# Patient Record
Sex: Male | Born: 1990 | Race: White | Hispanic: No | Marital: Single | Smoking: Current every day smoker
Health system: Southern US, Community
[De-identification: ages and names within clinical notes are randomized; demographics above are authoritative.]

## PROBLEM LIST (undated history)

## (undated) DIAGNOSIS — F419 Anxiety disorder, unspecified: Secondary | ICD-10-CM

## (undated) DIAGNOSIS — F32A Depression, unspecified: Secondary | ICD-10-CM

## (undated) DIAGNOSIS — F329 Major depressive disorder, single episode, unspecified: Secondary | ICD-10-CM

## (undated) DIAGNOSIS — K219 Gastro-esophageal reflux disease without esophagitis: Secondary | ICD-10-CM

## (undated) DIAGNOSIS — F4310 Post-traumatic stress disorder, unspecified: Secondary | ICD-10-CM

## (undated) DIAGNOSIS — T7840XA Allergy, unspecified, initial encounter: Secondary | ICD-10-CM

## (undated) DIAGNOSIS — F319 Bipolar disorder, unspecified: Secondary | ICD-10-CM

## (undated) DIAGNOSIS — K922 Gastrointestinal hemorrhage, unspecified: Secondary | ICD-10-CM

## (undated) DIAGNOSIS — R45851 Suicidal ideations: Secondary | ICD-10-CM

## (undated) HISTORY — DX: Allergy, unspecified, initial encounter: T78.40XA

## (undated) HISTORY — PX: NO PAST SURGERIES: SHX2092

---
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
## 2010-01-13 ENCOUNTER — Inpatient Hospital Stay: Payer: Self-pay | Admitting: Psychiatry

## 2011-04-04 ENCOUNTER — Emergency Department: Payer: Self-pay | Admitting: Emergency Medicine

## 2011-05-02 ENCOUNTER — Inpatient Hospital Stay: Payer: Self-pay | Admitting: Psychiatry

## 2011-11-08 ENCOUNTER — Emergency Department: Payer: Self-pay | Admitting: Emergency Medicine

## 2012-05-02 ENCOUNTER — Emergency Department: Payer: Self-pay | Admitting: Emergency Medicine

## 2012-08-28 ENCOUNTER — Emergency Department: Payer: Self-pay | Admitting: Emergency Medicine

## 2013-01-09 ENCOUNTER — Emergency Department: Payer: Self-pay | Admitting: Emergency Medicine

## 2013-03-14 ENCOUNTER — Emergency Department: Payer: Self-pay | Admitting: Emergency Medicine

## 2013-03-14 LAB — URINALYSIS, COMPLETE
Glucose,UR: NEGATIVE mg/dL (ref 0–75)
Nitrite: NEGATIVE
Ph: 5 (ref 4.5–8.0)
Squamous Epithelial: <1

## 2013-05-22 ENCOUNTER — Emergency Department: Payer: Self-pay | Admitting: Emergency Medicine

## 2013-10-12 ENCOUNTER — Emergency Department: Payer: Self-pay | Admitting: Emergency Medicine

## 2013-10-12 LAB — COMPREHENSIVE METABOLIC PANEL
Albumin: 4.5 g/dL (ref 3.4–5.0)
Alkaline Phosphatase: 103 U/L (ref 50–136)
Anion Gap: 8 (ref 7–16)
BUN: 14 mg/dL (ref 7–18)
Bilirubin,Total: 0.5 mg/dL (ref 0.2–1.0)
Calcium, Total: 9.7 mg/dL (ref 8.5–10.1)
Chloride: 106 mmol/L (ref 98–107)
Co2: 24 mmol/L (ref 21–32)
Creatinine: 1.06 mg/dL (ref 0.60–1.30)
EGFR (Non-African Amer.): >60
SGOT(AST): 19 U/L (ref 15–37)
SGPT (ALT): 19 U/L (ref 12–78)
Total Protein: 8.5 g/dL — ABNORMAL HIGH (ref 6.4–8.2)

## 2013-10-12 LAB — GASTROCCULT (ARMC): Ph, Gastric: >7 (ref 1–3)

## 2013-10-12 LAB — CBC
HCT: 45.4 % (ref 40.0–52.0)
HGB: 15.9 g/dL (ref 13.0–18.0)
MCH: 33 pg (ref 26.0–34.0)
MCH: 33 pg (ref 26.0–34.0)
MCHC: 35 g/dL (ref 32.0–36.0)
Platelet: 176 10*3/uL (ref 150–440)
RBC: 4.81 10*6/uL (ref 4.40–5.90)
RBC: 4.34 10*6/uL — ABNORMAL LOW (ref 4.40–5.90)
RDW: 13.2 % (ref 11.5–14.5)
WBC: 7.4 10*3/uL (ref 3.8–10.6)
WBC: 9.3 10*3/uL (ref 3.8–10.6)

## 2013-10-12 LAB — LIPASE, BLOOD: Lipase: 71 U/L — ABNORMAL LOW (ref 73–393)

## 2013-10-27 ENCOUNTER — Emergency Department: Payer: Self-pay | Admitting: Emergency Medicine

## 2013-10-27 LAB — CBC
HCT: 45.3 % (ref 40.0–52.0)
HGB: 15.7 g/dL (ref 13.0–18.0)
MCH: 32.6 pg (ref 26.0–34.0)
MCHC: 34.7 g/dL (ref 32.0–36.0)
MCV: 94 fL (ref 80–100)
RBC: 4.82 10*6/uL (ref 4.40–5.90)
RDW: 12.6 % (ref 11.5–14.5)

## 2013-10-27 LAB — COMPREHENSIVE METABOLIC PANEL
Albumin: 4.2 g/dL (ref 3.4–5.0)
Alkaline Phosphatase: 102 U/L (ref 50–136)
BUN: 16 mg/dL (ref 7–18)
Bilirubin,Total: 0.3 mg/dL (ref 0.2–1.0)
Co2: 31 mmol/L (ref 21–32)
Creatinine: 1 mg/dL (ref 0.60–1.30)
Glucose: 86 mg/dL (ref 65–99)
Osmolality: 272 (ref 275–301)
Potassium: 3.6 mmol/L (ref 3.5–5.1)
SGOT(AST): 20 U/L (ref 15–37)
SGPT (ALT): 18 U/L (ref 12–78)
Sodium: 136 mmol/L (ref 136–145)
Total Protein: 8.1 g/dL (ref 6.4–8.2)

## 2013-10-27 LAB — LIPASE, BLOOD: Lipase: 101 U/L (ref 73–393)

## 2013-10-28 LAB — URINALYSIS, COMPLETE
Blood: NEGATIVE
Glucose,UR: NEGATIVE mg/dL (ref 0–75)
Leukocyte Esterase: NEGATIVE
Ph: 5 (ref 4.5–8.0)
Protein: 30
RBC,UR: <1 /HPF (ref 0–5)
Specific Gravity: 1.034 (ref 1.003–1.030)
WBC UR: 1 /HPF (ref 0–5)

## 2014-01-05 ENCOUNTER — Emergency Department: Payer: Self-pay | Admitting: Emergency Medicine

## 2014-01-05 LAB — BASIC METABOLIC PANEL
Anion Gap: 3 — ABNORMAL LOW (ref 7–16)
BUN: 16 mg/dL (ref 7–18)
CHLORIDE: 106 mmol/L (ref 98–107)
CO2: 31 mmol/L (ref 21–32)
Calcium, Total: 9.4 mg/dL (ref 8.5–10.1)
Creatinine: 0.98 mg/dL (ref 0.60–1.30)
EGFR (African American): >60
GLUCOSE: 65 mg/dL (ref 65–99)
OSMOLALITY: 279 (ref 275–301)
POTASSIUM: 3.7 mmol/L (ref 3.5–5.1)
Sodium: 140 mmol/L (ref 136–145)

## 2014-01-05 LAB — CBC
HCT: 44.7 % (ref 40.0–52.0)
HGB: 15.1 g/dL (ref 13.0–18.0)
MCH: 31.3 pg (ref 26.0–34.0)
MCHC: 33.7 g/dL (ref 32.0–36.0)
MCV: 93 fL (ref 80–100)
Platelet: 224 10*3/uL (ref 150–440)
RBC: 4.82 10*6/uL (ref 4.40–5.90)
RDW: 13 % (ref 11.5–14.5)
WBC: 5.5 10*3/uL (ref 3.8–10.6)

## 2014-01-05 LAB — MONONUCLEOSIS SCREEN: MONO TEST: NEGATIVE

## 2014-04-02 ENCOUNTER — Encounter (HOSPITAL_COMMUNITY): Payer: Self-pay | Admitting: Emergency Medicine

## 2014-04-02 ENCOUNTER — Emergency Department (HOSPITAL_COMMUNITY)
Admission: EM | Admit: 2014-04-02 | Discharge: 2014-04-02 | Discharge status: Against Medical Advice | Payer: Self-pay | Attending: Emergency Medicine | Admitting: Emergency Medicine

## 2014-04-02 DIAGNOSIS — Z8659 Personal history of other mental and behavioral disorders: Secondary | ICD-10-CM | POA: Insufficient documentation

## 2014-04-02 DIAGNOSIS — F17200 Nicotine dependence, unspecified, uncomplicated: Secondary | ICD-10-CM | POA: Insufficient documentation

## 2014-04-02 DIAGNOSIS — K219 Gastro-esophageal reflux disease without esophagitis: Secondary | ICD-10-CM | POA: Insufficient documentation

## 2014-04-02 DIAGNOSIS — R42 Dizziness and giddiness: Secondary | ICD-10-CM | POA: Insufficient documentation

## 2014-04-02 DIAGNOSIS — Z79899 Other long term (current) drug therapy: Secondary | ICD-10-CM | POA: Insufficient documentation

## 2014-04-02 HISTORY — DX: Anxiety disorder, unspecified: F41.9

## 2014-04-02 HISTORY — DX: Bipolar disorder, unspecified: F31.9

## 2014-04-02 HISTORY — DX: Gastro-esophageal reflux disease without esophagitis: K21.9

## 2014-04-02 HISTORY — DX: Depression, unspecified: F32.A

## 2014-04-02 HISTORY — DX: Gastrointestinal hemorrhage, unspecified: K92.2

## 2014-04-02 HISTORY — DX: Post-traumatic stress disorder, unspecified: F43.10

## 2014-04-02 HISTORY — DX: Major depressive disorder, single episode, unspecified: F32.9

## 2014-04-02 LAB — COMPREHENSIVE METABOLIC PANEL
ALT: 21 U/L (ref 0–53)
AST: 23 U/L (ref 0–37)
Albumin: 4.2 g/dL (ref 3.5–5.2)
Alkaline Phosphatase: 81 U/L (ref 39–117)
BUN: 18 mg/dL (ref 6–23)
CALCIUM: 9.3 mg/dL (ref 8.4–10.5)
CO2: 24 mEq/L (ref 19–32)
CREATININE: 0.92 mg/dL (ref 0.50–1.35)
Chloride: 102 mEq/L (ref 96–112)
GFR calc Af Amer: >90 mL/min (ref >90)
GLUCOSE: 80 mg/dL (ref 70–99)
Potassium: 3.7 mEq/L (ref 3.7–5.3)
Sodium: 140 mEq/L (ref 137–147)
TOTAL PROTEIN: 7.4 g/dL (ref 6.0–8.3)
Total Bilirubin: 0.2 mg/dL — ABNORMAL LOW (ref 0.3–1.2)

## 2014-04-02 LAB — CBC WITH DIFFERENTIAL/PLATELET
Basophils Absolute: 0 10*3/uL (ref 0.0–0.1)
Basophils Relative: 0 % (ref 0–1)
EOS PCT: 3 % (ref 0–5)
Eosinophils Absolute: 0.2 10*3/uL (ref 0.0–0.7)
HEMATOCRIT: 42.3 % (ref 39.0–52.0)
Hemoglobin: 14.6 g/dL (ref 13.0–17.0)
LYMPHS ABS: 1.4 10*3/uL (ref 0.7–4.0)
Lymphocytes Relative: 22 % (ref 12–46)
MCH: 31.5 pg (ref 26.0–34.0)
MCHC: 34.5 g/dL (ref 30.0–36.0)
MCV: 91.2 fL (ref 78.0–100.0)
MONO ABS: 0.7 10*3/uL (ref 0.1–1.0)
Monocytes Relative: 11 % (ref 3–12)
Neutro Abs: 4.2 10*3/uL (ref 1.7–7.7)
Neutrophils Relative %: 64 % (ref 43–77)
PLATELETS: 202 10*3/uL (ref 150–400)
RBC: 4.64 MIL/uL (ref 4.22–5.81)
RDW: 13.1 % (ref 11.5–15.5)
WBC: 6.6 10*3/uL (ref 4.0–10.5)

## 2014-04-02 LAB — I-STAT TROPONIN, ED: Troponin i, poc: 0 ng/mL (ref 0.00–0.08)

## 2014-04-02 LAB — LIPASE, BLOOD: Lipase: 22 U/L (ref 11–59)

## 2014-04-02 MED ORDER — SODIUM CHLORIDE 0.9 % IV BOLUS (SEPSIS): 1000.0000 mL | Freq: Once | INTRAVENOUS | Status: DC

## 2014-04-02 MED ORDER — ONDANSETRON HCL 4 MG PO TABS: 4.0000 mg | ORAL_TABLET | Freq: Four times a day (QID) | ORAL | Status: DC

## 2014-04-02 MED ORDER — ONDANSETRON 4 MG PO TBDP: 4.0000 mg | ORAL_TABLET | Freq: Once | ORAL | Status: DC

## 2014-04-02 MED ORDER — FAMOTIDINE 20 MG PO TABS: 20.0000 mg | ORAL_TABLET | Freq: Two times a day (BID) | ORAL | Status: DC

## 2014-04-02 NOTE — ED Notes (Addendum)
Pt c/o intermittent acid reflux since October 2014 and symptoms have become constant x 4 days.  Pain score 3/10.  Hx of upper and lower GI bleed.  Pt reports that he has been seen multiple times for same at [facility].

## 2014-04-02 NOTE — ED Provider Notes (Signed)
CSN: [id number]     Arrival date & time 04/02/14  1502 History   First MD Initiated Contact with Patient 04/02/14 1512     Chief Complaint  Patient presents with  . Gastrophageal Reflux     (Consider location/radiation/quality/duration/timing/severity/associated sxs/prior Treatment) The history is provided by the patient. No language interpreter was used.  Carlos French is a 23 y/o M with PMhx of abdominal pain, nausea, vomiting that has been ongoing since October 2014 intermittent that has now increased in intensity and frequency over the past couple of weeks. Patient reported that he has been feeling nauseous every morning, mother reported that patient has been vomiting every morning and evening. Patient reported that his emesis is mainly a foam yellowish color. Reported that he has been having a burning sensation in the center of his chest associated after eating. Patient reported that his diet is not the best diet - reported that it mainly consists of foods high in fat, grease, spice, soda (Pepsi). Reported that he is unable to keep down food or fluids. Reported that he has been having abdominal pain intermittently since October 2014 - reported that the discomfort is localized underneath his ribcage. Reported that he has been experiencing a gurgling sensation intermittently. Reported that he was seen and assessed in the ED setting in October of 2014 where he was diagnosed with a upper GI bleed, was discharged and recommended to follow-up with GI as outpatient, but patient does not have insurance. Patient reported that he has been taking Zegerid as needed with minimal relief. Denied blood or bile in the emesis, fever, chills, shortness of breath, difficulty breathing, chest pain, melena, hematochezia.  PCP none  Past Medical History  Diagnosis Date  . GI bleeding   . GERD (gastroesophageal reflux disease)   . Anxiety   . Depression   . PTSD (post-traumatic stress disorder)   . Bipolar 1  disorder    History reviewed. No pertinent past surgical history. History reviewed. No pertinent family history. History  Substance Use Topics  . Smoking status: Current Every Day Smoker -- 0.75 packs/day    Types: Cigarettes  . Smokeless tobacco: Not on file  . Alcohol Use: No    Review of Systems  Constitutional: Negative for fever and chills.  HENT: Negative for trouble swallowing.   Respiratory: Negative for chest tightness and shortness of breath.   Cardiovascular: Positive for chest pain.  Gastrointestinal: Positive for nausea, vomiting and abdominal pain. Negative for diarrhea, constipation, blood in stool and anal bleeding.  Genitourinary: Negative for hematuria and decreased urine volume.  Musculoskeletal: Negative for back pain and neck pain.  Neurological: Positive for dizziness. Negative for syncope, weakness and light-headedness.  All other systems reviewed and are negative.     Allergies  Review of patient's allergies indicates not on file.  Home Medications   Current Outpatient Rx  Name  Route  Sig  Dispense  Refill  . Omeprazole-Sodium Bicarbonate (ZEGERID OTC PO)   Oral   Take 1 capsule by mouth once. Took for acid reflux         . famotidine (PEPCID) 20 MG tablet   Oral   Take 1 tablet (20 mg total) by mouth 2 (two) times daily.   30 tablet   0   . ondansetron (ZOFRAN) 4 MG tablet   Oral   Take 1 tablet (4 mg total) by mouth every 6 (six) hours.   12 tablet   0    BP 118/67  Pulse 86  Temp(Src) 98.6 F (37 C) (Oral)  Resp 18  SpO2 99% Physical Exam  Nursing note and vitals reviewed. Constitutional: He is oriented to person, place, and time. He appears well-developed and well-nourished. No distress.  HENT:  Head: Normocephalic and atraumatic.  Mouth/Throat: Oropharynx is clear and moist. No oropharyngeal exudate.  Eyes: Conjunctivae and EOM are normal. Pupils are equal, round, and reactive to light. Right eye exhibits no discharge. Left  eye exhibits no discharge.  Neck: Normal range of motion. Neck supple.  Cardiovascular: Normal rate, regular rhythm and normal heart sounds.   Pulmonary/Chest: Effort normal and breath sounds normal. No respiratory distress. He has no wheezes. He has no rales.  Abdominal: Soft. Bowel sounds are normal. There is no tenderness. There is no guarding.  Soft upon palpation Negative Murphy's sign Negative McBurney's point  Musculoskeletal: Normal range of motion.  Full ROM to upper and lower extremities without difficulty noted, negative ataxia noted.  Neurological: He is alert and oriented to person, place, and time. No cranial nerve deficit. He exhibits normal muscle tone. Coordination normal.  Skin: Skin is warm and dry. No rash noted. He is not diaphoretic. No erythema.  Psychiatric: He has a normal mood and affect. His behavior is normal. Thought content normal.    ED Course  Procedures (including critical care time)  5:21 PM Patient refused fecal occult stool card to rule out possible lower GI bleed since patient has history of ulcers. Discussed with patient the importance and concern - patient continued to refuse the exam.   5:38 PM This provider was made aware that the patient is refusing IV and CT abdomen and pelvis from nurse.   7:16 PM This provider had a long discussion with patient regarding why labs and imaging were ordered. Discussed concern for possible GI bleed and fatality - patient continued to refuse imaging and exam. Patient to be signed out AMA.   Results for orders placed during the hospital encounter of 04/02/14  CBC WITH DIFFERENTIAL      Result Value Ref Range   WBC 6.6  4.0 - 10.5 K/uL   RBC 4.64  4.22 - 5.81 MIL/uL   Hemoglobin 14.6  13.0 - 17.0 g/dL   HCT 16.1  09.6 - 04.5 %   MCV 91.2  78.0 - 100.0 fL   MCH 31.5  26.0 - 34.0 pg   MCHC 34.5  30.0 - 36.0 g/dL   RDW 40.9  81.1 - 91.4 %   Platelets 202  150 - 400 K/uL   Neutrophils Relative % 64  43 - 77 %    Neutro Abs 4.2  1.7 - 7.7 K/uL   Lymphocytes Relative 22  12 - 46 %   Lymphs Abs 1.4  0.7 - 4.0 K/uL   Monocytes Relative 11  3 - 12 %   Monocytes Absolute 0.7  0.1 - 1.0 K/uL   Eosinophils Relative 3  0 - 5 %   Eosinophils Absolute 0.2  0.0 - 0.7 K/uL   Basophils Relative 0  0 - 1 %   Basophils Absolute 0.0  0.0 - 0.1 K/uL  COMPREHENSIVE METABOLIC PANEL      Result Value Ref Range   Sodium 140  137 - 147 mEq/L   Potassium 3.7  3.7 - 5.3 mEq/L   Chloride 102  96 - 112 mEq/L   CO2 24  19 - 32 mEq/L   Glucose, Bld 80  70 - 99 mg/dL   BUN  18  6 - 23 mg/dL   Creatinine, Ser 1.61  0.50 - 1.35 mg/dL   Calcium 9.3  8.4 - 09.6 mg/dL   Total Protein 7.4  6.0 - 8.3 g/dL   Albumin 4.2  3.5 - 5.2 g/dL   AST 23  0 - 37 U/L   ALT 21  0 - 53 U/L   Alkaline Phosphatase 81  39 - 117 U/L   Total Bilirubin 0.2 (*) 0.3 - 1.2 mg/dL   GFR calc non Af Amer >90  >90 mL/min   GFR calc Af Amer >90  >90 mL/min  LIPASE, BLOOD      Result Value Ref Range   Lipase 22  11 - 59 U/L  I-STAT TROPOININ, ED      Result Value Ref Range   Troponin i, poc 0.00  0.00 - 0.08 ng/mL   Comment 3             Labs Review Labs Reviewed  COMPREHENSIVE METABOLIC PANEL - Abnormal; Notable for the following:    Total Bilirubin 0.2 (*)    All other components within normal limits  CBC WITH DIFFERENTIAL  LIPASE, BLOOD  URINALYSIS, ROUTINE W REFLEX MICROSCOPIC  OCCULT BLOOD X 1 CARD TO LAB, STOOL  I-STAT TROPOININ, ED   Imaging Review No results found.   EKG Interpretation   Date/Time:  Thursday April 02 2014 16:51:56 EDT Ventricular Rate:  74 PR Interval:  152 QRS Duration: 106 QT Interval:  366 QTC Calculation: 406 R Axis:   74 Text Interpretation:  Sinus rhythm Non-specific ST-t changes No old  tracing to compare Confirmed by KOHUT  MD, STEPHEN [id number]) on 04/02/2014  4:59:24 PM      MDM   Final diagnoses:  GERD (gastroesophageal reflux disease)   Medications  sodium chloride 0.9 % bolus 1,000 mL  (1,000 mLs Intravenous Not Given 04/02/14 1737)  ondansetron (ZOFRAN-ODT) disintegrating tablet 4 mg (4 mg Oral Not Given 04/02/14 1803)   Filed Vitals:   04/02/14 1514 04/02/14 1829 04/02/14 1831  BP: 113/61 105/48 118/67  Pulse: 100 86   Temp: 98.2 F (36.8 C) 98.6 F (37 C)   TempSrc: Oral Oral   Resp: 20 18   SpO2: 100% 99%     Patient presenting to the ED with GERD like symptoms that have been ongoing since October 2014 - reported that he was seen and assessed in the ED setting at this time when he was diagnosed with upper GI bleed and due to follow up as an outpatient with GI, but has no insurance. Reported that he has been having nausea and emesis every morning. Reported a burning sensation to the center of the chest after eating and emesis. Reported that his diet is mainly consistent of soda, spicy and greasy foods. Has been taking Zegerid as needed.  Alert and oriented. GCS 15. Heart rate and rhythm normal. Lungs clear to auscultation to upper and lower lobes. DP and radial 2+. Abdomen soft, nontender - benign abdominal exam.  Patient refused rectal exam to rule for fecal occult test. Patient refused CT abdomen and pelvis as well as IV.  EKG noted sinus rhythm with non specific ST segment changes. Troponin negative. CBC negative drop in Hgb noted - negative elevated white blood cell count noted. CMP negative findings-kidney and liver function well. Lipase negative elevation. This provider received numerous phone calls from nurse regarding the patient refused rectal exam, CT abdomen pelvis with contrast, Zofran by mouth, IV. This provider  had a long discussion with the patient regarding why imaging and exam was to be performed - regarding since patient has history of GI bleed - discussed dangers and consequences, patient continued to refuse the exam and imaging. Patient refused IV saline and zofran. Patient reported that he would like to go home. Patient to sign out AMA. Doubt acute bleed.  Doubt cardiac issue - PERC score low, doubt PE. Doubt acute abdominal processes. This issue has been ongoing for a long time - since October 2014 where patient was seen and assessed in [place] and referred to GI. Suspicion to be GERD with poor control. This provider prescribed patient PPI and zofran. Discussed with patient proper diet and for patient to follow-up with GI and outpatient PCP. Discussed with patient to drink plenty of water. Patient stable, afebrile-nontoxic appearing. Discussed with patient to closely monitor symptoms and if symptoms are to worsen or change to report back to the ED - strict return instructions given.  Patient agreed to plan of care, understood, all questions answered.   Raymon MuttonMarissa Presley [place], PA-C 04/03/14 0403  Raymon MuttonMarissa Bassel [place]ask[place], PA-C 04/03/14 1132

## 2014-04-02 NOTE — ED Notes (Signed)
Pt denies pain at present time. Pt reports intermittent pain to epigastric area.

## 2014-04-02 NOTE — ED Notes (Signed)
Pt refuses to give urine. States he is "done with this," and he wants to leave.

## 2014-04-02 NOTE — Discharge Instructions (Signed)
Please call and set up appointments with health and wellness Center, as well as a condition nephrologist regarding ongoing issue of GERD Please rest and stay hydrated-please drink plenty of water Please take medications as prescribed Please avoid spicy foods, alcohol, greasy foods, fatty foods for this can worsen symptoms Please continue to monitor symptoms closely and if symptoms are to worsen or change (fever greater than 101, chills, nausea, vomiting, chest pain, shortness of breath, difficulty breathing, numbness, tingling, worsening abdominal pain, blood in stools, black for stools, blood in the vomit, clots) please report back to the ED immediately   Diet for Gastroesophageal Reflux Disease, Adult Reflux (acid reflux) is when acid from your stomach flows up into the esophagus. When acid comes in contact with the esophagus, the acid causes irritation and soreness (inflammation) in the esophagus. When reflux happens often or so severely that it causes damage to the esophagus, it is called gastroesophageal reflux disease (GERD). Nutrition therapy can help ease the discomfort of GERD. FOODS OR DRINKS TO AVOID OR LIMIT  Smoking or chewing tobacco. Nicotine is one of the most potent stimulants to acid production in the gastrointestinal tract.  Caffeinated and decaffeinated coffee and black tea.  Regular or low-calorie carbonated beverages or energy drinks (caffeine-free carbonated beverages are allowed).   Strong spices, such as black pepper, white pepper, red pepper, cayenne, curry powder, and chili powder.  Peppermint or spearmint.  Chocolate.  High-fat foods, including meats and fried foods. Extra added fats including oils, butter, salad dressings, and nuts. Limit these to less than 8 tsp per day.  Fruits and vegetables if they are not tolerated, such as citrus fruits or tomatoes.  Alcohol.  Any food that seems to aggravate your condition. If you have questions regarding your diet,  call your caregiver or a registered dietitian. OTHER THINGS THAT MAY HELP GERD INCLUDE:   Eating your meals slowly, in a relaxed setting.  Eating 5 to 6 small meals per day instead of 3 large meals.  Eliminating food for a period of time if it causes distress.  Not lying down until 3 hours after eating a meal.  Keeping the head of your bed raised 6 to 9 inches (15 to 23 cm) by using a foam wedge or blocks under the legs of the bed. Lying flat may make symptoms worse.  Being physically active. Weight loss may be helpful in reducing reflux in overweight or obese adults.  Wear loose fitting clothing EXAMPLE MEAL PLAN This meal plan is approximately 2,000 calories based on https://www.bernard.org/ChooseMyPlate.gov meal planning guidelines. Breakfast   cup cooked oatmeal.  1 cup strawberries.  1 cup low-fat milk.  1 oz almonds. Snack  1 cup cucumber slices.  6 oz yogurt (made from low-fat or fat-free milk). Lunch  2 slice whole-wheat bread.  2 oz sliced [place]urkey.  2 tsp mayonnaise.  1 cup blueberries.  1 cup snap peas. Snack  6 whole-wheat crackers.  1 oz string cheese. Dinner   cup brown rice.  1 cup mixed veggies.  1 tsp olive oil.  3 oz grilled fish. Document Released: 12/11/2005 Document Revised: 03/04/2012 Document Reviewed: 10/27/2011 [facility] Patient Information 2014 [place], [place].  Gastroesophageal Reflux Disease, Adult Gastroesophageal reflux disease (GERD) happens when acid from your stomach flows up into the esophagus. When acid comes in contact with the esophagus, the acid causes soreness (inflammation) in the esophagus. Over time, GERD may create small holes (ulcers) in the lining of the esophagus. CAUSES   Increased body weight.  This puts pressure on the stomach, making acid rise from the stomach into the esophagus.  Smoking. This increases acid production in the stomach.  Drinking alcohol. This causes decreased pressure in the lower esophageal sphincter (valve  or ring of muscle between the esophagus and stomach), allowing acid from the stomach into the esophagus.  Late evening meals and a full stomach. This increases pressure and acid production in the stomach.  A malformed lower esophageal sphincter. Sometimes, no cause is found. SYMPTOMS   Burning pain in the lower part of the mid-chest behind the breastbone and in the mid-stomach area. This may occur twice a week or more often.  Trouble swallowing.  Sore throat.  Dry cough.  Asthma-like symptoms including chest tightness, shortness of breath, or wheezing. DIAGNOSIS  Your caregiver may be able to diagnose GERD based on your symptoms. In some cases, X-rays and other tests may be done to check for complications or to check the condition of your stomach and esophagus. TREATMENT  Your caregiver may recommend over-the-counter or prescription medicines to help decrease acid production. Ask your caregiver before starting or adding any new medicines.  HOME CARE INSTRUCTIONS   Change the factors that you can control. Ask your caregiver for guidance concerning weight loss, quitting smoking, and alcohol consumption.  Avoid foods and drinks that make your symptoms worse, such as:  Caffeine or alcoholic drinks.  Chocolate.  Peppermint or mint flavorings.  Garlic and onions.  Spicy foods.  Citrus fruits, such as oranges, lemons, or limes.  Tomato-based foods such as sauce, chili, salsa, and pizza.  Fried and fatty foods.  Avoid lying down for the 3 hours prior to your bedtime or prior to taking a nap.  Eat small, frequent meals instead of large meals.  Wear loose-fitting clothing. Do not wear anything tight around your waist that causes pressure on your stomach.  Raise the head of your bed 6 to 8 inches with wood blocks to help you sleep. Extra pillows will not help.  Only take over-the-counter or prescription medicines for pain, discomfort, or fever as directed by your  caregiver.  Do not take aspirin, ibuprofen, or other nonsteroidal anti-inflammatory drugs (NSAIDs). SEEK IMMEDIATE MEDICAL CARE IF:   You have pain in your arms, neck, jaw, teeth, or back.  Your pain increases or changes in intensity or duration.  You develop nausea, vomiting, or sweating (diaphoresis).  You develop shortness of breath, or you faint.  Your vomit is green, yellow, black, or looks like coffee grounds or blood.  Your stool is red, bloody, or black. These symptoms could be signs of other problems, such as heart disease, gastric bleeding, or esophageal bleeding. MAKE SURE YOU:   Understand these instructions.  Will watch your condition.  Will get help right away if you are not doing well or get worse. Document Released: 09/20/2005 Document Revised: 03/04/2012 Document Reviewed: 06/30/2011 [facility] Patient Information 2014 [place], [place].   Emergency Department Resource Guide 1) Find a Doctor and Pay Out of Pocket Although you won't have to find out who is covered by your insurance plan, it is a good idea to ask around and get recommendations. You will then need to call the office and see if the doctor you have chosen will accept you as a new patient and what types of options they offer for patients who are self-pay. Some doctors offer discounts or will set up payment plans for their patients who do not have insurance, but you will need to ask  so you aren't surprised when you get to your appointment.  2) Contact Your Local Health Department Not all health departments have doctors that can see patients for sick visits, but many do, so it is worth a call to see if yours does. If you don't know where your local health department is, you can check in your phone book. The CDC also has a tool to help you locate your state's health department, and many state websites also have listings of all of their local health departments.  3) Find a [facility] If your illness is not  likely to be very severe or complicated, you may want to try a walk in clinic. These are popping up all over the country in pharmacies, drugstores, and shopping centers. They're usually staffed by nurse practitioners or physician assistants that have been trained to treat common illnesses and complaints. They're usually fairly quick and inexpensive. However, if you have serious medical issues or chronic medical problems, these are probably not your best option.  No Primary Care Doctor: - Call Health Connect at  [phone] - they can help you locate a primary care doctor that  accepts your insurance, provides certain services, etc. - Physician Referral Service- [phone]  Chronic Pain Problems: Organization         Address  Phone   Notes  [facility]  [phone] Patients need to be referred by their primary care doctor.   Medication Assistance: Organization         Address  Phone   Notes  [facility] Medication [facility] [address] [place], [place] [address] [phone] --Must be a resident of [facility] -- Must have NO insurance coverage whatsoever (no Medicaid/ Medicare, etc.) -- The pt. MUST have a primary care doctor that directs their care regularly and follows them in the community   [facility]ist  [phone]   [employer]  [phone]    Agencies that provide inexpensive medical care: Organization         Address  Phone   Notes  [place]  [phone]   [facility]    [phone]   [facility] [place], [place] [address] [phone]   [place] [address], [place] [phone]   Planned Parenthood    [phone]   [facility]    [phone]   [place] and [place] [address], [place] Phone:  [phone], Fax:  [phone] Hours of Operation:  9 am - 6 pm,  M-F.  Also accepts Medicaid/Medicare and self-pay.  [facility] for [place] [address], [address], [place] Phone: [phone], Fax: [phone]. Hours of Operation:  8:30 am - 5:30 pm, M-F.  Also accepts Medicaid and self-pay.  [facility] [address], [place] Phone: [phone]   [place], [place], [place] [phone], Ext. [phone] Mondays & Thursdays: 7-9 AM.  First 15 patients are seen on a first come, first serve basis.    [place] Providers:  Organization         Address  Phone   Notes  [facility] [address], [place] [phone] Also accepts self-pay patients.  [place], [place], [place]  [phone]  [place], [address], [place] [phone]   [place] [address] [place] [phone]   Lucianne Lei [address], [place]   [phone] Only accepts [place] Access [place] patients after they have their name applied to their card.   Self-Pay (no insurance) in [facility]:  Organization         Address  Phone   Notes  Sickle Cell Patients, [facility] [place] [phone]   [facility] [place] [phone]   [facility] [place]  [place], [place], [place] [phone]   [facility]/Dr. Osei-Bonsu  [address] [place] or [address], [place] [phone] Phone number for both [place] and [place] locations is the same.  [facility] [address], [place] [phone]   [facility] [address] [place] or [address] [phone]   [facility] [address], [place] [phone], phone; [phone], fax Sees patients 1st and 3rd Saturday of every month.  Must not qualify for public or private insurance (i.e. Medicaid, Medicare, [place] Health Choice, Veterans' Benefits)  Household income should be no more than 200% of the poverty level The clinic cannot treat you if you are pregnant or think you are pregnant  Sexually transmitted diseases are not treated at the clinic.    Dental Care: Organization         Address  Phone  Notes  [facility] Department of [facility] [place] [phone] Accepts children up to age 72 who are enrolled in [place] or [place]; pregnant women with a Medicaid card; and children who have applied for Medicaid or [place] Health Choice, but were declined, whose parents can pay a reduced fee at time of service.  [facility] Department of [facility]  [address], [place] [phone] Accepts children up to age 28 who are enrolled in [place] or [place]; pregnant women with a Medicaid card; and children who have applied for Medicaid or [place] Health Choice, but were declined, whose parents can pay a reduced fee at time of service.  [place] Adult Dental Access PROGRAM  [place] [phone] Patients are seen by appointment only. Walk-ins are not accepted. [place] will see patients 54 years of age and older. Monday - Tuesday (8am-5pm) Most Wednesdays (8:30-5pm) $30 per visit, cash only  [facility] Adult Dental Access PROGRAM  [address], [place] [phone] Patients are seen by appointment only. Walk-ins are not accepted. [place] will see patients 49 years of age and older. One Wednesday Evening (Monthly: Volunteer Based).  $30 per visit, cash only  [place]  [phone] for adults; Children under age 69, call Graduate Pediatric Dentistry at [phone]. Children aged 5-14, please call [phone] to request a pediatric application.  Dental services are provided in all areas of dental care including fillings, crowns and bridges, complete and partial dentures, implants, gum treatment, root canals, and extractions. Preventive care is also provided. Treatment is provided to both adults and children. Patients are selected via a lottery and there is often a waiting list.  [facility] [address] [place]  [phone] [email or web address]   [facility] [address] [place], [place] [phone], Ext. [phone] Second and Fourth Thursday of each month, opens at 6:30 AM; Clinic ends at 9 AM.  Patients are seen on a first-come first-served basis, and a limited number are seen during each clinic.   [facility]  [address] Hillard Danker [place], [place] [phone]   Eligibility Requirements You must have lived in [place], [place], or [place] counties for at least the last three months.   You cannot be eligible for state or federal sponsored [employer], including [employer], [place], or [employer].   You generally cannot be eligible for healthcare insurance through your employer.    How to apply: Eligibility screenings are held every Tuesday and Wednesday afternoon from 1:00 pm until 4:00 pm. You do not need an appointment for the interview!  [facility] [address], [place], [place]   [place] Department  [place]  [phone]    Behavioral Health Resources in the Community: Intensive Outpatient Programs Organization         Address  Phone  Notes  Oliver Preston. [address] [place], [place] [phone]   [facility] Outpatient [address], [place], [place]   ADS: Alcohol & Drug Svcs  [address] [place], [place]   [place] [address]  [place], [place] or [phone]   Substance Abuse Resources Organization         Address  Phone  Notes  Al[facility] Drug Services  [phone]   [place]  [phone]   [facility]   Chinita Pester  [phone]   Residential & Outpatient Substance Abuse Program  3207775686   Psychological Services Organization         Address  Phone  Notes  [facility]  [place]  [phone]   [place] [address] [place] or [phone]    Mobile Crisis Teams Organization         Address  Phone  Notes  [facility], Mobile Crisis Care Unit  [phone]   [facility]  [address] [employer], [place]   [place] [address], [place] [phone]    Self-Help/Support Groups Organization         Address  Phone             Notes  [place]. of [place] - variety of support groups  [place] Call for more information  Narcotics Anonymous (NA), [facility] [address], [employer] [place]  2 meetings at this location   Special educational needs teacher         Address  Phone  Notes  [facility] [place],    [place]  [phone]   [facility]  [address] [place] [id number], [place], [place]   Morgan Mazie, [place] [phone] Admissions: 8am-3pm M-F  Incentives Substance [facility] [address],    [place], [place] [phone]   [facility] [address] [place], [place], [place]   The [place].,  [facility] [address]  °[place], [place] [phone]   °[facility] - Intensive Outpatient [address], [place], [place]  [phone]   °[facility] [address]  °[place], [place] [phone] or [phone]   °[facility] ([facility]) [address] [place], [place] [phone] Accepts Medicaid  °[facility] [address]  °[place] 1-800-659-3381 Substance Abuse/Addiction Treatment  ° °[place] Behavioral Health Resources °Organization         Address  Phone  Notes  °[facility]  ([phone]) [phone]   °Julie Brannon, PhD [address] [place], [place]   ([phone]) [phone] or (336) [phone]   °[facility]   [address] °[place], [place] ([phone]) [phone]   °[facility] [address], [place], [place] ([phone]) [phone] Insurance/Medicaid/sponsorship through [facility]  °[facility] [address]                                    [place], [place] ([phone]) [phone] Therapy/tele-psych/case  °[facility] [address]  ° [place], [place] ([phone]) [phone]    °Dr. Arfeen  ([phone]) [phone]   °[facility] [place]  [employer] [place] [facility] 1) [address], [place] °2) [address], [place] °3)  [address], [place] ([phone]) [phone] °(336) [phone] ° °(336) [phone]   °[place] Child Abuse Hotline (336) 342-1394 or (336) [phone] (After Hours)    ° ° ° °

## 2014-04-02 NOTE — ED Notes (Signed)
Per Marissa PA pt refused rectal exam and occult blood card test. Pt explained risks of not performing test.

## 2014-04-02 NOTE — ED Notes (Addendum)
Pt states "they did all of this last time I was seen. I have had this done two or three times. They found nothing. I am not doing all of this again." Pt denies nausea at present time and refuses zofran. Pt states "they gave this to me last time. It did not work." NVR IncMarissa PA aware of all of the above.

## 2014-04-02 NOTE — ED Notes (Signed)
Pt refused IV and fluids. Marissa PA made aware.

## 2014-04-03 NOTE — ED Provider Notes (Signed)
Medical screening examination/treatment/procedure(s) were performed by non-physician practitioner and as supervising physician I was immediately available for consultation/collaboration.   EKG Interpretation   Date/Time:  Thursday April 02 2014 16:51:56 EDT Ventricular Rate:  74 PR Interval:  152 QRS Duration: 106 QT Interval:  366 QTC Calculation: 406 R Axis:   74 Text Interpretation:  Sinus rhythm Non-specific ST-t changes No old  tracing to compare Confirmed by Juleen ChinaKOHUT  MD, Armondo Cech ([id number]) on 04/02/2014  4:59:24 PM       Raeford RazorStephen Shambria Camerer, MD 04/03/14 [phone]

## 2014-07-14 ENCOUNTER — Ambulatory Visit (INDEPENDENT_AMBULATORY_CARE_PROVIDER_SITE_OTHER): Payer: BC Managed Care – PPO | Admitting: Family Medicine

## 2014-07-14 VITALS — BP 104/66 | HR 90 | Temp 98.7°F | Resp 16 | Ht 65.5 in | Wt 110.0 lb

## 2014-07-14 DIAGNOSIS — K2941 Chronic atrophic gastritis with bleeding: Secondary | ICD-10-CM

## 2014-07-14 DIAGNOSIS — K2951 Unspecified chronic gastritis with bleeding: Secondary | ICD-10-CM

## 2014-07-14 DIAGNOSIS — R1013 Epigastric pain: Secondary | ICD-10-CM

## 2014-07-14 LAB — POCT CBC
Granulocyte percent: 73.4 %G (ref 37–80)
HCT, POC: 51.4 % (ref 43.5–53.7)
Hemoglobin: 16.4 g/dL (ref 14.1–18.1)
LYMPH, POC: 2.1 (ref 0.6–3.4)
MCH, POC: 30.8 pg (ref 27–31.2)
MCHC: 31.8 g/dL (ref 31.8–35.4)
MCV: 96.7 fL (ref 80–97)
MID (cbc): 0.4 (ref 0–0.9)
MPV: 7.3 fL (ref 0–99.8)
POC Granulocyte: 6.8 (ref 2–6.9)
POC LYMPH %: 22.2 % (ref 10–50)
POC MID %: 4.4 %M (ref 0–12)
Platelet Count, POC: 246 10*3/uL (ref 142–424)
RBC: 5.32 M/uL (ref 4.69–6.13)
RDW, POC: 13.9 %
WBC: 9.3 10*3/uL (ref 4.6–10.2)

## 2014-07-14 MED ORDER — ONDANSETRON 4 MG PO TBDP
8.0000 mg | ORAL_TABLET | Freq: Once | ORAL | Status: AC
  Administered 2014-07-14: 8 mg via ORAL

## 2014-07-14 MED ORDER — SUCRALFATE 1 G PO TABS: 1.0000 g | ORAL_TABLET | Freq: Three times a day (TID) | ORAL | Status: DC

## 2014-07-14 MED ORDER — OMEPRAZOLE 40 MG PO CPDR: 40.0000 mg | DELAYED_RELEASE_CAPSULE | Freq: Every day | ORAL | Status: DC

## 2014-07-14 NOTE — Patient Instructions (Signed)
Gastritis, Adult °Gastritis is soreness and swelling (inflammation) of the lining of the stomach. Gastritis can develop as a sudden onset (acute) or long-term (chronic) condition. If gastritis is not treated, it can lead to stomach bleeding and ulcers. °CAUSES  °Gastritis occurs when the stomach lining is weak or damaged. Digestive juices from the stomach then inflame the weakened stomach lining. The stomach lining may be weak or damaged due to viral or bacterial infections. One common bacterial infection is the Helicobacter pylori infection. Gastritis can also result from excessive alcohol consumption, taking certain medicines, or having too much acid in the stomach.  °SYMPTOMS  °In some cases, there are no symptoms. When symptoms are present, they may include: °· Pain or a burning sensation in the upper abdomen. °· Nausea. °· Vomiting. °· An uncomfortable feeling of fullness after eating. °DIAGNOSIS  °Your caregiver may suspect you have gastritis based on your symptoms and a physical exam. To determine the cause of your gastritis, your caregiver may perform the following: °· Blood or stool tests to check for the H pylori bacterium. °· Gastroscopy. A thin, flexible tube (endoscope) is passed down the esophagus and into the stomach. The endoscope has a light and camera on the end. Your caregiver uses the endoscope to view the inside of the stomach. °· Taking a tissue sample (biopsy) from the stomach to examine under a microscope. °TREATMENT  °Depending on the cause of your gastritis, medicines may be prescribed. If you have a bacterial infection, such as an H pylori infection, antibiotics may be given. If your gastritis is caused by too much acid in the stomach, H2 blockers or antacids may be given. Your caregiver may recommend that you stop taking aspirin, ibuprofen, or other nonsteroidal anti-inflammatory drugs (NSAIDs). °HOME CARE INSTRUCTIONS °· Only take over-the-counter or prescription medicines as directed by  your caregiver. °· If you were given antibiotic medicines, take them as directed. Finish them even if you start to feel better. °· Drink enough fluids to keep your urine clear or pale yellow. °· Avoid foods and drinks that make your symptoms worse, such as: °¨ Caffeine or alcoholic drinks. °¨ Chocolate. °¨ Peppermint or mint flavorings. °¨ Garlic and onions. °¨ Spicy foods. °¨ Citrus fruits, such as oranges, lemons, or limes. °¨ Tomato-based foods such as sauce, chili, salsa, and pizza. °¨ Fried and fatty foods. °· Eat small, frequent meals instead of large meals. °SEEK IMMEDIATE MEDICAL CARE IF:  °· You have black or dark red stools. °· You vomit blood or material that looks like coffee grounds. °· You are unable to keep fluids down. °· Your abdominal pain gets worse. °· You have a fever. °· You do not feel better after 1 week. °· You have any other questions or concerns. °MAKE SURE YOU: °· Understand these instructions. °· Will watch your condition. °· Will get help right away if you are not doing well or get worse. °Document Released: 12/05/2001 Document Revised: 06/11/2012 Document Reviewed: 01/24/2012 °ExitCare® Patient Information ©2015 [employer]. This information is not intended to replace advice given to you by your health care provider. Make sure you discuss any questions you have with your health care provider. ° °Food Choices for Gastroesophageal Reflux Disease °When you have gastroesophageal reflux disease (GERD), the foods you eat and your eating habits are very important. Choosing the right foods can help ease the discomfort of GERD. °WHAT GENERAL GUIDELINES DO I NEED TO FOLLOW? °· Choose fruits, vegetables, whole grains, low-fat dairy products, and low-fat   fish, and poultry.  Limit fats such as oils, salad dressings, butter, nuts, and avocado.  Keep a food diary to identify foods that cause symptoms.  Avoid foods that cause reflux. These may be different for different people.  Eat  frequent small meals instead of three large meals each day.  Eat your meals slowly, in a relaxed setting.  Limit fried foods.  Cook foods using methods other than frying.  Avoid drinking alcohol.  Avoid drinking large amounts of liquids with your meals.  Avoid bending over or lying down until 2-3 hours after eating. WHAT FOODS ARE NOT RECOMMENDED? The following are some foods and drinks that may worsen your symptoms: Vegetables Tomatoes. Tomato juice. Tomato and spaghetti sauce. Chili peppers. Onion and garlic. Horseradish. Fruits Oranges, grapefruit, and lemon (fruit and juice). Meats High-fat meats, fish, and poultry. This includes hot dogs, ribs, ham, sausage, salami, and bacon. Dairy Whole milk and chocolate milk. Sour cream. Cream. Butter. Ice cream. Cream cheese.  Beverages Coffee and tea, with or without caffeine. Carbonated beverages or energy drinks. Condiments Hot sauce. Barbecue sauce.  Sweets/Desserts Chocolate and cocoa. Donuts. Peppermint and spearmint. Fats and Oils High-fat foods, including [place] fries and potato chips. Other Vinegar. Strong spices, such as black pepper, white pepper, red pepper, cayenne, curry powder, cloves, ginger, and chili powder. The items listed above may not be a complete list of foods and beverages to avoid. Contact your dietitian for more information. Document Released: 12/11/2005 Document Revised: 12/16/2013 Document Reviewed: 10/15/2013 [facility] Patient Information 2015 [place] [place]. This information is not intended to replace advice given to you by your health care provider. Make sure you discuss any questions you have with your health care provider. Gastroesophageal Reflux Disease, Adult Gastroesophageal reflux disease (GERD) happens when acid from your stomach flows up into the esophagus. When acid comes in contact with the esophagus, the acid causes soreness (inflammation) in the esophagus. Over time, GERD may create small  holes (ulcers) in the lining of the esophagus. CAUSES   Increased body weight. This puts pressure on the stomach, making acid rise from the stomach into the esophagus.  Smoking. This increases acid production in the stomach.  Drinking alcohol. This causes decreased pressure in the lower esophageal sphincter (valve or ring of muscle between the esophagus and stomach), allowing acid from the stomach into the esophagus.  Late evening meals and a full stomach. This increases pressure and acid production in the stomach.  A malformed lower esophageal sphincter. Sometimes, no cause is found. SYMPTOMS   Burning pain in the lower part of the mid-chest behind the breastbone and in the mid-stomach area. This may occur twice a week or more often.  Trouble swallowing.  Sore throat.  Dry cough.  Asthma-like symptoms including chest tightness, shortness of breath, or wheezing. DIAGNOSIS  Your caregiver may be able to diagnose GERD based on your symptoms. In some cases, X-rays and other tests may be done to check for complications or to check the condition of your stomach and esophagus. TREATMENT  Your caregiver may recommend over-the-counter or prescription medicines to help decrease acid production. Ask your caregiver before starting or adding any new medicines.  HOME CARE INSTRUCTIONS   Change the factors that you can control. Ask your caregiver for guidance concerning weight loss, quitting smoking, and alcohol consumption.  Avoid foods and drinks that make your symptoms worse, such as:  Caffeine or alcoholic drinks.  Chocolate.  Peppermint or mint flavorings.  Garlic and onions.  Spicy foods.  Spicy foods. °¨ Citrus fruits, such as oranges, lemons, or limes. °¨ Tomato-based foods such as sauce, chili, salsa, and pizza. °¨ Fried and fatty foods. °· Avoid lying down for the 3 hours prior to your bedtime or prior to taking a nap. °· Eat small, frequent meals instead of large meals. °· Wear loose-fitting  clothing. Do not wear anything tight around your waist that causes pressure on your stomach. °· Raise the head of your bed 6 to 8 inches with wood blocks to help you sleep. Extra pillows will not help. °· Only take over-the-counter or prescription medicines for pain, discomfort, or fever as directed by your caregiver. °· Do not take aspirin, ibuprofen, or other nonsteroidal anti-inflammatory drugs (NSAIDs). °SEEK IMMEDIATE MEDICAL CARE IF:  °· You have pain in your arms, neck, jaw, teeth, or back. °· Your pain increases or changes in intensity or duration. °· You develop nausea, vomiting, or sweating (diaphoresis). °· You develop shortness of breath, or you faint. °· Your vomit is green, yellow, black, or looks like coffee grounds or blood. °· Your stool is red, bloody, or black. °These symptoms could be signs of other problems, such as heart disease, gastric bleeding, or esophageal bleeding. °MAKE SURE YOU:  °· Understand these instructions. °· Will watch your condition. °· Will get help right away if you are not doing well or get worse. °Document Released: 09/20/2005 Document Revised: 03/04/2012 Document Reviewed: 06/30/2011 °ExitCare® Patient Information ©2015 [employer]. This information is not intended to replace advice given to you by your health care provider. Make sure you discuss any questions you have with your health care provider. ° °

## 2014-07-15 LAB — COMPREHENSIVE METABOLIC PANEL
ALT: 12 U/L (ref 0–53)
AST: 17 U/L (ref 0–37)
Albumin: 5.1 g/dL (ref 3.5–5.2)
Alkaline Phosphatase: 73 U/L (ref 39–117)
BILIRUBIN TOTAL: 0.7 mg/dL (ref 0.2–1.2)
BUN: 11 mg/dL (ref 6–23)
CALCIUM: 9.7 mg/dL (ref 8.4–10.5)
CO2: 26 meq/L (ref 19–32)
CREATININE: 0.91 mg/dL (ref 0.50–1.35)
Chloride: 102 mEq/L (ref 96–112)
Glucose, Bld: 83 mg/dL (ref 70–99)
Potassium: 4.5 mEq/L (ref 3.5–5.3)
Sodium: 137 mEq/L (ref 135–145)
TOTAL PROTEIN: 7.7 g/dL (ref 6.0–8.3)

## 2014-07-15 LAB — H. PYLORI ANTIBODY, IGG: H Pylori IgG: <0.4 {ISR}

## 2014-07-15 LAB — LIPASE: LIPASE: 16 U/L (ref 0–75)

## 2014-07-17 ENCOUNTER — Encounter: Payer: Self-pay | Admitting: Family Medicine

## 2014-07-17 NOTE — Progress Notes (Signed)
Subjective:    Patient ID: Carlos French, male    DOB: 13-Jul-1991, 23 y.o.   MRN: [id number] This chart was scribed for Sherren Mocha, MD by Julian Hy, ED Scribe. The patient was seen in Room 10. The patient's care was started at 4:03 PM.   Chief Complaint  Patient presents with  . GI Problem    since October 2014--pt states feels like knots all over the abdominal--vomiting mostly in the morning    GI Problem The primary symptoms include abdominal pain (left epigastric), nausea and vomiting. Primary symptoms do not include fever or diarrhea.  The illness does not include chills or constipation.   HPI Comments: Carlos French is a 23 y.o. male who presents to the [facility] complaining of chronic, indigestion onset October 2014. Pt also states he has been having moderate pain in the left epigastrium. Pt states he has bile-like vomit, with yellow and white mucus. He denies his vomit has blood or looks like coffee-grounds. Pt states he has had normal bowel movements. He denies black stools or occult blood in stools. Pt states he has been taking indigestion medicine with moderate relief. Pt states he's previously taken Pepcid, zantac, zegrid, Zofran, and generic acid-reducing medicine.   Pt states his bleeding has reduced since October. Pt states he just got insurance and wants a referral for an endoscopy. Pt states he has stress at home and stress at work. Pt denies he's ever taken antibiotics to relieve symptoms. Pt states he eats a fair amount of fast-foods. Pt states he denies eating spicy foods due to his sxs.   Pt seen for similar problems in the ED in April 2015. He has a history of stomach ulcers, he had a normal CBC, CMP, and lipase. Negative troponin. Left AMA and refused a rectal exam. He had been taking Zegerid as needed. Pt states he has a history of a GI bleed in October 2014. Pt denies having a blood transfusion, but his hemoglobin dropped 2 counts and his blood  count rose in the morning. Pt denies having an endoscopy after  He was prescribed a PPI and Zofran. Reviewed proper diet for GERD and recommended follow-up outpatient.   Review of Systems  Constitutional: Negative for fever, chills, activity change, appetite change and unexpected weight change.  Respiratory: Negative for cough, chest tightness and shortness of breath.   Cardiovascular: Negative for chest pain, palpitations and leg swelling.  Gastrointestinal: Positive for nausea, vomiting and abdominal pain (left epigastric). Negative for diarrhea, constipation, blood in stool, abdominal distention and anal bleeding.  Genitourinary: Negative for hematuria and decreased urine volume.  Skin: Negative for color change.  Allergic/Immunologic: Negative for food allergies and immunocompromised state.  Neurological: Negative for dizziness, syncope and light-headedness.  Hematological: Does not bruise/bleed easily.  Psychiatric/Behavioral: Positive for behavioral problems (very stressed). The patient is nervous/anxious.        Past Medical History  Diagnosis Date  . GI bleeding   . GERD (gastroesophageal reflux disease)   . Anxiety   . Depression   . PTSD (post-traumatic stress disorder)   . Bipolar 1 disorder   . Allergy    Current Outpatient Prescriptions on File Prior to Visit  Medication Sig Dispense Refill  . famotidine (PEPCID) 20 MG tablet Take 1 tablet (20 mg total) by mouth 2 (two) times daily.  30 tablet  0   No current facility-administered medications on file prior to visit.   No Known Allergies  History reviewed. No pertinent past surgical history.   Triage Vitals: BP 104/66  Pulse 90  Temp(Src) 98.7 F (37.1 C) (Oral)  Resp 16  Ht 5' 5.5" (1.664 m)  Wt 110 lb (49.896 kg)  BMI 18.02 kg/m2  SpO2 98% Objective:   Physical Exam  Nursing note and vitals reviewed. Constitutional: He is oriented to person, place, and time. He appears well-developed and well-nourished.  No distress.  HENT:  Head: Normocephalic and atraumatic.  Eyes: Conjunctivae and EOM are normal.  Neck: Neck supple. No tracheal deviation present.  Cardiovascular: Normal rate, regular rhythm and normal heart sounds.   Pulmonary/Chest: Effort normal and breath sounds normal. No respiratory distress.  Abdominal: Soft. Normal appearance. He exhibits no distension and no mass. Bowel sounds are increased. There is no hepatosplenomegaly. There is no tenderness. There is no rebound, no guarding and no CVA tenderness. No hernia.  Musculoskeletal: Normal range of motion.  Neurological: He is alert and oriented to person, place, and time.  Skin: Skin is warm and dry.  Psychiatric: He has a normal mood and affect. His behavior is normal.      Assessment & Plan:  4:10 PM-  Patient informed of current plan for treatment and evaluation and agrees with plan at this time.  Abdominal pain, epigastric - Plan: POCT CBC, H. pylori antibody, IgG, Comprehensive metabolic panel, Lipase, ondansetron (ZOFRAN-ODT) disintegrating tablet 8 mg  Chronic gastritis with bleeding - Plan: POCT CBC, H. pylori antibody, IgG, Comprehensive metabolic panel, Lipase, ondansetron (ZOFRAN-ODT) disintegrating tablet 8 mg  No signs of acute or active bleed - suspect he had prior bleeding ulcer. Had pt take a nexium in office then start rx ppi.  Add in carafate to help with sx relief over the next few days.  F/u w/ GI due to severity of sxs in such a young pt - pt still interested in poss endoscopy due to his h/o blood loss from this - however, hgb now reassuring. Meds ordered this encounter  Medications  . ondansetron (ZOFRAN-ODT) disintegrating tablet 8 mg    Sig:   . omeprazole (PRILOSEC) 40 MG capsule    Sig: Take 1 capsule (40 mg total) by mouth daily.    Dispense:  30 capsule    Refill:  3  . sucralfate (CARAFATE) 1 G tablet    Sig: Take 1 tablet (1 g total) by mouth 4 (four) times daily -  with meals and at bedtime.     Dispense:  120 tablet    Refill:  0    I personally performed the services described in this documentation, which was scribed in my presence. The recorded information has been reviewed and considered, and addended by me as needed.  Norberto SorensonEva Caellum Mancil, MD MPH  Results for orders placed in visit on 07/14/14  H. PYLORI ANTIBODY, IGG      Result Value Ref Range   H Pylori IgG <0.40    COMPREHENSIVE METABOLIC PANEL      Result Value Ref Range   Sodium 137  135 - 145 mEq/L   Potassium 4.5  3.5 - 5.3 mEq/L   Chloride 102  96 - 112 mEq/L   CO2 26  19 - 32 mEq/L   Glucose, Bld 83  70 - 99 mg/dL   BUN 11  6 - 23 mg/dL   Creat 1.610.91  0.960.50 - 0.451.35 mg/dL   Total Bilirubin 0.7  0.2 - 1.2 mg/dL   Alkaline Phosphatase 73  39 - 117 U/L  AST 17  0 - 37 U/L   ALT 12  0 - 53 U/L   Total Protein 7.7  6.0 - 8.3 g/dL   Albumin 5.1  3.5 - 5.2 g/dL   Calcium 9.7  8.4 - 57.8 mg/dL  LIPASE      Result Value Ref Range   Lipase 16  0 - 75 U/L  POCT CBC      Result Value Ref Range   WBC 9.3  4.6 - 10.2 K/uL   Lymph, poc 2.1  0.6 - 3.4   POC LYMPH PERCENT 22.2  10 - 50 %L   MID (cbc) 0.4  0 - 0.9   POC MID % 4.4  0 - 12 %M   POC Granulocyte 6.8  2 - 6.9   Granulocyte percent 73.4  37 - 80 %G   RBC 5.32  4.69 - 6.13 M/uL   Hemoglobin 16.4  14.1 - 18.1 g/dL   HCT, POC 46.9  62.9 - 53.7 %   MCV 96.7  80 - 97 fL   MCH, POC 30.8  27 - 31.2 pg   MCHC 31.8  31.8 - 35.4 g/dL   RDW, POC 52.8     Platelet Count, POC 246  142 - 424 K/uL   MPV 7.3  0 - 99.8 fL

## 2014-07-21 ENCOUNTER — Ambulatory Visit (INDEPENDENT_AMBULATORY_CARE_PROVIDER_SITE_OTHER): Payer: BC Managed Care – PPO

## 2014-07-21 ENCOUNTER — Encounter: Payer: Self-pay | Admitting: Gastroenterology

## 2014-07-22 ENCOUNTER — Ambulatory Visit (INDEPENDENT_AMBULATORY_CARE_PROVIDER_SITE_OTHER): Payer: BC Managed Care – PPO | Admitting: Family Medicine

## 2014-07-22 VITALS — BP 118/72 | HR 83 | Temp 98.2°F | Resp 18 | Ht 65.0 in | Wt 112.6 lb

## 2014-07-22 DIAGNOSIS — F32A Depression, unspecified: Secondary | ICD-10-CM

## 2014-07-22 DIAGNOSIS — Z569 Unspecified problems related to employment: Secondary | ICD-10-CM

## 2014-07-22 DIAGNOSIS — R002 Palpitations: Secondary | ICD-10-CM

## 2014-07-22 DIAGNOSIS — Z566 Other physical and mental strain related to work: Secondary | ICD-10-CM

## 2014-07-22 DIAGNOSIS — F411 Generalized anxiety disorder: Secondary | ICD-10-CM

## 2014-07-22 DIAGNOSIS — F329 Major depressive disorder, single episode, unspecified: Secondary | ICD-10-CM

## 2014-07-22 DIAGNOSIS — F3289 Other specified depressive episodes: Secondary | ICD-10-CM

## 2014-07-22 LAB — TSH: TSH: 0.431 u[IU]/mL (ref 0.350–4.500)

## 2014-07-22 MED ORDER — CITALOPRAM HYDROBROMIDE 20 MG PO TABS: 20.0000 mg | ORAL_TABLET | Freq: Every day | ORAL | Status: DC

## 2014-07-22 MED ORDER — CLONAZEPAM 0.5 MG PO TABS: 0.5000 mg | ORAL_TABLET | Freq: Two times a day (BID) | ORAL | Status: DC

## 2014-07-22 NOTE — Patient Instructions (Addendum)
  Take the citalopram 20 mg one daily. It will take about 2 weeks for it to kick into gear and you to start feeling better.  Take clonazepam 0.5 twice daily  Get away from the regular alcohol intake  Return in one month, sooner if worse  Counselling: Karmen BongoAaron Stewart or Nicole Cellalaude Ragan [phone] Maisie Fushomas Hedding [phone]
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
## 2014-07-27 ENCOUNTER — Encounter: Payer: Self-pay | Admitting: *Deleted

## 2014-07-28 ENCOUNTER — Ambulatory Visit: Payer: Self-pay | Admitting: Gastroenterology

## 2014-09-02 ENCOUNTER — Telehealth: Payer: Self-pay

## 2014-09-02 NOTE — Telephone Encounter (Signed)
Pt states he is in need of his Klonopin. Please call [phone]

## 2014-09-03 ENCOUNTER — Other Ambulatory Visit: Payer: Self-pay | Admitting: Family Medicine

## 2014-09-05 NOTE — Telephone Encounter (Signed)
I had put in the instructions that he needed to come back in 1 month. Needs to return to reassess before prescribing more medications.

## 2014-09-09 NOTE — Telephone Encounter (Signed)
Left message on machine to call back  

## 2014-09-13 NOTE — Telephone Encounter (Signed)
LMOM that he would have to RTC for RF's

## 2014-10-11 ENCOUNTER — Encounter (HOSPITAL_COMMUNITY): Payer: Self-pay | Admitting: Emergency Medicine

## 2014-10-11 ENCOUNTER — Emergency Department (HOSPITAL_COMMUNITY)
Admission: EM | Admit: 2014-10-11 | Discharge: 2014-10-12 | Disposition: A | Discharge status: Home / Self Care | Payer: Self-pay | Attending: Emergency Medicine | Admitting: Emergency Medicine

## 2014-10-11 DIAGNOSIS — R51 Headache: Secondary | ICD-10-CM | POA: Insufficient documentation

## 2014-10-11 DIAGNOSIS — Z8659 Personal history of other mental and behavioral disorders: Secondary | ICD-10-CM | POA: Insufficient documentation

## 2014-10-11 DIAGNOSIS — Z720 Tobacco use: Secondary | ICD-10-CM | POA: Insufficient documentation

## 2014-10-11 DIAGNOSIS — R519 Headache, unspecified: Secondary | ICD-10-CM

## 2014-10-11 DIAGNOSIS — Z8719 Personal history of other diseases of the digestive system: Secondary | ICD-10-CM | POA: Insufficient documentation

## 2014-10-11 MED ORDER — SODIUM CHLORIDE 0.9 % IV BOLUS (SEPSIS)
1000.0000 mL | Freq: Once | INTRAVENOUS | Status: AC
  Administered 2014-10-12: 1000 mL via INTRAVENOUS

## 2014-10-11 MED ORDER — ONDANSETRON HCL 4 MG/2ML IJ SOLN
4.0000 mg | Freq: Once | INTRAMUSCULAR | Status: AC
  Administered 2014-10-12: 4 mg via INTRAVENOUS
  Filled 2014-10-11: qty 2

## 2014-10-11 NOTE — ED Notes (Addendum)
Pt presents via EMS with c/o headache for the past 2 hours. Pt reports that he has some nausea with the headache, no vomiting. Pt reports the onset was sudden, ETOH on board. Pt is concerned he has a brain aneurism, grandmother had a brain aneurism.

## 2014-10-11 NOTE — ED Provider Notes (Signed)
CSN: [id number]     Arrival date & time 10/11/14  2144 History   First MD Initiated Contact with Patient 10/11/14 2251     Chief Complaint  Patient presents with  . Headache     (Consider location/radiation/quality/duration/timing/severity/associated sxs/prior Treatment) Patient is a 23 y.o. male presenting with headaches. The history is provided by the patient and medical records. No language interpreter was used.  Headache Associated symptoms: nausea and vomiting   Associated symptoms: no abdominal pain, no back pain, no cough, no diarrhea, no fatigue, no fever and no neck stiffness     COUNCIL MUNGUIA is a 23 y.o. male  with a hx of GI bleeding (2/2 to PUD caused by NSAIDs - 1 year ago), anxiety, PTSD, GERD presents to the Emergency Department complaining of sudden, persistent, progressively worsening headache onset 9PM tonight while watching football.  Pt reports no pain like this ever before.  Pt denies syncope, fever, chills, URI symptoms, abd pain.  Pt reports he vomited several times since the headache began. Emesis is NBNB, no diarrhea. Pt reports he drank a 40oz of beer tonight.  Pt reports eating hot dogs tonight as well.  Pt denies personal hx aneurysm, but his grandmother has a brain aneurysm in 2000 in her 76s.  Associated symptoms include photophobia.  Nothing makes it better.    Past Medical History  Diagnosis Date  . GI bleeding   . GERD (gastroesophageal reflux disease)   . Anxiety   . Depression   . PTSD (post-traumatic stress disorder)   . Bipolar 1 disorder   . Allergy    History reviewed. No pertinent past surgical history. Family History  Problem Relation Age of Onset  . Stroke Mother    History  Substance Use Topics  . Smoking status: Current Every Day Smoker -- 0.75 packs/day    Types: Cigarettes  . Smokeless tobacco: Not on file  . Alcohol Use: Yes     Comment: 4 beer cans (24 oz) every day     Review of Systems  Constitutional: Negative for fever,  diaphoresis, appetite change, fatigue and unexpected weight change.  HENT: Negative for mouth sores.   Eyes: Negative for visual disturbance.  Respiratory: Negative for cough, chest tightness, shortness of breath and wheezing.   Cardiovascular: Negative for chest pain.  Gastrointestinal: Positive for nausea and vomiting. Negative for abdominal pain, diarrhea and constipation.  Endocrine: Negative for polydipsia, polyphagia and polyuria.  Genitourinary: Negative for dysuria, urgency, frequency and hematuria.  Musculoskeletal: Negative for back pain and neck stiffness.  Skin: Negative for rash.  Allergic/Immunologic: Negative for immunocompromised state.  Neurological: Positive for headaches. Negative for syncope and light-headedness.  Hematological: Does not bruise/bleed easily.  Psychiatric/Behavioral: Negative for sleep disturbance. The patient is not nervous/anxious.       Allergies  Review of patient's allergies indicates no known allergies.  Home Medications   Prior to Admission medications   Not on File   BP 147/89  Pulse 69  Temp(Src) 98.7 F (37.1 C) (Oral)  Resp 18  Ht 5\' 6"  (1.676 m)  Wt 120 lb (54.432 kg)  BMI 19.38 kg/m2  SpO2 100% Physical Exam  Nursing note and vitals reviewed. Constitutional: He is oriented to person, place, and time. He appears well-developed and well-nourished. No distress.  HENT:  Head: Normocephalic and atraumatic.  Mouth/Throat: Oropharynx is clear and moist.  Eyes: Conjunctivae and EOM are normal. Pupils are equal, round, and reactive to light. No scleral icterus.  No horizontal,  vertical or rotational nystagmus  Neck: Normal range of motion. Neck supple.  Full active and passive ROM without pain No midline or paraspinal tenderness No nuchal rigidity or meningeal signs  Cardiovascular: Normal rate, regular rhythm and intact distal pulses.   Pulmonary/Chest: Effort normal and breath sounds normal. No respiratory distress. He has no  wheezes. He has no rales.  Abdominal: Soft. Bowel sounds are normal. There is no tenderness. There is no rebound and no guarding.  Musculoskeletal: Normal range of motion.  Lymphadenopathy:    He has no cervical adenopathy.  Neurological: He is alert and oriented to person, place, and time. No cranial nerve deficit. He exhibits normal muscle tone. Coordination normal. GCS eye subscore is 4. GCS verbal subscore is 5. GCS motor subscore is 6.  Reflex Scores:      Bicep reflexes are 3+ on the right side and 3+ on the left side.      Brachioradialis reflexes are 3+ on the right side and 3+ on the left side.      Patellar reflexes are 3+ on the right side and 3+ on the left side.      Achilles reflexes are 3+ on the right side and 3+ on the left side. Mental Status:  Alert, oriented, thought content appropriate. Speech fluent without evidence of aphasia. Able to follow 2 step commands without difficulty.  Cranial Nerves:  II:  Peripheral visual fields grossly normal, pupils equal, round, reactive to light III,IV, VI: ptosis not present, extra-ocular motions intact bilaterally  V,VII: smile symmetric, facial light touch sensation equal VIII: hearing grossly normal bilaterally  IX,X: gag reflex present  XI: bilateral shoulder shrug equal and strong XII: midline tongue extension  Motor:  5/5 in upper and lower extremities bilaterally including strong and equal grip strength and dorsiflexion/plantar flexion Sensory: Pinprick and light touch normal in all extremities.  Deep Tendon Reflexes: 3+ and symmetric  Cerebellar: normal finger-to-nose with bilateral upper extremities Gait: normal gait and balance CV: distal pulses palpable throughout  No clonus  Skin: Skin is warm and dry. No rash noted. He is not diaphoretic.  Psychiatric: He has a normal mood and affect. His behavior is normal. Judgment and thought content normal.    ED Course  Procedures (including critical care time) Labs  Review Labs Reviewed - No data to display  Imaging Review Ct Head Wo Contrast  10/12/2014   CLINICAL DATA:  23 year old male with acute frontal headache for several hrs. Initial encounter.  EXAM: CT HEAD WITHOUT CONTRAST  TECHNIQUE: Contiguous axial images were obtained from the base of the skull through the vertex without intravenous contrast.  COMPARISON:  None.  FINDINGS: Visualized paranasal sinuses and mastoids are clear. Visualized orbit soft tissues are within normal limits. Visualized scalp soft tissues are within normal limits. No acute osseous abnormality identified.  Cerebral volume is normal. No midline shift, ventriculomegaly, mass effect, evidence of mass lesion, intracranial hemorrhage or evidence of cortically based acute infarction. Gray-white matter differentiation is within normal limits throughout the brain. No suspicious intracranial vascular hyperdensity.  IMPRESSION: Normal noncontrast CT appearance of the brain.   Electronically Signed   By: Augusto GambleLee  Hall M.D.   On: 10/12/2014 00:44     EKG Interpretation None      MDM   Final diagnoses:  Bad headache   Raelene Bottyler R Wessman presents with sudden onset headache while drinking with his friends and watching football. Patient with family member have had brain aneurysm. We are within a six-hour  window of the onset of his headache. Patient with mild hyperreflexia but no other focal neurologic findings.  Will obtain head CT and give pain control. Patient with emesis, will give fluid bolus and Zofran.  1:06 AM Patient with minimal improvement in his headache but improvement in his emesis. CT head without evidence of subarachnoid hemorrhage. Will get migraine cocktail and reassess.  Patient discussed with Wynetta EmeryNicole Pisciotta, PA-C who will re-evaluate and PO challenge prior to D/c home.    Dahlia ClientHannah Tiffanie Blassingame, PA-C 10/12/14 0130

## 2014-10-12 ENCOUNTER — Emergency Department (HOSPITAL_COMMUNITY): Payer: Self-pay

## 2014-10-12 MED ORDER — HYDROMORPHONE HCL 1 MG/ML IJ SOLN
0.5000 mg | Freq: Once | INTRAMUSCULAR | Status: AC
  Administered 2014-10-12: 0.5 mg via INTRAVENOUS
  Filled 2014-10-12: qty 1

## 2014-10-12 MED ORDER — DIPHENHYDRAMINE HCL 50 MG/ML IJ SOLN
25.0000 mg | Freq: Once | INTRAMUSCULAR | Status: AC
  Administered 2014-10-12: 25 mg via INTRAVENOUS
  Filled 2014-10-12: qty 1

## 2014-10-12 MED ORDER — KETOROLAC TROMETHAMINE 30 MG/ML IJ SOLN
30.0000 mg | Freq: Once | INTRAMUSCULAR | Status: AC
  Administered 2014-10-12: 30 mg via INTRAVENOUS
  Filled 2014-10-12: qty 1

## 2014-10-12 MED ORDER — METOCLOPRAMIDE HCL 5 MG/ML IJ SOLN
10.0000 mg | Freq: Once | INTRAMUSCULAR | Status: AC
  Administered 2014-10-12: 10 mg via INTRAMUSCULAR
  Filled 2014-10-12: qty 2

## 2014-10-12 NOTE — ED Provider Notes (Signed)
PROGRESS NOTE                                                                                                                 This is a sign-out from PA Muthersbaugh at shift change: Carlos French is a 23 y.o. male presenting with her headache and nausea vomiting. Patient presented with a six-hour window, head CT is negative. Plan is to by mouth challenge and discharged home. Please refer to previous note for full HPI, ROS, PMH and PE.   Patient seen and evaluated the bedside, he reports significant subjective improvement. LSCTA b/l; Heart is RRR; Abd without TTP, guarding or rebound, MAE, goal oriented speech. He is tolerating by mouth and amenable to discharge.  Wynetta Emeryicole Rozann Holts, PA-C 10/12/14 0301

## 2014-10-12 NOTE — ED Provider Notes (Signed)
Medical screening examination/treatment/procedure(s) were performed by non-physician practitioner and as supervising physician I was immediately available for consultation/collaboration.   EKG Interpretation None        Lyanne CoKevin M Lilie Vezina, MD 10/12/14 [phone]

## 2014-10-12 NOTE — Discharge Instructions (Signed)
1. Medications: usual home medications 2. Treatment: rest, drink plenty of fluids, do not drink alcohol 3. Follow Up: Please followup with your primary doctor in 3 days for discussion of your diagnoses and further evaluation after today's visit; if you do not have a primary care doctor use the resource guide provided to find one;    General Headache Without Cause A headache is pain or discomfort felt around the head or neck area. The specific cause of a headache may not be found. There are many causes and types of headaches. A few common ones are:  Tension headaches.  Migraine headaches.  Cluster headaches.  Chronic daily headaches. HOME CARE INSTRUCTIONS   Keep all follow-up appointments with your caregiver or any specialist referral.  Only take over-the-counter or prescription medicines for pain or discomfort as directed by your caregiver.  Lie down in a dark, quiet room when you have a headache.  Keep a headache journal to find out what may trigger your migraine headaches. For example, write down:  What you eat and drink.  How much sleep you get.  Any change to your diet or medicines.  Try massage or other relaxation techniques.  Put ice packs or heat on the head and neck. Use these 3 to 4 times per day for 15 to 20 minutes each time, or as needed.  Limit stress.  Sit up straight, and do not tense your muscles.  Quit smoking if you smoke.  Limit alcohol use.  Decrease the amount of caffeine you drink, or stop drinking caffeine.  Eat and sleep on a regular schedule.  Get 7 to 9 hours of sleep, or as recommended by your caregiver.  Keep lights dim if bright lights bother you and make your headaches worse. SEEK MEDICAL CARE IF:   You have problems with the medicines you were prescribed.  Your medicines are not working.  You have a change from the usual headache.  You have nausea or vomiting. SEEK IMMEDIATE MEDICAL CARE IF:   Your headache becomes  severe.  You have a fever.  You have a stiff neck.  You have loss of vision.  You have muscular weakness or loss of muscle control.  You start losing your balance or have trouble walking.  You feel faint or pass out.  You have severe symptoms that are different from your first symptoms. MAKE SURE YOU:   Understand these instructions.  Will watch your condition.  Will get help right away if you are not doing well or get worse. Document Released: 12/11/2005 Document Revised: 03/04/2012 Document Reviewed: 12/27/2011 [facility] Patient Information 2015 [place], [place]. This information is not intended to replace advice given to you by your health care provider. Make sure you discuss any questions you have with your health care provider.    Emergency Department Resource Guide 1) Find a Doctor and Pay Out of Pocket Although you won't have to find out who is covered by your insurance plan, it is a good idea to ask around and get recommendations. You will then need to call the office and see if the doctor you have chosen will accept you as a new patient and what types of options they offer for patients who are self-pay. Some doctors offer discounts or will set up payment plans for their patients who do not have insurance, but you will need to ask so you aren't surprised when you get to your appointment.  2) Contact Your Local Health Department Not all health departments have  doctors that can see patients for sick visits, but many do, so it is worth a call to see if yours does. If you don't know where your local health department is, you can check in your phone book. The CDC also has a tool to help you locate your state's health department, and many state websites also have listings of all of their local health departments.  3) Find a Walk-in Clinic If your illness is not likely to be very severe or complicated, you may want to try a walk in clinic. These are popping up all over the country  in pharmacies, drugstores, and shopping centers. They're usually staffed by nurse practitioners or physician assistants that have been trained to treat common illnesses and complaints. They're usually fairly quick and inexpensive. However, if you have serious medical issues or chronic medical problems, these are probably not your best option.  No Primary Care Doctor: - Call Health Connect at  [phone] - they can help you locate a primary care doctor that  accepts your insurance, provides certain services, etc. - Physician Referral Service- [phone]  Chronic Pain Problems: Organization         Address  Phone   Notes  [facility]  [phone] Patients need to be referred by their primary care doctor.   Medication Assistance: Organization         Address  Phone   Notes  [facility] Medication [facility] [address] [place] [place], [place] [address] [phone] --Must be a resident of [facility] -- Must have NO insurance coverage whatsoever (no Medicaid/ Medicare, etc.) -- The pt. MUST have a primary care doctor that directs their care regularly and follows them in the community   [facility]ist  [phone]   [employer]  [phone]    Agencies that provide inexpensive medical care: Organization         Address  Phone   Notes  [facility]  [phone]   [facility]    [phone]   [facility] [address] [place], [place] [address] [phone]   [facility] [address] [place] [place] [phone]   Planned Parenthood    [phone]   [facility]    [phone]   [facility]  [address], [place] Phone:  [phone], Fax:  [phone] Hours of Operation:  9 am - 6 pm, M-F.  Also accepts Medicaid/Medicare and self-pay.  [facility]  [address],  [place] Phone: [phone], Fax: [phone]. Hours of Operation:  8:30 am - 5:30 pm, M-F.  Also accepts Medicaid and self-pay.  [facility] [place] [address] [place] Phone: [phone]   [facility] [address] Natasha Bence [place], [place] [phone], Ext. [phone] Mondays & Thursdays: 7-9 AM.  First 15 patients are seen on a first come, first serve basis.    Medicaid-accepting [facility] Providers:  Organization         Address  Phone   Notes  [facility] [address], [place] [phone] Also accepts self-pay patients.  [facility] [address] Laurell Josephs [place], [place]  [phone]   [facility] [address], [place] [phone]   [facility] [address], [place] [phone]   Renaye RakersVeita Bland [address], [place]   [phone]) [phone] Only accepts [place] Access [place]icaid patients after they have their name applied to their card.   Self-Pay (no insurance) in [facility]:  Organization         Address  Phone   Notes  Sickle Cell Patients, [facility] [address], [place] [phone]   [facility] [address], [place] [phone]) [phone]   [facility] [place]  [address], [place] [phone]   [facility]/Dr. Osei-Bonsu  [address] [place] [address], [place] or [phone], [place] [phone] Phone number for both [place] and [place] locations is the same.  [facility] [address], [place] [phone]) [phone]   [facility] [address] [place] [address], [place] or [address] [phone]   [facility] [address], [place] [phone], phone; [phone], fax Sees patients 1st and 3rd Saturday of every month.  Must not  qualify for public or private insurance (i.e. Medicaid, Medicare, [place] Health Choice, Veterans' Benefits)  Household income should be no more than 200% of the poverty level The clinic cannot treat you if you are pregnant or think you are pregnant  Sexually transmitted diseases are not treated at the clinic.    Dental Care: Organization         Address  Phone  Notes  [facility] [address], [place] [phone] Accepts children up to age 24 who are enrolled in [place]icaid or [place] Health Choice; pregnant women with a Medicaid card; and children who have applied for Medicaid or [place] Health Choice, but were declined, whose parents can pay a reduced fee at time of service.  [facility] of [facility] [place]  [address], [place] [phone] Accepts children up to age 24 who are enrolled in [place]icaid or [place] Health Choice; pregnant women with a Medicaid card; and children who have applied for Medicaid or [place] Health Choice, but were declined, whose parents can pay a reduced fee at time of service.  [facility]  [address], [place] [phone] Patients are seen by appointment only. Walk-ins are not accepted. [facility] will see patients 24 years of age and older. Monday - Tuesday (8am-5pm) Most Wednesdays (8:30-5pm) $30 per visit, cash only  [facility]  [address], [facility] [phone] Patients are seen by appointment only. Walk-ins are not accepted. [facility] will see patients 24 years of age and older. One Wednesday Evening (Monthly: Volunteer Based).  $30 per visit, cash only  [facility]  [phone] for adults; Children under age 734, call Graduate Pediatric Dentistry at [phone]. Children aged 534-14, please call [phone] to request a pediatric application.  Dental services are provided  in all areas of dental care including fillings, crowns and bridges, complete and partial dentures, implants, gum treatment, root canals, and extractions. Preventive care is also provided. Treatment is provided to both adults and children. Patients are selected via a lottery and there is often a waiting list.   [facility] [address], [place]  [phone] [email or web address]   [facility] [phone]  [address], [place], [place] [phone], Ext. [phone] Second and Fourth Thursday of each month, opens at 6:30 AM; Clinic ends at 9 AM.  Patients are seen on a first-come first-served basis, and a limited number are seen during each clinic.   [facility]  [address], [place], [place] [phone]   Eligibility Requirements You must have lived in [place], [place], or [place] counties for at least the last three months.   You cannot be eligible for state or federal sponsored National Cityhealthcare insurance, including CIGNAVeterans Administration, [place]icaid, or [employer] EntertainmentMedicare.   You generally cannot be eligible for healthcare insurance through your employer.    How to apply: Eligibility screenings are held every Tuesday and Wednesday afternoon from 1:00 pm until 4:00 pm. You do not need an appointment for the interview!  [facility] [address], [place], [place] [phone]   [facility]  [phone]   [facility]  [phone]   [facility]  [phone]    Behavioral Health Resources in the Community: Intensive Outpatient Programs Organization         Address  Phone  Notes  [facility] [address], [place], [place] [phone]   [facility] [address], [place], [place] [phone]   [facility] [address], [place], [place]  [phone]   [facility] [address],  [place], [place]  [phone] or [phone]   Substance Abuse Resources Organization         Address  Phone  Notes  [facility]  [phone]   [facility]  [phone]   [facility]  [phone]   [facility]  [phone]   Residential & Outpatient Substance Abuse Program  [phone]   Psychological Services Organization         Address  Phone  Notes  [facility]  [phone]- [phone]   [facility]  [phone]- [phone]   [facility] [address], [place] [phone] or [phone]    Mobile Crisis Teams Organization         Address  Phone  Notes  [facility], Mobile Crisis Care Unit  [phone]   [facility]  [address] [place], [place] [phone]   Doristine LocksSharon DeEsch [address] [place] [phone]    Self-Help/Support Groups Organization         Address  Phone             Notes  [facility] [place] - variety of support groups  336- [phone] Call for more information  Narcotics Anonymous (NA), [facility] [address], [place]  2 meetings at this location   Statisticianesidential Treatment Programs Organization         Address  Phone  Notes  [facility] [address],    [place]  [phone]   [facility]  [address], [place] [phone], [place], [place] [phone]   [facility] [address], [place] [phone] Admissions: 8am-3pm M-F  [facility] [address]    [place], [place] [phone]   [facility] [address], [place], [place] [phone]   [facility] [address]  [place], [place] [phone]   [facility] - Intensive Outpatient [phone], [place], [place]  [phone]   [facility] ([facility] [address] [place]  [place], [place] [phone] or  [phone]   [facility] ([facility]) [address] [place], [place] [phone] Accepts Medicaid  [facility] [address],  [place] [phone] Substance Abuse/Addiction Treatment   [facility] Organization         Address  Phone  Notes  [facility]  [phone]   Angie Fava, PhD [address] Ervin Knack [place], [place]   [phone] or [phone]   [facility]   [address] [place], [place] [phone]   [facility] [address], [place], [place] [phone] Insurance/Medicaid/sponsorship through [facility] Families [address]                                    [place], [place] [phone] Therapy/tele-psych/case  [facility] [address], [place] [phone]    Dr. Lolly Mustache  [phone]   [facility] [place]  [employer] [facility] Dept. 1) [address], [place] 2) [address] [place] 3)  [address], [place] [phone]  [phone]   [facility] Child Abuse Hotline (709)194-9361 or [phone] (After Hours)

## 2014-10-12 NOTE — ED Provider Notes (Signed)
Medical screening examination/treatment/procedure(s) were performed by non-physician practitioner and as supervising physician I was immediately available for consultation/collaboration.   EKG Interpretation None        Lyanne CoKevin M Elysabeth Aust, MD 10/12/14 [phone]

## 2015-07-27 ENCOUNTER — Emergency Department
Admission: EM | Admit: 2015-07-27 | Discharge: 2015-07-28 | Disposition: A | Discharge status: Home / Self Care | Payer: Self-pay | Attending: Emergency Medicine | Admitting: Emergency Medicine

## 2015-07-27 DIAGNOSIS — F1210 Cannabis abuse, uncomplicated: Secondary | ICD-10-CM | POA: Insufficient documentation

## 2015-07-27 DIAGNOSIS — F10939 Alcohol use, unspecified with withdrawal, unspecified: Secondary | ICD-10-CM

## 2015-07-27 DIAGNOSIS — F10239 Alcohol dependence with withdrawal, unspecified: Secondary | ICD-10-CM | POA: Insufficient documentation

## 2015-07-27 DIAGNOSIS — Z720 Tobacco use: Secondary | ICD-10-CM | POA: Insufficient documentation

## 2015-07-27 DIAGNOSIS — R000 Tachycardia, unspecified: Secondary | ICD-10-CM | POA: Insufficient documentation

## 2015-07-27 DIAGNOSIS — F1410 Cocaine abuse, uncomplicated: Secondary | ICD-10-CM | POA: Insufficient documentation

## 2015-07-27 LAB — CBC
HCT: 51.4 % (ref 40.0–52.0)
HEMOGLOBIN: 17.3 g/dL (ref 13.0–18.0)
MCH: 32.2 pg (ref 26.0–34.0)
MCHC: 33.7 g/dL (ref 32.0–36.0)
MCV: 95.5 fL (ref 80.0–100.0)
Platelets: 241 10*3/uL (ref 150–440)
RBC: 5.38 MIL/uL (ref 4.40–5.90)
RDW: 14.3 % (ref 11.5–14.5)
WBC: 8.6 10*3/uL (ref 3.8–10.6)

## 2015-07-27 LAB — COMPREHENSIVE METABOLIC PANEL
ALBUMIN: 4.6 g/dL (ref 3.5–5.0)
ALT: 20 U/L (ref 17–63)
ANION GAP: 11 (ref 5–15)
AST: 25 U/L (ref 15–41)
Alkaline Phosphatase: 76 U/L (ref 38–126)
BUN: 13 mg/dL (ref 6–20)
CO2: 23 mmol/L (ref 22–32)
Calcium: 9.4 mg/dL (ref 8.9–10.3)
Chloride: 105 mmol/L (ref 101–111)
Creatinine, Ser: 1.04 mg/dL (ref 0.61–1.24)
GFR calc non Af Amer: >60 mL/min (ref >60)
Glucose, Bld: 104 mg/dL — ABNORMAL HIGH (ref 65–99)
POTASSIUM: 3.2 mmol/L — AB (ref 3.5–5.1)
Sodium: 139 mmol/L (ref 135–145)
Total Bilirubin: 0.7 mg/dL (ref 0.3–1.2)
Total Protein: 8.2 g/dL — ABNORMAL HIGH (ref 6.5–8.1)

## 2015-07-27 LAB — URINE DRUG SCREEN, QUALITATIVE (ARMC ONLY)
Amphetamines, Ur Screen: NOT DETECTED
Barbiturates, Ur Screen: NOT DETECTED
Benzodiazepine, Ur Scrn: NOT DETECTED
Cannabinoid 50 Ng, Ur ~~LOC~~: POSITIVE — AB
Cocaine Metabolite,Ur ~~LOC~~: POSITIVE — AB
MDMA (Ecstasy)Ur Screen: NOT DETECTED
Methadone Scn, Ur: NOT DETECTED
Opiate, Ur Screen: NOT DETECTED
Phencyclidine (PCP) Ur S: NOT DETECTED
Tricyclic, Ur Screen: NOT DETECTED

## 2015-07-27 LAB — ETHANOL: Alcohol, Ethyl (B): 6 mg/dL — ABNORMAL HIGH (ref <5)

## 2015-07-27 MED ORDER — LORAZEPAM 2 MG/ML IJ SOLN: 1.0000 mg | Freq: Once | INTRAMUSCULAR | Status: AC

## 2015-07-27 MED ORDER — SODIUM CHLORIDE 0.9 % IV SOLN
Freq: Once | INTRAVENOUS | Status: AC
  Administered 2015-07-27: 11:00:00 via INTRAVENOUS

## 2015-07-27 MED ORDER — LORAZEPAM 2 MG PO TABS: 0.0000 mg | ORAL_TABLET | Freq: Two times a day (BID) | ORAL | Status: DC

## 2015-07-27 MED ORDER — THIAMINE HCL 100 MG/ML IJ SOLN
100.0000 mg | Freq: Every day | INTRAMUSCULAR | Status: DC
  Administered 2015-07-27: 100 mg via INTRAVENOUS
  Filled 2015-07-27: qty 2

## 2015-07-27 MED ORDER — LORAZEPAM 2 MG/ML IJ SOLN
INTRAMUSCULAR | Status: AC
  Filled 2015-07-27: qty 1

## 2015-07-27 MED ORDER — VITAMIN B-1 100 MG PO TABS
100.0000 mg | ORAL_TABLET | Freq: Every day | ORAL | Status: DC
  Administered 2015-07-28: 100 mg via ORAL
  Filled 2015-07-27: qty 1

## 2015-07-27 MED ORDER — LORAZEPAM 2 MG/ML IJ SOLN
1.0000 mg | Freq: Once | INTRAMUSCULAR | Status: AC
  Administered 2015-07-27: 1 mg via INTRAVENOUS

## 2015-07-27 MED ORDER — LORAZEPAM 2 MG/ML IJ SOLN: 0.0000 mg | Freq: Two times a day (BID) | INTRAMUSCULAR | Status: DC

## 2015-07-27 MED ORDER — LORAZEPAM 1 MG PO TABS
ORAL_TABLET | ORAL | Status: AC
  Administered 2015-07-27: 1 mg via ORAL
  Filled 2015-07-27: qty 1

## 2015-07-27 MED ORDER — LORAZEPAM 2 MG/ML IJ SOLN
0.0000 mg | Freq: Four times a day (QID) | INTRAMUSCULAR | Status: DC
  Administered 2015-07-27: 2 mg via INTRAVENOUS
  Filled 2015-07-27: qty 1

## 2015-07-27 MED ORDER — LORAZEPAM 2 MG PO TABS
0.0000 mg | ORAL_TABLET | Freq: Four times a day (QID) | ORAL | Status: DC
Start: 2015-07-27 — End: 2015-07-28
  Administered 2015-07-27: 1 mg via ORAL

## 2015-07-27 MED ORDER — SODIUM CHLORIDE 0.9 % IV SOLN
Freq: Once | INTRAVENOUS | Status: AC
  Administered 2015-07-27: 13:00:00 via INTRAVENOUS

## 2015-07-27 NOTE — ED Notes (Signed)
Patient assigned to appropriate care area. Patient oriented to unit/care area: Informed that, for their safety, care areas are designed for safety and monitored by security cameras at all times; and visiting hours explained to patient. Patient verbalizes understanding, and verbal contract for safety obtained. 

## 2015-07-27 NOTE — ED Notes (Signed)
BEHAVIORAL HEALTH ROUNDING Patient sleeping: No. Patient alert and oriented: yes Behavior appropriate: Yes.  ; If no, describe:  Nutrition and fluids offered: Yes  Toileting and hygiene offered: Yes  Sitter present: yes Law enforcement present: Yes  

## 2015-07-27 NOTE — ED Notes (Signed)

## 2015-07-27 NOTE — ED Notes (Signed)
Pt sleeping.  Iv in place.  siderails up x 2.

## 2015-07-27 NOTE — ED Notes (Signed)
Patient assigned to appropriate care area. Patient oriented to unit/care area: Informed that, for their safety, care areas are designed for safety and monitored by security cameras at all times; and visiting hours explained to patient. Patient verbalizes understanding, and verbal contract for safety obtained.ED BHU PLACEMENT JUSTIFICATION Is the patient under IVC or is there intent for IVC: No. Is the patient medically cleared: No. Is there vacancy in the ED BHU: Yes.   Is the population mix appropriate for patient: Yes.   Is the patient awaiting placement in inpatient or outpatient setting: No. Has the patient had a psychiatric consult: No. Survey of unit performed for contraband, proper placement and condition of furniture, tampering with fixtures in bathroom, shower, and each patient room: Yes.  ; Findings:  APPEARANCE/BEHAVIOR calm NEURO ASSESSMENT Orientation: time, place and person Hallucinations: No.None noted (Hallucinations) Speech: Normal Gait: normal RESPIRATORY ASSESSMENT Normal expansion.  Clear to auscultation.  No rales, rhonchi, or wheezing. CARDIOVASCULAR ASSESSMENT regular rate and rhythm, S1, S2 normal, no murmur, click, rub or gallop GASTROINTESTINAL ASSESSMENT soft, nontender, BS WNL, no r/g EXTREMITIES normal strength, tone, and muscle mass PLAN OF CARE Provide calm/safe environment. Vital signs assessed twice daily. ED BHU Assessment once each 12-hour shift. Collaborate with intake RN daily or as condition indicates. Assure the ED provider has rounded once each shift. Provide and encourage hygiene. Provide redirection as needed. Assess for escalating behavior; address immediately and inform ED provider.  Assess family dynamic and appropriateness for visitation as needed: Yes.  ; If necessary, describe findings:  Educate the patient/family about BHU procedures/visitation: Yes.  ; If necessary, describe findings:

## 2015-07-27 NOTE — ED Notes (Signed)
BEHAVIORAL HEALTH ROUNDING Patient sleeping: YES Patient alert and oriented: YES Behavior appropriate: YES Describe behavior: No inappropriate or unacceptable behaviors noted at this time.  Nutrition and fluids offered: YES Toileting and hygiene offered: YES Sitter present: Interior and spatial designer rounding every 15 minutes on patient to ensure safety.  Law enforcement present: Water quality scientist: [employer] (ODS)BEHAVIORAL HEALTH ROUNDING Patient sleeping: NO Patient alert and oriented: YES Behavior appropriate: YES Describe behavior: No inappropriate or unacceptable behaviors noted at this time.  Nutrition and fluids offered: YES Toileting and hygiene offered: YES Sitter present: Interior and spatial designer rounding every 15 minutes on patient to ensure safety.  Law enforcement present: Loss adjuster, chartered agency: [employer], television/film set (ODS)

## 2015-07-27 NOTE — ED Notes (Signed)

## 2015-07-27 NOTE — Progress Notes (Signed)
LCSW attempted to meet with patient but he was sleeping will try again Carlos French [phone]

## 2015-07-27 NOTE — ED Notes (Signed)
Lives in his truck past 2 weeks, was in treatment center in [place], got out 3 weeks ago, last etoh  2 days ago,, cocaaine 2 days ago, smoked pot yesterday, denies SI

## 2015-07-27 NOTE — ED Notes (Signed)
Pt eating crackers and drinking water.  Iv in place.  Pt calm and cooperative.

## 2015-07-27 NOTE — ED Notes (Signed)
BEHAVIORAL HEALTH ROUNDING Patient sleeping: Yes.   Patient alert and oriented: yes Behavior appropriate: Yes.  ; If no, describe:  Nutrition and fluids offered: Yes  Toileting and hygiene offered: Yes  Sitter present: yes Law enforcement present: Yes  

## 2015-07-27 NOTE — ED Notes (Signed)
Patient presents to the ED for alcohol withdrawal.  Patient states he last had alcohol two days ago.  Patient reports nausea, shakes, headache, and dizziness.  Patient denies SI and HI.  Patient denies auditory and visual hallucinations.  Patient reports using "other substances" as well.

## 2015-07-27 NOTE — ED Notes (Addendum)

## 2015-07-27 NOTE — ED Notes (Signed)
Pt sleeping. 
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
## 2015-07-27 NOTE — ED Notes (Signed)
Loleta Dicker was the RN for this pt at [phone]. Error made in charting by this RN, therefore, my name is listed under her [phone] CIWA assessment.

## 2015-07-27 NOTE — ED Notes (Signed)
Resumed care from collyn rn.  Pt sleepy.  Iv in place.   siderails up x 2.

## 2015-07-27 NOTE — ED Notes (Signed)
ENVIRONMENTAL ASSESSMENT Potentially harmful objects out of patient reach: No. Personal belongings secured: Yes.   Patient dressed in hospital provided attire only: Yes.   Plastic bags out of patient reach: Yes.   Patient care equipment (cords, cables, call bells, lines, and drains) shortened, removed, or accounted for: No. Equipment and supplies removed from bottom of stretcher: Yes.   Potentially toxic materials out of patient reach: Yes.   Sharps container removed or out of patient reach: No. 

## 2015-07-27 NOTE — ED Provider Notes (Signed)
[facility] Emergency Department Provider Note  ____________________________________________  Time seen: Approximately 9:58 AM  I have reviewed the triage vital signs and the nursing notes.   HISTORY  Chief Complaint Withdrawal    HPI Carlos French is a 24 y.o. male patient reports she usually drinks a sixpack of beer a day although sometimes is more. Patient occasionally uses cocaine. Patient has not had any alcohol for 2 days and is now withdrawing getting very shaky and nauseated and sweaty. Patient suspects usually when he gets when he withdraws. Patient has had no upper GI bleed in the past that have had to go to [facility]. He is not having any bleeding now. Patient's asking for help with withdrawal.   Past Medical History  Diagnosis Date  . GI bleeding   . GERD (gastroesophageal reflux disease)   . Anxiety   . Depression   . PTSD (post-traumatic stress disorder)   . Bipolar 1 disorder   . Allergy     There are no active problems to display for this patient.   No past surgical history on file.  No current outpatient prescriptions on file.  Allergies Review of patient's allergies indicates no known allergies.  Family History  Problem Relation Age of Onset  . Stroke Mother     Social History History  Substance Use Topics  . Smoking status: Current Every Day Smoker -- 0.75 packs/day    Types: Cigarettes  . Smokeless tobacco: Not on file  . Alcohol Use: Yes     Comment: 4 beer cans (24 oz) every day     Review of Systems Constitutional: No fever/chills Eyes: No visual changes. ENT: No sore throat. Cardiovascular: Denies chest pain. Respiratory: Denies shortness of breath. Gastrointestinal:   No diarrhea.  No constipation. Genitourinary: Negative for dysuria. Musculoskeletal: Negative for back pain. Skin: Negative for rash. Neurological: Negative for headaches, focal weakness or numbness.  10-point ROS otherwise  negative.  ____________________________________________   PHYSICAL EXAM:  VITAL SIGNS: ED Triage Vitals  Enc Vitals Group     BP 07/27/15 0905 133/83 mmHg     Pulse Rate 07/27/15 0905 91     Resp 07/27/15 0905 18     Temp 07/27/15 0905 97.8 F (36.6 C)     Temp Source 07/27/15 0905 Oral     SpO2 07/27/15 0905 100 %     Weight 07/27/15 0905 135 lb (61.236 kg)     Height 07/27/15 0905  (1.676 m)     Head Cir --      Peak Flow --      Pain Score --      Pain Loc --      Pain Edu? --      Excl. in GC? --     Constitutional: Alert and oriented. Well appearing and in no acute distress. Patient is however slightly tremulous Eyes: Conjunctivae are normal. PERRL. EOMI. Head: Atraumatic. Nose: No congestion/rhinnorhea. Mouth/Throat: Mucous membranes are moist.  Oropharynx non-erythematous. Neck: No stridor. Cardiovascular: Normal rate, regular rhythm. Grossly normal heart sounds. Slightly tachycardic  Good peripheral circulation. Respiratory: Normal respiratory effort.  No retractions. Lungs CTAB. Gastrointestinal: Soft and nontender except for some mild tenderness in the epigastric area. No distention. No abdominal bruits. No CVA tenderness. Musculoskeletal: No lower extremity tenderness nor edema.  No joint effusions. Neurologic:  Normal speech and language. No gross focal neurologic deficits are appreciated.  Skin:  Skin is warm, dry and intact. No rash noted. Psychiatric: Mood  and affect are normal. Speech and behavior are normal.  ____________________________________________   LABS (all labs ordered are listed, but only abnormal results are displayed)  Labs Reviewed  COMPREHENSIVE METABOLIC PANEL - Abnormal; Notable for the following:    Potassium 3.2 (*)    Glucose, Bld 104 (*)    Total Protein 8.2 (*)    All other components within normal limits  ETHANOL - Abnormal; Notable for the following:    Alcohol, Ethyl (B) 6 (*)    All other components within normal  limits  URINE DRUG SCREEN, QUALITATIVE ([facility] ONLY) - Abnormal; Notable for the following:    Cocaine Metabolite,Ur [place] POSITIVE (*)    Cannabinoid 50 Ng, Ur [place] POSITIVE (*)    All other components within normal limits  CBC   ____________________________________________  EKG   ____________________________________________  RADIOLOGY   ____________________________________________   PROCEDURES   ____________________________________________   INITIAL IMPRESSION / ASSESSMENT AND PLAN / ED COURSE  Pertinent labs & imaging results that were available during my care of the patient were reviewed by me and considered in my medical decision making (see chart for details).   ____________________________________________   FINAL CLINICAL IMPRESSION(S) / ED DIAGNOSES  Final diagnoses:  Alcohol withdrawal, with unspecified complication      Arnaldo Natal, MD 07/27/15 1510

## 2015-07-27 NOTE — ED Notes (Signed)
BEHAVIORAL HEALTH ROUNDING Patient sleeping: No. Patient alert and oriented: yes Behavior appropriate: Yes.  ; If no, describe:  Nutrition and fluids offered: Yes  Toileting and hygiene offered: Yes  Sitter present: no Law enforcement present: Yes  

## 2015-07-28 MED ORDER — ONDANSETRON HCL 4 MG/2ML IJ SOLN
4.0000 mg | Freq: Once | INTRAMUSCULAR | Status: AC
  Administered 2015-07-28: 4 mg via INTRAVENOUS

## 2015-07-28 MED ORDER — ONDANSETRON HCL 4 MG/2ML IJ SOLN
INTRAMUSCULAR | Status: AC
  Administered 2015-07-28: 4 mg via INTRAVENOUS
  Filled 2015-07-28: qty 2

## 2015-07-28 NOTE — ED Notes (Signed)
BEHAVIORAL HEALTH ROUNDING Patient sleeping: Yes.   Patient alert and oriented: yes Behavior appropriate: Yes.  ; If no, describe:  Nutrition and fluids offered: Yes  Toileting and hygiene offered: Yes  Sitter present: yes Law enforcement present: Yes  

## 2015-07-28 NOTE — ED Notes (Signed)

## 2015-07-28 NOTE — ED Notes (Signed)
BEHAVIORAL HEALTH ROUNDING Patient sleeping: No. Patient alert and oriented: yes Behavior appropriate: Yes.  ; If no, describe:  Nutrition and fluids offered: Yes  Toileting and hygiene offered: Yes  Sitter present: no Law enforcement present: Yes  

## 2015-07-28 NOTE — ED Notes (Signed)
Pt sleeping. 

## 2015-07-28 NOTE — ED Notes (Signed)
BEHAVIORAL HEALTH ROUNDING Patient sleeping: Yes.   Patient alert and oriented: yes Behavior appropriate: Yes.  ; If no, describe:  Nutrition and fluids offered: Yes  Toileting and hygiene offered: Yes  Sitter present: no Law enforcement present: Yes  

## 2015-07-28 NOTE — ED Notes (Signed)
BEHAVIORAL HEALTH ROUNDING Patient sleeping: Yes.   Patient alert and oriented: sleeping Behavior appropriate: Yes.  ; If no, describe: sleeping Nutrition and fluids offered: sleeping Toileting and hygiene offered: sleeping Sitter present: no Law enforcement present: Yes  and ODS 

## 2015-07-28 NOTE — Progress Notes (Signed)
LCSW spoke to patients father ( verbal consent given by patient) Patient was in a long term treatment program in [facility]). Patient does not wish to return to [facility] at this time but rather have out patient support and get a job. Father was provided with resources ( Nami and Camelia Phenes was discussed. Father stated he loves his son but will not have him at the house due to conflict with his wife. Patient reports he is not suicidal or homicidal.

## 2015-07-28 NOTE — ED Notes (Signed)
Pt vomiting.  MD aware.

## 2015-07-28 NOTE — ED Provider Notes (Signed)
-----------------------------------------   7:17 AM on 07/28/2015 -----------------------------------------   Blood pressure 129/94, pulse 61, temperature 98.5 F (36.9 C), temperature source Oral, resp. rate 18, height  (1.676 m), weight 135 lb (61.236 kg), SpO2 99 %.  The patient had no acute events since last update.  Calm and cooperative at this time.  Disposition is pending per Psychiatry/Behavioral Medicine team recommendations.     Arnaldo Natal, MD 07/28/15 [phone]

## 2015-07-28 NOTE — Progress Notes (Signed)
LCSW met with patient he gave verbal consent to contact his father [phone]. He has a current job at [employer]. His dad notified his future employer. Patient is agreeable to [facility] program at either [place] or [place]. Patient is homeless but will stay with a friend vs going to the shelter. A united way resource list was provided, several other shelter and area resources provided to patient. Patient was polite and cooperative. He feels that outpatient treatment would meet his needs better than residential programs at this time. Past History- He has been in [facility], several inpatient treatment centers. He has no insurance, medication management brochure provided

## 2015-07-28 NOTE — Clinical Social Work Note (Signed)
Clinical Social Work Assessment  Patient Details  Name: Carlos French MRN: [id number] Date of Birth: Mar 09, 1991  Date of referral:  07/28/15               Reason for consult:  Housing Concerns/Homelessness                Permission sought to share information with:   David Rodriquez [phone] Permission granted to share information::  Yes, Verbal Permission Granted  Name::     Gustavo Lah Rosanne Ashing)  Agency::  No  Relationship::  na  Contact Information:  na  Housing/Transportation Living arrangements for the past 2 months:  Homeless Source of Information:  Patient Patient Interpreter Needed:  None Criminal Activity/Legal Involvement Pertinent to Current Situation/Hospitalization:  No - Comment as needed Significant Relationships:  Parents, Friend Lives with:   Self Do you feel safe going back to the place where you live?  No Need for family participation in patient care:  Yes (Comment) (My dad is supportive but I cant live with him)  Care giving concerns:  None   Social Worker assessment / plan:  Patient reported he is not homicidal or suicidal and looks forward to future employment. He was provided several resources to address his homeless situation. Patient will stay in truck or with friends ( not the shelter) He has a plan to attend court for (family child support issues). He will access his family for additional support. LCSW reviewed medication management with patient and he will access the provided resources as required or needed.  Employment status:  Other (Comment) (I have a job and can start with [employer] as soon as I leave hospital) Insurance information:  Self Pay (Medicaid Pending) PT Recommendations:  No Follow Up Information / Referral to community resources:  Outpatient Substance Abuse Treatment Options, Shelter  Patient/Family's Response to care:  Dad wants him in a 2 year residential program  Patient/Family's Understanding of and Emotional Response to Diagnosis,  Current Treatment, and Prognosis:  Father does understand his son will decide what  Is best for himeself  Emotional Assessment Appearance:  Well-Groomed Attitude/Demeanor/Rapport:   Good Affect (typically observed):  Accepting, Hopeful Orientation:  Oriented to Self, Oriented to Place, Oriented to  Time, Oriented to Situation, Fluctuating Orientation (Suspected and/or reported Sundowners) Alcohol / Substance use:  Tobacco Use, Alcohol Use, Illicit Drugs Psych involvement (Current and /or in the community):  Yes (Comment)  Discharge Needs  Concerns to be addressed:  Medication Concerns, Homelessness, Substance Abuse Concerns Readmission within the last 30 days:  No Current discharge risk:  None Barriers to Discharge:  Substance use issues   Cheron Schaumann, LCSW 07/28/2015, 11:16 AM

## 2015-07-28 NOTE — Discharge Instructions (Signed)
Alcohol Withdrawal Alcohol withdrawal happens when you normally drink alcohol a lot and suddenly stop drinking. Alcohol withdrawal symptoms can be mild to very bad. Mild withdrawal symptoms can include feeling sick to your stomach (nauseous), headache, or feeling irritable. Bad withdrawal symptoms can include shakiness, being very nervous (anxious), and not thinking clearly.  HOME CARE  Join an alcohol support group.  Stay away from people or situations that make you want to drink.  Eat a healthy diet. Eat a lot of fresh fruits, vegetables, and lean meats. GET HELP RIGHT AWAY IF:   You become confused. You start to see and hear things that are not really there.  You feel your heart beating very fast.  You throw up (vomit) blood or cannot stop throwing up. This may be bright red or look like black coffee grounds.  You have blood in your poop (stool). This may be bright red, maroon colored, or black and tarry.  You are lightheaded or pass out (faint).  You develop a fever. MAKE SURE YOU:   Understand these instructions.  Will watch your condition.  Will get help right away if you are not doing well or get worse. Document Released: 05/29/2008 Document Revised: 03/04/2012 Document Reviewed: 05/29/2008 [facility] Patient Information 2015 [place], [place]. This information is not intended to replace advice given to you by your health care provider. Make sure you discuss any questions you have with your health care provider.  Alcohol Use Disorder Alcohol use disorder is a mental disorder. It is not a one-time incident of heavy drinking. Alcohol use disorder is the excessive and uncontrollable use of alcohol over time that leads to problems with functioning in one or more areas of daily living. People with this disorder risk harming themselves and others when they drink to excess. Alcohol use disorder also can cause other mental disorders, such as mood and anxiety disorders, and serious  physical problems. People with alcohol use disorder often misuse other drugs.  Alcohol use disorder is common and widespread. Some people with this disorder drink alcohol to cope with or escape from negative life events. Others drink to relieve chronic pain or symptoms of mental illness. People with a family history of alcohol use disorder are at higher risk of losing control and using alcohol to excess.  SYMPTOMS  Signs and symptoms of alcohol use disorder may include the following:   Consumption ofalcohol inlarger amounts or over a longer period of time than intended.  Multiple unsuccessful attempts to cutdown or control alcohol use.   A great deal of time spent obtaining alcohol, using alcohol, or recovering from the effects of alcohol (hangover).  A strong desire or urge to use alcohol (cravings).   Continued use of alcohol despite problems at work, school, or home because of alcohol use.   Continued use of alcohol despite problems in relationships because of alcohol use.  Continued use of alcohol in situations when it is physically hazardous, such as driving a car.  Continued use of alcohol despite awareness of a physical or psychological problem that is likely related to alcohol use. Physical problems related to alcohol use can involve the brain, heart, liver, stomach, and intestines. Psychological problems related to alcohol use include intoxication, depression, anxiety, psychosis, delirium, and dementia.   The need for increased amounts of alcohol to achieve the same desired effect, or a decreased effect from the consumption of the same amount of alcohol (tolerance).  Withdrawal symptoms upon reducing or stopping alcohol use, or alcohol use  to reduce or avoid withdrawal symptoms. Withdrawal symptoms include:  Racing heart.  Hand tremor.  Difficulty sleeping.  Nausea.  Vomiting.  Hallucinations.  Restlessness.  Seizures. DIAGNOSIS Alcohol use disorder is  diagnosed through an assessment by your health care provider. Your health care provider may start by asking three or four questions to screen for excessive or problematic alcohol use. To confirm a diagnosis of alcohol use disorder, at least two symptoms must be present within a 72-month period. The severity of alcohol use disorder depends on the number of symptoms:  Mild--two or three.  Moderate--four or five.  Severe--six or more. Your health care provider may perform a physical exam or use results from lab tests to see if you have physical problems resulting from alcohol use. Your health care provider may refer you to a mental health professional for evaluation. TREATMENT  Some people with alcohol use disorder are able to reduce their alcohol use to low-risk levels. Some people with alcohol use disorder need to quit drinking alcohol. When necessary, mental health professionals with specialized training in substance use treatment can help. Your health care provider can help you decide how severe your alcohol use disorder is and what type of treatment you need. The following forms of treatment are available:   Detoxification. Detoxification involves the use of prescription medicines to prevent alcohol withdrawal symptoms in the first week after quitting. This is important for people with a history of symptoms of withdrawal and for heavy drinkers who are likely to have withdrawal symptoms. Alcohol withdrawal can be dangerous and, in severe cases, cause death. Detoxification is usually provided in a hospital or in-patient substance use treatment facility.  Counseling or talk therapy. Talk therapy is provided by substance use treatment counselors. It addresses the reasons people use alcohol and ways to keep them from drinking again. The goals of talk therapy are to help people with alcohol use disorder find healthy activities and ways to cope with life stress, to identify and avoid triggers for alcohol  use, and to handle cravings, which can cause relapse.  Medicines.Different medicines can help treat alcohol use disorder through the following actions:  Decrease alcohol cravings.  Decrease the positive reward response felt from alcohol use.  Produce an uncomfortable physical reaction when alcohol is used (aversion therapy).  Support groups. Support groups are run by people who have quit drinking. They provide emotional support, advice, and guidance. These forms of treatment are often combined. Some people with alcohol use disorder benefit from intensive combination treatment provided by specialized substance use treatment centers. Both inpatient and outpatient treatment programs are available. Document Released: 01/18/2005 Document Revised: 04/27/2014 Document Reviewed: 03/20/2013 [facility] Patient Information 2015 [place], [place]. This information is not intended to replace advice given to you by your health care provider. Make sure you discuss any questions you have with your health care provider. Follow up with [facility] behavioral health

## 2015-07-29 ENCOUNTER — Emergency Department
Admission: EM | Admit: 2015-07-29 | Discharge: 2015-07-29 | Disposition: A | Discharge status: Home / Self Care | Payer: Self-pay | Attending: Emergency Medicine | Admitting: Emergency Medicine

## 2015-07-29 ENCOUNTER — Encounter: Payer: Self-pay | Admitting: Emergency Medicine

## 2015-07-29 DIAGNOSIS — F10230 Alcohol dependence with withdrawal, uncomplicated: Secondary | ICD-10-CM | POA: Insufficient documentation

## 2015-07-29 DIAGNOSIS — Z720 Tobacco use: Secondary | ICD-10-CM | POA: Insufficient documentation

## 2015-07-29 DIAGNOSIS — F10930 Alcohol use, unspecified with withdrawal, uncomplicated: Secondary | ICD-10-CM

## 2015-07-29 MED ORDER — LORAZEPAM 1 MG PO TABS: 1.0000 mg | ORAL_TABLET | Freq: Three times a day (TID) | ORAL | Status: DC | PRN

## 2015-07-29 NOTE — ED Notes (Addendum)
Patient to ER for c/o tingling to hands, face, and head. States he just stopped Trazodone, Depakote, Effexor, Seroquel. States he stopped completely without tapering d/t becoming homeless. Denies any other symptoms.

## 2015-07-29 NOTE — ED Provider Notes (Signed)
[facility] Emergency Department Provider Note  ____________________________________________  Time seen: 1345  I have reviewed the triage vital signs and the nursing notes.   HISTORY  Chief Complaint Tingling   {HPI Carlos French is a 24 y.o. male who stays having some diffuse numbness and tingling across his hands, face, head. He denies any hallucinations recently here for detox. He denies any fever or chills or focal weakness in either upper or lower extremities. Patient states he had been at a rehabilitation center in [place]. Recently had a relapse of his alcohol abuse. Patient states that since his discharge from here yesterday is not partaken in any alcohol but noticed a diffuse numbness and tingling. He denies any chest pain or shortness of breath.     Past Medical History  Diagnosis Date  . GI bleeding   . GERD (gastroesophageal reflux disease)   . Anxiety   . Depression   . PTSD (post-traumatic stress disorder)   . Bipolar 1 disorder   . Allergy     There are no active problems to display for this patient.   History reviewed. No pertinent past surgical history.  Current Outpatient Rx  Name  Route  Sig  Dispense  Refill  . LORazepam (ATIVAN) 1 MG tablet   Oral   Take 1 tablet (1 mg total) by mouth every 8 (eight) hours as needed for anxiety (withdrawal).   15 tablet   0     Allergies Review of patient's allergies indicates no known allergies.  Family History  Problem Relation Age of Onset  . Stroke Mother     Social History History  Substance Use Topics  . Smoking status: Current Every Day Smoker -- 0.75 packs/day    Types: Cigarettes  . Smokeless tobacco: Not on file  . Alcohol Use: Yes     Comment: 4 beer cans (24 oz) every day     Review of Systems  Constitutional: Negative for fever. Eyes: Negative for visual changes. ENT: Negative for sore throat Cardiovascular: Negative for chest pain. Respiratory: Negative  for shortness of breath. Gastrointestinal: Negative for abdominal pain, vomiting and diarrhea. Genitourinary: Negative for dysuria. Musculoskeletal: Negative for back pain. Skin: Negative for rash. Neurological: Negative for headaches or focal weakness Psychiatric: Patient's been sleeping comparably in the stretcher.    ____________________________________________   PHYSICAL EXAM:  VITAL SIGNS: ED Triage Vitals  Enc Vitals Group     BP 07/29/15 1216 117/68 mmHg     Pulse Rate 07/29/15 1404 80     Resp 07/29/15 1216 18     Temp 07/29/15 1216 98.2 F (36.8 C)     Temp Source 07/29/15 1216 Oral     SpO2 07/29/15 1216 100 %     Weight 07/29/15 1216 135 lb (61.236 kg)     Height 07/29/15 1216  (1.676 m)     Head Cir --      Peak Flow --      Pain Score 07/29/15 1218 0     Pain Loc --      Pain Edu? --      Excl. in GC? --     Constitutional: Alert and oriented. Well appearing and in no distress. Eyes: Conjunctivae are normal.  ENT   Head: Normocephalic and atraumatic.   Mouth/Throat: Mucous membranes are moist. Cardiovascular: Normal rate, regular rhythm. Normal and symmetric distal pulses are present in all extremities. No murmurs, rubs, or gallops. Respiratory: Normal respiratory effort without tachypnea nor  retractions. Breath sounds are clear and equal bilaterally.  Gastrointestinal: Soft and non-tender in all quadrants. No distention. There is no CVA tenderness. Genitourinary: deferred Musculoskeletal: Nontender with normal range of motion in all extremities. No lower extremity tenderness nor edema. Neurologic:  Normal speech and language. No gross focal neurologic deficits are appreciated. Skin:  Skin is warm, dry and intact. No rash noted. Psychiatric: Mood and affect are normal. Patient exhibits appropriate insight and judgment.  ____________________________________________    LABS (pertinent positives/negatives)  Labs Reviewed - No data to  display  ______ I felt this time laboratory work was not necessary ______________________________________   EKG  None  ____________________________________________     ____________________________________________   PROCEDURES  Procedure(s) performed: none  Critical Care performed: None  ____________________________________________   INITIAL IMPRESSION / ASSESSMENT AND PLAN / ED COURSE  Pertinent labs & imaging results that were available during my care of the patient were reviewed by me and considered in my medical decision making (see chart for details). Patient presents with what seems to be some very mild alcohol withdrawal symptoms. He has history of multi-substance abuse and I was present to start him on IV other psychiatric medications without local primary medicine follow-up. He was given a prescription of Ativan along with visits from both the social worker and seen him yesterday and the patient relation personnel to help him get his prescription filled. Patient seems to be of understanding with the plan. He states he is not able to afford any of his medications and required the assistance. The numerous options for his follow-up yesterday and also today. I felt was unlikely had delirium tremens or significant alcohol withdrawal as his vital signs remained stable and there is not having hallucinations etc.  ____________________________________________   FINAL CLINICAL IMPRESSION(S) / ED DIAGNOSES Uncomplicated alcohol withdrawal Final diagnoses:  Alcohol withdrawal, uncomplicated     Jennye Moccasin, MD 07/29/15 1610

## 2015-07-29 NOTE — Discharge Instructions (Signed)
Finding Treatment for Alcohol and Drug Addiction °It can be hard to find the right place to get professional treatment. Here are some important things to consider: °· There are different types of treatment to choose from. °· Some programs are live-in (residential) while others are not (outpatient). Sometimes a combination is offered. °· No single type of program is right for everyone. °· Most treatment programs involve a combination of education, counseling, and a 12-step, spiritually-based approach. °· There are non-spiritually based programs (not 12-step). °· Some treatment programs are government sponsored. They are geared for patients without private insurance. °· Treatment programs can vary in many respects such as: °¨ Cost and types of insurance accepted. °¨ Types of on-site medical services offered. °¨ Length of stay, setting, and size. °¨ Overall philosophy of treatment.  °A person may need specialized treatment or have needs not addressed by all programs. For example, adolescents need treatment appropriate for their age. Other people have secondary disorders that must be managed as well. Secondary conditions can include mental illness, such as depression or diabetes. Often, a period of detoxification from alcohol or drugs is needed. This requires medical supervision and not all programs offer this. °THINGS TO CONSIDER WHEN SELECTING A TREATMENT PROGRAM  °· Is the program certified by the appropriate government agency? Even private programs must be certified and employ certified professionals. °· Does the program accept your insurance? If not, can a payment plan be set up? °· Is the facility clean, organized, and well run? Do they allow you to speak with graduates who can share their treatment experience with you? Can you tour the facility? Can you meet with staff? °· Does the program meet the full range of individual needs? °· Does the treatment program address sexual orientation and physical disabilities?  Do they provide age, gender, and culturally appropriate treatment services? °· Is treatment available in languages other than English? °· Is long-term aftercare support or guidance encouraged and provided? °· Is assessment of an individual's treatment plan ongoing to ensure it meets changing needs? °· Does the program use strategies to encourage reluctant patients to remain in treatment long enough to increase the likelihood of success? °· Does the program offer counseling (individual or group) and other behavioral therapies? °· Does the program offer medicine as part of the treatment regimen, if needed? °· Is there ongoing monitoring of possible relapse? Is there a defined relapse prevention program? Are services or referrals offered to family members to ensure they understand addiction and the recovery process? This would help them support the recovering individual. °· Are 12-step meetings held at the center or is transport available for patients to attend outside meetings? °In countries outside of the [place], see local directories for contact information for services in your area. °Document Released: 11/09/2005 Document Revised: 03/04/2012 Document Reviewed: 05/21/2008 °[employer]® Patient Information ©2015 [employer]. This information is not intended to replace advice given to you by your health care provider. Make sure you discuss any questions you have with your health care provider. ° °Alcohol Withdrawal °Alcohol withdrawal happens when you normally drink alcohol a lot and suddenly stop drinking. Alcohol withdrawal symptoms can be mild to very bad. Mild withdrawal symptoms can include feeling sick to your stomach (nauseous), headache, or feeling irritable. Bad withdrawal symptoms can include shakiness, being very nervous (anxious), and not thinking clearly.  °HOME CARE °· Join an alcohol support group. °· Stay away from people or situations that make you want to drink. °· Eat a   healthy diet. Eat a  lot of fresh fruits, vegetables, and lean meats. °GET HELP RIGHT AWAY IF:  °· You become confused. You start to see and hear things that are not really there. °· You feel your heart beating very fast. °· You throw up (vomit) blood or cannot stop throwing up. This may be bright red or look like black coffee grounds. °· You have blood in your poop (stool). This may be bright red, maroon colored, or black and tarry. °· You are lightheaded or pass out (faint). °· You develop a fever. °MAKE SURE YOU:  °· Understand these instructions. °· Will watch your condition. °· Will get help right away if you are not doing well or get worse. °Document Released: 05/29/2008 Document Revised: 03/04/2012 Document Reviewed: 05/29/2008 °[employer]® Patient Information ©2015 [employer]. This information is not intended to replace advice given to you by your health care provider. Make sure you discuss any questions you have with your health care provider. ° °

## 2015-08-10 ENCOUNTER — Encounter: Payer: Self-pay | Admitting: Emergency Medicine

## 2015-08-10 ENCOUNTER — Emergency Department
Admission: EM | Admit: 2015-08-10 | Discharge: 2015-08-12 | Disposition: A | Discharge status: Other Acute Inpatient Hospital | Payer: Self-pay | Attending: Emergency Medicine | Admitting: Emergency Medicine

## 2015-08-10 DIAGNOSIS — F1210 Cannabis abuse, uncomplicated: Secondary | ICD-10-CM | POA: Insufficient documentation

## 2015-08-10 DIAGNOSIS — F1910 Other psychoactive substance abuse, uncomplicated: Secondary | ICD-10-CM

## 2015-08-10 DIAGNOSIS — F1020 Alcohol dependence, uncomplicated: Secondary | ICD-10-CM

## 2015-08-10 DIAGNOSIS — F419 Anxiety disorder, unspecified: Secondary | ICD-10-CM | POA: Insufficient documentation

## 2015-08-10 DIAGNOSIS — F1010 Alcohol abuse, uncomplicated: Secondary | ICD-10-CM | POA: Insufficient documentation

## 2015-08-10 DIAGNOSIS — R202 Paresthesia of skin: Secondary | ICD-10-CM | POA: Insufficient documentation

## 2015-08-10 DIAGNOSIS — F333 Major depressive disorder, recurrent, severe with psychotic symptoms: Secondary | ICD-10-CM | POA: Insufficient documentation

## 2015-08-10 DIAGNOSIS — F1420 Cocaine dependence, uncomplicated: Secondary | ICD-10-CM

## 2015-08-10 DIAGNOSIS — Z720 Tobacco use: Secondary | ICD-10-CM | POA: Insufficient documentation

## 2015-08-10 DIAGNOSIS — F1220 Cannabis dependence, uncomplicated: Secondary | ICD-10-CM

## 2015-08-10 DIAGNOSIS — F1410 Cocaine abuse, uncomplicated: Secondary | ICD-10-CM | POA: Insufficient documentation

## 2015-08-10 HISTORY — DX: Gastrointestinal hemorrhage, unspecified: K92.2

## 2015-08-10 LAB — CBC
HEMATOCRIT: 47.7 % (ref 40.0–52.0)
HEMOGLOBIN: 15.9 g/dL (ref 13.0–18.0)
MCH: 32.1 pg (ref 26.0–34.0)
MCHC: 33.5 g/dL (ref 32.0–36.0)
MCV: 95.9 fL (ref 80.0–100.0)
Platelets: 212 10*3/uL (ref 150–440)
RBC: 4.97 MIL/uL (ref 4.40–5.90)
RDW: 14.5 % (ref 11.5–14.5)
WBC: 6.9 10*3/uL (ref 3.8–10.6)

## 2015-08-10 LAB — COMPREHENSIVE METABOLIC PANEL
ALK PHOS: 81 U/L (ref 38–126)
ALT: 15 U/L — ABNORMAL LOW (ref 17–63)
ANION GAP: 10 (ref 5–15)
AST: 22 U/L (ref 15–41)
Albumin: 4.5 g/dL (ref 3.5–5.0)
BUN: 11 mg/dL (ref 6–20)
CHLORIDE: 107 mmol/L (ref 101–111)
CO2: 24 mmol/L (ref 22–32)
Calcium: 9.2 mg/dL (ref 8.9–10.3)
Creatinine, Ser: 1.01 mg/dL (ref 0.61–1.24)
GFR calc non Af Amer: >60 mL/min (ref >60)
GLUCOSE: 146 mg/dL — AB (ref 65–99)
POTASSIUM: 3.6 mmol/L (ref 3.5–5.1)
SODIUM: 141 mmol/L (ref 135–145)
Total Bilirubin: 0.3 mg/dL (ref 0.3–1.2)
Total Protein: 7.5 g/dL (ref 6.5–8.1)

## 2015-08-10 LAB — URINE DRUG SCREEN, QUALITATIVE (ARMC ONLY)
AMPHETAMINES, UR SCREEN: NOT DETECTED
BARBITURATES, UR SCREEN: NOT DETECTED
BENZODIAZEPINE, UR SCRN: NOT DETECTED
COCAINE METABOLITE, UR ~~LOC~~: POSITIVE — AB
Cannabinoid 50 Ng, Ur ~~LOC~~: POSITIVE — AB
MDMA (Ecstasy)Ur Screen: NOT DETECTED
METHADONE SCREEN, URINE: NOT DETECTED
OPIATE, UR SCREEN: NOT DETECTED
Phencyclidine (PCP) Ur S: NOT DETECTED
TRICYCLIC, UR SCREEN: NOT DETECTED

## 2015-08-10 LAB — ETHANOL: Alcohol, Ethyl (B): 13 mg/dL — ABNORMAL HIGH (ref <5)

## 2015-08-10 LAB — ACETAMINOPHEN LEVEL

## 2015-08-10 LAB — SALICYLATE LEVEL

## 2015-08-10 MED ORDER — TRAZODONE HCL 50 MG PO TABS
50.0000 mg | ORAL_TABLET | Freq: Every day | ORAL | Status: DC
  Administered 2015-08-10 – 2015-08-11 (×2): 50 mg via ORAL
  Filled 2015-08-10: qty 1

## 2015-08-10 MED ORDER — ONDANSETRON 4 MG PO TBDP
ORAL_TABLET | ORAL | Status: AC
  Administered 2015-08-10: 4 mg via ORAL
  Filled 2015-08-10: qty 1

## 2015-08-10 MED ORDER — CITALOPRAM HYDROBROMIDE 20 MG PO TABS
40.0000 mg | ORAL_TABLET | Freq: Every day | ORAL | Status: DC
  Administered 2015-08-10 – 2015-08-12 (×3): 40 mg via ORAL
  Filled 2015-08-10 (×3): qty 2

## 2015-08-10 MED ORDER — ONDANSETRON 4 MG PO TBDP
4.0000 mg | ORAL_TABLET | Freq: Once | ORAL | Status: AC
  Administered 2015-08-10: 4 mg via ORAL

## 2015-08-10 MED ORDER — THIAMINE HCL 100 MG/ML IJ SOLN: 100.0000 mg | Freq: Every day | INTRAMUSCULAR | Status: DC

## 2015-08-10 MED ORDER — LORAZEPAM 1 MG PO TABS
ORAL_TABLET | ORAL | Status: AC
  Administered 2015-08-10: 1 mg via ORAL
  Filled 2015-08-10: qty 1

## 2015-08-10 MED ORDER — LORAZEPAM 2 MG PO TABS
0.0000 mg | ORAL_TABLET | Freq: Four times a day (QID) | ORAL | Status: AC
  Administered 2015-08-10 – 2015-08-11 (×3): 2 mg via ORAL
  Filled 2015-08-10 (×3): qty 1

## 2015-08-10 MED ORDER — ONDANSETRON 8 MG PO TBDP
4.0000 mg | ORAL_TABLET | Freq: Three times a day (TID) | ORAL | Status: DC | PRN
  Filled 2015-08-10: qty 0.5

## 2015-08-10 MED ORDER — VITAMIN B-1 100 MG PO TABS
100.0000 mg | ORAL_TABLET | Freq: Every day | ORAL | Status: DC
  Administered 2015-08-10 – 2015-08-11 (×2): 100 mg via ORAL
  Filled 2015-08-10 (×2): qty 1

## 2015-08-10 MED ORDER — LORAZEPAM 2 MG PO TABS
0.0000 mg | ORAL_TABLET | Freq: Two times a day (BID) | ORAL | Status: DC
  Administered 2015-08-10 – 2015-08-11 (×2): 1 mg via ORAL
  Administered 2015-08-12: 2 mg via ORAL
  Filled 2015-08-10: qty 1

## 2015-08-10 MED ORDER — ALUM & MAG HYDROXIDE-SIMETH 200-200-20 MG/5ML PO SUSP: 15.0000 mL | Freq: Four times a day (QID) | ORAL | Status: DC | PRN
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
## 2015-08-10 NOTE — ED Notes (Signed)
Hand-off report given to Kimrey, RN.  

## 2015-08-10 NOTE — ED Notes (Signed)
BEHAVIORAL HEALTH ROUNDING Patient sleeping: Yes.   Patient alert and oriented: not applicable Behavior appropriate: Yes.  ; If no, describe:  Nutrition and fluids offered: Yes  Toileting and hygiene offered: Yes  Sitter present: not applicable Law enforcement present: Yes  

## 2015-08-10 NOTE — ED Notes (Signed)
BEHAVIORAL HEALTH ROUNDING Patient sleeping: No. Patient alert and oriented: yes Behavior appropriate: Yes.  ; If no, describe:  Nutrition and fluids offered: Yes  Toileting and hygiene offered: Yes  Sitter present: not applicable Law enforcement present: Yes  

## 2015-08-10 NOTE — Progress Notes (Signed)
LCSW met with patient to inquire on why he returned to ED, he relapsed and stated he used cocaine and drank a lot of booze to attempt a suicide. He reports he is not bad as he was yesterday. In discussion the patient reported he did not follow up with [facility] or [place] walk in clinics or go to medication management, or stay at the shelter. This plan of care was reviewed at length 2 weeks ago. Patient was also to consider returning to [facility] treatment center. He did not go back, did not take his medications and did not follow up at all. Patient did apply and obtained employment at [employer]) and slept in his truck. He only works 2 days a week. He is agreeable to meet with [facility] 18 month program. He relayed his court matters for child support arrears has been held over until Sept 9th and he has to come up with 400.00 and he reports he does not get enough hours/pay to meet this requirement therefore he may end up in jail. Reviewed additional resources again with patient HE REFUSED TO STAY OR CONNECT  With [facility]  With patients verbal consent LCSW agreed to call [employer] LCSW called [employer] and left a detailed message for [employer] [phone]

## 2015-08-10 NOTE — ED Notes (Signed)
Patient assigned to appropriate care area. Patient oriented to unit/care area: Informed that, for their safety, care areas are designed for safety and monitored by staff at all times; and visiting hours explained to patient. Patient verbalizes understanding, and verbal contract for safety obtained. 

## 2015-08-10 NOTE — ED Notes (Signed)
BEHAVIORAL HEALTH ROUNDING Patient sleeping: Yes.   Patient alert and oriented: yes Behavior appropriate: Yes.  ; If no, describe:  Nutrition and fluids offered: Yes  Toileting and hygiene offered: Yes  Sitter present: not applicable Law enforcement present: Yes  

## 2015-08-10 NOTE — ED Notes (Signed)
Per Dr. Fanny Bien, patient no longer needs sitter. Patient is now Q15 checks

## 2015-08-10 NOTE — ED Notes (Signed)

## 2015-08-10 NOTE — ED Provider Notes (Signed)
[facility] Emergency Department Provider Note  ____________________________________________  Time seen: Approximately 9:25 AM  I have reviewed the triage vital signs and the nursing notes.   HISTORY  Chief Complaint Suicidal    HPI Carlos French is a 24 y.o. male who reports he was recently in rehabilitation, and is now homeless. Last night he tried to kill himself by drinking excessive amounts of alcohol including several 40s, a fifth, and then smoking as much crack as he could find. He reports he feels suicidal, and wanted to drink himself to death last night.  Denies being in pain, he does feel somewhat nauseated and his stomach is a little bit upset at times. No fevers or chills. No chest pain. Does have some chronic tingling in his hands and toes, which is been thought due to alcoholism per the patient.  No hallucinations. Denies what thralls now, but states he will get terrible shakes if he goes 1-2 days without drinking. No history withdrawal seizures. One start himself, no desire anyone else.  Uses only store-bought alcohol. Denies drinking any antifreeze or other alcohol containing substances.   Past Medical History  Diagnosis Date  . GI bleeding   . GERD (gastroesophageal reflux disease)   . Anxiety   . Depression   . PTSD (post-traumatic stress disorder)   . Bipolar 1 disorder   . Allergy   . GI (gastrointestinal bleed)     There are no active problems to display for this patient.   No past surgical history on file.  Current Outpatient Rx  Name  Route  Sig  Dispense  Refill  . LORazepam (ATIVAN) 1 MG tablet   Oral   Take 1 tablet (1 mg total) by mouth every 8 (eight) hours as needed for anxiety (withdrawal).   15 tablet   0     Allergies Review of patient's allergies indicates no known allergies.  Family History  Problem Relation Age of Onset  . Stroke Mother     Social History Social History  Substance Use Topics  .  Smoking status: Current Every Day Smoker -- 0.75 packs/day    Types: Cigarettes  . Smokeless tobacco: None  . Alcohol Use: Yes     Comment: 4 beer cans (24 oz) every day     Review of Systems Constitutional: No fever/chills Eyes: No visual changes. ENT: No sore throat. Cardiovascular: Denies chest pain. Respiratory: Denies shortness of breath. Gastrointestinal:  no vomiting.  No diarrhea.  No constipation. Genitourinary: Negative for dysuria. Musculoskeletal: Negative for back pain. Skin: Negative for rash. Neurological: Negative for headaches, focal weakness or numbness.  10-point ROS otherwise negative.  ____________________________________________   PHYSICAL EXAM:  VITAL SIGNS: ED Triage Vitals  Enc Vitals Group     BP 08/10/15 0826 119/92 mmHg     Pulse Rate 08/10/15 0826 91     Resp 08/10/15 0826 16     Temp 08/10/15 0826 98.8 F (37.1 C)     Temp Source 08/10/15 0826 Oral     SpO2 08/10/15 0826 96 %     Weight 08/10/15 0826 130 lb (58.968 kg)     Height 08/10/15 0826  (1.676 m)     Head Cir --      Peak Flow --      Pain Score --      Pain Loc --      Pain Edu? --      Excl. in GC? --  Constitutional: Alert and oriented. Somewhat poorly kempt, but no distress. Eyes: Conjunctivae are lightly injected bilateral. PERRL. EOMI. no jaundice Head: Atraumatic. Nose: No congestion/rhinnorhea. Mouth/Throat: Mucous membranes are moist.  Oropharynx non-erythematous. Neck: No stridor.   Cardiovascular: Normal rate, regular rhythm. Grossly normal heart sounds.  Good peripheral circulation. Respiratory: Normal respiratory effort.  No retractions. Lungs CTAB. Gastrointestinal: Soft and nontender. No distention. No abdominal bruits. No CVA tenderness. Musculoskeletal: No lower extremity tenderness nor edema.  No joint effusions. Neurologic:  Normal speech and language. No gross focal neurologic deficits are appreciated. No gait instability. No tremors at this  time. Skin:  Skin is warm, dry and intact. No rash noted. Psychiatric: Mood and affect are flat and withdrawn. ____________________________________________   LABS (all labs ordered are listed, but only abnormal results are displayed)  Labs Reviewed  COMPREHENSIVE METABOLIC PANEL - Abnormal; Notable for the following:    Glucose, Bld 146 (*)    ALT 15 (*)    All other components within normal limits  ETHANOL - Abnormal; Notable for the following:    Alcohol, Ethyl (B) 13 (*)    All other components within normal limits  ACETAMINOPHEN LEVEL - Abnormal; Notable for the following:    Acetaminophen (Tylenol), Serum <10 (*)    All other components within normal limits  URINE DRUG SCREEN, QUALITATIVE ([facility] ONLY) - Abnormal; Notable for the following:    Cocaine Metabolite,Ur [place] POSITIVE (*)    Cannabinoid 50 Ng, Ur [place] POSITIVE (*)    All other components within normal limits  SALICYLATE LEVEL  CBC   ____________________________________________  EKG   ____________________________________________  RADIOLOGY   ____________________________________________   PROCEDURES  Procedure(s) performed: None  Critical Care performed: No  ____________________________________________   INITIAL IMPRESSION / ASSESSMENT AND PLAN / ED COURSE  Pertinent labs & imaging results that were available during my care of the patient were reviewed by me and considered in my medical decision making (see chart for details).  Suicidal. Also heavy alcohol abuse and cocaine polysubstance abuse. We'll send basic screening labs, check anion gap and tox screen. Anticipate consultation by psychiatry and TTS services for suicidality and polysubstance abuse. Presently medically stable in the ER without signs of acute intoxication 9:30 AM.  ----------------------------------------- 3:10 PM on 08/10/2015 -----------------------------------------  Patient remained stable. Labs reviewed, patient is medically  cleared for ongoing detox treatment and psychiatric evaluation. Ongoing care and disposition assigned Dr. Huel Cote. ____________________________________________   FINAL CLINICAL IMPRESSION(S) / ED DIAGNOSES  Final diagnoses:  Alcohol abuse  Polysubstance abuse  Severe recurrent major depressive disorder with psychotic features      Sharyn Creamer, MD 08/10/15 1511

## 2015-08-10 NOTE — Consult Note (Signed)
[facility] Consult   Reason for Consult:  Follow up Referring Physician:  ER Patient Identification: Carlos French MRN:  [id number] Principal Diagnosis: <principal problem not specified> Diagnosis:  There are no active problems to display for this patient.   Total Time spent with patient: 45 minutes  Subjective:   Carlos French is a 24 y.o. male patient employed as a Biomedical scientist at [employer] and held the job for a week and is single and lives in his truck and calls himself as Homeless. Pt comes to ER stating that he has been drinking alcohol at the rate of 2 of 40 oz beers a day and using Cocainefor a long time. Got depressed with suicidal wishes and came for help.   HPI:   As stated above and had 2 inpt hospitalizations to psychiatry for suicidal ideas and did not follow throughrecommendations made. HPI Elements:     Past Medical History:  Past Medical History  Diagnosis Date  . GI bleeding   . GERD (gastroesophageal reflux disease)   . Anxiety   . Depression   . PTSD (post-traumatic stress disorder)   . Bipolar 1 disorder   . Allergy   . GI (gastrointestinal bleed)    No past surgical history on file. Family History:  Family History  Problem Relation Age of Onset  . Stroke Mother    Social History:  History  Alcohol Use  . Yes    Comment: 4 beer cans (24 oz) every day      History  Drug Use  . Yes  . Special: Marijuana    Social History   Social History  . Marital Status: Single    Spouse Name: N/A  . Number of Children: N/A  . Years of Education: N/A   Social History Main Topics  . Smoking status: Current Every Day Smoker -- 0.75 packs/day    Types: Cigarettes  . Smokeless tobacco: None  . Alcohol Use: Yes     Comment: 4 beer cans (24 oz) every day   . Drug Use: Yes    Special: Marijuana  . Sexual Activity: Not Asked   Other Topics Concern  . None   Social History Narrative   Additional Social History:    Pain Medications: None  Reported Prescriptions: None reported Over the Counter: None Reported History of alcohol / drug use?: Yes Longest period of sobriety (when/how long): 5 Months (Rehab.) Negative Consequences of Use: Legal, Personal relationships Withdrawal Symptoms: DTs, Tremors, Nausea / Vomiting, Tingling Name of Substance 1: Cocaine 1 - Age of First Use: 15 1 - Amount (size/oz): 1 gram 1 - Frequency: 1-2x/wk 1 - Duration: 1 Month 1 - Last Use / Amount: 08/09/15 1 gram Name of Substance 2: Cannabis 2 - Age of First Use: Not Reported 2 - Amount (size/oz): "1 blunt" 2 - Frequency: Daily 2 - Duration: Since age 71 2 - Last Use / Amount: 08/09/15 1 blunt Name of Substance 3: Alcohol 3 - Age of First Use: 17 3 - Amount (size/oz): 2-3 40oz 3 - Frequency: daily 3 - Duration: Since age 54 3 - Last Use / Amount: 08/09/15- 5th of tequila, 1 pt. of vodka, 3 beers               Allergies:  No Known Allergies  Labs:  Results for orders placed or performed during the hospital encounter of 08/10/15 (from the past 48 hour(s))  Comprehensive metabolic panel     Status: Abnormal  Collection Time: 08/10/15  8:38 AM  Result Value Ref Range   Sodium 141 135 - 145 mmol/L   Potassium 3.6 3.5 - 5.1 mmol/L   Chloride 107 101 - 111 mmol/L   CO2 24 22 - 32 mmol/L   Glucose, Bld 146 (H) 65 - 99 mg/dL   BUN 11 6 - 20 mg/dL   Creatinine, Ser 1.01 0.61 - 1.24 mg/dL   Calcium 9.2 8.9 - 10.3 mg/dL   Total Protein 7.5 6.5 - 8.1 g/dL   Albumin 4.5 3.5 - 5.0 g/dL   AST 22 15 - 41 U/L   ALT 15 (L) 17 - 63 U/L   Alkaline Phosphatase 81 38 - 126 U/L   Total Bilirubin 0.3 0.3 - 1.2 mg/dL   GFR calc non Af Amer >60 >60 mL/min   GFR calc Af Amer >60 >60 mL/min    Comment: (NOTE) The eGFR has been calculated using the CKD EPI equation. This calculation has not been validated in all clinical situations. eGFR's persistently <60 mL/min signify possible Chronic Kidney Disease.    Anion gap 10 5 - 15  Ethanol (ETOH)      Status: Abnormal   Collection Time: 08/10/15  8:38 AM  Result Value Ref Range   Alcohol, Ethyl (B) 13 (H) <5 mg/dL    Comment:        LOWEST DETECTABLE LIMIT FOR SERUM ALCOHOL IS 5 mg/dL FOR MEDICAL PURPOSES ONLY   Salicylate level     Status: None   Collection Time: 08/10/15  8:38 AM  Result Value Ref Range   Salicylate Lvl <1.6 2.8 - 30.0 mg/dL  Acetaminophen level     Status: Abnormal   Collection Time: 08/10/15  8:38 AM  Result Value Ref Range   Acetaminophen (Tylenol), Serum <10 (L) 10 - 30 ug/mL    Comment:        THERAPEUTIC CONCENTRATIONS VARY SIGNIFICANTLY. A RANGE OF 10-30 ug/mL MAY BE AN EFFECTIVE CONCENTRATION FOR MANY PATIENTS. HOWEVER, SOME ARE BEST TREATED AT CONCENTRATIONS OUTSIDE THIS RANGE. ACETAMINOPHEN CONCENTRATIONS >150 ug/mL AT 4 HOURS AFTER INGESTION AND >50 ug/mL AT 12 HOURS AFTER INGESTION ARE OFTEN ASSOCIATED WITH TOXIC REACTIONS.   CBC     Status: None   Collection Time: 08/10/15  8:38 AM  Result Value Ref Range   WBC 6.9 3.8 - 10.6 K/uL   RBC 4.97 4.40 - 5.90 MIL/uL   Hemoglobin 15.9 13.0 - 18.0 g/dL   HCT 47.7 40.0 - 52.0 %   MCV 95.9 80.0 - 100.0 fL   MCH 32.1 26.0 - 34.0 pg   MCHC 33.5 32.0 - 36.0 g/dL   RDW 14.5 11.5 - 14.5 %   Platelets 212 150 - 440 K/uL  Urine Drug Screen, Qualitative ([facility] only)     Status: Abnormal   Collection Time: 08/10/15 11:22 AM  Result Value Ref Range   Tricyclic, Ur Screen NONE DETECTED NONE DETECTED   Amphetamines, Ur Screen NONE DETECTED NONE DETECTED   MDMA (Ecstasy)Ur Screen NONE DETECTED NONE DETECTED   Cocaine Metabolite,Ur [place] POSITIVE (A) NONE DETECTED   Opiate, Ur Screen NONE DETECTED NONE DETECTED   Phencyclidine (PCP) Ur S NONE DETECTED NONE DETECTED   Cannabinoid 50 Ng, Ur  [place] POSITIVE (A) NONE DETECTED   Barbiturates, Ur Screen NONE DETECTED NONE DETECTED   Benzodiazepine, Ur Scrn NONE DETECTED NONE DETECTED   Methadone Scn, Ur NONE DETECTED NONE DETECTED    Comment: (NOTE) 109   Tricyclics, urine  Cutoff 1000 ng/mL 200  Amphetamines, urine             Cutoff 1000 ng/mL 300  MDMA (Ecstasy), urine           Cutoff 500 ng/mL 400  Cocaine Metabolite, urine       Cutoff 300 ng/mL 500  Opiate, urine                   Cutoff 300 ng/mL 600  Phencyclidine (PCP), urine      Cutoff 25 ng/mL 700  Cannabinoid, urine              Cutoff 50 ng/mL 800  Barbiturates, urine             Cutoff 200 ng/mL 900  Benzodiazepine, urine           Cutoff 200 ng/mL 1000 Methadone, urine                Cutoff 300 ng/mL 1100 1200 The urine drug screen provides only a preliminary, unconfirmed 1300 analytical test result and should not be used for non-medical 1400 purposes. Clinical consideration and professional judgment should 1500 be applied to any positive drug screen result due to possible 1600 interfering substances. A more specific alternate chemical method 1700 must be used in order to obtain a confirmed analytical result.  1800 Gas chromato graphy / mass spectrometry (GC/MS) is the preferred 1900 confirmatory method.     Vitals: Blood pressure 119/92, pulse 91, temperature 98.8 F (37.1 C), temperature source Oral, resp. rate 16, height _0  (1.676 m), weight 58.968 kg (130 lb), SpO2 96 %.  Risk to Self: Suicidal Ideation: Yes-Currently Present Suicidal Intent: Yes-Currently Present Is patient at risk for suicide?: Yes Suicidal Plan?: Yes-Currently Present Specify Current Suicidal Plan: Consume an excessive amount of alcohol and cocaine Access to Means: Yes Specify Access to Suicidal Means: Access to Alcohol and cocaine What has been your use of drugs/alcohol within the last 12 months?: Alcohol daily, Cocaine 1-2x/wk, Cannabis daily How many times?: 2 Other Self Harm Risks: non Triggers for Past Attempts: Unknown Intentional Self Injurious Behavior: Burning Comment - Self Injurious Behavior: Intentional burn 2013, triggers unkown Risk to Others: Homicidal  Ideation: No Thoughts of Harm to Others: No-Not Currently Present/Within Last 6 Months Current Homicidal Intent: No Current Homicidal Plan: No Access to Homicidal Means: No Identified Victim: N/A History of harm to others?: No Assessment of Violence: None Noted Violent Behavior Description: N/A Does patient have access to weapons?: Yes (Comment) (Access to knives) Criminal Charges Pending?: No Does patient have a court date: Yes Court Date: 09/03/15 ("Child Support court") Prior Inpatient Therapy: Prior Inpatient Therapy: Yes Prior Therapy Dates: Rehab approx 5 months 2016, Detox 2016 Prior Therapy Facilty/Provider(s): [facility] Reason for Treatment: Substance Use Prior Outpatient Therapy: Prior Outpatient Therapy: No Prior Therapy Dates: N/A Prior Therapy Facilty/Provider(s): N/A Reason for Treatment: N/A Does patient have an ACCT team?: No Does patient have Intensive In-House Services?  : No Does patient have [facility] services? : No Does patient have P4CC services?: No  Current Facility-Administered Medications  Medication Dose Route Frequency Provider Last Rate Last Dose  . alum & mag hydroxide-simeth (MAALOX/MYLANTA) 200-200-20 MG/5ML suspension 15 mL  15 mL Oral Q6H PRN Delman Kitten, MD      . LORazepam (ATIVAN) tablet 0-4 mg  0-4 mg Oral 4 times per day Delman Kitten, MD   2 mg at 08/10/15 1207  . LORazepam (ATIVAN)  tablet 0-4 mg  0-4 mg Oral Q12H Delman Kitten, MD   0 mg at 08/10/15 1208  . ondansetron (ZOFRAN-ODT) disintegrating tablet 4 mg  4 mg Oral Q8H PRN Delman Kitten, MD      . thiamine (B-1) injection 100 mg  100 mg Intravenous Daily Delman Kitten, MD   0 mg at 08/10/15 1208  . thiamine (VITAMIN B-1) tablet 100 mg  100 mg Oral Daily Delman Kitten, MD   100 mg at 08/10/15 1207   Current Outpatient Prescriptions  Medication Sig Dispense Refill  . LORazepam (ATIVAN) 1 MG tablet Take 1 tablet (1 mg total) by mouth every 8 (eight) hours as needed for anxiety  (withdrawal). 15 tablet 0    Musculoskeletal: Strength & Muscle Tone: within normal limits Gait & Station: normal Patient leans: N/A  Psychiatric Specialty Exam: Physical Exam  Nursing note and vitals reviewed.   ROS  Blood pressure 119/92, pulse 91, temperature 98.8 F (37.1 C), temperature source Oral, resp. rate 16, height _0  (1.676 m), weight 58.968 kg (130 lb), SpO2 96 %.Body mass index is 20.99 kg/(m^2).  General Appearance: Casual  Eye Contact::  Fair  Speech:  Normal Rate  Volume:  Normal  Mood:  Angry, Anxious and fair  Affect:  Constricted  Thought Process:  Circumstantial  Orientation:  Full (Time, Place, and Person)  Thought Content:  Negative  Suicidal Thoughts:  Yes.  without intent/plan and manipulative.  Homicidal Thoughts:  No  Memory:  Immediate;   Fair Recent;   Fair Remote;   Fair adequate  Judgement:  Fair  Insight:  Shallow  Psychomotor Activity:  Normal  Concentration:  Fair  Recall:  [employer] of Knowledge:Fair  Language: Fair  Akathisia:  No  Handed:  Right  AIMS (if indicated):     Assets:  Communication Skills Desire for Improvement Physical Health  ADL's:  Intact  Cognition: WNL  Sleep:      Medical Decision Making: Self-Limited or Minor (1)  Treatment Plan Summary: Plan stat pt on Celexa for depresson and Trazodone to help rest and Klonopin on prn basis for anxiety . To be re-evaluated tomorrow ie 08/11/2015 for appropriate dispostion as pt stated that he wants to get help for his Substance abuse and told [facility] that if he was discharged he would hurt himself.  Plan:  No evidence of imminent risk to self or others at present.   Disposition: as above  Dewain Penning 08/10/2015 4:23 PM

## 2015-08-10 NOTE — ED Notes (Signed)
Says he has been suicidal for a few days.  hospitaliszed for same inpast.  Says he was on meds, but no longer on. Says he is homeless.  Says he tried to kill self last night by drinking large amt etoh.

## 2015-08-11 DIAGNOSIS — F314 Bipolar disorder, current episode depressed, severe, without psychotic features: Secondary | ICD-10-CM

## 2015-08-11 DIAGNOSIS — F1420 Cocaine dependence, uncomplicated: Secondary | ICD-10-CM

## 2015-08-11 DIAGNOSIS — F1220 Cannabis dependence, uncomplicated: Secondary | ICD-10-CM

## 2015-08-11 DIAGNOSIS — F1020 Alcohol dependence, uncomplicated: Secondary | ICD-10-CM

## 2015-08-11 MED ORDER — LORAZEPAM 1 MG PO TABS
ORAL_TABLET | ORAL | Status: AC
  Filled 2015-08-11: qty 1

## 2015-08-11 MED ORDER — TRAZODONE HCL 50 MG PO TABS
ORAL_TABLET | ORAL | Status: AC
  Filled 2015-08-11: qty 1

## 2015-08-11 NOTE — ED Notes (Signed)
ED BHU PLACEMENT JUSTIFICATION Is the patient under IVC or is there intent for IVC: Yes.   Is the patient medically cleared: Yes.   Is there vacancy in the ED BHU: Yes.   Is the population mix appropriate for patient: Yes.   Is the patient awaiting placement in inpatient or outpatient setting:   Has the patient had a psychiatric consult:  Consult pending    Survey of unit performed for contraband, proper placement and condition of furniture, tampering with fixtures in bathroom, shower, and each patient room: Yes.  ; Findings:  APPEARANCE/BEHAVIOR Calm and cooperative NEURO ASSESSMENT Orientation: oriented x3  Denies pain Hallucinations: No.None noted (Hallucinations) Speech: Normal Gait: normal RESPIRATORY ASSESSMENT Even  Unlabored respirations  CARDIOVASCULAR ASSESSMENT Pulses equal   regular rate  Skin warm and dry   GASTROINTESTINAL ASSESSMENT no GI complaint EXTREMITIES Full ROM  PLAN OF CARE Provide calm/safe environment. Vital signs assessed twice daily. ED BHU Assessment once each 12-hour shift. Collaborate with intake RN daily or as condition indicates. Assure the ED provider has rounded once each shift. Provide and encourage hygiene. Provide redirection as needed. Assess for escalating behavior; address immediately and inform ED provider.  Assess family dynamic and appropriateness for visitation as needed: Yes.  ; If necessary, describe findings:  Educate the patient/family about BHU procedures/visitation: Yes.  ; If necessary, describe findings:   

## 2015-08-11 NOTE — Progress Notes (Signed)
LCSW received call from [facility]idential [facility]. Patient will be interviewed at 5 pm to see if they are suitable.

## 2015-08-11 NOTE — ED Notes (Signed)
Patient assigned to appropriate care area. Patient oriented to unit/care area: Informed that, for their safety, care areas are designed for safety and monitored by security cameras at all times; and visiting hours explained to patient. Patient verbalizes understanding, and verbal contract for safety obtained. 

## 2015-08-11 NOTE — ED Notes (Signed)
Lunch provided along with an extra drink  Patient observed lying in bed with eyes closed  Even, unlabored respirations observed   NAD pt appears to be sleeping  I will continue to monitor along with every 15 minute visual observations and ongoing security camera monitoring    

## 2015-08-11 NOTE — Progress Notes (Signed)
LCSW called and left a message for Patrice at [facility] awaiting call back [phone]  LCSW called [facility]  [phone] they have a male bed/ Faxed over documentation [phone] of patient for acceptance awaiting a call back.

## 2015-08-11 NOTE — ED Notes (Addendum)
BEHAVIORAL HEALTH ROUNDING Patient sleeping: Yes.   Patient alert and oriented: sleeping Behavior appropriate: Yes.  ; If no, describe:  Nutrition and fluids offered: sleeping Toileting and hygiene offered: sleeping Sitter present: yes Law enforcement present: Yes  Pt. Noted in room. No complaints or concerns voiced. No distress or abnormal behavior noted. Will continue to monitor with security cameras. Q 15 minute rounds continue  

## 2015-08-11 NOTE — ED Notes (Signed)
BEHAVIORAL HEALTH ROUNDING Patient sleeping: Yes.   Patient alert and oriented: eyes closed  Appears asleep Behavior appropriate: Yes.  ; If no, describe:  Nutrition and fluids offered: Yes  Toileting and hygiene offered: sleeping Sitter present: q 15 minute observations and security camera monitoring Law enforcement present: yes  [facility] 

## 2015-08-11 NOTE — Consult Note (Signed)
[place] [facility] Consult   Reason for Consult:  Follow-up consult for 24 year old man with bipolar disorder and substance abuse Referring Physician:  Cinda Quest Patient Identification: Carlos French MRN:  [id number] Principal Diagnosis: Bipolar disorder, now depressed Diagnosis:   Patient Active Problem List   Diagnosis Date Noted  . Bipolar disorder, now depressed [F31.30] 08/11/2015  . Cocaine abuse [F14.10] 08/11/2015  . Alcohol abuse [F10.10] 08/11/2015  . Marijuana abuse [F12.10] 08/11/2015    Total Time spent with patient: 1 hour  Subjective:   Carlos French is a 24 y.o. male patient admitted with "I'm having suicidal ideas". Patient says that he is feeling very depressed and having suicidal thoughts.Marland Kitchen  HPI:  Patient comes to the emergency room reporting that he's been very depressed. He's been staying depressed and down and upset 4 days. He's been back using alcohol and cocaine daily ever since he got out of rehabilitation a few weeks ago. He has not been taking his prescription medicine. He says he's been living in his truck. Feels hopeless and overwhelmed. Denies that he's having any hallucinations. Doesn't feel like he has any place to turn. Sleep is poor. Appetite poor.  Past history of self-mutilation and suicide attempts. Long history of depression and mood instability. Has had at least 2 prior hospitalizations here in our hospital.  Social history: Currently has no place to live. Had been living in his vehicle. Estranged from almost everyone he knows. Not working.  Medical history: No significant active ongoing medical problems.  Substance abuse history: Has had substance abuse issues since his early teen years. Has been in multiple detoxes and has never really made much of an attempt to stay sober. No history of seizures or delirium tremens.  Family history is positive for substance abuse HPI Elements:   Quality:  Depressed mood suicidal thoughts. Severity:   Severe and life-threatening. Timing:  Worse over the past 2-3 weeks. Duration:  Ongoing. Context:  Continued substance abuse lack of outpatient treatment lack of social support.  Past Medical History:  Past Medical History  Diagnosis Date  . GI bleeding   . GERD (gastroesophageal reflux disease)   . Anxiety   . Depression   . PTSD (post-traumatic stress disorder)   . Bipolar 1 disorder   . Allergy   . GI (gastrointestinal bleed)    No past surgical history on file. Family History:  Family History  Problem Relation Age of Onset  . Stroke Mother    Social History:  History  Alcohol Use  . Yes    Comment: 4 beer cans (24 oz) every day      History  Drug Use  . Yes  . Special: Marijuana    Social History   Social History  . Marital Status: Single    Spouse Name: N/A  . Number of Children: N/A  . Years of Education: N/A   Social History Main Topics  . Smoking status: Current Every Day Smoker -- 0.75 packs/day    Types: Cigarettes  . Smokeless tobacco: None  . Alcohol Use: Yes     Comment: 4 beer cans (24 oz) every day   . Drug Use: Yes    Special: Marijuana  . Sexual Activity: Not Asked   Other Topics Concern  . None   Social History Narrative   Additional Social History:    Pain Medications: None Reported Prescriptions: None reported Over the Counter: None Reported History of alcohol / drug use?: Yes Longest period of sobriety (  when/how long): 5 Months (Rehab.) Negative Consequences of Use: Legal, Personal relationships Withdrawal Symptoms: DTs, Tremors, Nausea / Vomiting, Tingling Name of Substance 1: Cocaine 1 - Age of First Use: 15 1 - Amount (size/oz): 1 gram 1 - Frequency: 1-2x/wk 1 - Duration: 1 Month 1 - Last Use / Amount: 08/09/15 1 gram Name of Substance 2: Cannabis 2 - Age of First Use: Not Reported 2 - Amount (size/oz): "1 blunt" 2 - Frequency: Daily 2 - Duration: Since age 15 2 - Last Use / Amount: 08/09/15 1 blunt Name of Substance  3: Alcohol 3 - Age of First Use: 17 3 - Amount (size/oz): 2-3 40oz 3 - Frequency: daily 3 - Duration: Since age 7 3 - Last Use / Amount: 08/09/15- 5th of tequila, 1 pt. of vodka, 3 beers               Allergies:  No Known Allergies  Labs:  Results for orders placed or performed during the hospital encounter of 08/10/15 (from the past 48 hour(s))  Comprehensive metabolic panel     Status: Abnormal   Collection Time: 08/10/15  8:38 AM  Result Value Ref Range   Sodium 141 135 - 145 mmol/L   Potassium 3.6 3.5 - 5.1 mmol/L   Chloride 107 101 - 111 mmol/L   CO2 24 22 - 32 mmol/L   Glucose, Bld 146 (H) 65 - 99 mg/dL   BUN 11 6 - 20 mg/dL   Creatinine, Ser 1.01 0.61 - 1.24 mg/dL   Calcium 9.2 8.9 - 10.3 mg/dL   Total Protein 7.5 6.5 - 8.1 g/dL   Albumin 4.5 3.5 - 5.0 g/dL   AST 22 15 - 41 U/L   ALT 15 (L) 17 - 63 U/L   Alkaline Phosphatase 81 38 - 126 U/L   Total Bilirubin 0.3 0.3 - 1.2 mg/dL   GFR calc non Af Amer >60 >60 mL/min   GFR calc Af Amer >60 >60 mL/min    Comment: (NOTE) The eGFR has been calculated using the CKD EPI equation. This calculation has not been validated in all clinical situations. eGFR's persistently <60 mL/min signify possible Chronic Kidney Disease.    Anion gap 10 5 - 15  Ethanol (ETOH)     Status: Abnormal   Collection Time: 08/10/15  8:38 AM  Result Value Ref Range   Alcohol, Ethyl (B) 13 (H) <5 mg/dL    Comment:        LOWEST DETECTABLE LIMIT FOR SERUM ALCOHOL IS 5 mg/dL FOR MEDICAL PURPOSES ONLY   Salicylate level     Status: None   Collection Time: 08/10/15  8:38 AM  Result Value Ref Range   Salicylate Lvl <6.2 2.8 - 30.0 mg/dL  Acetaminophen level     Status: Abnormal   Collection Time: 08/10/15  8:38 AM  Result Value Ref Range   Acetaminophen (Tylenol), Serum <10 (L) 10 - 30 ug/mL    Comment:        THERAPEUTIC CONCENTRATIONS VARY SIGNIFICANTLY. A RANGE OF 10-30 ug/mL MAY BE AN EFFECTIVE CONCENTRATION FOR MANY  PATIENTS. HOWEVER, SOME ARE BEST TREATED AT CONCENTRATIONS OUTSIDE THIS RANGE. ACETAMINOPHEN CONCENTRATIONS >150 ug/mL AT 4 HOURS AFTER INGESTION AND >50 ug/mL AT 12 HOURS AFTER INGESTION ARE OFTEN ASSOCIATED WITH TOXIC REACTIONS.   CBC     Status: None   Collection Time: 08/10/15  8:38 AM  Result Value Ref Range   WBC 6.9 3.8 - 10.6 K/uL   RBC 4.97 4.40 -  5.90 MIL/uL   Hemoglobin 15.9 13.0 - 18.0 g/dL   HCT 47.7 40.0 - 52.0 %   MCV 95.9 80.0 - 100.0 fL   MCH 32.1 26.0 - 34.0 pg   MCHC 33.5 32.0 - 36.0 g/dL   RDW 14.5 11.5 - 14.5 %   Platelets 212 150 - 440 K/uL  Urine Drug Screen, Qualitative ([place] only)     Status: Abnormal   Collection Time: 08/10/15 11:22 AM  Result Value Ref Range   Tricyclic, Ur Screen NONE DETECTED NONE DETECTED   Amphetamines, Ur Screen NONE DETECTED NONE DETECTED   MDMA (Ecstasy)Ur Screen NONE DETECTED NONE DETECTED   Cocaine Metabolite,Ur [place] POSITIVE (A) NONE DETECTED   Opiate, Ur Screen NONE DETECTED NONE DETECTED   Phencyclidine (PCP) Ur S NONE DETECTED NONE DETECTED   Cannabinoid 50 Ng, Ur [place] POSITIVE (A) NONE DETECTED   Barbiturates, Ur Screen NONE DETECTED NONE DETECTED   Benzodiazepine, Ur Scrn NONE DETECTED NONE DETECTED   Methadone Scn, Ur NONE DETECTED NONE DETECTED    Comment: (NOTE) 867  Tricyclics, urine               Cutoff 1000 ng/mL 200  Amphetamines, urine             Cutoff 1000 ng/mL 300  MDMA (Ecstasy), urine           Cutoff 500 ng/mL 400  Cocaine Metabolite, urine       Cutoff 300 ng/mL 500  Opiate, urine                   Cutoff 300 ng/mL 600  Phencyclidine (PCP), urine      Cutoff 25 ng/mL 700  Cannabinoid, urine              Cutoff 50 ng/mL 800  Barbiturates, urine             Cutoff 200 ng/mL 900  Benzodiazepine, urine           Cutoff 200 ng/mL 1000 Methadone, urine                Cutoff 300 ng/mL 1100 1200 The urine drug screen provides only a preliminary, unconfirmed 1300 analytical test result and should not be  used for non-medical 1400 purposes. Clinical consideration and professional judgment should 1500 be applied to any positive drug screen result due to possible 1600 interfering substances. A more specific alternate chemical method 1700 must be used in order to obtain a confirmed analytical result.  1800 Gas chromato graphy / mass spectrometry (GC/MS) is the preferred 1900 confirmatory method.     Vitals: Blood pressure 130/78, pulse 81, temperature 97.5 F (36.4 C), temperature source Oral, resp. rate 18, height $RemoveBe'5\' 6"'OkjcSBZHw$  (1.676 m), weight 58.968 kg (130 lb), SpO2 100 %.  Risk to Self: Suicidal Ideation: Yes-Currently Present Suicidal Intent: Yes-Currently Present Is patient at risk for suicide?: Yes Suicidal Plan?: Yes-Currently Present Specify Current Suicidal Plan: Consume an excessive amount of alcohol and cocaine Access to Means: Yes Specify Access to Suicidal Means: Access to Alcohol and cocaine What has been your use of drugs/alcohol within the last 12 months?: Alcohol daily, Cocaine 1-2x/wk, Cannabis daily How many times?: 2 Other Self Harm Risks: non Triggers for Past Attempts: Unknown Intentional Self Injurious Behavior: Burning Comment - Self Injurious Behavior: Intentional burn 2013, triggers unkown Risk to Others: Homicidal Ideation: No Thoughts of Harm to Others: No-Not Currently Present/Within Last 6 Months Current Homicidal Intent: No Current Homicidal  Plan: No Access to Homicidal Means: No Identified Victim: N/A History of harm to others?: No Assessment of Violence: None Noted Violent Behavior Description: N/A Does patient have access to weapons?: Yes (Comment) (Access to knives) Criminal Charges Pending?: No Does patient have a court date: Yes Court Date: 09/03/15 ("Child Support court") Prior Inpatient Therapy: Prior Inpatient Therapy: Yes Prior Therapy Dates: Rehab approx 5 months 2016, Detox 2016 Prior Therapy Facilty/Provider(s): [facility] ([place]  [facility] Reason for Treatment: Substance Use Prior Outpatient Therapy: Prior Outpatient Therapy: No Prior Therapy Dates: N/A Prior Therapy Facilty/Provider(s): N/A Reason for Treatment: N/A Does patient have an ACCT team?: No Does patient have Intensive In-House Services?  : No Does patient have [facility] services? : No Does patient have P4CC services?: No  Current Facility-Administered Medications  Medication Dose Route Frequency Provider Last Rate Last Dose  . alum & mag hydroxide-simeth (MAALOX/MYLANTA) 200-200-20 MG/5ML suspension 15 mL  15 mL Oral Q6H PRN Delman Kitten, MD      . citalopram (CELEXA) tablet 40 mg  40 mg Oral Daily Dewain Penning, MD   40 mg at 08/11/15 1231  . LORazepam (ATIVAN) tablet 0-4 mg  0-4 mg Oral 4 times per day Delman Kitten, MD   2 mg at 08/11/15 1231  . LORazepam (ATIVAN) tablet 0-4 mg  0-4 mg Oral Q12H Delman Kitten, MD   1 mg at 08/10/15 2103  . ondansetron (ZOFRAN-ODT) disintegrating tablet 4 mg  4 mg Oral Q8H PRN Delman Kitten, MD      . thiamine (B-1) injection 100 mg  100 mg Intravenous Daily Delman Kitten, MD   0 mg at 08/10/15 1208  . thiamine (VITAMIN B-1) tablet 100 mg  100 mg Oral Daily Delman Kitten, MD   100 mg at 08/11/15 1232  . traZODone (DESYREL) tablet 50 mg  50 mg Oral QHS Dewain Penning, MD   50 mg at 08/10/15 2134   Current Outpatient Prescriptions  Medication Sig Dispense Refill  . LORazepam (ATIVAN) 1 MG tablet Take 1 tablet (1 mg total) by mouth every 8 (eight) hours as needed for anxiety (withdrawal). 15 tablet 0    Musculoskeletal: Strength & Muscle Tone: within normal limits Gait & Station: normal Patient leans: N/A  Psychiatric Specialty Exam: Physical Exam  Constitutional: He appears well-developed and well-nourished.  HENT:  Head: Normocephalic and atraumatic.  Eyes: Conjunctivae are normal. Pupils are equal, round, and reactive to light.  Neck: Normal range of motion.  Cardiovascular: Normal heart sounds.   Respiratory: Effort  normal.  GI: Soft.  Musculoskeletal: Normal range of motion.  Neurological: He is alert.  Skin: Skin is warm and dry.  Psychiatric: His mood appears anxious. His affect is blunt. His speech is delayed. He is withdrawn. Cognition and memory are impaired. He exhibits a depressed mood. He expresses suicidal ideation.    Review of Systems  Constitutional: Negative.   HENT: Negative.   Eyes: Negative.   Respiratory: Negative.   Cardiovascular: Negative.   Gastrointestinal: Negative.   Musculoskeletal: Negative.   Skin: Negative.   Neurological: Negative.   Psychiatric/Behavioral: Positive for depression, suicidal ideas, memory loss and substance abuse. Negative for hallucinations. The patient is nervous/anxious and has insomnia.     Blood pressure 130/78, pulse 81, temperature 97.5 F (36.4 C), temperature source Oral, resp. rate 18, height $RemoveBe'5\' 6"'jdimXYPmA$  (1.676 m), weight 58.968 kg (130 lb), SpO2 100 %.Body mass index is 20.99 kg/(m^2).  General Appearance: Disheveled  Eye Contact::  Fair  Speech:  Slow  Volume:  Decreased  Mood:  Anxious  Affect:  Depressed  Thought Process:  Goal Directed  Orientation:  Full (Time, Place, and Person)  Thought Content:  Negative  Suicidal Thoughts:  Yes.  with intent/plan  Homicidal Thoughts:  No  Memory:  Immediate;   Good Recent;   Fair Remote;   Good  Judgement:  Intact  Insight:  Present  Psychomotor Activity:  Decreased  Concentration:  Fair  Recall:  [employer] of Knowledge:Fair  Language: Fair  Akathisia:  No  Handed:  Right  AIMS (if indicated):     Assets:  Communication Skills Desire for Improvement Physical Health  ADL's:  Intact  Cognition: WNL  Sleep:      Medical Decision Making: Review of Psycho-Social Stressors (1), Established Problem, Worsening (2), Review or order medicine tests (1), Review of Medication Regimen & Side Effects (2) and Review of New Medication or Change in Dosage (2)  Treatment Plan Summary: Daily contact  with patient to assess and evaluate symptoms and progress in treatment, Medication management and Plan 24 year old man with a history of mood instability probably bipolar disorder who also has a long-standing complicating severe substance abuse problem. Having suicidal thoughts currently. Very overwhelmed and little support. He has been in the emergency room for a day now and is still feeling suicidal. Drug screen positive for marijuana and cocaine. Low level of alcohol. Does not appear to be on the brink of DTs. Patient will be admitted to the psychiatry ward for stabilization of his mood and further treatment planning. Restart Depakote and Seroquel that had been used with some benefit in the past.  Plan:  Recommend psychiatric Inpatient admission when medically cleared. Supportive therapy provided about ongoing stressors. Disposition: Admit as noted above  Alethia Berthold 08/11/2015 6:08 PM

## 2015-08-11 NOTE — ED Notes (Signed)
md Clapacs has consulted and he will be admitted to [facility]  Pt observed with no unusual behavior  Appropriate to stimulation  No verbalized needs or concerns at this time  NAD assessed  Continue to monitor

## 2015-08-11 NOTE — ED Notes (Signed)
Breakfast provided   Patient observed lying in bed with eyes closed  Even, unlabored respirations observed   NAD pt appears to be sleeping  I will continue to monitor along with every 15 minute visual observations and ongoing security camera monitoring    

## 2015-08-11 NOTE — ED Notes (Signed)
BEHAVIORAL HEALTH ROUNDING Patient sleeping: No. Patient alert and oriented: yes Behavior appropriate: Yes.  ; If no, describe:  Nutrition and fluids offered: yes Toileting and hygiene offered: Yes  Sitter present: q15 minute observations and security camera monitoring Law enforcement present: Yes  ODS  

## 2015-08-11 NOTE — ED Notes (Signed)
Pt. To BHU from ED ambulatory without difficulty, to room ____7_____ . Report from ___Luis _______ RN. Pt. Is alert and oriented, warm and dry in no distress. Pt. ___denies_______  SI, HI, at this time but pt is came in for SI by alcohol and cocaine. Pt. Calm and cooperative. Pt. Made aware of security cameras and Q15 minute rounds. Pt. Encouraged to let Nursing staff know of any concerns or needs.

## 2015-08-11 NOTE — ED Notes (Signed)
Pt. Noted in room. No complaints or concerns voiced. No distress or abnormal behavior noted. Will continue to monitor with security cameras. Q 15 minute rounds continue. 

## 2015-08-11 NOTE — Progress Notes (Signed)
Patient will not be accepted to [facility] because he is IVC, may be considered in 24 hours for this program.

## 2015-08-11 NOTE — ED Notes (Signed)
Pt. Noted sleeping in room. No complaints or concerns voiced. No distress or abnormal behavior noted. Will continue to monitor with security cameras. Q 15 minute rounds continue. 

## 2015-08-11 NOTE — ED Notes (Signed)
gl

## 2015-08-11 NOTE — ED Notes (Signed)

## 2015-08-11 NOTE — ED Notes (Signed)
Patient observed lying in bed with eyes closed  Even, unlabored respirations observed   NAD pt appears to be sleeping  I will continue to monitor along with every 15 minute visual observations and ongoing security camera monitoring    

## 2015-08-11 NOTE — ED Notes (Signed)
BEHAVIORAL HEALTH ROUNDING Patient sleeping: Yes.   Patient alert and oriented: sleeping Behavior appropriate: Yes.  ; If no, describe:  Nutrition and fluids offered: sleeping Toileting and hygiene offered: sleeping Sitter present: yes Law enforcement present: Yes  Pt. Noted in room. No complaints or concerns voiced. No distress or abnormal behavior noted. Will continue to monitor with security cameras. Q 15 minute rounds continue  

## 2015-08-11 NOTE — ED Notes (Signed)

## 2015-08-11 NOTE — ED Notes (Signed)
ED BHU PLACEMENT JUSTIFICATION Is the patient under IVC or is there intent for IVC: Yes.   Is the patient medically cleared: Yes.   Is there vacancy in the ED BHU: Yes.   Is the population mix appropriate for patient: Yes.   Is the patient awaiting placement in inpatient or outpatient setting: Yes.   Has the patient had a psychiatric consult: Yes.   Survey of unit performed for contraband, proper placement and condition of furniture, tampering with fixtures in bathroom, shower, and each patient room: Yes.  ; Findings:  APPEARANCE/BEHAVIOR cooperative NEURO ASSESSMENT Orientation: time and place and person Hallucinations: No.None noted (Hallucinations) Speech: Normal Gait: normal RESPIRATORY ASSESSMENT Normal expansion.  Clear to auscultation.  No rales, rhonchi, or wheezing. CARDIOVASCULAR ASSESSMENT regular rate and rhythm, S1, S2 normal, no murmur, click, rub or gallop GASTROINTESTINAL ASSESSMENT soft, nontender, BS WNL, no r/g EXTREMITIES normal strength, tone, and muscle mass PLAN OF CARE Provide calm/safe environment. Vital signs assessed twice daily. ED BHU Assessment once each 12-hour shift. Collaborate with intake RN daily or as condition indicates. Assure the ED provider has rounded once each shift. Provide and encourage hygiene. Provide redirection as needed. Assess for escalating behavior; address immediately and inform ED provider.  Educate the patient/family about BHU procedures/visitation: Yes.  ; If necessary, describe findings:

## 2015-08-11 NOTE — ED Notes (Signed)
He has been up and ambulating in the DR  He has used the telephone  Pt observed with no unusual behavior  Appropriate to stimulation  No verbalized needs or concerns at this time  NAD assessed  Continue to monitor

## 2015-08-11 NOTE — ED Notes (Signed)
BEHAVIORAL HEALTH ROUNDING Patient sleeping: Yes.   Patient alert and oriented: sleeping Behavior appropriate: Yes.  ; If no, describe:  Nutrition and fluids offered: Sleeping Toileting and hygiene offered: Sleeping Sitter present: yes Law enforcement present: Yes  Pt. Noted in room. No complaints or concerns voiced. No distress or abnormal behavior noted. Will continue to monitor with security cameras. Q 15 minute rounds continue  

## 2015-08-11 NOTE — ED Notes (Signed)
Report received from Amy Teague RN. Pt. Sleeping, respirations regular and unlabored.  Will continue to monitor for safety via security cameras and Q 15 minute checks. 

## 2015-08-12 ENCOUNTER — Inpatient Hospital Stay
Admit: 2015-08-12 | Discharge: 2015-08-16 | DRG: 885 | Disposition: A | Discharge status: Home / Self Care | Payer: No Typology Code available for payment source | Source: Intra-hospital | Attending: Psychiatry | Admitting: Psychiatry

## 2015-08-12 ENCOUNTER — Encounter: Payer: Self-pay | Admitting: Psychiatry

## 2015-08-12 DIAGNOSIS — Z823 Family history of stroke: Secondary | ICD-10-CM

## 2015-08-12 DIAGNOSIS — Z9119 Patient's noncompliance with other medical treatment and regimen: Secondary | ICD-10-CM | POA: Diagnosis present

## 2015-08-12 DIAGNOSIS — K219 Gastro-esophageal reflux disease without esophagitis: Secondary | ICD-10-CM | POA: Diagnosis present

## 2015-08-12 DIAGNOSIS — F314 Bipolar disorder, current episode depressed, severe, without psychotic features: Secondary | ICD-10-CM | POA: Diagnosis present

## 2015-08-12 DIAGNOSIS — F1220 Cannabis dependence, uncomplicated: Secondary | ICD-10-CM | POA: Diagnosis present

## 2015-08-12 DIAGNOSIS — R45851 Suicidal ideations: Secondary | ICD-10-CM | POA: Diagnosis present

## 2015-08-12 DIAGNOSIS — F17210 Nicotine dependence, cigarettes, uncomplicated: Secondary | ICD-10-CM | POA: Diagnosis present

## 2015-08-12 DIAGNOSIS — Z915 Personal history of self-harm: Secondary | ICD-10-CM | POA: Diagnosis not present

## 2015-08-12 DIAGNOSIS — F1020 Alcohol dependence, uncomplicated: Secondary | ICD-10-CM | POA: Diagnosis present

## 2015-08-12 DIAGNOSIS — F1420 Cocaine dependence, uncomplicated: Secondary | ICD-10-CM | POA: Diagnosis present

## 2015-08-12 DIAGNOSIS — F419 Anxiety disorder, unspecified: Secondary | ICD-10-CM | POA: Diagnosis present

## 2015-08-12 LAB — TSH: TSH: 0.767 u[IU]/mL (ref 0.350–4.500)

## 2015-08-12 LAB — LIPID PANEL
CHOL/HDL RATIO: 2.8 ratio
Cholesterol: 181 mg/dL (ref 0–200)
HDL: 65 mg/dL (ref >40)
LDL CALC: 100 mg/dL — AB (ref 0–99)
Triglycerides: 82 mg/dL (ref <150)
VLDL: 16 mg/dL (ref 0–40)

## 2015-08-12 MED ORDER — QUETIAPINE FUMARATE 100 MG PO TABS
100.0000 mg | ORAL_TABLET | Freq: Every day | ORAL | Status: DC
  Administered 2015-08-12: 100 mg via ORAL
  Filled 2015-08-12: qty 1

## 2015-08-12 MED ORDER — THIAMINE HCL 100 MG/ML IJ SOLN: 100.0000 mg | Freq: Every day | INTRAMUSCULAR | Status: DC

## 2015-08-12 MED ORDER — NICOTINE 10 MG IN INHA
RESPIRATORY_TRACT | Status: AC
  Filled 2015-08-12: qty 36

## 2015-08-12 MED ORDER — MAGNESIUM HYDROXIDE 400 MG/5ML PO SUSP: 30.0000 mL | Freq: Every day | ORAL | Status: DC | PRN

## 2015-08-12 MED ORDER — DIVALPROEX SODIUM 500 MG PO DR TAB
500.0000 mg | DELAYED_RELEASE_TABLET | Freq: Three times a day (TID) | ORAL | Status: DC
  Administered 2015-08-12 – 2015-08-14 (×7): 500 mg via ORAL
  Filled 2015-08-12 (×8): qty 1

## 2015-08-12 MED ORDER — ACETAMINOPHEN 325 MG PO TABS: 650.0000 mg | ORAL_TABLET | Freq: Four times a day (QID) | ORAL | Status: DC | PRN

## 2015-08-12 MED ORDER — NICOTINE 10 MG IN INHA
1.0000 | RESPIRATORY_TRACT | Status: DC | PRN
  Filled 2015-08-12: qty 36

## 2015-08-12 MED ORDER — ALUM & MAG HYDROXIDE-SIMETH 200-200-20 MG/5ML PO SUSP: 30.0000 mL | ORAL | Status: DC | PRN

## 2015-08-12 MED ORDER — VITAMIN B-1 100 MG PO TABS
100.0000 mg | ORAL_TABLET | Freq: Every day | ORAL | Status: DC
  Filled 2015-08-12: qty 1

## 2015-08-12 MED ORDER — CHLORDIAZEPOXIDE HCL 25 MG PO CAPS
25.0000 mg | ORAL_CAPSULE | Freq: Four times a day (QID) | ORAL | Status: AC
  Administered 2015-08-12 – 2015-08-15 (×12): 25 mg via ORAL
  Filled 2015-08-12 (×11): qty 1

## 2015-08-12 MED ORDER — NICOTINE 14 MG/24HR TD PT24
14.0000 mg | MEDICATED_PATCH | Freq: Every day | TRANSDERMAL | Status: DC
  Administered 2015-08-12 – 2015-08-16 (×5): 14 mg via TRANSDERMAL
  Filled 2015-08-12 (×5): qty 1

## 2015-08-12 NOTE — ED Notes (Signed)

## 2015-08-12 NOTE — ED Notes (Signed)
Pt. Noted sleeping in room. No complaints or concerns voiced. No distress or abnormal behavior noted. Will continue to monitor with security cameras. Q 15 minute rounds continue. 

## 2015-08-12 NOTE — BHH Counselor (Signed)
[employer] Enrollment completed and submitted. STR# [id number]    Davina Poke, LCSW Therapeutic Triage Specialist [facility] 08/12/2015 12:48 PM

## 2015-08-12 NOTE — ED Notes (Signed)
BEHAVIORAL HEALTH ROUNDING Patient sleeping: No. Patient alert and oriented: yes Behavior appropriate: Yes.  ; If no, describe:  Nutrition and fluids offered: yes Toileting and hygiene offered: Yes  Sitter present: q15 minute observations and security camera monitoring Law enforcement present: Yes  ODS  

## 2015-08-12 NOTE — ED Notes (Signed)
Breakfast was given to patient. 

## 2015-08-12 NOTE — Tx Team (Signed)
Initial Interdisciplinary Treatment Plan   PATIENT STRESSORS: Financial difficulties Legal issue Marital or family conflict Substance abuse Traumatic event   PATIENT STRENGTHS: Active sense of humor Capable of independent living Communication skills Physical Health Work skills   PROBLEM LIST: Problem List/Patient Goals Date to be addressed Date deferred Reason deferred Estimated date of resolution  Depression  8/18           Substance Abuse 8/18                                          DISCHARGE CRITERIA:  Ability to meet basic life and health needs Adequate post-discharge living arrangements Improved stabilization in mood, thinking, and/or behavior Motivation to continue treatment in a less acute level of care Verbal commitment to aftercare and medication compliance  PRELIMINARY DISCHARGE PLAN: Attend aftercare/continuing care group Attend 12-step recovery group Placement in alternative living arrangements  PATIENT/FAMIILY INVOLVEMENT: This treatment plan has been presented to and reviewed with the patient, Raelene Bott, and/or family member, .  The patient and family have been given the opportunity to ask questions and make suggestions.  Ignacia Felling 08/12/2015, 6:28 PM

## 2015-08-12 NOTE — ED Provider Notes (Signed)
-----------------------------------------   5:26 AM on 08/12/2015 -----------------------------------------  No acute events overnight. Patient has been seen by psychiatry and they recommended inpatient admission. Labs have resulted cocaine and cannabinoids positive, otherwise largely within normal limits. Currently awaiting psychiatric admission.  Minna Antis, MD 08/12/15 [phone]

## 2015-08-12 NOTE — ED Notes (Signed)
Pt. Noted in room. No complaints or concerns voiced. No distress or abnormal behavior noted. Will continue to monitor with security cameras. Q 15 minute rounds continue. 

## 2015-08-12 NOTE — BHH Counselor (Signed)
Pt. is to be admitted to [facility] by Dr. Toni Amend. Attending Physician will be Dr. Jennet Maduro.  Pt. has been assigned to room 319A, by [facility] Charge Nurse Minerva Areola.  Intake Paper Work has been signed and placed on pt. chart. ER staffhave been made aware of the admission.

## 2015-08-12 NOTE — ED Notes (Signed)
Breakfast provided  Pt observed with no unusual behavior  Appropriate to stimulation  No verbalized needs or concerns at this time  NAD assessed  Continue to monitor 

## 2015-08-12 NOTE — ED Notes (Signed)
ED BHU PLACEMENT JUSTIFICATION Is the patient under IVC or is there intent for IVC:  yes   Is the patient medically cleared: Yes.   Is there vacancy in the ED BHU: Yes.   Is the population mix appropriate for patient: Yes.   Is the patient awaiting placement in inpatient or outpatient setting: Yes.   [facility]  Has the patient had a psychiatric consult: Yes.   Survey of unit performed for contraband, proper placement and condition of furniture, tampering with fixtures in bathroom, shower, and each patient room: Yes.  ; Findings:  APPEARANCE/BEHAVIOR Calm and cooperative NEURO ASSESSMENT Orientation: oriented x3  Denies pain Hallucinations: No.None noted (Hallucinations) Speech: Normal Gait: normal RESPIRATORY ASSESSMENT Even  Unlabored respirations  CARDIOVASCULAR ASSESSMENT Pulses equal   regular rate  Skin warm and dry   GASTROINTESTINAL ASSESSMENT no GI complaint EXTREMITIES Full ROM  PLAN OF CARE Provide calm/safe environment. Vital signs assessed twice daily. ED BHU Assessment once each 12-hour shift. Collaborate with intake RN daily or as condition indicates. Assure the ED provider has rounded once each shift. Provide and encourage hygiene. Provide redirection as needed. Assess for escalating behavior; address immediately and inform ED provider.  Assess family dynamic and appropriateness for visitation as needed: Yes.  ; If necessary, describe findings:  Educate the patient/family about BHU procedures/visitation: Yes.  ; If necessary, describe findings:

## 2015-08-12 NOTE — ED Notes (Addendum)
Report called to Victorino Dike RN by Claris Che RN    1/1 bags of belongings to be transferred to [facility] BMU with the pt

## 2015-08-12 NOTE — BHH Counselor (Signed)
Completed TAR for patient.     Davina Poke, LCSW Therapeutic Triage Specialist [facility] 08/12/2015 6:28 PM

## 2015-08-12 NOTE — ED Notes (Signed)
Pt observed with no unusual behavior  Appropriate to stimulation  No verbalized needs or concerns at this time  NAD assessed  Continue to monitor 

## 2015-08-12 NOTE — ED Notes (Signed)
Patient observed lying in bed with eyes closed  Even, unlabored respirations observed   NAD pt appears to be sleeping  I will continue to monitor along with every 15 minute visual observations and ongoing security camera monitoring    

## 2015-08-12 NOTE — ED Notes (Signed)
Lunch provided along with an extra drink  Pt observed with no unusual behavior  Appropriate to stimulation  No verbalized needs or concerns at this time  NAD assessed  Continue to monitor 

## 2015-08-12 NOTE — Progress Notes (Signed)
Patient with appropriate affect and cooperative behavior with admission interview, assessment, unit tour and current plan of care. Skin check performed with no wounds or bruises. Tattoos bilateral arms, legs and back left shoulder. Denies SI/HI at this time. Verbalizes needs appropriately. Dinner tray ordered. Safety maintained.

## 2015-08-12 NOTE — Progress Notes (Signed)
LCSW met with director Arsenio Katz [place]) [phone] of outreach services for [facility] 18 month residential treatment program. He met with patient who was briefly assessed and will be evaluated and they will contact the patient in BMU if accepted to their program. LCSW  Will have patient complete questionnaire and faxed it over once completed. Fax # [phone]

## 2015-08-13 DIAGNOSIS — F314 Bipolar disorder, current episode depressed, severe, without psychotic features: Principal | ICD-10-CM

## 2015-08-13 LAB — HEMOGLOBIN A1C: Hgb A1c MFr Bld: 5 % (ref 4.0–6.0)

## 2015-08-13 MED ORDER — QUETIAPINE FUMARATE 100 MG PO TABS
100.0000 mg | ORAL_TABLET | Freq: Three times a day (TID) | ORAL | Status: DC
  Administered 2015-08-13 – 2015-08-16 (×7): 100 mg via ORAL
  Filled 2015-08-13 (×6): qty 1

## 2015-08-13 MED ORDER — QUETIAPINE FUMARATE 200 MG PO TABS
200.0000 mg | ORAL_TABLET | Freq: Every day | ORAL | Status: DC
  Filled 2015-08-13 (×4): qty 1

## 2015-08-13 MED ORDER — QUETIAPINE FUMARATE 100 MG PO TABS
400.0000 mg | ORAL_TABLET | Freq: Every day | ORAL | Status: DC
  Administered 2015-08-13 – 2015-08-15 (×3): 400 mg via ORAL
  Filled 2015-08-13: qty 2
  Filled 2015-08-13 (×2): qty 4

## 2015-08-13 NOTE — [facility] Group Notes (Signed)
[facility] LCSW Aftercare Discharge Planning Group Note   08/13/2015 11:24 AM  Participation Quality:  Did not attend.   Alanys Godino L Keshon Markovitz MSW, LCSWA  

## 2015-08-13 NOTE — [facility] Suicide Risk Assessment (Signed)
Carlos French   Nursing information obtained from:  Patient Demographic factors:  Male, Adolescent or young adult, Living alone Current Mental Status:  Suicidal ideation indicated by patient (prior to admission ) Loss Factors:  Loss of significant relationship, Decrease in vocational status, Legal issues, Financial problems / change in socioeconomic status Historical Factors:  Prior suicide attempts, Impulsivity Risk Reduction Factors:  NA Total Time spent with patient: 1 hour Principal Problem: Bipolar disorder, current episode depressed, severe, without psychotic features Diagnosis:   Patient Active Problem List   Diagnosis Date Noted  . Bipolar disorder, current episode depressed, severe, without psychotic features [F31.4] 08/11/2015  . Cocaine use disorder, severe, dependence [F14.20] 08/11/2015  . Alcohol use disorder, severe, dependence [F10.20] 08/11/2015  . Cannabis use disorder, severe, dependence [F12.20] 08/11/2015     Continued Clinical Symptoms:  Alcohol Use Disorder Identification Test Final Score (AUDIT): 21 The "Alcohol Use Disorders Identification Test", Guidelines for Use in Primary Care, Second Edition.  World Science writer [facility]). Score between 0-7:  no or low risk or alcohol related problems. Score between 8-15:  moderate risk of alcohol related problems. Score between 16-19:  high risk of alcohol related problems. Score 20 or above:  warrants further diagnostic evaluation for alcohol dependence and treatment.   CLINICAL FACTORS:   Bipolar Disorder:   Depressive phase Alcohol/Substance Abuse/Dependencies   Musculoskeletal: Strength & Muscle Tone: within normal limits Gait & Station: normal Patient leans: N/A  Psychiatric Specialty Exam: Physical Exam  Nursing note and vitals reviewed. Constitutional: He appears well-developed and well-nourished.  HENT:  Head: Normocephalic and atraumatic.  Eyes: Conjunctivae and EOM are  normal. Pupils are equal, round, and reactive to light.  Neck: Normal range of motion. Neck supple.  Cardiovascular: Normal rate, regular rhythm and normal heart sounds.   Respiratory: Effort normal and breath sounds normal.  GI: Soft. Bowel sounds are normal.  Musculoskeletal: Normal range of motion.  Neurological: He is alert. He has normal reflexes.  Skin: Skin is warm and dry.    Review of Systems  Psychiatric/Behavioral: Positive for depression and substance abuse. The patient is nervous/anxious.   All other systems reviewed and are negative.   Blood pressure 108/75, pulse 81, temperature 98.2 F (36.8 C), temperature source Oral, resp. rate 18, height 5\' 6"  (1.676 m), weight 56.7 kg (125 lb), SpO2 99 %.Body mass index is 20.19 kg/(m^2).  General Appearance: Casual  Eye Contact::  Fair  Speech:  Clear and Coherent  Volume:  Normal  Mood:  Depressed and Hopeless  Affect:  Flat  Thought Process:  Goal Directed  Orientation:  Full (Time, Place, and Person)  Thought Content:  WDL  Suicidal Thoughts:  Yes.  without intent/plan  Homicidal Thoughts:  No  Memory:  Immediate;   Fair Recent;   Fair Remote;   Fair  Judgement:  Fair  Insight:  Fair  Psychomotor Activity:  Normal  Concentration:  Fair  Recall:  [employer] of Knowledge:Fair  Language: Fair  Akathisia:  No  Handed:  Right  AIMS (if indicated):     Assets:  Communication Skills Desire for Improvement  Sleep:  Number of Hours: 7  Cognition: WNL  ADL's:  Intact     COGNITIVE FEATURES THAT CONTRIBUTE TO RISK:  None    SUICIDE RISK:   Moderate:  Frequent suicidal ideation with limited intensity, and duration, some specificity in terms of plans, no associated intent, good self-control, limited dysphoria/symptomatology, some risk factors present, and identifiable  protective factors, including available and accessible social support.  PLAN OF CARE: Hospital admission, medication management, substance abuse  counseling, discharge planning.  Medical Decision Making:  New problem, with additional work up planned, Review of Psycho-Social Stressors (1), Review or order clinical lab tests (1), Review of Medication Regimen & Side Effects (2) and Review of New Medication or Change in Dosage (2)   Carlos French is a 24 year old male with a history of bipolar disorder and alcoholism admitted for suicidal ideation in the context of treatment noncompliance and relapse on alcohol.  1 suicidal ideation. The patient is able to contract for safety in the hospital.  2. Mood. He was restarted on Depakote and Seroquel for mood stabilization. We will increase Seroquel to 200 mg tonight  3. Alcohol detox. He was placed on Librium taper.  4. Smoking. Nicotine products are available.  5. Substance abuse treatment. The patient was just discharged from six-month program in [place]. He relapsed on alcohol immediately. He is considered by [facility] house program.  6. Disposition. To be established.    I certify that inpatient services furnished can reasonably be expected to improve the patient's condition.   Carlos French 08/13/2015, 6:00 PM

## 2015-08-13 NOTE — Progress Notes (Signed)
Recreation Therapy Notes  INPATIENT RECREATION THERAPY ASSESSMENT  Patient Details Name: Carlos French MRN: [id number] DOB: Oct 20, 1991 Today's Date: 08/13/2015  Patient Stressors: Family, Death, Friends, Work, Other (Comment) (Lack of friends who are a good influence; homeless; not using substances has been stressful)  Coping Skills:   Isolate, Arguments, Substance Abuse, Exercise, Art/Dance, Talking, Music, Sports  Personal Challenges: Anger, Concentration, Decision-Making, Problem-Solving, Stress Management, Substance Abuse, Trusting Others, Work Nutritional therapist (2+):  Music - Risk manager, Individual - Other (Comment) (Play and watch football)  Awareness of Community Resources:  Yes  Community Resources:  [employer], Other (Comment) (Skateboarding park)  Current Use: No  If no, Barriers?: Other (Comment) (Caught up in other things)  Patient Strengths:  Personality, sense of humor  Patient Identified Areas of Improvement:  Anger  Current Recreation Participation:  Drugs  Patient Goal for Hospitalization:  "I don't know"  [place] of Residence:  [place] of Residence:  [place]   Current SI (including self-harm):  No ("Not right now")  Current HI:  No  Consent to Intern Participation: N/A   Jacquelynn Cree, LRT/CTRS 08/13/2015, 5:22 PM

## 2015-08-13 NOTE — Progress Notes (Signed)
Initial Nutrition Assessment   INTERVENTION:   Meals and Snacks: Cater to patient preferences Medical Food Supplement Therapy: add Carnation Instant Breakfast BID with meals  NUTRITION DIAGNOSIS:   Inadequate oral intake related to poor appetite as evidenced by per patient/family report.  GOAL:   Patient will meet greater than or equal to 90% of their needs  MONITOR:    (Energy Intake, Anthropometrics, Electrolyte/Renal Profile, Glucose Profile, Digestive System)  REASON FOR ASSESSMENT:   Malnutrition Screening Tool    ASSESSMENT:    Pt admitted with bipolar disorder, current episode depressed, severe   Past Medical History  Diagnosis Date  . GI bleeding   . GERD (gastroesophageal reflux disease)   . Anxiety   . Depression   . PTSD (post-traumatic stress disorder)   . Bipolar 1 disorder   . Allergy   . GI (gastrointestinal bleed)      Diet Order:  Diet regular Room service appropriate?: Yes; Fluid consistency:: Thin   Energy Intake: recorded po intake 80% at breakfast this AM, 100% at dinner; noted poor appetite reported prior to admission  Electrolyte and Renal Profile:  Recent Labs Lab 08/10/15 0838  BUN 11  CREATININE 1.01  NA 141  K 3.6   Glucose Profile: No results for input(s): GLUCAP in the last 72 hours. Protein Profile:  Recent Labs Lab 08/10/15 0838  ALBUMIN 4.5   Meds: reviewed  Height:   Ht Readings from Last 1 Encounters:  08/12/15  (1.676 m)    Weight: noted weight gain since 2015 per weight encounters  Wt Readings from Last 1 Encounters:  08/12/15 125 lb (56.7 kg)   Wt Readings from Last 10 Encounters:  08/12/15 125 lb (56.7 kg)  08/10/15 130 lb (58.968 kg)  07/29/15 135 lb (61.236 kg)  07/27/15 135 lb (61.236 kg)  10/11/14 120 lb (54.432 kg)  07/22/14 112 lb 9.6 oz (51.075 kg)  07/14/14 110 lb (49.896 kg)    BMI:  Body mass index is 20.19 kg/(m^2).  LOW Care Level  Romelle Starcher MS, [place], LDN [phone]  Pager

## 2015-08-13 NOTE — H&P (Addendum)
Psychiatric Admission Assessment Adult  Patient Identification: KRON EVERTON MRN:  [id number] Date of Evaluation:  08/13/2015 Chief Complaint:  Bipolar  Substance Abuse Principal Diagnosis: Bipolar disorder, current episode depressed, severe, without psychotic features Diagnosis:   Patient Active Problem List   Diagnosis Date Noted  . Bipolar disorder, current episode depressed, severe, without psychotic features [F31.4] 08/11/2015  . Cocaine use disorder, severe, dependence [F14.20] 08/11/2015  . Alcohol use disorder, severe, dependence [F10.20] 08/11/2015  . Cannabis use disorder, severe, dependence [F12.20] 08/11/2015   History of Present Illness::   Identifying data. Mr. Hubbert is a 24 year old male with a history of bipolar illness and alcoholism.  Chief complaint. "I need help."  History of present illness. Mr. Umbach has a long history of depression, mood instability, and use. He participated in substance abuse treatment program in [place] and was able to maintain sobriety for 6 months while enrolled. He left the program 3 weeks ago. He relapsed on alcohol immediately. He became increasingly depressed with poor sleep and decreased appetite, anhedonia, feeling of guilt and hopelessness worthlessness, poor energy and concentration, social isolation, crying spells, heightened anxiety. He is drinking escalated. He developed suicidal ideation with a plan to use substances. He decided to come to the hospital for help. He denies psychotic symptoms. He denies symptoms suggestive of bipolar mania. In addition to alcohol is been using cocaine and cannabinoids.  Past psychiatric history. There were several admissions at least 2 to [facility] for mood instability and suicidal ideation and alcoholism. There is a history of self-injurious behavior. He has not been taking any medications lately. In the past he did well on a combination of Depakote and Seroquel.  Total Time  spent with patient: 1 hour  Past Medical History:  Past Medical History  Diagnosis Date  . GI bleeding   . GERD (gastroesophageal reflux disease)   . Anxiety   . Depression   . PTSD (post-traumatic stress disorder)   . Bipolar 1 disorder   . Allergy   . GI (gastrointestinal bleed)    History reviewed. No pertinent past surgical history. Family History:  Family History  Problem Relation Age of Onset  . Stroke Mother    Social History:  History  Alcohol Use  . Yes    Comment: 4 beer cans (24 oz) every day      History  Drug Use  . Yes  . Special: Marijuana    Social History   Social History  . Marital Status: Single    Spouse Name: N/A  . Number of Children: N/A  . Years of Education: N/A   Social History Main Topics  . Smoking status: Current Every Day Smoker -- 0.75 packs/day    Types: Cigarettes  . Smokeless tobacco: None  . Alcohol Use: Yes     Comment: 4 beer cans (24 oz) every day   . Drug Use: Yes    Special: Marijuana  . Sexual Activity: Not Asked   Other Topics Concern  . None   Social History Narrative   Additional Social History:    Pain Medications: None Reported Prescriptions: None reported Over the Counter: None Reported History of alcohol / drug use?: Yes Longest period of sobriety (when/how long): few months in rehab  Negative Consequences of Use: Personal relationships, Surveyor, quantity, Work / Programmer, multimedia, Armed forces operational officer Name of Substance 1: Cocaine 1 - Age of First Use: 15 1 - Amount (size/oz): 1 gram 1 - Frequency: 1-2x/wk 1 - Duration: 1 Month 1 -  Last Use / Amount: 08/09/15 1 gram Name of Substance 2: Cannabis 2 - Age of First Use: Not Reported 2 - Amount (size/oz): "1 blunt" 2 - Frequency: Daily 2 - Duration: Since age 73 2 - Last Use / Amount: 08/09/15 1 blunt Name of Substance 3: Alcohol 3 - Age of First Use: 17 3 - Amount (size/oz): 2-3 40oz 3 - Frequency: daily 3 - Duration: Since age 61 3 - Last Use / Amount: 08/09/15- 5th of tequila, 1  pt. of vodka, 3 beers               Musculoskeletal: Strength & Muscle Tone: within normal limits Gait & Station: normal Patient leans: N/A  Psychiatric Specialty Exam: Physical Exam  Nursing note and vitals reviewed.   Review of Systems  All other systems reviewed and are negative.   Blood pressure 108/75, pulse 81, temperature 98.2 F (36.8 C), temperature source Oral, resp. rate 18, height 5\' 6"  (1.676 m), weight 56.7 kg (125 lb), SpO2 99 %.Body mass index is 20.19 kg/(m^2).  See SRA.                                                  Sleep:  Number of Hours: 7   Risk to Self: Is patient at risk for suicide?: No (not at this time ) Risk to Others:   Prior Inpatient Therapy:   Prior Outpatient Therapy:    Alcohol Screening: 1. How often do you have a drink containing alcohol?: 4 or more times a week 2. How many drinks containing alcohol do you have on a typical day when you are drinking?: 3 or 4 3. How often do you have six or more drinks on one occasion?: Weekly Preliminary Score: 4 4. How often during the last year have you found that you were not able to stop drinking once you had started?: Weekly 5. How often during the last year have you failed to do what was normally expected from you becasue of drinking?: Weekly 6. How often during the last year have you needed a first drink in the morning to get yourself going after a heavy drinking session?: Less than monthly 7. How often during the last year have you had a feeling of guilt of remorse after drinking?: Weekly 8. How often during the last year have you been unable to remember what happened the night before because you had been drinking?: Less than monthly 9. Have you or someone else been injured as a result of your drinking?: No 10. Has a relative or friend or a doctor or another health worker been concerned about your drinking or suggested you cut down?: Yes, but not in the last year Alcohol  Use Disorder Identification Test Final Score (AUDIT): 21 Brief Intervention: Patient declined brief intervention  Allergies:  No Known Allergies Lab Results:  Results for orders placed or performed during the hospital encounter of 08/12/15 (from the past 48 hour(s))  Hemoglobin A1c     Status: None   Collection Time: 08/12/15  5:19 PM  Result Value Ref Range   Hgb A1c MFr Bld 5.0 4.0 - 6.0 %  Lipid panel, fasting     Status: Abnormal   Collection Time: 08/12/15  5:19 PM  Result Value Ref Range   Cholesterol 181 0 - 200 mg/dL   Triglycerides 82 <782  mg/dL   HDL 65 >16 mg/dL   Total CHOL/HDL Ratio 2.8 RATIO   VLDL 16 0 - 40 mg/dL   LDL Cholesterol 109 (H) 0 - 99 mg/dL    Comment:        Total Cholesterol/HDL:CHD Risk Coronary Heart Disease Risk Table                     Men   Women  1/2 Average Risk   3.4   3.3  Average Risk       5.0   4.4  2 X Average Risk   9.6   7.1  3 X Average Risk  23.4   11.0        Use the calculated Patient Ratio above and the CHD Risk Table to determine the patient's CHD Risk.        ATP III CLASSIFICATION (LDL):  <100     mg/dL   Optimal  604-540  mg/dL   Near or Above                    Optimal  130-159  mg/dL   Borderline  981-191  mg/dL   High  >478     mg/dL   Very High   TSH     Status: None   Collection Time: 08/12/15  5:19 PM  Result Value Ref Range   TSH 0.767 0.350 - 4.500 uIU/mL   Current Medications: Current Facility-Administered Medications  Medication Dose Route Frequency Provider Last Rate Last Dose  . acetaminophen (TYLENOL) tablet 650 mg  650 mg Oral Q6H PRN Audery Amel, MD      . alum & mag hydroxide-simeth (MAALOX/MYLANTA) 200-200-20 MG/5ML suspension 30 mL  30 mL Oral Q4H PRN Audery Amel, MD      . chlordiazePOXIDE (LIBRIUM) capsule 25 mg  25 mg Oral QID Shari Prows, MD   25 mg at 08/13/15 1719  . divalproex (DEPAKOTE) DR tablet 500 mg  500 mg Oral 3 times per day Audery Amel, MD   500 mg at 08/13/15  1348  . magnesium hydroxide (MILK OF MAGNESIA) suspension 30 mL  30 mL Oral Daily PRN Audery Amel, MD      . nicotine (NICODERM CQ - dosed in mg/24 hours) patch 14 mg  14 mg Transdermal Daily Cordarro Spinnato B Hai Grabe, MD   14 mg at 08/13/15 1013  . nicotine (NICOTROL) 10 MG inhaler 1 continuous puffing  1 continuous puffing Inhalation PRN Emmaline Wahba B Shawan Tosh, MD      . QUEtiapine (SEROQUEL) tablet 200 mg  200 mg Oral QHS Zyra Parrillo B Theresa Wedel, MD       PTA Medications: No prescriptions prior to admission    Previous Psychotropic Medications: Yes   Substance Abuse History in the last 12 months:  Yes.      Consequences of Substance Abuse: Negative  Results for orders placed or performed during the hospital encounter of 08/12/15 (from the past 72 hour(s))  Hemoglobin A1c     Status: None   Collection Time: 08/12/15  5:19 PM  Result Value Ref Range   Hgb A1c MFr Bld 5.0 4.0 - 6.0 %  Lipid panel, fasting     Status: Abnormal   Collection Time: 08/12/15  5:19 PM  Result Value Ref Range   Cholesterol 181 0 - 200 mg/dL   Triglycerides 82 <295 mg/dL   HDL 65 >62 mg/dL   Total CHOL/HDL Ratio 2.8 RATIO  VLDL 16 0 - 40 mg/dL   LDL Cholesterol 161 (H) 0 - 99 mg/dL    Comment:        Total Cholesterol/HDL:CHD Risk Coronary Heart Disease Risk Table                     Men   Women  1/2 Average Risk   3.4   3.3  Average Risk       5.0   4.4  2 X Average Risk   9.6   7.1  3 X Average Risk  23.4   11.0        Use the calculated Patient Ratio above and the CHD Risk Table to determine the patient's CHD Risk.        ATP III CLASSIFICATION (LDL):  <100     mg/dL   Optimal  096-045  mg/dL   Near or Above                    Optimal  130-159  mg/dL   Borderline  409-811  mg/dL   High  >914     mg/dL   Very High   TSH     Status: None   Collection Time: 08/12/15  5:19 PM  Result Value Ref Range   TSH 0.767 0.350 - 4.500 uIU/mL    Observation Level/Precautions:  15 minute checks   Laboratory:  CBC Chemistry Profile UDS UA  Psychotherapy:    Medications:    Consultations:    Discharge Concerns:    Estimated LOS:  Other:     Psychological Evaluations: No   Treatment Plan Summary: Daily contact with patient to assess and evaluate symptoms and progress in treatment and Medication management  Medical Decision Making:  New problem, with additional work up planned, Review of Psycho-Social Stressors (1), Review or order clinical lab tests (1), Review of Medication Regimen & Side Effects (2) and Review of New Medication or Change in Dosage (2)   Mr. Denomme is a 24 year old male with a history of bipolar disorder and alcoholism admitted for suicidal ideation in the context of treatment noncompliance and relapse on alcohol.  1 suicidal ideation. The patient is able to contract for safety in the hospital.  2. Mood. He was restarted on Depakote and Seroquel for mood stabilization. We will increase Seroquel to 400 mg tonight with additional dose throughout the day for anxiety.  3. Alcohol detox. He was placed on Librium taper.  4. Smoking. Nicotine products are available.  5. Substance abuse treatment. The patient was just discharged from six-month program in [place]. He relapsed on alcohol immediately. He is not interested in [facility]. He is not interested in [facility] rehabilitation participation. He wants to do with outpatient. He spoke to Unk Pinto about [facility] IOP program.   6. Disposition. To be established.  I certify that inpatient services furnished can reasonably be expected to improve the patient's condition.   Tymir Terral 8/19/20167:59 PM

## 2015-08-13 NOTE — Progress Notes (Signed)
Recreation Therapy Notes  Date: 08.19.16 Time: 3:00 pm Location: Craft Room  Group Topic: Problem solving, communication, teamwork  Goal Area(s) Addresses:  Patient will work in teams towards shared goal. Patient will verbalize skills needed to make activity successful. Patient will verbalize benefit of using skills identified to reach post d/c goals.  Behavioral Response: Attentive, Interactive   Intervention: Landing Pad  Activity: Patients were divided into two teams and given 15 straws and approximately 2.5 feet of tape. Patients  instructed to build a landing pad to catch a golf ball that would be dropped from approximately 4 feet.  Education: LRT educated patients on why communication, problem solving, and teamwork is important.  Education Outcome: Acknowledges education/In group clarification offered/Needs additional education.   Clinical Observations/Feedback: Patient worked with team to build landing pad. Patient used effective communication, problem solving, and teamwork skills. Patient contributed to group discussion by stating what skills were used during group.  Jacquelynn Cree, LRT/CTRS 08/13/2015 5:04 PM

## 2015-08-13 NOTE — Progress Notes (Signed)
D: Pt is awake and active in the milieu this evening. Pt mood is depressed and his affect is sad. Pt forwards little to staff and is somewhat isolative, although he denies SI/Hi and AVH.   A: Writer provided emotional support and administered medications as prescribed.   R: Pt is spending time watching TV in the dayroom but has very little interaction with staff and peers. Pt behavior is appropriate on the unit, and he went to bed following evening snack and medication administration.

## 2015-08-13 NOTE — [facility] Group Notes (Signed)
[facility] Group Notes:  (Nursing/MHT/Case Management/Adjunct)  Date:  08/13/2015  Time:  1:45 PM  Type of Therapy:  Group Therapy  Participation Level:  Active  Participation Quality:  Appropriate  Affect:  Appropriate  Cognitive:  Appropriate  Insight:  Good  Engagement in Group:  Engaged  Modes of Intervention:  Support  Summary of Progress/Problems:  Carlos French 08/13/2015, 1:45 PM

## 2015-08-13 NOTE — Progress Notes (Signed)
D: Pt is awake and active in the milieu today.. Pt mood is depressed and his affect is anxious. Pt spoke to this staff member about his alcoholism and his time at "[facility]" treatment center in [place].   denies SI/Hi and AVH.   A: Writer provided emotional support and administered medications as prescribed.   R: Pt is spending time watching TV in the dayroom went outside today and played basketball.. Pt behavior is appropriate on the unit,

## 2015-08-14 MED ORDER — SERTRALINE HCL 100 MG PO TABS
100.0000 mg | ORAL_TABLET | Freq: Every day | ORAL | Status: DC
  Administered 2015-08-14 – 2015-08-16 (×3): 100 mg via ORAL
  Filled 2015-08-14 (×3): qty 1

## 2015-08-14 NOTE — Progress Notes (Signed)
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 Principal Problem: Bipolar disorder, current episode depressed, severe, without psychotic features Diagnosis:   Patient Active Problem List   Diagnosis Date Noted  . Bipolar disorder, current episode depressed, severe, without psychotic features [F31.4] 08/11/2015  . Cocaine use disorder, severe, dependence [F14.20] 08/11/2015  . Alcohol use disorder, severe, dependence [F10.20] 08/11/2015  . Cannabis use disorder, severe, dependence [F12.20] 08/11/2015   Total Time spent with patient: 30 minutes   Past Medical History:  Past Medical History  Diagnosis Date  . GI bleeding   . GERD (gastroesophageal reflux disease)   . Anxiety   . Depression   . PTSD (post-traumatic stress disorder)   . Bipolar 1 disorder   . Allergy   . GI (gastrointestinal bleed)    History reviewed. No pertinent past surgical history. Family History:  Family History  Problem Relation Age of Onset  . Stroke Mother    Social History:  History  Alcohol Use  . Yes    Comment: 4 beer cans (24 oz) every day      History  Drug Use  . Yes  . Special: Marijuana    Social History   Social History  . Marital Status: Single    Spouse Name: N/A  . Number of Children: N/A  . Years of Education: N/A   Social History Main Topics  . Smoking status: Current Every Day Smoker -- 0.75 packs/day    Types: Cigarettes  . Smokeless tobacco: None  . Alcohol Use: Yes     Comment: 4 beer cans (24 oz) every day   . Drug Use: Yes    Special: Marijuana  . Sexual Activity: Not Asked   Other Topics Concern  . None   Social History Narrative   Additional History:    Sleep: Poor  Appetite:  Fair   Assessment: Patient continues to be  very withdrawn. Affect dysphoric and irritable. He denies suicidal ideation. Not really very cooperative. He is very focused on trying to be prescribed clonazepam. Insight is questionable. No sign of hyperactivity or hyper verbality. Denies acute suicidal thoughts. Only partial improvement.  Musculoskeletal: Strength & Muscle Tone: within normal limits Gait & Station: normal Patient leans: N/A   Psychiatric Specialty Exam: Physical Exam  Nursing note and vitals reviewed. Constitutional: He appears well-developed and well-nourished.  HENT:  Head: Normocephalic and atraumatic.  Eyes: Conjunctivae are normal. Pupils are equal, round, and reactive to light.  Neck: Normal range of motion.  Cardiovascular: Normal heart sounds.   Respiratory: Effort normal.  GI: Soft.  Musculoskeletal: Normal range of motion.  Neurological: He is alert.  Skin: Skin is warm and dry.  Psychiatric: His affect is angry. His speech is delayed. He is withdrawn. Thought content is paranoid. Cognition and memory are impaired. He exhibits a depressed mood.    Review of Systems  Constitutional: Negative.   HENT: Negative.   Eyes: Negative.   Respiratory: Negative.   Cardiovascular: Negative.   Gastrointestinal: Negative.   Musculoskeletal: Negative.   Skin: Negative.   Neurological: Negative.   Psychiatric/Behavioral: Positive for depression and substance abuse. Negative for suicidal ideas and hallucinations. The patient is nervous/anxious and has insomnia.     Blood pressure 113/77, pulse 71, temperature 98.2 F (36.8 C),  temperature source Oral, resp. rate 18, height 5\' 6"  (1.676 m), weight 56.7 kg (125 lb), SpO2 99 %.Body mass index is 20.19 kg/(m^2).  General Appearance: Disheveled  Eye Contact::  Minimal  Speech:  Slow  Volume:  Decreased  Mood:  Dysphoric  Affect:  Labile  Thought Process:  Goal Directed  Orientation:  Full (Time, Place, and Person)  Thought Content:  Negative  Suicidal Thoughts:   Yes.  without intent/plan  Homicidal Thoughts:  No  Memory:  Immediate;   Good Recent;   Fair Remote;   Good  Judgement:  Impaired  Insight:  Lacking  Psychomotor Activity:  Decreased  Concentration:  Fair  Recall:  [employer] of Knowledge:Fair  Language: Fair  Akathisia:  No  Handed:  Right  AIMS (if indicated):     Assets:  Communication Skills Physical Health  ADL's:  Intact  Cognition: WNL  Sleep:  Number of Hours: 7     Current Medications: Current Facility-Administered Medications  Medication Dose Route Frequency Provider Last Rate Last Dose  . acetaminophen (TYLENOL) tablet 650 mg  650 mg Oral Q6H PRN Audery Amel, MD      . alum & mag hydroxide-simeth (MAALOX/MYLANTA) 200-200-20 MG/5ML suspension 30 mL  30 mL Oral Q4H PRN Audery Amel, MD      . chlordiazePOXIDE (LIBRIUM) capsule 25 mg  25 mg Oral QID Shari Prows, MD   25 mg at 08/14/15 1306  . magnesium hydroxide (MILK OF MAGNESIA) suspension 30 mL  30 mL Oral Daily PRN Audery Amel, MD      . nicotine (NICODERM CQ - dosed in mg/24 hours) patch 14 mg  14 mg Transdermal Daily Jolanta B Pucilowska, MD   14 mg at 08/14/15 0941  . nicotine (NICOTROL) 10 MG inhaler 1 continuous puffing  1 continuous puffing Inhalation PRN Jolanta B Pucilowska, MD      . QUEtiapine (SEROQUEL) tablet 100 mg  100 mg Oral TID Shari Prows, MD   100 mg at 08/14/15 0853  . QUEtiapine (SEROQUEL) tablet 400 mg  400 mg Oral QHS Shari Prows, MD   400 mg at 08/13/15 2131  . sertraline (ZOLOFT) tablet 100 mg  100 mg Oral Daily Audery Amel, MD        Lab Results:  Results for orders placed or performed during the hospital encounter of 08/12/15 (from the past 48 hour(s))  Hemoglobin A1c     Status: None   Collection Time: 08/12/15  5:19 PM  Result Value Ref Range   Hgb A1c MFr Bld 5.0 4.0 - 6.0 %  Lipid panel, fasting     Status: Abnormal   Collection Time: 08/12/15  5:19 PM  Result Value Ref Range   Cholesterol  181 0 - 200 mg/dL   Triglycerides 82 <956 mg/dL   HDL 65 >21 mg/dL   Total CHOL/HDL Ratio 2.8 RATIO   VLDL 16 0 - 40 mg/dL   LDL Cholesterol 308 (H) 0 - 99 mg/dL    Comment:        Total Cholesterol/HDL:CHD Risk Coronary Heart Disease Risk Table                     Men   Women  1/2 Average Risk   3.4   3.3  Average Risk       5.0   4.4  2 X Average Risk   9.6   7.1  3 X Average Risk  23.4   11.0        Use the calculated Patient Ratio above and the CHD Risk Table to determine the patient's CHD Risk.        ATP III CLASSIFICATION (LDL):  <100     mg/dL   Optimal  161-096  mg/dL   Near or Above                    Optimal  130-159  mg/dL   Borderline  045-409  mg/dL   High  >811     mg/dL   Very High   TSH     Status: None   Collection Time: 08/12/15  5:19 PM  Result Value Ref Range   TSH 0.767 0.350 - 4.500 uIU/mL    Physical Findings: AIMS: Facial and Oral Movements Muscles of Facial Expression: None, normal Lips and Perioral Area: None, normal Jaw: None, normal Tongue: None, normal,Extremity Movements Upper (arms, wrists, hands, fingers): None, normal Lower (legs, knees, ankles, toes): None, normal, Trunk Movements Neck, shoulders, hips: None, normal, Overall Severity Severity of abnormal movements (highest score from questions above): None, normal Incapacitation due to abnormal movements: None, normal Patient's awareness of abnormal movements (rate only patient's report): Aware, no distress, Dental Status Current problems with teeth and/or dentures?: No Does patient usually wear dentures?: No  CIWA:  CIWA-Ar Total: 1 COWS:  COWS Total Score: 1  Treatment Plan Summary: Daily contact with patient to assess and evaluate symptoms and progress in treatment, Medication management and Plan Continue involvement in groups and daily monitoring. Patient wanted to discuss medication changes. He says the Depakote has not been tolerable for him in the past and wants to  discontinue it. He wanted me to prescribe him clonazepam. I explained the inappropriateness of this given his heavy substance abuse problem. Patient's insight is poor and he just became irritated. I we will discontinue the Depakote however at his request and restart Zoloft 100 mg a day which she says was more helpful for his mood and nerves in the past. Psychoeducation and supportive counseling completed.   Medical Decision Making:  Review of Psycho-Social Stressors (1), Established Problem, Worsening (2), Review of Medication Regimen & Side Effects (2) and Review of New Medication or Change in Dosage (2)     Riniyah Speich 08/14/2015, 2:40 PM
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
## 2015-08-14 NOTE — [facility] Group Notes (Signed)
[facility] LCSW Group Therapy  08/14/2015 4:04 PM  Type of Therapy:  Group Therapy  Participation Level:  Active  Participation Quality:  Attentive  Affect:  Flat  Cognitive:  Alert  Insight:  Improving  Engagement in Therapy:  Improving  Modes of Intervention:  Discussion, Education, Socialization and Support  Summary of Progress/Problems: Pt will identify unhealthy thoughts and how they impact their emotions and behavior. Pt will be encouraged to discuss these thoughts, emotions and behaviors with the group. Kamden states he is having withdraw symptoms including anxiety. He discussed unhealthy thoughts such as SI, HI and his self esteem. When he is having these thoughts, he tends to use substances.   [employer] MSW, LCSWA  08/14/2015, 4:04 PM

## 2015-08-14 NOTE — Progress Notes (Signed)
Patient alert and oriented x 4 today. Affect sad and flat.  Mood has been irritable.  Pt remained in bed most of the day.  Refused to go the NA meeting.  Became upset when Dr. Toni Amend did not order him Clonapin. Became selective with his medication. Will cont to monitor for safety.

## 2015-08-14 NOTE — Plan of Care (Signed)
Problem: Ineffective individual coping Goal: LTG: Patient will report a decrease in negative feelings Outcome: Progressing Pt states decrease in negative feelings.  Goal: STG: Pt will be able to identify effective and ineffective STG: Pt will be able to identify effective and ineffective coping patterns  Outcome: Not Progressing Not progressing. Patient focused on medication.  Goal: STG: Patient will remain free from self harm Outcome: Progressing No self harm.

## 2015-08-15 NOTE — Progress Notes (Signed)
Patient is pleasant and cooperative, notes no needs or distress, and is active in the milieu. He is medication compliant. He denies SI, HI, and AVH. He notes lessening withdrawal symptoms and relief with current medications.  

## 2015-08-15 NOTE — Plan of Care (Signed)
Problem: Ineffective individual coping Goal: LTG: Patient will report a decrease in negative feelings Outcome: Progressing States he feels better.  Goal: STG-Increase in ability to manage activities of daily living Outcome: Progressing Independently completes ADL's.  Problem: Consults Goal: Suicide Risk Patient Education (See Patient Education module for education specifics)  Outcome: Progressing Denies current SI.   Problem: Diagnosis: Increased Risk For Suicide Attempt Goal: LTG-Patient Will Show Positive Response to Medication LTG (by discharge) : Patient will show positive response to medication and will participate in the development of the discharge plan.  Outcome: Progressing Desired response is achieved with his medications.
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
## 2015-08-15 NOTE — Progress Notes (Signed)
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  Update as of Sunday. Much more calm and relaxed. Not shaky. Not asking for more benzodiazepine's. Denies suicidal ideation. Seems to be more positive about a plan to get back to work and get into some longer-term sobriety. No sign of acute mania. Principal Problem: Bipolar disorder, current episode depressed, severe, without psychotic features Diagnosis:   Patient Active Problem List   Diagnosis Date Noted  . Bipolar disorder, current episode depressed, severe, without psychotic features [F31.4] 08/11/2015  . Cocaine use disorder, severe, dependence [F14.20] 08/11/2015  . Alcohol use disorder, severe, dependence [F10.20] 08/11/2015  . Cannabis use disorder, severe, dependence [F12.20] 08/11/2015   Total Time spent with patient: 30 minutes   Past Medical History:  Past Medical History  Diagnosis Date  . GI bleeding   . GERD (gastroesophageal reflux disease)   . Anxiety   . Depression   . PTSD (post-traumatic stress disorder)   . Bipolar 1 disorder   . Allergy   . GI (gastrointestinal bleed)    History reviewed. No pertinent past surgical history. Family History:  Family History  Problem Relation Age of Onset  . Stroke Mother    Social History:  History  Alcohol Use  . Yes    Comment: 4 beer cans (24 oz) every day      History  Drug Use  . Yes  . Special: Marijuana    Social History   Social History  . Marital Status: Single    Spouse Name: N/A  . Number of Children: N/A  . Years of Education: N/A   Social History Main Topics  . Smoking status: Current Every Day Smoker -- 0.75 packs/day    Types: Cigarettes  . Smokeless tobacco: None  . Alcohol Use: Yes     Comment: 4 beer cans (24  oz) every day   . Drug Use: Yes    Special: Marijuana  . Sexual Activity: Not Asked   Other Topics Concern  . None   Social History Narrative   Additional History:    Sleep: Poor  Appetite:  Fair   Assessment: Patient continues to be very withdrawn. Affect dysphoric and irritable. He denies suicidal ideation. Not really very cooperative. He is very focused on trying to be prescribed clonazepam. Insight is questionable. No sign of hyperactivity or hyper verbality. Denies acute suicidal thoughts. Only partial improvement.  Musculoskeletal: Strength & Muscle Tone: within normal limits Gait & Station: normal Patient leans: N/A   Psychiatric Specialty Exam: Physical Exam  Nursing note and vitals reviewed. Constitutional: He appears well-developed and well-nourished.  HENT:  Head: Normocephalic and atraumatic.  Eyes: Conjunctivae are normal. Pupils are equal, round, and reactive to light.  Neck: Normal range of motion.  Cardiovascular: Normal heart sounds.   Respiratory: Effort normal.  GI: Soft.  Musculoskeletal: Normal range of motion.  Neurological: He is alert.  Skin: Skin is warm and dry.  Psychiatric: His behavior is normal. Thought content normal. His affect is not angry. His speech is delayed. He is not withdrawn. Thought content is not paranoid. Cognition and memory are impaired. He does not exhibit a depressed mood.    Review of Systems  Constitutional: Negative.   HENT: Negative.   Eyes: Negative.   Respiratory: Negative.  Cardiovascular: Negative.   Gastrointestinal: Negative.   Musculoskeletal: Negative.   Skin: Negative.   Neurological: Negative.   Psychiatric/Behavioral: Positive for depression and substance abuse. Negative for suicidal ideas and hallucinations. The patient is nervous/anxious and has insomnia.     Blood pressure 110/73, pulse 77, temperature 98.4 F (36.9 C), temperature source Oral, resp. rate 20, height 5\' 6"  (1.676 m), weight 56.7 kg  (125 lb), SpO2 99 %.Body mass index is 20.19 kg/(m^2).  General Appearance: Disheveled  Eye Contact::  Minimal  Speech:  Slow  Volume:  Decreased  Mood:  Dysphoric  Affect:  Labile  Thought Process:  Goal Directed  Orientation:  Full (Time, Place, and Person)  Thought Content:  Negative  Suicidal Thoughts:  Yes.  without intent/plan  Homicidal Thoughts:  No  Memory:  Immediate;   Good Recent;   Fair Remote;   Good  Judgement:  Impaired  Insight:  Lacking  Psychomotor Activity:  Decreased  Concentration:  Fair  Recall:  [employer] of Knowledge:Fair  Language: Fair  Akathisia:  No  Handed:  Right  AIMS (if indicated):     Assets:  Communication Skills Physical Health  ADL's:  Intact  Cognition: WNL  Sleep:  Number of Hours: 7     Current Medications: Current Facility-Administered Medications  Medication Dose Route Frequency Provider Last Rate Last Dose  . acetaminophen (TYLENOL) tablet 650 mg  650 mg Oral Q6H PRN Audery Amel, MD      . alum & mag hydroxide-simeth (MAALOX/MYLANTA) 200-200-20 MG/5ML suspension 30 mL  30 mL Oral Q4H PRN Audery Amel, MD      . magnesium hydroxide (MILK OF MAGNESIA) suspension 30 mL  30 mL Oral Daily PRN Audery Amel, MD      . nicotine (NICODERM CQ - dosed in mg/24 hours) patch 14 mg  14 mg Transdermal Daily Jolanta B Pucilowska, MD   14 mg at 08/15/15 0957  . nicotine (NICOTROL) 10 MG inhaler 1 continuous puffing  1 continuous puffing Inhalation PRN Jolanta B Pucilowska, MD      . QUEtiapine (SEROQUEL) tablet 100 mg  100 mg Oral TID Shari Prows, MD   100 mg at 08/15/15 0832  . QUEtiapine (SEROQUEL) tablet 400 mg  400 mg Oral QHS Shari Prows, MD   400 mg at 08/14/15 2129  . sertraline (ZOLOFT) tablet 100 mg  100 mg Oral Daily Audery Amel, MD   100 mg at 08/15/15 0831    Lab Results:  No results found for this or any previous visit (from the past 48 hour(s)).  Physical Findings: AIMS: Facial and Oral  Movements Muscles of Facial Expression: None, normal Lips and Perioral Area: None, normal Jaw: None, normal Tongue: None, normal,Extremity Movements Upper (arms, wrists, hands, fingers): None, normal Lower (legs, knees, ankles, toes): None, normal, Trunk Movements Neck, shoulders, hips: None, normal, Overall Severity Severity of abnormal movements (highest score from questions above): None, normal Incapacitation due to abnormal movements: None, normal Patient's awareness of abnormal movements (rate only patient's report): Aware, no distress, Dental Status Current problems with teeth and/or dentures?: No Does patient usually wear dentures?: No  CIWA:  CIWA-Ar Total: 0 COWS:  COWS Total Score: 1  Treatment Plan Summary: Daily contact with patient to assess and evaluate symptoms and progress in treatment, Medication management and Plan Continue involvement in groups and daily monitoring. Patient wanted to discuss medication changes. He says the Depakote has not been tolerable for him in  the past and wants to discontinue it. He wanted me to prescribe him clonazepam. I explained the inappropriateness of this given his heavy substance abuse problem. Patient's insight is poor and he just became irritated. I we will discontinue the Depakote however at his request and restart Zoloft 100 mg a day which she says was more helpful for his mood and nerves in the past. Psychoeducation and supportive counseling completed.  No change to treatment plan today. Psychoeducation and supportive counseling done. Medical Decision Making:  Review of Psycho-Social Stressors (1), Established Problem, Worsening (2), Review of Medication Regimen & Side Effects (2) and Review of New Medication or Change in Dosage (2)     John Clapacs 08/15/2015, 2:59 PM

## 2015-08-15 NOTE — [facility] Group Notes (Signed)
[facility] Group Notes:  (Nursing/MHT/Case Management/Adjunct)  Date:  08/15/2015  Time:  12:38 AM  Type of Therapy:  Group Therapy/Outside Activity  Participation Level:  Active  Participation Quality:  Appropriate  Affect:  Appropriate  Cognitive:  Alert  Insight:  Good  Engagement in Group:  Engaged  Modes of Intervention:  Activity  Summary of Progress/Problems:  Carlos French 08/15/2015, 12:38 AM

## 2015-08-15 NOTE — Progress Notes (Signed)
Patient is pleasant and cooperative, notes no needs or distress, and is active in the milieu. He is medication compliant. He notes he will attend meetings when he leaves the hospital in an effort to maintain his sobriety. He denies SI, HI, and AVH. He notes lessening withdrawal symptoms and relief with current medications.

## 2015-08-16 MED ORDER — QUETIAPINE FUMARATE 100 MG PO TABS: 100.0000 mg | ORAL_TABLET | Freq: Three times a day (TID) | ORAL | Status: DC

## 2015-08-16 MED ORDER — QUETIAPINE FUMARATE 400 MG PO TABS: 400.0000 mg | ORAL_TABLET | Freq: Every day | ORAL | Status: DC

## 2015-08-16 MED ORDER — SERTRALINE HCL 100 MG PO TABS: 100.0000 mg | ORAL_TABLET | Freq: Every day | ORAL | Status: DC

## 2015-08-16 NOTE — Progress Notes (Signed)
In dayroom interacting with peers. Is pleasant to interact with. Was medication compliant. Denies SI, HI, AVH. Had an uneventful night.

## 2015-08-16 NOTE — Progress Notes (Signed)
Recreation Therapy Notes  INPATIENT RECREATION TR PLAN  Patient Details Name: Carlos French MRN: [id number] DOB: [birth year] Today's Date: 08/16/2015  Rec Therapy Plan Is patient appropriate for Therapeutic Recreation?: Yes Treatment times per week: At least once a week TR Treatment/Interventions: 1:1 session, Group participation (Comment) (Appropriate participation in daily recreation therapy tx)  Discharge Criteria Pt will be discharged from therapy if:: Discharged Treatment plan/goals/alternatives discussed and agreed upon by:: Patient/family  Discharge Summary Short term goals set: See Care Plan Short term goals met: Complete Progress toward goals comments: One-to-one attended Which groups?: Communication, Other (Comment) (Problem solving, teamwork) One-to-one attended: Stress management, anger management, coping skills Reason goals not met: N/A Therapeutic equipment acquired: None Reason patient discharged from therapy: Discharge from hospital Pt/family agrees with progress & goals achieved: Yes Date patient discharged from therapy: 08/16/15   Leonette Monarch, LRT/CTRS 08/16/2015, 2:02 PM

## 2015-08-16 NOTE — Discharge Summary (Signed)
Physician Discharge Summary Note  Patient:  Carlos French is an 24 y.o., male MRN:  [id number] DOB:  03-Sep-1991 Patient phone:  [phone] (home)  Patient address:   [address] [place] [address],  Total Time spent with patient: 30 minutes  Date of Admission:  08/12/2015 Date of Discharge: 08/16/2015  Reason for Admission:  Suicidal ideation.  Identifying data. Carlos French is a 24 year old male with a history of bipolar illness and alcoholism.  Chief complaint. "I need help."  History of present illness. Carlos French has a long history of depression, mood instability, and use. He participated in substance abuse treatment program in [place] and was able to maintain sobriety for 6 months while enrolled. He left the program 3 weeks ago. He relapsed on alcohol immediately. He became increasingly depressed with poor sleep and decreased appetite, anhedonia, feeling of guilt and hopelessness worthlessness, poor energy and concentration, social isolation, crying spells, heightened anxiety. He is drinking escalated. He developed suicidal ideation with a plan to use substances. He decided to come to the hospital for help. He denies psychotic symptoms. He denies symptoms suggestive of bipolar mania. In addition to alcohol is been using cocaine and cannabinoids.  Past psychiatric history. There were several admissions at least 2 to [facility] for mood instability and suicidal ideation and alcoholism. There is a history of self-injurious behavior. He has not been taking any medications lately. In the past he did well on a combination of Depakote and Seroquel.  Principal Problem: Bipolar disorder, current episode depressed, severe, without psychotic features Discharge Diagnoses: Patient Active Problem List   Diagnosis Date Noted  . Bipolar disorder, current episode depressed, severe, without psychotic features [F31.4] 08/11/2015  . Cocaine use disorder, severe, dependence  [F14.20] 08/11/2015  . Alcohol use disorder, severe, dependence [F10.20] 08/11/2015  . Cannabis use disorder, severe, dependence [F12.20] 08/11/2015    Musculoskeletal: Strength & Muscle Tone: within normal limits Gait & Station: normal Patient leans: N/A  Psychiatric Specialty Exam: Physical Exam  Nursing note and vitals reviewed.   Review of Systems  All other systems reviewed and are negative.   Blood pressure 121/73, pulse 79, temperature 98.2 F (36.8 C), temperature source Oral, resp. rate 20, height 5\' 6"  (1.676 m), weight 56.7 kg (125 lb), SpO2 99 %.Body mass index is 20.19 kg/(m^2).  See SRA.                                                  Sleep:  Number of Hours: 6.5   Have you used any form of tobacco in the last 30 days? (Cigarettes, Smokeless Tobacco, Cigars, and/or Pipes): Yes  Has this patient used any form of tobacco in the last 30 days? (Cigarettes, Smokeless Tobacco, Cigars, and/or Pipes) Yes, A prescription for an FDA-approved tobacco cessation medication was offered at discharge and the patient refused  Past Medical History:  Past Medical History  Diagnosis Date  . GI bleeding   . GERD (gastroesophageal reflux disease)   . Anxiety   . Depression   . PTSD (post-traumatic stress disorder)   . Bipolar 1 disorder   . Allergy   . GI (gastrointestinal bleed)    History reviewed. No pertinent past surgical history. Family History:  Family History  Problem Relation Age of Onset  . Stroke Mother    Social History:  History  Alcohol Use  . Yes    Comment: 4 beer cans (24 oz) every day      History  Drug Use  . Yes  . Special: Marijuana    Social History   Social History  . Marital Status: Single    Spouse Name: N/A  . Number of Children: N/A  . Years of Education: N/A   Social History Main Topics  . Smoking status: Current Every Day Smoker -- 0.75 packs/day    Types: Cigarettes  . Smokeless tobacco: None  . Alcohol  Use: Yes     Comment: 4 beer cans (24 oz) every day   . Drug Use: Yes    Special: Marijuana  . Sexual Activity: Not Asked   Other Topics Concern  . None   Social History Narrative    Past Psychiatric History: Hospitalizations:  Outpatient Care:  Substance Abuse Care:  Self-Mutilation:  Suicidal Attempts:  Violent Behaviors:   Risk to Self: Is patient at risk for suicide?: No (not at this time ) What has been your use of drugs/alcohol within the last 12 months?: Pt reports drinking 2-3 40oz beers and smoking marijuana everyday. Occasional cociane use.  Risk to Others:   Prior Inpatient Therapy:   Prior Outpatient Therapy:    Level of Care:  OP  Hospital Course:    Carlos French is a 24 year old male with a history of bipolar disorder and alcoholism admitted for suicidal ideation in the context of treatment noncompliance and relapse on alcohol.  1 Suicidal ideation. this has resolved. The patient is able to contract for safety.  2. Mood. He was restarted on Depakote and Seroquel for mood stabilization. Seroquel  was increased to 700 mg/day. He refuses to take Depakote but agreed to start Zoloft.   3. Alcohol detox. He completed Librium taper. This was uncomplicated detox. Vital signs were stable   4. Smoking. Nicotine products were available.  5. Substance abuse treatment. The patient was just discharged from six-month program in [place]. He relapsed on alcohol immediately. He is not interested in [facility]. He is not interested in [facility] rehabilitation participation. He wants to do outpatient treatment. He spoke to Carlos French about [facility] IOP program.   6. Disposition. He is discharged to home. He will follow up with [facility].   Consults:  None  Significant Diagnostic Studies:  None  Discharge Vitals:   Blood pressure 121/73, pulse 79, temperature 98.2 F (36.8 C), temperature source Oral, resp. rate 20, height  (1.676 m), weight 56.7 kg (125 lb), SpO2 99  %. Body mass index is 20.19 kg/(m^2). Lab Results:   No results found for this or any previous visit (from the past 72 hour(s)).  Physical Findings: AIMS: Facial and Oral Movements Muscles of Facial Expression: None, normal Lips and Perioral Area: None, normal Jaw: None, normal Tongue: None, normal,Extremity Movements Upper (arms, wrists, hands, fingers): None, normal Lower (legs, knees, ankles, toes): None, normal, Trunk Movements Neck, shoulders, hips: None, normal, Overall Severity Severity of abnormal movements (highest score from questions above): None, normal Incapacitation due to abnormal movements: None, normal Patient's awareness of abnormal movements (rate only patient's report): Aware, no distress, Dental Status Current problems with teeth and/or dentures?: No Does patient usually wear dentures?: No  CIWA:  CIWA-Ar Total: 0 COWS:  COWS Total Score: 1   See Psychiatric Specialty Exam and Suicide Risk Assessment completed by Attending Physician prior to discharge.  Discharge destination:  Home  Is patient on  multiple antipsychotic therapies at discharge:  No   Has Patient had three or more failed trials of antipsychotic monotherapy by history:  No    Recommended Plan for Multiple Antipsychotic Therapies: NA  Discharge Instructions    Diet - low sodium heart healthy    Complete by:  As directed      Increase activity slowly    Complete by:  As directed             Medication List    TAKE these medications      Indication   QUEtiapine 100 MG tablet  Commonly known as:  SEROQUEL  Take 1 tablet (100 mg total) by mouth 3 (three) times daily.   Indication:  Depressive Phase of Manic-Depression     QUEtiapine 400 MG tablet  Commonly known as:  SEROQUEL  Take 1 tablet (400 mg total) by mouth at bedtime.   Indication:  Depressive Phase of Manic-Depression     sertraline 100 MG tablet  Commonly known as:  ZOLOFT  Take 1 tablet (100 mg total) by mouth daily.    Indication:  Major Depressive Disorder         Follow-up recommendations:  Activity:  As tolerated. Diet:  Regular. Other:  Keep follow-up appointments.  Comments:    Total Discharge Time: 35 min.  Signed: Kristine Linea 08/16/2015, 9:49 AMcommonly

## 2015-08-16 NOTE — [facility] Suicide Risk Assessment (Signed)
[facility] Discharge Suicide Risk Assessment   Demographic Factors:  Male, Adolescent or young adult, Caucasian and Low socioeconomic status  Total Time spent with patient: 30 minutes  Musculoskeletal: Strength & Muscle Tone: within normal limits Gait & Station: normal Patient leans: N/A  Psychiatric Specialty Exam: Physical Exam  Nursing note and vitals reviewed.   Review of Systems  All other systems reviewed and are negative.   Blood pressure 121/73, pulse 79, temperature 98.2 F (36.8 C), temperature source Oral, resp. rate 20, height  (1.676 m), weight 56.7 kg (125 lb), SpO2 99 %.Body mass index is 20.19 kg/(m^2).  General Appearance: Casual  Eye Contact::  Good  Speech:  Clear and Coherent409  Volume:  Normal  Mood:  Euthymic  Affect:  Appropriate  Thought Process:  Goal Directed  Orientation:  Full (Time, Place, and Person)  Thought Content:  WDL  Suicidal Thoughts:  No  Homicidal Thoughts:  No  Memory:  Immediate;   Fair Recent;   Fair Remote;   Fair  Judgement:  Fair  Insight:  Fair  Psychomotor Activity:  Normal  Concentration:  Fair  Recall:  [employer] of Knowledge:Fair  Language: Fair  Akathisia:  No  Handed:  Right  AIMS (if indicated):     Assets:  Communication Skills Desire for Improvement Physical Health Transportation  Sleep:  Number of Hours: 6.5  Cognition: WNL  ADL's:  Intact   Have you used any form of tobacco in the last 30 days? (Cigarettes, Smokeless Tobacco, Cigars, and/or Pipes): Yes  Has this patient used any form of tobacco in the last 30 days? (Cigarettes, Smokeless Tobacco, Cigars, and/or Pipes) Yes, A prescription for an FDA-approved tobacco cessation medication was offered at discharge and the patient refused  Mental Status Per Nursing Assessment::   On Admission:  Suicidal ideation indicated by patient (prior to admission )  Current Mental Status by Physician: NA  Loss Factors: Financial problems/change in socioeconomic  status  Historical Factors: Impulsivity  Risk Reduction Factors:   Responsible for children under 64 years of age, Sense of responsibility to family, Employed and Positive social support  Continued Clinical Symptoms:  Bipolar Disorder:   Depressive phase Alcohol/Substance Abuse/Dependencies  Cognitive Features That Contribute To Risk:  None    Suicide Risk:  Minimal: No identifiable suicidal ideation.  Patients presenting with no risk factors but with morbid ruminations; may be classified as minimal risk based on the severity of the depressive symptoms  Principal Problem: Bipolar disorder, current episode depressed, severe, without psychotic features Discharge Diagnoses:  Patient Active Problem List   Diagnosis Date Noted  . Bipolar disorder, current episode depressed, severe, without psychotic features [F31.4] 08/11/2015  . Cocaine use disorder, severe, dependence [F14.20] 08/11/2015  . Alcohol use disorder, severe, dependence [F10.20] 08/11/2015  . Cannabis use disorder, severe, dependence [F12.20] 08/11/2015      Plan Of Care/Follow-up recommendations:  Activity:  As tolerated. Diet:  Regular. Other:  Keep follow-up appointments.  Is patient on multiple antipsychotic therapies at discharge:  No   Has Patient had three or more failed trials of antipsychotic monotherapy by history:  No  Recommended Plan for Multiple Antipsychotic Therapies: NA    Estefania Kamiya 08/16/2015, 9:45 AM

## 2015-08-16 NOTE — Plan of Care (Signed)
Problem: [facility] Participation in Recreation Therapeutic Interventions Goal: STG-Patient will identify at least five coping skills for ** STG: Coping Skills - Within treatment sessions, patient will verbalize at least 5 coping skills for anger in one treatment session to increase anger management skills post d/c.  Outcome: Completed/Met Date Met:  08/16/15 Treatment Session 1; Completed 1 out of 1: At approximately 12:00 pm, LRT met with patient in consultation room. Patient verbalized 5 coping skills for anger. Patient verbalized what triggers him to get angry, how his body responds to anger, and how he will remind himself to use his healthy coping skills. LRT provided suggestions as well.  Leonette Monarch, LRT/CTRS 08.22.16 1:56 pm Goal: STG-Other Recreation Therapy Goal (Specify) STG: Stress Management - Within 3 treatment sessions, patient will verbalize understanding of the stress management techniques in one treatment session to increase stress management skills post d/c.  Outcome: Completed/Met Date Met:  08/16/15 Treatment Session 1; Completed 1 out of 1: At approximately 11:55 am, LRT met with patient in community room. LRT educated and provided patient with handouts on stress management techniques. Patient verbalized understanding. LRT encouraged patient to read over and practice the stress management techniques.  Leonette Monarch, LRT/CTRS 08.22.16 1:58 pm  Problem: [facility] Participation in Recreation Therapeutic Interventions Goal: STG-Patient will identify at least five coping skills for ** STG: Coping Skills - Within 3 treatment sessions, patient will verbalize at least 5 coping skills for substance abuse in one treatment session to decrease substance abuse post d/c.  Outcome: Completed/Met Date Met:  08/16/15 Treatment Session 1; Completed 1 out of 1: At approximately 12:00 pm, LRT met with patient in consultation room. Patient verbalized 5 coping skills for substance abuse. LRT  educated patient on leisure and why it is important to implement it into his schedule. LRT provided patient with blank schedules to help him plan his day and try to avoid using substances. LRT educated patient on healthy support systems.  Leonette Monarch, LRT/CTRS 08.22.16 2:00 pm

## 2015-08-16 NOTE — Progress Notes (Signed)
DISCHARGE NOTE.  Patient is alert and oriented x4. Denies Pain.  He was given all his belongings back along with follow up information.  Patient verbalized understanding.  He was also given a crisis card and informed to call if he needed any help along with a meeting list for AA in the area.  Patient denied SI or HI.  He also denies AH or VH.  He was escorted off the unit by staff. No distress noted.

## 2015-08-17 NOTE — Progress Notes (Signed)
[facility]S H&P Discharge Summary faxed to [facility] for hospital follow-up °

## 2015-08-20 ENCOUNTER — Encounter: Payer: Self-pay | Admitting: Emergency Medicine

## 2015-08-20 ENCOUNTER — Emergency Department
Admission: EM | Admit: 2015-08-20 | Discharge: 2015-08-22 | Disposition: A | Discharge status: Other Acute Inpatient Hospital | Payer: Self-pay | Attending: Emergency Medicine | Admitting: Emergency Medicine

## 2015-08-20 DIAGNOSIS — F1910 Other psychoactive substance abuse, uncomplicated: Secondary | ICD-10-CM

## 2015-08-20 DIAGNOSIS — F1310 Sedative, hypnotic or anxiolytic abuse, uncomplicated: Secondary | ICD-10-CM | POA: Insufficient documentation

## 2015-08-20 DIAGNOSIS — R45851 Suicidal ideations: Secondary | ICD-10-CM

## 2015-08-20 DIAGNOSIS — F1410 Cocaine abuse, uncomplicated: Secondary | ICD-10-CM | POA: Insufficient documentation

## 2015-08-20 DIAGNOSIS — F32A Depression, unspecified: Secondary | ICD-10-CM

## 2015-08-20 DIAGNOSIS — F1010 Alcohol abuse, uncomplicated: Secondary | ICD-10-CM | POA: Insufficient documentation

## 2015-08-20 DIAGNOSIS — Z720 Tobacco use: Secondary | ICD-10-CM | POA: Insufficient documentation

## 2015-08-20 DIAGNOSIS — F1210 Cannabis abuse, uncomplicated: Secondary | ICD-10-CM | POA: Insufficient documentation

## 2015-08-20 DIAGNOSIS — F329 Major depressive disorder, single episode, unspecified: Secondary | ICD-10-CM | POA: Insufficient documentation

## 2015-08-20 DIAGNOSIS — F314 Bipolar disorder, current episode depressed, severe, without psychotic features: Secondary | ICD-10-CM

## 2015-08-20 DIAGNOSIS — F1020 Alcohol dependence, uncomplicated: Secondary | ICD-10-CM | POA: Diagnosis present

## 2015-08-20 DIAGNOSIS — F1420 Cocaine dependence, uncomplicated: Secondary | ICD-10-CM | POA: Diagnosis present

## 2015-08-20 DIAGNOSIS — F1220 Cannabis dependence, uncomplicated: Secondary | ICD-10-CM | POA: Diagnosis present

## 2015-08-20 LAB — COMPREHENSIVE METABOLIC PANEL
ALT: 45 U/L (ref 17–63)
ANION GAP: 8 (ref 5–15)
AST: 37 U/L (ref 15–41)
Albumin: 4.6 g/dL (ref 3.5–5.0)
Alkaline Phosphatase: 79 U/L (ref 38–126)
BUN: 12 mg/dL (ref 6–20)
CALCIUM: 9.1 mg/dL (ref 8.9–10.3)
CHLORIDE: 104 mmol/L (ref 101–111)
CO2: 28 mmol/L (ref 22–32)
Creatinine, Ser: 1 mg/dL (ref 0.61–1.24)
GFR calc non Af Amer: >60 mL/min (ref >60)
Glucose, Bld: 104 mg/dL — ABNORMAL HIGH (ref 65–99)
Potassium: 4.1 mmol/L (ref 3.5–5.1)
SODIUM: 140 mmol/L (ref 135–145)
Total Bilirubin: 0.5 mg/dL (ref 0.3–1.2)
Total Protein: 7.8 g/dL (ref 6.5–8.1)

## 2015-08-20 LAB — CBC
HEMATOCRIT: 47.3 % (ref 40.0–52.0)
Hemoglobin: 16 g/dL (ref 13.0–18.0)
MCH: 32.5 pg (ref 26.0–34.0)
MCHC: 33.8 g/dL (ref 32.0–36.0)
MCV: 95.9 fL (ref 80.0–100.0)
PLATELETS: 216 10*3/uL (ref 150–440)
RBC: 4.93 MIL/uL (ref 4.40–5.90)
RDW: 14.5 % (ref 11.5–14.5)
WBC: 7.7 10*3/uL (ref 3.8–10.6)

## 2015-08-20 LAB — URINE DRUG SCREEN, QUALITATIVE (ARMC ONLY)
AMPHETAMINES, UR SCREEN: NOT DETECTED
Barbiturates, Ur Screen: NOT DETECTED
Benzodiazepine, Ur Scrn: POSITIVE — AB
Cannabinoid 50 Ng, Ur ~~LOC~~: POSITIVE — AB
Cocaine Metabolite,Ur ~~LOC~~: POSITIVE — AB
MDMA (ECSTASY) UR SCREEN: NOT DETECTED
METHADONE SCREEN, URINE: NOT DETECTED
Opiate, Ur Screen: NOT DETECTED
PHENCYCLIDINE (PCP) UR S: NOT DETECTED
Tricyclic, Ur Screen: NOT DETECTED

## 2015-08-20 LAB — ETHANOL: Alcohol, Ethyl (B): 7 mg/dL — ABNORMAL HIGH (ref <5)

## 2015-08-20 MED ORDER — QUETIAPINE FUMARATE 25 MG PO TABS
100.0000 mg | ORAL_TABLET | Freq: Three times a day (TID) | ORAL | Status: DC
  Administered 2015-08-21 (×3): 100 mg via ORAL
  Filled 2015-08-20 (×2): qty 4

## 2015-08-20 MED ORDER — DIAZEPAM 5 MG PO TABS
10.0000 mg | ORAL_TABLET | Freq: Once | ORAL | Status: AC
  Administered 2015-08-20: 10 mg via ORAL
  Filled 2015-08-20: qty 2

## 2015-08-20 MED ORDER — SERTRALINE HCL 100 MG PO TABS
100.0000 mg | ORAL_TABLET | Freq: Every day | ORAL | Status: DC
Start: 2015-08-20 — End: 2015-08-22
  Administered 2015-08-20 – 2015-08-21 (×2): 100 mg via ORAL
  Filled 2015-08-20 (×3): qty 1

## 2015-08-20 MED ORDER — QUETIAPINE FUMARATE 200 MG PO TABS
400.0000 mg | ORAL_TABLET | Freq: Every day | ORAL | Status: DC
  Administered 2015-08-20 – 2015-08-21 (×2): 400 mg via ORAL
  Filled 2015-08-20 (×2): qty 2

## 2015-08-20 MED ORDER — LORAZEPAM 2 MG PO TABS: 0.0000 mg | ORAL_TABLET | Freq: Two times a day (BID) | ORAL | Status: DC

## 2015-08-20 MED ORDER — LORAZEPAM 2 MG PO TABS: 0.0000 mg | ORAL_TABLET | Freq: Four times a day (QID) | ORAL | Status: DC

## 2015-08-20 MED ORDER — VITAMIN B-1 100 MG PO TABS
100.0000 mg | ORAL_TABLET | Freq: Every day | ORAL | Status: DC
  Administered 2015-08-21: 100 mg via ORAL
  Filled 2015-08-20: qty 1

## 2015-08-20 NOTE — ED Notes (Addendum)
ENVIRONMENTAL ASSESSMENT  Potentially harmful objects out of patient reach: Yes.  Personal belongings secured: Yes.  Patient dressed in hospital provided attire only: Yes.  Plastic bags out of patient reach: Yes.  Patient care equipment (cords, cables, call bells, lines, and drains) shortened, removed, or accounted for: Yes.  Equipment and supplies removed from bottom of stretcher: Yes.  Potentially toxic materials out of patient reach: Yes.  Sharps container removed or out of patient reach: Yes.   BEHAVIORAL HEALTH ROUNDING  Patient sleeping: No.  Patient alert and oriented: yes  Behavior appropriate: Yes. ; If no, describe:  Nutrition and fluids offered: Yes  Toileting and hygiene offered: Yes  Sitter present: Q15 min rounding on pt for safety.   Law enforcement present: Yes ODS  ED BHU PLACEMENT JUSTIFICATION  Is the patient under IVC or is there intent for IVC: Yes.  Is the patient medically cleared: Yes.  Is there vacancy in the ED BHU: Yes.  Is the population mix appropriate for patient: Yes.  Is the patient awaiting placement in inpatient or outpatient setting: Yes.  Has the patient had a psychiatric consult: Yes.  Survey of unit performed for contraband, proper placement and condition of furniture, tampering with fixtures in bathroom, shower, and each patient room: Yes. ; Findings: All clear  APPEARANCE/BEHAVIOR  calm, cooperative and adequate rapport can be established  NEURO ASSESSMENT  Orientation: time, place and person  Hallucinations: No.None noted (Hallucinations)  Speech: Normal  Gait: normal  RESPIRATORY ASSESSMENT  WNL  CARDIOVASCULAR ASSESSMENT  WNL  GASTROINTESTINAL ASSESSMENT  WNL  EXTREMITIES  WNL  PLAN OF CARE  Provide calm/safe environment. Vital signs assessed twice daily. ED BHU Assessment once each 12-hour shift. Collaborate with intake RN daily or as condition indicates. Assure the ED provider has rounded once each shift. Provide and encourage  hygiene. Provide redirection as needed. Assess for escalating behavior; address immediately and inform ED provider.  Assess family dynamic and appropriateness for visitation as needed: Yes. ; If necessary, describe findings:  Educate the patient/family about BHU procedures/visitation: Yes. ; If necessary, describe findings: Pt is calm and cooperative at this time. Pt understanding and accepting of unit procedures/rules. Will continue to monitor.
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
## 2015-08-20 NOTE — Consult Note (Signed)
[facility] Psychiatry Consult   Reason for Consult:  Consult for 24 year old man with a history of bipolar disorder and substance abuse who comes back to the hospital with suicidal ideation and behavior Referring Physician:  Karma Greaser Patient Identification: Carlos French MRN:  [id number] Principal Diagnosis: Bipolar disorder, current episode depressed, severe, without psychotic features Diagnosis:   Patient Active Problem List   Diagnosis Date Noted  . Bipolar disorder, current episode depressed, severe, without psychotic features [F31.4] 08/11/2015  . Cocaine use disorder, severe, dependence [F14.20] 08/11/2015  . Alcohol use disorder, severe, dependence [F10.20] 08/11/2015  . Cannabis use disorder, severe, dependence [F12.20] 08/11/2015    Total Time spent with patient: 1 hour  Subjective:   Carlos French is a 24 y.o. male patient admitted with "I'm really mad at myself. This time I really want to change".  HPI:  24 year old man with a history of bipolar disorder and substance abuse. He was just discharged from the hospital on August 21. He says that he did not take any of his prescription medicines after leaving the hospital. Instead he relapsed immediately back into heavy abuse of cocaine. He has not been sleeping well. His mood is been increasingly labile angry depressed and anxious. Today he went to his father's place of employment and held a knife to his own throat. Father wrestled the knife away from him. Patient says that he is overwhelmed with negative thoughts. Feels like no one cares about him. Says he is having active wishes to die. No current hallucinations.  Past psychiatric history for bipolar disorder but also heavy substance abuse. Tends to be resistant to outpatient care. Does have a history of suicide attempts. Medications have been partially helpful at some times.  Substance abuse history: Heavy substance abuse since teenage years. Only intermittent periods of  sobriety. Hasn't really engaged in affective treatment for long.  Medical history: No significant medical problems outside of the mental health and substance abuse.  Family history: Positive for depression and some substance abuse.  Current medications: He was supposed to be taking quetiapine 100 mg 3 times a day and 400 mg at night and Zoloft 100 mg per day but has been noncompliant.  Social history: Patient has no place to live. He sleeps in his truck. He intermittently works either with his father or at a [employer] but now has lost all of his jobs. HPI Elements:   Quality:  Suicidal ideation mood lability depression. Severity:  Severe and life-threatening. Timing:  Chronic problem worse over the last 3-4 days. Duration:  Ongoing problem as noted above. Context:  Continued substance abuse.  Past Medical History:  Past Medical History  Diagnosis Date  . GI bleeding   . GERD (gastroesophageal reflux disease)   . Anxiety   . Depression   . PTSD (post-traumatic stress disorder)   . Bipolar 1 disorder   . Allergy   . GI (gastrointestinal bleed)    History reviewed. No pertinent past surgical history. Family History:  Family History  Problem Relation Age of Onset  . Stroke Mother    Social History:  History  Alcohol Use  . Yes    Comment: 4 beer cans (24 oz) every day      History  Drug Use  . Yes  . Special: Marijuana, Cocaine    Social History   Social History  . Marital Status: Single    Spouse Name: N/A  . Number of Children: N/A  . Years of Education:  N/A   Social History Main Topics  . Smoking status: Current Every Day Smoker -- 0.75 packs/day    Types: Cigarettes  . Smokeless tobacco: None  . Alcohol Use: Yes     Comment: 4 beer cans (24 oz) every day   . Drug Use: Yes    Special: Marijuana, Cocaine  . Sexual Activity: Not Asked   Other Topics Concern  . None   Social History Narrative   Additional Social History:    Pain Medications:  None noted Prescriptions: None Noted Over the Counter: None Noted History of alcohol / drug use?: Yes Longest period of sobriety (when/how long): 5 months, earlier 2016 when in inpatient hospitalization Negative Consequences of Use: Financial, Personal relationships, Work / Programmer, multimedia Name of Substance 1: ETOH 1 - Age of First Use: 14 1 - Amount (size/oz): "5 40's" 1 - Frequency: Daily 1 - Last Use / Amount: 08/19/15 Name of Substance 2: THC 2 - Age of First Use: 15 2 - Amount (size/oz): "1 joint" 2 - Frequency: Daily 2 - Duration: five years 2 - Last Use / Amount: 08/19/15 Name of Substance 3: Cocaine 3 - Age of First Use: 18 3 - Amount (size/oz): 1 gram 3 - Frequency: two times a week 3 - Duration: "a couple of years" 3 - Last Use / Amount: 08/18/15               Allergies:  No Known Allergies  Labs:  Results for orders placed or performed during the hospital encounter of 08/20/15 (from the past 48 hour(s))  Comprehensive metabolic panel     Status: Abnormal   Collection Time: 08/20/15  1:29 PM  Result Value Ref Range   Sodium 140 135 - 145 mmol/L   Potassium 4.1 3.5 - 5.1 mmol/L   Chloride 104 101 - 111 mmol/L   CO2 28 22 - 32 mmol/L   Glucose, Bld 104 (H) 65 - 99 mg/dL   BUN 12 6 - 20 mg/dL   Creatinine, Ser 1.94 0.61 - 1.24 mg/dL   Calcium 9.1 8.9 - 78.6 mg/dL   Total Protein 7.8 6.5 - 8.1 g/dL   Albumin 4.6 3.5 - 5.0 g/dL   AST 37 15 - 41 U/L   ALT 45 17 - 63 U/L   Alkaline Phosphatase 79 38 - 126 U/L   Total Bilirubin 0.5 0.3 - 1.2 mg/dL   GFR calc non Af Amer >60 >60 mL/min   GFR calc Af Amer >60 >60 mL/min    Comment: (NOTE) The eGFR has been calculated using the CKD EPI equation. This calculation has not been validated in all clinical situations. eGFR's persistently <60 mL/min signify possible Chronic Kidney Disease.    Anion gap 8 5 - 15  Ethanol (ETOH)     Status: Abnormal   Collection Time: 08/20/15  1:29 PM  Result Value Ref Range   Alcohol, Ethyl  (B) 7 (H) <5 mg/dL    Comment:        LOWEST DETECTABLE LIMIT FOR SERUM ALCOHOL IS 5 mg/dL FOR MEDICAL PURPOSES ONLY   CBC     Status: None   Collection Time: 08/20/15  1:29 PM  Result Value Ref Range   WBC 7.7 3.8 - 10.6 K/uL   RBC 4.93 4.40 - 5.90 MIL/uL   Hemoglobin 16.0 13.0 - 18.0 g/dL   HCT 54.5 61.3 - 27.3 %   MCV 95.9 80.0 - 100.0 fL   MCH 32.5 26.0 - 34.0 pg  MCHC 33.8 32.0 - 36.0 g/dL   RDW 14.5 11.5 - 14.5 %   Platelets 216 150 - 440 K/uL  Urine Drug Screen, Qualitative ([facility] only)     Status: Abnormal   Collection Time: 08/20/15  1:29 PM  Result Value Ref Range   Tricyclic, Ur Screen NONE DETECTED NONE DETECTED   Amphetamines, Ur Screen NONE DETECTED NONE DETECTED   MDMA (Ecstasy)Ur Screen NONE DETECTED NONE DETECTED   Cocaine Metabolite,Ur [place] POSITIVE (A) NONE DETECTED   Opiate, Ur Screen NONE DETECTED NONE DETECTED   Phencyclidine (PCP) Ur S NONE DETECTED NONE DETECTED   Cannabinoid 50 Ng, Ur [place] POSITIVE (A) NONE DETECTED   Barbiturates, Ur Screen NONE DETECTED NONE DETECTED   Benzodiazepine, Ur Scrn POSITIVE (A) NONE DETECTED   Methadone Scn, Ur NONE DETECTED NONE DETECTED    Comment: (NOTE) 119  Tricyclics, urine               Cutoff 1000 ng/mL 200  Amphetamines, urine             Cutoff 1000 ng/mL 300  MDMA (Ecstasy), urine           Cutoff 500 ng/mL 400  Cocaine Metabolite, urine       Cutoff 300 ng/mL 500  Opiate, urine                   Cutoff 300 ng/mL 600  Phencyclidine (PCP), urine      Cutoff 25 ng/mL 700  Cannabinoid, urine              Cutoff 50 ng/mL 800  Barbiturates, urine             Cutoff 200 ng/mL 900  Benzodiazepine, urine           Cutoff 200 ng/mL 1000 Methadone, urine                Cutoff 300 ng/mL 1100 1200 The urine drug screen provides only a preliminary, unconfirmed 1300 analytical test result and should not be used for non-medical 1400 purposes. Clinical consideration and professional judgment should 1500 be applied to any  positive drug screen result due to possible 1600 interfering substances. A more specific alternate chemical method 1700 must be used in order to obtain a confirmed analytical result.  1800 Gas chromato graphy / mass spectrometry (GC/MS) is the preferred 1900 confirmatory method.     Vitals: Blood pressure 136/91, pulse 89, temperature 98.4 F (36.9 C), temperature source Oral, resp. rate 18, height $RemoveBe'5\' 6"'miEuAkwqj$  (1.676 m), weight 56.7 kg (125 lb), SpO2 100 %.  Risk to Self: Suicidal Ideation: Yes-Currently Present Suicidal Intent: Yes-Currently Present Is patient at risk for suicide?: Yes Suicidal Plan?: Yes-Currently Present Specify Current Suicidal Plan: Pt took knife to his throat this morning at his dad's work Access to [employer]: Yes Specify Access to Suicidal Means: Pt. reports having taken a knife to his neck at his dad's work this morning What has been your use of drugs/alcohol within the last 12 months?: ETOH, Cocaine, THC How many times?: 3 Other Self Harm Risks: na Triggers for Past Attempts: Family contact, Other personal contacts Intentional Self Injurious Behavior: None Comment - Self Injurious Behavior: na Risk to Others: Homicidal Ideation: No Thoughts of Harm to Others: No Current Homicidal Intent: No Current Homicidal Plan: No Access to Homicidal Means: No Identified Victim: na History of harm to others?: No Assessment of Violence: None Noted Violent Behavior Description: na Does patient have access  to weapons?: Yes (Comment) (Pt. owns 3 knives) Criminal Charges Pending?: No Does patient have a court date: Yes Court Date: 09/01/15 (child support) Prior Inpatient Therapy: Prior Inpatient Therapy: Yes Prior Therapy Dates: [place], 8/ 2016Rehab approx 5 months 2016, Detox 2016 Prior Therapy Facilty/Provider(s): [place], [facility] ([place] Reason for Treatment: Depression, Substance Use Prior Outpatient Therapy: Prior Outpatient Therapy: No Prior  Therapy Dates: N/A Prior Therapy Facilty/Provider(s): N/A Reason for Treatment: N/A Does patient have an ACCT team?: No Does patient have Intensive In-House Services?  : No Does patient have [facility] services? : No Does patient have P4CC services?: No  Current Facility-Administered Medications  Medication Dose Route Frequency Provider Last Rate Last Dose  . QUEtiapine (SEROQUEL) tablet 100 mg  100 mg Oral TID Gonzella Lex, MD      . QUEtiapine (SEROQUEL) tablet 400 mg  400 mg Oral QHS Gonzella Lex, MD      . sertraline (ZOLOFT) tablet 100 mg  100 mg Oral Daily Gonzella Lex, MD       Current Outpatient Prescriptions  Medication Sig Dispense Refill  . QUEtiapine (SEROQUEL) 100 MG tablet Take 1 tablet (100 mg total) by mouth 3 (three) times daily. (Patient not taking: Reported on 08/20/2015) 90 tablet 0  . QUEtiapine (SEROQUEL) 400 MG tablet Take 1 tablet (400 mg total) by mouth at bedtime. (Patient not taking: Reported on 08/20/2015) 30 tablet 0  . sertraline (ZOLOFT) 100 MG tablet Take 1 tablet (100 mg total) by mouth daily. (Patient not taking: Reported on 08/20/2015) 30 tablet 0    Musculoskeletal: Strength & Muscle Tone: within normal limits Gait & Station: normal Patient leans: N/A  Psychiatric Specialty Exam: Physical Exam  Nursing note and vitals reviewed. Constitutional: He appears well-developed and well-nourished.  HENT:  Head: Normocephalic and atraumatic.  Eyes: Conjunctivae are normal. Pupils are equal, round, and reactive to light.  Neck: Normal range of motion.  Cardiovascular: Normal heart sounds.   Respiratory: Effort normal.  GI: Soft.  Musculoskeletal: Normal range of motion.  Neurological: He is alert.  Skin: Skin is warm and dry.  Psychiatric: His mood appears anxious. His speech is delayed and tangential. He is agitated. Cognition and memory are impaired. He expresses impulsivity. He exhibits a depressed mood. He expresses suicidal ideation. He exhibits  abnormal remote memory.    Review of Systems  Constitutional: Negative.   HENT: Negative.   Eyes: Negative.   Respiratory: Negative.   Cardiovascular: Negative.   Gastrointestinal: Negative.   Musculoskeletal: Negative.   Skin: Negative.   Neurological: Negative.   Psychiatric/Behavioral: Positive for depression, suicidal ideas, memory loss and substance abuse. Negative for hallucinations. The patient is nervous/anxious and has insomnia.     Blood pressure 136/91, pulse 89, temperature 98.4 F (36.9 C), temperature source Oral, resp. rate 18, height _0  (1.676 m), weight 56.7 kg (125 lb), SpO2 100 %.Body mass index is 20.19 kg/(m^2).  General Appearance: Disheveled  Eye Contact::  Minimal  Speech:  Pressured  Volume:  Decreased  Mood:  Anxious, Depressed and Irritable  Affect:  Depressed  Thought Process:  Tangential  Orientation:  Full (Time, Place, and Person)  Thought Content:  Negative  Suicidal Thoughts:  Yes.  with intent/plan  Homicidal Thoughts:  No  Memory:  Immediate;   Fair Recent;   Fair Remote;   Fair  Judgement:  Impaired  Insight:  Lacking  Psychomotor Activity:  Decreased  Concentration:  Poor  Recall:  Poor  Fund of Knowledge:Fair  Language: Fair  Akathisia:  No  Handed:  Right  AIMS (if indicated):     Assets:  Communication Skills Desire for Improvement Physical Health  ADL's:  Intact  Cognition: WNL  Sleep:      Medical Decision Making: Review of Psycho-Social Stressors (1), Review or order clinical lab tests (1), Established Problem, Worsening (2), Review of Medication Regimen & Side Effects (2) and Review of New Medication or Change in Dosage (2)  Treatment Plan Summary: Daily contact with patient to assess and evaluate symptoms and progress in treatment, Medication management and Plan Patient returns to the hospital with intense suicidal ideations and gestures. Has been abusing drugs. Drug screen reviewed positive for cocaine and  benzodiazepine's and try cyclic's. Main drug of abuse has been cocaine. Patient would be a good candidate for hospitalization but we have no beds available. We will initiate a referral to the alcohol and drug abuse treatment center. Restart medicines as previously prescribed. Reevaluate over the weekend. If bed opens up and it is still appropriate to admit him that can be done over the weekend. Otherwise continue management here. Supportive counseling and explained the plan to the patient. Case discussed with emergency room physician. Continue the IVC.  Plan:  Recommend psychiatric Inpatient admission when medically cleared. Supportive therapy provided about ongoing stressors. Discussed crisis plan, support from social network, calling 911, coming to the Emergency Department, and calling Suicide Hotline. Disposition: Admit to the bed becomes available or refer to alcohol and drug abuse treatment center if possible.  Kessler Kopinski 08/20/2015 7:11 PM

## 2015-08-20 NOTE — ED Notes (Signed)

## 2015-08-20 NOTE — ED Notes (Signed)
Received patient ambulatory from quad area. Patient is alert and oriented x 4. Patient made aware of security cameras and 15 minute rounding for safety.  Patient currently denies SI. He was told to contact nurse for any concerns - he verbalizes understanding.

## 2015-08-20 NOTE — ED Notes (Signed)
Pt given supper tray and sprite 

## 2015-08-20 NOTE — ED Provider Notes (Signed)
[facility] Emergency Department Provider Note     Time seen: ----------------------------------------- 1:42 PM on 08/20/2015 -----------------------------------------    I have reviewed the triage vital signs and the nursing notes.   HISTORY  Chief Complaint Depression    HPI Carlos French is a 24 y.o. male who presents to ER being sent by his father. Patient states he tried to kill himself this morning using a knife to his throat. States she's had a lot going on, hasn't seen his on for years that he is homeless. States his brothers died that he needs help. States he drinks alcohol daily and last used cocaine 2 days ago.   Past Medical History  Diagnosis Date  . GI bleeding   . GERD (gastroesophageal reflux disease)   . Anxiety   . Depression   . PTSD (post-traumatic stress disorder)   . Bipolar 1 disorder   . Allergy   . GI (gastrointestinal bleed)     Patient Active Problem List   Diagnosis Date Noted  . Bipolar disorder, current episode depressed, severe, without psychotic features 08/11/2015  . Cocaine use disorder, severe, dependence 08/11/2015  . Alcohol use disorder, severe, dependence 08/11/2015  . Cannabis use disorder, severe, dependence 08/11/2015    History reviewed. No pertinent past surgical history.  Allergies Review of patient's allergies indicates no known allergies.  Social History Social History  Substance Use Topics  . Smoking status: Current Every Day Smoker -- 0.75 packs/day    Types: Cigarettes  . Smokeless tobacco: None  . Alcohol Use: Yes     Comment: 4 beer cans (24 oz) every day     Review of Systems Constitutional: Negative for fever. Eyes: Negative for visual changes. ENT: Negative for sore throat. Cardiovascular: Negative for chest pain. Respiratory: Negative for shortness of breath. Gastrointestinal: Negative for abdominal pain, vomiting and diarrhea. Genitourinary: Negative for  dysuria. Musculoskeletal: Negative for back pain. Skin: Negative for rash. Neurological: Negative for headaches, focal weakness or numbness. Psychiatric: Patient with positive depression and suicidal ideation. Patient also with alcohol and drug abuse  10-point ROS otherwise negative.  ____________________________________________   PHYSICAL EXAM:  VITAL SIGNS: ED Triage Vitals  Enc Vitals Group     BP 08/20/15 1325 142/75 mmHg     Pulse Rate 08/20/15 1325 88     Resp 08/20/15 1325 20     Temp 08/20/15 1325 98.2 F (36.8 C)     Temp Source 08/20/15 1325 Oral     SpO2 08/20/15 1325 98 %     Weight 08/20/15 1325 125 lb (56.7 kg)     Height 08/20/15 1325  (1.676 m)     Head Cir --      Peak Flow --      Pain Score 08/20/15 1326 0     Pain Loc --      Pain Edu? --      Excl. in GC? --     Constitutional: Alert and oriented. Tearful, no acute distress Eyes: Conjunctivae are normal. PERRL. Normal extraocular movements. ENT   Head: Normocephalic and atraumatic.   Nose: No congestion/rhinnorhea.   Mouth/Throat: Mucous membranes are moist.   Neck: No stridor. Cardiovascular: Normal rate, regular rhythm. Normal and symmetric distal pulses are present in all extremities. No murmurs, rubs, or gallops. Respiratory: Normal respiratory effort without tachypnea nor retractions. Breath sounds are clear and equal bilaterally. No wheezes/rales/rhonchi. Gastrointestinal: Soft and nontender. No distention. No abdominal bruits.  Musculoskeletal: Nontender with normal range  of motion in all extremities. No joint effusions.  No lower extremity tenderness nor edema. Neurologic:  Normal speech and language. No gross focal neurologic deficits are appreciated. Speech is normal. No gait instability. Skin:  Skin is warm, dry and intact. No rash noted. Psychiatric: Patient with depressed mood and affect, positive suicidal ideation  ____________________________________________  ED  COURSE:  Pertinent labs & imaging results that were available during my care of the patient were reviewed by me and considered in my medical decision making (see chart for details). Patient is no acute distress, does need inpatient psychiatric evaluation. He will be involuntarily committed. ____________________________________________    LABS (pertinent positives/negatives)  Labs Reviewed  COMPREHENSIVE METABOLIC PANEL - Abnormal; Notable for the following:    Glucose, Bld 104 (*)    All other components within normal limits  ETHANOL - Abnormal; Notable for the following:    Alcohol, Ethyl (B) 7 (*)    All other components within normal limits  URINE DRUG SCREEN, QUALITATIVE ([facility] ONLY) - Abnormal; Notable for the following:    Cocaine Metabolite,Ur [place] POSITIVE (*)    Cannabinoid 50 Ng, Ur [place] POSITIVE (*)    Benzodiazepine, Ur Scrn POSITIVE (*)    All other components within normal limits  CBC    ____________________________________________  FINAL ASSESSMENT AND PLAN  Depression, suicidal ideation, polysubstance and alcohol abuse  Plan: Patient with labs and imaging as dictated above. Patient meets criteria for inpatient hospitalization and psychiatric evaluation. This is been initiated with involuntary commitment and psychiatric consultation   Emily Filbert, MD   Emily Filbert, MD 08/20/15 [phone]

## 2015-08-20 NOTE — ED Notes (Signed)
BEHAVIORAL HEALTH ROUNDING  Patient sleeping: No.  Patient alert and oriented: yes  Behavior appropriate: Yes. ; If no, describe:  Nutrition and fluids offered: Yes  Toileting and hygiene offered: Yes  Sitter present:Q15 min rounding on pt for safety  Law enforcement present: Yes ODS

## 2015-08-20 NOTE — ED Notes (Signed)
TTS at bedside. 

## 2015-08-20 NOTE — ED Notes (Signed)
Pt states he tried to kill himself this AM in front of his dad using a knife to his throat, states a lot is going on "I havent seen my little girl in 4 years, im homeless, and my brother died", pt states "I need help", pt states he could not afford medications so he has have been off his meds for a month, pt states SI but denies HI, states he drinks everyday 4 40oz beers and used cocaine 2 days ago and marijuina this AM, states last beer he had was this AM

## 2015-08-20 NOTE — ED Notes (Signed)

## 2015-08-20 NOTE — ED Notes (Signed)
Pt to ed with c/o depression and suicidal ideation this am,  Per father with pt, states pt took a knife this am and put it to his throat and threatened to kill himself.  Pt states he is homeless and doe not have a job and uses drugs and alcohol.

## 2015-08-20 NOTE — ED Notes (Signed)

## 2015-08-21 MED ORDER — QUETIAPINE FUMARATE 200 MG PO TABS
ORAL_TABLET | ORAL | Status: AC
  Filled 2015-08-21: qty 1

## 2015-08-21 MED ORDER — NICOTINE 10 MG IN INHA
RESPIRATORY_TRACT | Status: AC
  Filled 2015-08-21: qty 36

## 2015-08-21 MED ORDER — NICOTINE 10 MG IN INHA
1.0000 | RESPIRATORY_TRACT | Status: DC | PRN
  Administered 2015-08-21 (×2): 1 via RESPIRATORY_TRACT

## 2015-08-21 NOTE — ED Notes (Signed)
Report given to Minerva Areola, RN on [facility]. Pt to be admitted to Rm. [phone]

## 2015-08-21 NOTE — ED Notes (Signed)
BEHAVIORAL HEALTH ROUNDING Patient sleeping: Yes.   Patient alert and oriented: not applicable SLEEPING Behavior appropriate: Yes.  ; If no, describe: SLEEPING Nutrition and fluids offered: No SLEEPING Toileting and hygiene offered: NoSLEEPING Sitter present: not applicable Law enforcement present: Yes ODS 

## 2015-08-21 NOTE — ED Notes (Signed)
Snack and drink provided to pt.  

## 2015-08-21 NOTE — ED Notes (Signed)
BEHAVIORAL HEALTH ROUNDING Patient sleeping: Yes.   Patient alert and oriented: yes Behavior appropriate: Yes.  ;  Nutrition and fluids offered: Yes  Toileting and hygiene offered: Yes  Sitter present: yes Law enforcement present: Yes  

## 2015-08-21 NOTE — B[facility] Counselor (Signed)
Pt. is to be admitted to [facility] by Dr. Guss Bunde. Attending Physician will be Dr. Ardyth Harps.  Pt. has been assigned to room 306A, by [facility] Charge Nurse [place], RD.  Intake Paper Work has been signed and placed on pt. chart. ER staff [facility]; Audree Camel Patient's Nurse & [place] Patient Access) have been made aware of the admission.

## 2015-08-21 NOTE — ED Notes (Signed)
BEHAVIORAL HEALTH ROUNDING Patient sleeping: Yes.   Patient alert and oriented: yes Behavior appropriate: Yes.  ;  Nutrition and fluids offered: Yes  Toileting and hygiene offered: Yes  Sitter present: no Law enforcement present: Yes  

## 2015-08-21 NOTE — ED Notes (Signed)
BEHAVIORAL HEALTH ROUNDING Patient sleeping: No. Patient alert and oriented: yes Behavior appropriate: Yes.  ;  Nutrition and fluids offered: Yes  Toileting and hygiene offered: Yes  Sitter present: no Law enforcement present: Yes   

## 2015-08-21 NOTE — ED Notes (Addendum)
Report received from Denia, RN  Fatima, Intake Specialist, called for order of [facility] admission. Per Intake [facility] has had two call outs and will not receive patients tonight.  Pt notified. 

## 2015-08-21 NOTE — ED Notes (Signed)
BEHAVIORAL HEALTH ROUNDING Patient sleeping: No. Patient alert and oriented: yes Behavior appropriate: Yes.  ; If no, describe:   Nutrition and fluids offered: Yes  Toileting and hygiene offered: Yes  Sitter present: no Law enforcement present: Yes  and ODS  

## 2015-08-21 NOTE — ED Provider Notes (Signed)
-----------------------------------------   7:59 PM on 08/21/2015 -----------------------------------------  Mr. Carlos French 24 year old male who was brought to the emergency department because of an attempt to kill himself. Apparently he try to use a knife to cut his throat.  This patient was initially seen by Daryel November, M.D., in place under an involuntary commitment due to his risk to self.  The patient has been seen by Dr. Guss Bunde, psychiatry. She will admit patient for ongoing care.  Darien Ramus, MD 08/21/15 2001

## 2015-08-21 NOTE — BHH Counselor (Signed)
Authorization Request for Psych Inpt. Treatment from [employer] completed and submitted. [id number].

## 2015-08-21 NOTE — ED Notes (Signed)
ED BHU PLACEMENT JUSTIFICATION Is the patient under IVC or is there intent for IVC: Yes.   Is the patient medically cleared: Yes.   Is there vacancy in the ED BHU: Yes.   Is the population mix appropriate for patient: Yes.   Is the patient awaiting placement in inpatient or outpatient setting: Yes.   Has the patient had a psychiatric consult: Yes.   Survey of unit performed for contraband, proper placement and condition of furniture, tampering with fixtures in bathroom, shower, and each patient room: Yes.  ;  APPEARANCE/BEHAVIOR calm, cooperative and adequate rapport can be established NEURO ASSESSMENT Orientation: time, place and person Hallucinations: No.None noted (Hallucinations) Speech: Normal Gait: normal RESPIRATORY ASSESSMENT Normal expansion.  Clear to auscultation.  No rales, rhonchi, or wheezing. CARDIOVASCULAR ASSESSMENT regular rate and rhythm, S1, S2 normal, no murmur, click, rub or gallop GASTROINTESTINAL ASSESSMENT soft, nontender, BS WNL, no r/g EXTREMITIES normal strength, tone, and muscle mass PLAN OF CARE Provide calm/safe environment. Vital signs assessed twice daily. ED BHU Assessment once each 12-hour shift. Collaborate with intake RN daily or as condition indicates. Assure the ED provider has rounded once each shift. Provide and encourage hygiene. Provide redirection as needed. Assess for escalating behavior; address immediately and inform ED provider.  Assess family dynamic and appropriateness for visitation as needed: Yes.  ; If necessary, describe findings: none Educate the patient/family about BHU procedures/visitation: Yes.  ; If necessary, describe findings: none

## 2015-08-21 NOTE — BHH Counselor (Signed)
Information faxed to [employer] to receive tracking number for [facility]. Received the tracking #[id number] for Pt. Faxed referral for [facility].

## 2015-08-21 NOTE — ED Notes (Signed)
Lord, MD at bedside at this time.

## 2015-08-21 NOTE — ED Notes (Signed)
Pt sitting up in bed eating dinner at this time.  

## 2015-08-21 NOTE — ED Notes (Signed)
[place], MD at bedside at this time.  
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
## 2015-08-21 NOTE — ED Notes (Signed)
Pt resting in room. Easily awakens. Informed breakfast tray placed to bedside.

## 2015-08-21 NOTE — ED Notes (Signed)

## 2015-08-21 NOTE — ED Notes (Signed)
ED BHU PLACEMENT JUSTIFICATION Is the patient under IVC or is there intent for IVC: Yes.   Is the patient medically cleared: Yes.   Is there vacancy in the ED BHU: Yes.   Is the population mix appropriate for patient: Yes.   Is the patient awaiting placement in inpatient or outpatient setting: Yes.   Has the patient had a psychiatric consult: Yes.   Survey of unit performed for contraband, proper placement and condition of furniture, tampering with fixtures in bathroom, shower, and each patient room: Yes.  ; Findings: all clear APPEARANCE/BEHAVIOR calm, cooperative and adequate rapport can be established NEURO ASSESSMENT Orientation: time, place and person Hallucinations: No.None noted (Hallucinations) Speech: Normal Gait: normal RESPIRATORY ASSESSMENT Normal expansion.  Clear to auscultation.  No rales, rhonchi, or wheezing. CARDIOVASCULAR ASSESSMENT regular rate and rhythm, S1, S2 normal, no murmur, click, rub or gallop GASTROINTESTINAL ASSESSMENT soft, nontender, BS WNL, no r/g EXTREMITIES normal strength, tone, and muscle mass PLAN OF CARE Provide calm/safe environment. Vital signs assessed twice daily. ED BHU Assessment once each 12-hour shift. Collaborate with intake RN daily or as condition indicates. Assure the ED provider has rounded once each shift. Provide and encourage hygiene. Provide redirection as needed. Assess for escalating behavior; address immediately and inform ED provider.  Assess family dynamic and appropriateness for visitation as needed: No.; If necessary, describe findings:   Educate the patient/family about BHU procedures/visitation: Yes.  ; If necessary, describe findings:    BEHAVIORAL HEALTH ROUNDING Patient sleeping: No. Patient alert and oriented: yes Behavior appropriate: Yes.  ; If no, describe:   Nutrition and fluids offered: Yes  Toileting and hygiene offered: Yes  Sitter present: no Law enforcement present: Yes  and ODS  ENVIRONMENTAL  ASSESSMENT Potentially harmful objects out of patient reach: Yes.   Personal belongings secured: Yes.   Patient dressed in hospital provided attire only: Yes.   Plastic bags out of patient reach: Yes.   Patient care equipment (cords, cables, call bells, lines, and drains) shortened, removed, or accounted for: Yes.   Equipment and supplies removed from bottom of stretcher: Yes.   Potentially toxic materials out of patient reach: Yes.   Sharps container removed or out of patient reach: Yes.

## 2015-08-21 NOTE — ED Notes (Signed)
ENVIRONMENTAL ASSESSMENT  Potentially harmful objects out of patient reach: Yes.  Personal belongings secured: Yes.  Patient dressed in hospital provided attire only: Yes.  Plastic bags out of patient reach: Yes.  Patient care equipment (cords, cables, call bells, lines, and drains) shortened, removed, or accounted for: Yes.  Equipment and supplies removed from bottom of stretcher: Yes.  Potentially toxic materials out of patient reach: Yes.  Sharps container removed or out of patient reach: Yes.   BEHAVIORAL HEALTH ROUNDING Patient sleeping: Yes.   Patient alert and oriented: not applicable SLEEPING Behavior appropriate: Yes.  ; If no, describe: SLEEPING Nutrition and fluids offered: No SLEEPING Toileting and hygiene offered: NoSLEEPING Sitter present: not applicable Law enforcement present: Yes ODS 

## 2015-08-21 NOTE — ED Notes (Signed)
BEHAVIORAL HEALTH ROUNDING Patient sleeping: Yes.   Patient alert and oriented: NA Behavior appropriate: Yes.  ; If no, describe:   Nutrition and fluids offered: No Toileting and hygiene offered: No Sitter present: no Law enforcement present: Yes  and ODS 

## 2015-08-21 NOTE — ED Notes (Signed)
Carlos French is IVC will be going down to behavior unit after shift change

## 2015-08-21 NOTE — ED Notes (Signed)
Pt requesting nicotine inhaler states he normally smokes a pack and a half a day Carollee Massed, MD informed new orders received.

## 2015-08-21 NOTE — BHH Counselor (Addendum)
Charted on the wrong patient's note.

## 2015-08-22 ENCOUNTER — Inpatient Hospital Stay
Admission: EM | Admit: 2015-08-22 | Discharge: 2015-08-24 | DRG: 885 | Disposition: A | Discharge status: Home / Self Care | Payer: No Typology Code available for payment source | Source: Intra-hospital | Attending: Psychiatry | Admitting: Psychiatry

## 2015-08-22 ENCOUNTER — Encounter: Payer: Self-pay | Admitting: Behavioral Health

## 2015-08-22 DIAGNOSIS — F1020 Alcohol dependence, uncomplicated: Secondary | ICD-10-CM | POA: Diagnosis present

## 2015-08-22 DIAGNOSIS — F314 Bipolar disorder, current episode depressed, severe, without psychotic features: Secondary | ICD-10-CM | POA: Diagnosis not present

## 2015-08-22 DIAGNOSIS — K219 Gastro-esophageal reflux disease without esophagitis: Secondary | ICD-10-CM | POA: Diagnosis present

## 2015-08-22 DIAGNOSIS — G4700 Insomnia, unspecified: Secondary | ICD-10-CM | POA: Diagnosis present

## 2015-08-22 DIAGNOSIS — Z560 Unemployment, unspecified: Secondary | ICD-10-CM | POA: Diagnosis present

## 2015-08-22 DIAGNOSIS — F331 Major depressive disorder, recurrent, moderate: Secondary | ICD-10-CM | POA: Diagnosis present

## 2015-08-22 DIAGNOSIS — Z915 Personal history of self-harm: Secondary | ICD-10-CM | POA: Diagnosis not present

## 2015-08-22 DIAGNOSIS — R45851 Suicidal ideations: Secondary | ICD-10-CM | POA: Diagnosis present

## 2015-08-22 DIAGNOSIS — Z79899 Other long term (current) drug therapy: Secondary | ICD-10-CM

## 2015-08-22 DIAGNOSIS — Z590 Homelessness: Secondary | ICD-10-CM | POA: Diagnosis not present

## 2015-08-22 DIAGNOSIS — F10239 Alcohol dependence with withdrawal, unspecified: Secondary | ICD-10-CM | POA: Diagnosis present

## 2015-08-22 DIAGNOSIS — F10939 Alcohol use, unspecified with withdrawal, unspecified: Secondary | ICD-10-CM

## 2015-08-22 DIAGNOSIS — F1220 Cannabis dependence, uncomplicated: Secondary | ICD-10-CM | POA: Diagnosis present

## 2015-08-22 DIAGNOSIS — F17210 Nicotine dependence, cigarettes, uncomplicated: Secondary | ICD-10-CM | POA: Diagnosis present

## 2015-08-22 DIAGNOSIS — F603 Borderline personality disorder: Secondary | ICD-10-CM | POA: Diagnosis present

## 2015-08-22 DIAGNOSIS — F1420 Cocaine dependence, uncomplicated: Secondary | ICD-10-CM | POA: Diagnosis present

## 2015-08-22 DIAGNOSIS — F319 Bipolar disorder, unspecified: Secondary | ICD-10-CM | POA: Diagnosis present

## 2015-08-22 DIAGNOSIS — F17200 Nicotine dependence, unspecified, uncomplicated: Secondary | ICD-10-CM

## 2015-08-22 DIAGNOSIS — Z823 Family history of stroke: Secondary | ICD-10-CM | POA: Diagnosis not present

## 2015-08-22 MED ORDER — ALUM & MAG HYDROXIDE-SIMETH 200-200-20 MG/5ML PO SUSP
30.0000 mL | ORAL | Status: DC | PRN
  Filled 2015-08-22: qty 30

## 2015-08-22 MED ORDER — TRAZODONE HCL 100 MG PO TABS: 100.0000 mg | ORAL_TABLET | Freq: Every evening | ORAL | Status: DC | PRN

## 2015-08-22 MED ORDER — QUETIAPINE FUMARATE 100 MG PO TABS
100.0000 mg | ORAL_TABLET | Freq: Three times a day (TID) | ORAL | Status: DC
  Administered 2015-08-22 – 2015-08-23 (×2): 100 mg via ORAL
  Filled 2015-08-22 (×2): qty 1

## 2015-08-22 MED ORDER — ACETAMINOPHEN 325 MG PO TABS: 650.0000 mg | ORAL_TABLET | Freq: Four times a day (QID) | ORAL | Status: DC | PRN

## 2015-08-22 MED ORDER — QUETIAPINE FUMARATE 200 MG PO TABS
400.0000 mg | ORAL_TABLET | Freq: Every day | ORAL | Status: DC
  Administered 2015-08-22: 400 mg via ORAL
  Filled 2015-08-22: qty 2

## 2015-08-22 MED ORDER — MAGNESIUM HYDROXIDE 400 MG/5ML PO SUSP: 30.0000 mL | Freq: Every day | ORAL | Status: DC | PRN

## 2015-08-22 MED ORDER — SERTRALINE HCL 100 MG PO TABS
100.0000 mg | ORAL_TABLET | Freq: Every day | ORAL | Status: DC
  Administered 2015-08-23 – 2015-08-24 (×2): 100 mg via ORAL
  Filled 2015-08-22 (×2): qty 1

## 2015-08-22 MED ORDER — TRAZODONE HCL 100 MG PO TABS
100.0000 mg | ORAL_TABLET | Freq: Every evening | ORAL | Status: DC | PRN
  Administered 2015-08-22 (×2): 100 mg via ORAL
  Filled 2015-08-22 (×2): qty 1

## 2015-08-22 MED ORDER — NICOTINE 10 MG IN INHA: 1.0000 | RESPIRATORY_TRACT | Status: DC | PRN

## 2015-08-22 NOTE — Progress Notes (Signed)
D: Pt is awake and active in the milieu this evening. Pt mood is appropriate and his affect is sad. Pt denies SI/HI and AVH and reports feeling much better today.   A: Writer provided emotional support and administered medications as prescribed. Writer reinforced the importance of participation in a 12 step recovery program as well as compliance with discharge plan.   R: Pt is receptive to staff feedback and seems to be vested in his recovery from poly-substance abuse. Pt is attending groups and is pleasant and cooperative with staff. Pt intends to get a sponsor after discharge and attend AA meetings regularly as well as f/u with [facility] on an outpatient basis.
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
## 2015-08-22 NOTE — Progress Notes (Signed)
Patient is pleasant and cooperative, notes no needs or distress, and is active in the milieu. He is medication compliant. He denies SI, HI, and AVH. He notes lessening withdrawal symptoms and relief with current medications.

## 2015-08-22 NOTE — Tx Team (Signed)
Initial Interdisciplinary Treatment Plan   PATIENT STRESSORS: Financial difficulties Marital or family conflict Medication change or noncompliance Substance abuse   PATIENT STRENGTHS: Ability for insight Average or above average intelligence Communication skills General fund of knowledge Motivation for treatment/growth Physical Health Supportive family/friends   PROBLEM LIST: Problem List/Patient Goals Date to be addressed Date deferred Reason deferred Estimated date of resolution  Substance Abuse 08/22/2015   08/29/2015  Suicidal Thoughts 08/22/2015   08/29/2015  Homelessness 08/22/2015   08/29/2015  Depression 08/22/2015   08/29/2015                                 DISCHARGE CRITERIA:  Adequate post-discharge living arrangements Improved stabilization in mood, thinking, and/or behavior Need for constant or close observation no longer present Reduction of life-threatening or endangering symptoms to within safe limits Verbal commitment to aftercare and medication compliance Withdrawal symptoms are absent or subacute and managed without 24-hour nursing intervention  PRELIMINARY DISCHARGE PLAN: Attend 12-step recovery group Outpatient therapy Placement in alternative living arrangements  PATIENT/FAMIILY INVOLVEMENT: This treatment plan has been presented to and reviewed with the patient, Carlos French, and/or family member.  The patient and family have been given the opportunity to ask questions and make suggestions.  Kearsten Ginther Shari Prows 08/22/2015, 1:02 AM
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
## 2015-08-23 DIAGNOSIS — F331 Major depressive disorder, recurrent, moderate: Principal | ICD-10-CM

## 2015-08-23 DIAGNOSIS — F17200 Nicotine dependence, unspecified, uncomplicated: Secondary | ICD-10-CM

## 2015-08-23 DIAGNOSIS — F10939 Alcohol use, unspecified with withdrawal, unspecified: Secondary | ICD-10-CM

## 2015-08-23 DIAGNOSIS — F10239 Alcohol dependence with withdrawal, unspecified: Secondary | ICD-10-CM

## 2015-08-23 MED ORDER — HYDROXYZINE HCL 50 MG PO TABS
50.0000 mg | ORAL_TABLET | Freq: Three times a day (TID) | ORAL | Status: DC
  Administered 2015-08-23 – 2015-08-24 (×3): 50 mg via ORAL
  Filled 2015-08-23 (×4): qty 1

## 2015-08-23 MED ORDER — HYDROXYZINE HCL 50 MG PO TABS: 50.0000 mg | ORAL_TABLET | Freq: Three times a day (TID) | ORAL | Status: DC

## 2015-08-23 MED ORDER — SERTRALINE HCL 100 MG PO TABS: 100.0000 mg | ORAL_TABLET | Freq: Every day | ORAL | Status: DC

## 2015-08-23 MED ORDER — TRAZODONE HCL 50 MG PO TABS
150.0000 mg | ORAL_TABLET | Freq: Every evening | ORAL | Status: DC | PRN
  Administered 2015-08-23: 150 mg via ORAL
  Filled 2015-08-23: qty 3

## 2015-08-23 MED ORDER — TRAZODONE HCL 150 MG PO TABS: 150.0000 mg | ORAL_TABLET | Freq: Every evening | ORAL | Status: DC | PRN

## 2015-08-23 NOTE — Progress Notes (Signed)
Patient has had a disheveled appearance today and mood has been labile. He is upset that the physician discontinued Seroquel. Attending some groups. Hoping for discharge tomorrow. Denies SI/HI/AVH.

## 2015-08-23 NOTE — Progress Notes (Signed)
LCSW and Dr Rexene Edison consulted, this worker will meet with patient and apply diversion strategies and assist patient himself to look at other resources / measures patient will access besides hospital and ED visits.

## 2015-08-23 NOTE — [facility] Group Notes (Signed)
[facility] LCSW Group Therapy  08/23/2015 4:20 PM  Type of Therapy:  Group Therapy  Participation Level:  Active  Participation Quality:  Attentive  Affect:  Appropriate  Cognitive:  Appropriate  Insight:  Distracting  Engagement in Therapy:  Developing/Improving  Modes of Intervention:  Confrontation, Discussion, Education and Support  Summary of Progress/Problems:This patient was angry when he first came to group but was supported by staff and his peers. He was able to share his feelings and was validated by his peers. He was a good participant and was receptive to other forms of communication.  Johnella Moloney, Annick Dimaio M 08/23/2015, 4:20 PM

## 2015-08-23 NOTE — [facility] Group Notes (Signed)
[facility] Group Notes:  (Nursing/MHT/Case Management/Adjunct)  Date:  08/23/2015  Time:  4:35 PM  Type of Therapy:  Group Therapy  Participation Level:  Active  Participation Quality:  Appropriate  Affect:  Appropriate  Cognitive:  Appropriate  Insight:  Good  Engagement in Group:  Engaged  Modes of Intervention:  Discussion  Summary of Progress/Problems:  Carlos French 08/23/2015, 4:35 PM
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
## 2015-08-23 NOTE — [facility] Group Notes (Addendum)
[facility] LCSW Aftercare Discharge Planning Group Note  08/23/2015 4:26 PM  Participation Quality:  DID not attend  Affect:    Cognitive:    Insight:    Engagement in Group:    Modes of Intervention:    Summary of Progress/Problems:  Carlos French 08/23/2015, 4:26 PM

## 2015-08-23 NOTE — Progress Notes (Signed)
LCSW called his father to confirm that his son will follow up with [facility] - Patient is agreeable to follow up with Lorella Nimrod. He will not reside at [facility] but will consider staying with friends.  No beds available at [facility] informed ( on vacation)

## 2015-08-23 NOTE — Progress Notes (Signed)
Recreation Therapy Notes  Date: 08.29.16 Time: 3:00 pm Location: Craft Room  Group Topic: Self-expression  Goal Area(s) Addresses:  Patient will be able to identify a color that represents each emotion. Patient will verbalize why it is important to express emotions.  Behavioral Response: Attentive, Interactive  Intervention: The Colors Within Me  Activity: Patients were given a blank face worksheet and instructed to analyze what emotions they were feeling, pick a color for each emotion, and show how much of that emotion they felt on the face.   Education: LRT educated patients on different forms of self-expression.  Education Outcome: Acknowledges education/In group clarification offered  Clinical Observations/Feedback: Patient picked colors for the emotions he was feeling and showed how much of the emotion on the worksheet. Patient contributed to group discussion by stating what emotions he was feeling, that his emotions are dynamic, what steps he is taking to change his emotions, and why it is important to express his emotions.  Jacquelynn Cree, LRT/CTRS 08/23/2015 4:38 PM

## 2015-08-24 NOTE — [facility] Suicide Risk Assessment (Signed)
[facility] INPATIENT:  Family/Significant Other Suicide Prevention Education  Suicide Prevention Education: Provided directly to patient by LCSW Education Completed; Mr Hazard ( Father) has been identified by the patient as the family member/significant other with whom the patient will be residing, and identified as the person(s) who will aid the patient in the event of a mental health crisis (suicidal ideations/suicide attempt).  With written consent from the patient, the family member/significant other has been provided the following suicide prevention education, prior to the and/or following the discharge of the patient.  The suicide prevention education provided includes the following:  Suicide risk factors  Suicide prevention and interventions  National Suicide Hotline telephone number  [facility] assessment telephone number  [facility] Emergency Assistance 911  [facility] and/or Residential Mobile Crisis Unit telephone number  Request made of family/significant other to:  Remove weapons (e.g., guns, rifles, knives), all items previously/currently identified as safety concern.    Remove drugs/medications (over-the-counter, prescriptions, illicit drugs), all items previously/currently identified as a safety concern.  The family member/significant other verbalizes understanding of the suicide prevention education information provided.  The family member/significant other agrees to remove the items of safety concern listed above.  Johnella Moloney, Lakie Mclouth M 08/24/2015, 8:45 AM

## 2015-08-24 NOTE — Progress Notes (Signed)
Patient verbalized understanding rt recommended discharge plan of care. 7 day supply of meds given to patient. No SI/HI/AVH at this time. Patient confirms return of all belongings and safety maintained. Patient discharges at this time to his private car.

## 2015-08-24 NOTE — Discharge Summary (Signed)
Physician Discharge Summary Note  Patient:  Carlos French is an 24 y.o., male MRN:  [id number] DOB:  03-Jul-1991 Patient phone:  [phone] (home)  Patient address:   [address] [place] [address],  Total Time spent with patient: 30 minutes  Date of Admission:  08/22/2015 Date of Discharge: 08/24/2015  Reason for Admission:  Suicidality  Principal Problem: Major depressive disorder, recurrent episode, moderate with melancholic features Discharge Diagnoses: Patient Active Problem List   Diagnosis Date Noted  . Alcohol withdrawal [F10.239] 08/23/2015  . Tobacco use disorder [Z72.0] 08/23/2015  . Major depressive disorder, recurrent episode, moderate with melancholic features [U13.2] 08/23/2015  . 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 Cocaine use disorder, severe, dependence [F14.20] 08/11/2015  . Alcohol use disorder, severe, dependence [F10.20] 08/11/2015  . Cannabis use disorder, severe, dependence [F12.20] 08/11/2015    Musculoskeletal: Strength & Muscle Tone: within normal limits Gait & Station: normal Patient leans: N/A  Psychiatric Specialty Exam: Physical Exam  Review of Systems  Constitutional: Negative.   HENT: Negative.   Eyes: Negative.   Respiratory: Negative.   Cardiovascular: Negative.   Gastrointestinal: Negative.   Genitourinary: Negative.   Musculoskeletal: Negative.   Skin: Negative.   Neurological: Negative.   Endo/Heme/Allergies: Negative.   Psychiatric/Behavioral: Negative.     Blood pressure 120/84, pulse 94, temperature 98.1 F (36.7 C), temperature source Oral, resp. rate 20, height _0  (1.676 m), weight 58.968 kg (130 lb).Body mass index is 20.99 kg/(m^2).  General Appearance: Well Groomed  Engineer, water::  Good  Speech:  Clear and Coherent  Volume:  Normal  Mood:  Euthymic  Affect:  Congruent  Thought Process:  Linear  Orientation:  Full (Time, Place, and Person)  Thought Content:  Hallucinations: None  Suicidal  Thoughts:  No  Homicidal Thoughts:  No  Memory:  Immediate;   Good Recent;   Good Remote;   Good  Judgement:  Poor  Insight:  Shallow  Psychomotor Activity:  Normal  Concentration:  NA  Recall:  NA  Fund of Knowledge:Good  Language: Good  Akathisia:  No  Handed:    AIMS (if indicated):     Assets:  Geophysical data processor  ADL's:  Intact  Cognition: WNL  Sleep:  Number of Hours: 6   History of Present Illness:  Identifying data. Carlos French is a 24 year old male with a history of bipolar illness and substance use.  Chief complaint. "It was stupid."  History of present illness. Carlos French has a long history of depression, mood instability, and substance use. He participated in substance abuse treatment program in [place] and was able to maintain sobriety for 6 months while enrolled. He left the program a month ago. He relapsed on alcohol immediately. He was admitted to [facility] for alcohol detox and depressive symptoms with suicidal ideation. At that time she was not interested in residential treatment and wanted to handle it himself. Following discharge he relapsed immediately on alcohol, cocaine, benzodiazepine and cannabinoids. He became increasingly depressed with poor sleep and decreased appetite, anhedonia, feeling of guilt and hopelessness worthlessness, poor energy and concentration, social isolation, crying spells, heightened anxiety. He went to his father's workplace and put a knife to his neck threatening to kill himself. It is unclear why he showed up at his father's work. The father is supportive but would not allow the patient to stay at his house. The patient has been living with friends and in his truck. Carlos French did  not take any medications since discharge. He did not follow-up psychiatry provider. He denies psychotic symptoms. He denies symptoms suggestive of bipolar mania.   Past psychiatric history. There were  several admissions at least 2 to [facility] for mood instability and suicidal ideation and alcoholism. There is a history of self-injurious behavior. He has not been taking any medications lately. In the past he did well on a combination of Depakote and Seroquel. During his latest admission he refuses Depakote.  Family psychiatric history. None reported.  Social history. He is homeless, unemployed and uninsured. He still has a car. His family is estranged except for the father brought him to the hospital.  Total Time spent with patient: 1 hour  Past Medical History:  Past Medical History  Diagnosis Date  . GI bleeding   . GERD (gastroesophageal reflux disease)   . Anxiety   . Depression   . PTSD (post-traumatic stress disorder)   . Bipolar 1 disorder   . Allergy   . GI (gastrointestinal bleed)    History reviewed. No pertinent past surgical history. Family History:  Family History  Problem Relation Age of Onset  . Stroke Mother    Social History:  History  Alcohol Use  . Yes    Comment: 4 beer cans (24 oz) every day     History  Drug Use  . Yes  . Special: Marijuana, Cocaine    Social History   Social History  . Marital Status: Single    Spouse Name: N/A  . Number of Children: N/A  . Years of Education: N/A   Social History Main Topics  . Smoking status: Current Every Day Smoker -- 0.75 packs/day    Types: Cigarettes  . Smokeless tobacco: None  . Alcohol Use: Yes     Comment: 4 beer cans (24 oz) every day   . Drug Use: Yes    Special: Marijuana, Cocaine  . Sexual Activity: No         Hospital Course:   Daily contact with patient to assess and evaluate symptoms and progress in treatment and Medication management   Major depressive disorder: Patient reports that he is started drinking at the age of 26 after his brother died in a car  accident. Since then the patient has been drinking heavily a has failed to maintain sobriety for significant periods of time. I will continue Zoloft 100 mg by mouth daily  Insomnia: I will start the patient on trazodone 150 mg by mouth daily at bedtime. Due to his history of substance abuse I will discontinue Seroquel.  History of bipolar disorder:  Interview today appears that patient does not meet criteria for bipolar disorder but instead of 4 borderline traits versus disorder.  Alcohol withdrawal: Mild alcohol withdrawal at this time patient is not in need of benzodiazepine  Tobacco use disorder: Patient will be continue Nicotrol Inhaler when necessary  Discharge planning:   SW: Peer support Sherrian Divers Number was provided to patient. AA resource list was also provided to patient and patient encouraged to attend and get a sponsor.  LCSW spoke with patients father who has arranged for his son to contact [facility] Evon Slack) [phone] who is awaiting the patient to call. This is a long term -spiritual based treatment shelter.  In meeting with patient he again refused Museum/gallery exhibitions officer, resource list from [facility] provided,suicide education handout,and patient has agreed to follow up with [facility] upon discharge. In discussion  with the patient he reports he is not suicidal or homicidal or experiencing AH/VH.   On the day of the discharge patient denied SI, HI or having auditory or visual hallucinations. Patient denied problems with appetite and sleep and energy or concentration. Patient was calm, pleasant and cooperative. Patient was motivated to [facility]-up with [facility] [place] of the [place] that his father found for him.  During this hospitalization there was no need for seclusion, restraints or forced medications. Patient participated actively in programming. Patient was cooperative and had good interactions with  peers.   Discharge Vitals:   Blood pressure 120/84, pulse 94, temperature 98.1 F (36.7 C), temperature source Oral, resp. rate 20, height _0  (1.676 m), weight 58.968 kg (130 lb). Body mass index is 20.99 kg/(m^2).  Lab Results:    Results for MALIN, CERVINI (MRN [id number]) as of 08/24/2015 09:10  Ref. Range 07/27/2015 09:06 08/10/2015 08:38 08/10/2015 11:22 08/12/2015 17:19 08/17/2015 13:17 08/20/2015 13:29  Sodium Latest Ref Range: 135-145 mmol/L 139 141    140  Potassium Latest Ref Range: 3.5-5.1 mmol/L 3.2 (L) 3.6    4.1  Chloride Latest Ref Range: 101-111 mmol/L 105 107    104  CO2 Latest Ref Range: 22-32 mmol/L _1 BUN Latest Ref Range: 6-20 mg/dL _2 Creatinine Latest Ref Range: 0.61-1.24 mg/dL 1.04 1.01    1.00  Calcium Latest Ref Range: 8.9-10.3 mg/dL 9.4 9.2    9.1  EGFR (Non-African Amer.) Latest Ref Range: >60 mL/min >60 >60    >60  EGFR (African American) Latest Ref Range: >60 mL/min >60 >60    >60  Glucose Latest Ref Range: 65-99 mg/dL 104 (H) 146 (H)    104 (H)  Anion gap Latest Ref Range: 5-_3 Alkaline Phosphatase Latest Ref Range: 38-126 U/L 76 81    79  Albumin Latest Ref Range: 3.5-5.0 g/dL 4.6 4.5    4.6  AST Latest Ref Range: 15-41 U/L 25 22    37  ALT Latest Ref Range: 17-63 U/L 20 15 (L)    45  Total Protein Latest Ref Range: 6.5-8.1 g/dL 8.2 (H) 7.5    7.8  Total Bilirubin Latest Ref Range: 0.3-1.2 mg/dL 0.7 0.3    0.5  Cholesterol Latest Ref Range: 0-200 mg/dL    181    Triglycerides Latest Ref Range: <150 mg/dL    82    HDL Cholesterol Latest Ref Range: >40 mg/dL    65    LDL (calc) Latest Ref Range: 0-99 mg/dL    100 (H)    VLDL Latest Ref Range: 0-40 mg/dL    16    Total CHOL/HDL Ratio Latest Units: RATIO    2.8    WBC Latest Ref Range: 3.8-10.6 K/uL 8.6 6.9    7.7  RBC Latest Ref Range: 4.40-5.90 MIL/uL 5.38 4.97    4.93  Hemoglobin Latest Ref Range: 13.0-18.0 g/dL 17.3 15.9    16.0  HCT Latest Ref Range: 40.0-52.0 % 51.4  47.7    47.3  MCV Latest Ref Range: 80.0-100.0 fL 95.5 95.9    95.9  MCH Latest Ref Range: 26.0-34.0 pg 32.2 32.1    32.5  MCHC Latest Ref Range: 32.0-36.0 g/dL 33.7 33.5    33.8  RDW Latest Ref Range: 11.5-14.5 % 14.3 14.5    14.5  Platelets Latest Ref Range: 150-440 K/uL 241 212  102  Salicylate Lvl Latest Ref Range: 2.8-30.0 mg/dL  <4.0      Acetaminophen Latest Ref Range: 10-30 ug/mL  <10 (L)      Hemoglobin A1C Latest Ref Range: 4.0-6.0 %    5.0    TSH Latest Ref Range: 0.350-4.500 uIU/mL    0.767    Alcohol, Ethyl (B) Latest Ref Range: <5 mg/dL 6 (H) 13 (H)    7 (H)  Amphetamines, Ur Screen Latest Ref Range: NONE DETECTED  NONE DETECTED  NONE DETECTED   NONE DETECTED  Barbiturates, Ur Screen Latest Ref Range: NONE DETECTED  NONE DETECTED  NONE DETECTED   NONE DETECTED  Benzodiazepine, Ur Scrn Latest Ref Range: NONE DETECTED  NONE DETECTED  NONE DETECTED   POSITIVE (A)  Cocaine Metabolite,Ur [place] Latest Ref Range: NONE DETECTED  POSITIVE (A)  POSITIVE (A)   POSITIVE (A)  Methadone Scn, Ur Latest Ref Range: NONE DETECTED  NONE DETECTED  NONE DETECTED   NONE DETECTED  MDMA (Ecstasy)Ur Screen Latest Ref Range: NONE DETECTED  NONE DETECTED  NONE DETECTED   NONE DETECTED  Cannabinoid 50 Ng, Ur  Latest Ref Range: NONE DETECTED  POSITIVE (A)  POSITIVE (A)   POSITIVE (A)  Opiate, Ur Screen Latest Ref Range: NONE DETECTED  NONE DETECTED  NONE DETECTED   NONE DETECTED  Phencyclidine (PCP) Ur S Latest Ref Range: NONE DETECTED  NONE DETECTED  NONE DETECTED   NONE DETECTED  Tricyclic, Ur Screen Latest Ref Range: NONE DETECTED  NONE DETECTED  NONE DETECTED   NONE DETECTED    Physical Findings: AIMS: Facial and Oral Movements Muscles of Facial Expression: None, normal Lips and Perioral Area: None, normal Jaw: None, normal Tongue: None, normal,Extremity Movements Upper (arms, wrists, hands, fingers): None, normal Lower (legs, knees, ankles, toes): None, normal, Trunk Movements Neck, shoulders,  hips: None, normal, Overall Severity Severity of abnormal movements (highest score from questions above): None, normal Incapacitation due to abnormal movements: None, normal Patient's awareness of abnormal movements (rate only patient's report): No Awareness, Dental Status Current problems with teeth and/or dentures?: No Does patient usually wear dentures?: No  CIWA:  CIWA-Ar Total: 0 COWS:        Discharge Instructions    Diet - low sodium heart healthy    Complete by:  As directed      Increase activity slowly    Complete by:  As directed             Medication List    STOP taking these medications        QUEtiapine 100 MG tablet  Commonly known as:  SEROQUEL     QUEtiapine 400 MG tablet  Commonly known as:  SEROQUEL      TAKE these medications      Indication   hydrOXYzine 50 MG tablet  Commonly known as:  ATARAX/VISTARIL  Take 1 tablet (50 mg total) by mouth 3 (three) times daily.  Notes to Patient:  anxiety      sertraline 100 MG tablet  Commonly known as:  ZOLOFT  Take 1 tablet (100 mg total) by mouth daily.  Notes to Patient:  depression   Indication:  Major Depressive Disorder     traZODone 150 MG tablet  Commonly known as:  DESYREL  Take 1 tablet (150 mg total) by mouth at bedtime as needed for sleep.  Notes to Patient:  insomnia            Follow-up Information    Go  to [place].   Why:  Hospital Discharge Follow up walk in assessment appointment Mon-Wed-Friday 9-3pm Contact Sherrian Divers [phone]   Contact information:   [place] [phone]       Total Discharge Time: 30 minutes  Signed: Hildred Priest 08/24/2015, 9:10 AM

## 2015-08-24 NOTE — Progress Notes (Signed)
Patient with appropriate affect and cooperative behavior with meals, meds and plan of care. No SI/HI/AVH at this time. MD into assess and evaluate and patient to discharge today when discharge plan and transportation in place. Safety maintained.

## 2015-08-24 NOTE — [facility] Suicide Risk Assessment (Signed)
[facility] Discharge Suicide Risk Assessment   Demographic Factors:  Male, Caucasian, Low socioeconomic status, Living alone and Unemployed  Total Time spent with patient: 30 minutes   Psychiatric Specialty Exam: Physical Exam  ROS                                                         Have you used any form of tobacco in the last 30 days? (Cigarettes, Smokeless Tobacco, Cigars, and/or Pipes): Yes  Has this patient used any form of tobacco in the last 30 days? (Cigarettes, Smokeless Tobacco, Cigars, and/or Pipes) Yes, A prescription for an FDA-approved tobacco cessation medication was offered at discharge and the patient refused  Mental Status Per Nursing Assessment::   On Admission:  NA  Current Mental Status by Physician: Denies suicidality. Mood is euthymic and affect is bright. Patient is hopeful and future oriented. Plans to call to a [facility] substance abuse program  Loss Factors: Decrease in vocational status, Legal issues and Financial problems/change in socioeconomic status  Historical Factors: Impulsivity  Risk Reduction Factors:   Positive social support  Continued Clinical Symptoms:  Depression:   Comorbid alcohol abuse/dependence Impulsivity Alcohol/Substance Abuse/Dependencies Personality Disorders:   Cluster B Comorbid alcohol abuse/dependence Comorbid depression More than one psychiatric diagnosis Previous Psychiatric Diagnoses and Treatments  Cognitive Features That Contribute To Risk:  None    Suicide Risk:  Minimal: No identifiable suicidal ideation.  Patients presenting with no risk factors but with morbid ruminations; may be classified as minimal risk based on the severity of the depressive symptoms  Principal Problem: Major depressive disorder, recurrent episode, moderate with melancholic features Discharge Diagnoses:  Patient Active Problem List   Diagnosis Date Noted  . Alcohol withdrawal [F10.239] 08/23/2015  .  Tobacco use disorder [Z72.0] 08/23/2015  . Major depressive disorder, recurrent episode, moderate with melancholic features [F33.1] 08/23/2015  . 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 Cocaine use disorder, severe, dependence [F14.20] 08/11/2015  . Alcohol use disorder, severe, dependence [F10.20] 08/11/2015  . Cannabis use disorder, severe, dependence [F12.20] 08/11/2015    Follow-up Information    Go to [facility].   Why:  Hospital Discharge Follow up walk in assessment appointment Mon-Wed-Friday 9-3pm Contact Unk Pinto [phone]   Contact information:   [address] [place] [phone]      Is patient on multiple antipsychotic therapies at discharge:  No   Has Patient had three or more failed trials of antipsychotic monotherapy by history:  No  Recommended Plan for Multiple Antipsychotic Therapies: NA    Jimmy Footman 08/24/2015, 9:01 AM

## 2015-08-24 NOTE — Progress Notes (Signed)
LCSW met with patient early this morning and reviewed suicide prevention education. Patient will follow up with [facility] walk in clinic appointment times provided and medication management clinic application completed. Peer support Sherrian Divers  Number was provided to patient. AA resource list was also provided to patient and patient encouraged to attend and get a sponsor.  LCSW spoke with patients father who has arranged for his son to contact [facility] Evon Slack) [phone] who is awaiting the patient to call. This is a long term -spiritual based treatment shelter.  In meeting with patient he again refused Museum/gallery exhibitions officer, resource list from [facility] provided,suicide education handout,and patient has agreed to follow up with [facility] upon discharge. In discussion with the patient he reports he is not suicidal or homicidal or experiencing AH/VH.  Patient has transportation

## 2015-08-24 NOTE — Progress Notes (Signed)
  [facility] Adult Case Management Discharge Plan :  Will you be returning to the same living situation after discharge:  Yes,  Patient was provided shelter and rooming house information but refuses shelter lodging and decided to camp/stay in his truck At discharge, do you have transportation home?: Yes,  Patient has his own vehicle Do you have the ability to pay for your medications: No.Patient will access Medication Management  Release of information consent forms completed and in the chart;  Patient's signature needed at discharge.  Patient to Follow up at: Follow-up Information    Go to [facility].   Why:  Hospital Discharge Follow up walk in assessment appointment Mon-Wed-Friday 9-3pm Contact Unk Pinto [phone]   Contact information:   [address] [place] [phone]      Patient denies SI/HI: Yes,  Patient in addition was provided a suicide prevention booklet    Safety Planning and Suicide Prevention discussed: Yes,  Information provided  Have you used any form of tobacco in the last 30 days? (Cigarettes, Smokeless Tobacco, Cigars, and/or Pipes): Yes  Has patient been referred to the Quitline?: Patient refused referral  Arrie Senate M 08/24/2015, 7:42 AM

## 2015-08-24 NOTE — Plan of Care (Signed)
Problem: Ineffective individual coping Goal: LTG: Patient will report a decrease in negative feelings Outcome: Not Progressing Patient is anxious and hyper focused on medications for sleep. Able to be redirected, but states he does not feel good.  Goal: STG: Pt will be able to identify effective and ineffective STG: Pt will be able to identify effective and ineffective coping patterns  Outcome: Not Progressing Not progressing.  Goal: STG: Patient will remain free from self harm Outcome: Progressing No self harm.

## 2015-09-21 ENCOUNTER — Emergency Department
Admission: EM | Admit: 2015-09-21 | Discharge: 2015-09-21 | Disposition: A | Discharge status: Home / Self Care | Payer: Self-pay | Attending: Emergency Medicine | Admitting: Emergency Medicine

## 2015-09-21 ENCOUNTER — Encounter: Payer: Self-pay | Admitting: Emergency Medicine

## 2015-09-21 DIAGNOSIS — R369 Urethral discharge, unspecified: Secondary | ICD-10-CM | POA: Insufficient documentation

## 2015-09-21 DIAGNOSIS — T1491 Suicide attempt: Secondary | ICD-10-CM | POA: Insufficient documentation

## 2015-09-21 DIAGNOSIS — Z79899 Other long term (current) drug therapy: Secondary | ICD-10-CM | POA: Insufficient documentation

## 2015-09-21 DIAGNOSIS — Z720 Tobacco use: Secondary | ICD-10-CM | POA: Insufficient documentation

## 2015-09-21 LAB — URINALYSIS COMPLETE WITH MICROSCOPIC (ARMC ONLY)
BACTERIA UA: NONE SEEN
Bilirubin Urine: NEGATIVE
Glucose, UA: NEGATIVE mg/dL
Ketones, ur: NEGATIVE mg/dL
NITRITE: NEGATIVE
PROTEIN: 30 mg/dL — AB
SPECIFIC GRAVITY, URINE: 1.026 (ref 1.005–1.030)
SQUAMOUS EPITHELIAL / LPF: NONE SEEN
pH: 6 (ref 5.0–8.0)

## 2015-09-21 LAB — CHLAMYDIA/NGC RT PCR (ARMC ONLY)
CHLAMYDIA TR: NOT DETECTED
N gonorrhoeae: DETECTED — AB

## 2015-09-21 MED ORDER — IBUPROFEN 800 MG PO TABS
800.0000 mg | ORAL_TABLET | Freq: Once | ORAL | Status: AC
  Administered 2015-09-21: 800 mg via ORAL
  Filled 2015-09-21: qty 1

## 2015-09-21 MED ORDER — CYCLOBENZAPRINE HCL 10 MG PO TABS
10.0000 mg | ORAL_TABLET | Freq: Once | ORAL | Status: AC
  Administered 2015-09-21: 10 mg via ORAL
  Filled 2015-09-21: qty 1

## 2015-09-21 MED ORDER — CEFTRIAXONE SODIUM 250 MG IJ SOLR
250.0000 mg | Freq: Once | INTRAMUSCULAR | Status: AC
  Administered 2015-09-21: 250 mg via INTRAMUSCULAR
  Filled 2015-09-21: qty 250

## 2015-09-21 MED ORDER — AZITHROMYCIN 250 MG PO TABS
1000.0000 mg | ORAL_TABLET | Freq: Once | ORAL | Status: AC
  Administered 2015-09-21: 1000 mg via ORAL
  Filled 2015-09-21: qty 4

## 2015-09-21 NOTE — ED Notes (Signed)
States he popped his neck this am  And having pain with movement  Also having some penile discharge

## 2015-09-21 NOTE — ED Notes (Signed)
States he felt a pop to neck this am  Increased pain with turning his head.Also, he has additional complaints of . Penile discharge

## 2015-09-21 NOTE — ED Notes (Signed)
Pt will be d/c folllowing med hold. Pt made aware and verbalized understanding

## 2015-09-21 NOTE — ED Provider Notes (Signed)
[facility] Emergency Department Provider Note  ____________________________________________  Time seen: Approximately 11:29 AM  I have reviewed the triage vital signs and the nursing notes.   HISTORY  Chief Complaint Penile Discharge    HPI Carlos French is a 24 y.o. male patient complaining 2-3 days of urethral discharge and dysuria. Patient has unprotected sexual intercourse was 2 weeks ago. Patient denies any hematuria denies any flank pain. She also complaining of left lateral neck pain onset today. Patient states tenderness appropriately to right elbow Left Side of His Neck. Patient states since that abrupt turn of his neck he is having problems with flexion and right lateral movements. He denies any radicular component to this pain. No positive is taken for this pain. Patient described his neck pain is sharp and rates it as a 10 over 10.   Past Medical History  Diagnosis Date  . GI bleeding   . GERD (gastroesophageal reflux disease)   . Anxiety   . Depression   . PTSD (post-traumatic stress disorder)   . Bipolar 1 disorder   . Allergy   . GI (gastrointestinal bleed)     Patient Active Problem List   Diagnosis Date Noted  . Alcohol withdrawal 08/23/2015  . Tobacco use disorder 08/23/2015  . Major depressive disorder, recurrent episode, moderate with melancholic features 08/23/2015  . Borderline personality traits 08/23/2015  . Cocaine use disorder, severe, dependence 08/11/2015  . Alcohol use disorder, severe, dependence 08/11/2015  . Cannabis use disorder, severe, dependence 08/11/2015    History reviewed. No pertinent past surgical history.  Current Outpatient Rx  Name  Route  Sig  Dispense  Refill  . hydrOXYzine (ATARAX/VISTARIL) 50 MG tablet   Oral   Take 1 tablet (50 mg total) by mouth 3 (three) times daily.   15 tablet   0   . sertraline (ZOLOFT) 100 MG tablet   Oral   Take 1 tablet (100 mg total) by mouth daily.   7 tablet   0   . traZODone (DESYREL) 150 MG tablet   Oral   Take 1 tablet (150 mg total) by mouth at bedtime as needed for sleep.   7 tablet   0     Allergies Review of patient's allergies indicates no known allergies.  Family History  Problem Relation Age of Onset  . Stroke Mother     Social History Social History  Substance Use Topics  . Smoking status: Current Every Day Smoker -- 0.75 packs/day    Types: Cigarettes  . Smokeless tobacco: None  . Alcohol Use: Yes     Comment: 4 beer cans (24 oz) every day     Review of Systems Constitutional: No fever/chills Eyes: No visual changes. ENT: No sore throat. Cardiovascular: Denies chest pain. Respiratory: Denies shortness of breath. Gastrointestinal: No abdominal pain.  No nausea, no vomiting.  No diarrhea.  No constipation. Genitourinary: Positive for dysuria or urethral discharge.  Musculoskeletal: Left lateral neck pain. Skin: Negative for rash. Neurological: Negative for headaches, focal weakness or numbness. Psychiatric:Suicidal attempt 10-point ROS otherwise negative.  ____________________________________________   PHYSICAL EXAM:  VITAL SIGNS: ED Triage Vitals  Enc Vitals Group     BP 09/21/15 1053 125/84 mmHg     Pulse Rate 09/21/15 1053 83     Resp 09/21/15 1053 18     Temp 09/21/15 1053 98.4 F (36.9 C)     Temp Source 09/21/15 1053 Oral     SpO2 09/21/15 1053 98 %  Weight 09/21/15 1053 130 lb (58.968 kg)     Height 09/21/15 1053  (1.651 m)     Head Cir --      Peak Flow --      Pain Score 09/21/15 1054 10     Pain Loc --      Pain Edu? --      Excl. in GC? --     Constitutional: Alert and oriented. Well appearing and in no acute distress. Eyes: Conjunctivae are normal. PERRL. EOMI. Head: Atraumatic. Nose: No congestion/rhinnorhea. Mouth/Throat: Mucous membranes are moist.  Oropharynx non-erythematous. Neck: No stridor. No deformity. Decreased range of motion with flexion and right lateral  movements. Hematological/Lymphatic/Immunilogical: No cervical lymphadenopathy. Cardiovascular: Normal rate, regular rhythm. Grossly normal heart sounds.  Good peripheral circulation. Respiratory: Normal respiratory effort.  No retractions. Lungs CTAB. Gastrointestinal: Soft and nontender. No distention. No abdominal bruits. No CVA tenderness. Genitourinary: No penile lesion or visible discharge from the urethra. Musculoskeletal: No lower extremity tenderness nor edema.  No joint effusions. Neurologic:  Normal speech and language. No gross focal neurologic deficits are appreciated. No gait instability. Skin:  Skin is warm, dry and intact. No rash noted. Psychiatric: Mood and affect are normal. Speech and behavior are normal.  ____________________________________________   LABS (all labs ordered are listed, but only abnormal results are displayed)  Labs Reviewed  URINALYSIS COMPLETEWITH MICROSCOPIC ([facility] ONLY) - Abnormal; Notable for the following:    Color, Urine YELLOW (*)    APPearance CLEAR (*)    Hgb urine dipstick 1+ (*)    Protein, ur 30 (*)    Leukocytes, UA 2+ (*)    All other components within normal limits  CHLAMYDIA/NGC RT PCR [facility] ONLY)   called labs after 2 and half hours waiting for results and was told to test failed and he would have the rerun the GC chlamydia tests. I advised them I which they had notified us so we could discharge the patient based on the UA. ____________________________________________  EKG   ____________________________________________  RADIOLOGY   ____________________________________________   PROCEDURES  Procedure(s) performed: None  Critical Care performed: No  ____________________________________________   INITIAL IMPRESSION / ASSESSMENT AND PLAN / ED COURSE  Pertinent labs & imaging results that were available during my care of the patient were reviewed by me and considered in my medical decision making (see chart  for details).  Urethral discharge. Patient treated with Rocephin and Zithromax per protocol. Patient [name] sex partner of his condition and treatment. Advised to follow-up with their [place] health department. ____________________________________________   FINAL CLINICAL IMPRESSION(S) / ED DIAGNOSES  Final diagnoses:  Urethral discharge in male      Joni Reining, PA-C 09/21/15 1432  Emily Filbert, MD 09/21/15 [phone]

## 2015-10-16 ENCOUNTER — Emergency Department
Admission: EM | Admit: 2015-10-16 | Discharge: 2015-10-18 | Disposition: A | Discharge status: Other Acute Inpatient Hospital | Payer: No Typology Code available for payment source | Attending: Emergency Medicine | Admitting: Emergency Medicine

## 2015-10-16 DIAGNOSIS — F1910 Other psychoactive substance abuse, uncomplicated: Secondary | ICD-10-CM

## 2015-10-16 DIAGNOSIS — X838XXA Intentional self-harm by other specified means, initial encounter: Secondary | ICD-10-CM | POA: Insufficient documentation

## 2015-10-16 DIAGNOSIS — Y9289 Other specified places as the place of occurrence of the external cause: Secondary | ICD-10-CM | POA: Insufficient documentation

## 2015-10-16 DIAGNOSIS — F1210 Cannabis abuse, uncomplicated: Secondary | ICD-10-CM | POA: Insufficient documentation

## 2015-10-16 DIAGNOSIS — Y998 Other external cause status: Secondary | ICD-10-CM | POA: Insufficient documentation

## 2015-10-16 DIAGNOSIS — Y9389 Activity, other specified: Secondary | ICD-10-CM | POA: Insufficient documentation

## 2015-10-16 DIAGNOSIS — F329 Major depressive disorder, single episode, unspecified: Secondary | ICD-10-CM | POA: Insufficient documentation

## 2015-10-16 DIAGNOSIS — Z720 Tobacco use: Secondary | ICD-10-CM | POA: Insufficient documentation

## 2015-10-16 DIAGNOSIS — Z79899 Other long term (current) drug therapy: Secondary | ICD-10-CM | POA: Insufficient documentation

## 2015-10-16 DIAGNOSIS — S0512XA Contusion of eyeball and orbital tissues, left eye, initial encounter: Secondary | ICD-10-CM | POA: Insufficient documentation

## 2015-10-16 DIAGNOSIS — F17200 Nicotine dependence, unspecified, uncomplicated: Secondary | ICD-10-CM | POA: Diagnosis present

## 2015-10-16 DIAGNOSIS — F1220 Cannabis dependence, uncomplicated: Secondary | ICD-10-CM | POA: Diagnosis present

## 2015-10-16 LAB — COMPREHENSIVE METABOLIC PANEL
ALT: 31 U/L (ref 17–63)
AST: 26 U/L (ref 15–41)
Albumin: 5.2 g/dL — ABNORMAL HIGH (ref 3.5–5.0)
Alkaline Phosphatase: 68 U/L (ref 38–126)
Anion gap: 12 (ref 5–15)
BILIRUBIN TOTAL: 1.6 mg/dL — AB (ref 0.3–1.2)
BUN: 16 mg/dL (ref 6–20)
CHLORIDE: 99 mmol/L — AB (ref 101–111)
CO2: 26 mmol/L (ref 22–32)
CREATININE: 1.01 mg/dL (ref 0.61–1.24)
Calcium: 9.8 mg/dL (ref 8.9–10.3)
Glucose, Bld: 113 mg/dL — ABNORMAL HIGH (ref 65–99)
POTASSIUM: 3.7 mmol/L (ref 3.5–5.1)
Sodium: 137 mmol/L (ref 135–145)
TOTAL PROTEIN: 8.5 g/dL — AB (ref 6.5–8.1)

## 2015-10-16 LAB — URINE DRUG SCREEN, QUALITATIVE (ARMC ONLY)
Amphetamines, Ur Screen: NOT DETECTED
BENZODIAZEPINE, UR SCRN: NOT DETECTED
Barbiturates, Ur Screen: NOT DETECTED
CANNABINOID 50 NG, UR ~~LOC~~: POSITIVE — AB
Cocaine Metabolite,Ur ~~LOC~~: NOT DETECTED
MDMA (ECSTASY) UR SCREEN: NOT DETECTED
Methadone Scn, Ur: NOT DETECTED
Opiate, Ur Screen: NOT DETECTED
PHENCYCLIDINE (PCP) UR S: NOT DETECTED
TRICYCLIC, UR SCREEN: NOT DETECTED

## 2015-10-16 LAB — CBC
HCT: 51.3 % (ref 40.0–52.0)
Hemoglobin: 17.6 g/dL (ref 13.0–18.0)
MCH: 33.1 pg (ref 26.0–34.0)
MCHC: 34.3 g/dL (ref 32.0–36.0)
MCV: 96.5 fL (ref 80.0–100.0)
PLATELETS: 213 10*3/uL (ref 150–440)
RBC: 5.31 MIL/uL (ref 4.40–5.90)
RDW: 13.1 % (ref 11.5–14.5)
WBC: 8.8 10*3/uL (ref 3.8–10.6)

## 2015-10-16 LAB — SALICYLATE LEVEL

## 2015-10-16 LAB — ETHANOL: Alcohol, Ethyl (B): <5 mg/dL (ref <5)

## 2015-10-16 LAB — ACETAMINOPHEN LEVEL: Acetaminophen (Tylenol), Serum: <10 ug/mL — ABNORMAL LOW (ref 10–30)

## 2015-10-16 MED ORDER — LORAZEPAM 2 MG PO TABS
0.0000 mg | ORAL_TABLET | Freq: Two times a day (BID) | ORAL | Status: DC
  Filled 2015-10-16: qty 1

## 2015-10-16 MED ORDER — LORAZEPAM 2 MG/ML IJ SOLN: 0.0000 mg | Freq: Four times a day (QID) | INTRAMUSCULAR | Status: AC

## 2015-10-16 MED ORDER — LORAZEPAM 2 MG/ML IJ SOLN: 0.0000 mg | Freq: Two times a day (BID) | INTRAMUSCULAR | Status: AC

## 2015-10-16 MED ORDER — TRAZODONE HCL 100 MG PO TABS
100.0000 mg | ORAL_TABLET | Freq: Every day | ORAL | Status: DC
  Administered 2015-10-16: 100 mg via ORAL
  Filled 2015-10-16: qty 1

## 2015-10-16 MED ORDER — LORAZEPAM 2 MG PO TABS
0.0000 mg | ORAL_TABLET | Freq: Four times a day (QID) | ORAL | Status: AC
  Administered 2015-10-16: 2 mg via ORAL
  Administered 2015-10-17: 1 mg via ORAL
  Administered 2015-10-17: 2 mg via ORAL
  Filled 2015-10-16: qty 1

## 2015-10-16 MED ORDER — NICOTINE POLACRILEX 2 MG MT GUM
2.0000 mg | CHEWING_GUM | OROMUCOSAL | Status: DC | PRN
  Administered 2015-10-16 – 2015-10-18 (×6): 2 mg via ORAL
  Filled 2015-10-16 (×7): qty 1

## 2015-10-16 MED ORDER — QUETIAPINE FUMARATE ER 300 MG PO TB24
300.0000 mg | ORAL_TABLET | Freq: Every day | ORAL | Status: DC
Start: 2015-10-16 — End: 2015-10-18
  Administered 2015-10-16 – 2015-10-17 (×2): 300 mg via ORAL
  Filled 2015-10-16 (×4): qty 1

## 2015-10-16 MED ORDER — NICOTINE 10 MG IN INHA
1.0000 | RESPIRATORY_TRACT | Status: DC | PRN
  Filled 2015-10-16: qty 36

## 2015-10-16 MED ORDER — VITAMIN B-1 100 MG PO TABS
100.0000 mg | ORAL_TABLET | Freq: Every day | ORAL | Status: DC
  Administered 2015-10-16 – 2015-10-18 (×3): 100 mg via ORAL
  Filled 2015-10-16 (×3): qty 1

## 2015-10-16 MED ORDER — THIAMINE HCL 100 MG/ML IJ SOLN: 100.0000 mg | Freq: Every day | INTRAMUSCULAR | Status: DC

## 2015-10-16 NOTE — ED Notes (Signed)

## 2015-10-16 NOTE — ED Notes (Signed)
ED BHU PLACEMENT JUSTIFICATION  Is the patient under IVC or is there intent for IVC: Yes.  Is the patient medically cleared: Yes.  Is there vacancy in the ED BHU: Yes.  Is the population mix appropriate for patient: Yes.  Is the patient awaiting placement in inpatient or outpatient setting: Yes.  Has the patient had a psychiatric consult: Yes.  Survey of unit performed for contraband, proper placement and condition of furniture, tampering with fixtures in bathroom, shower, and each patient room: Yes. ; Findings: All clear  APPEARANCE/BEHAVIOR  calm, cooperative and adequate rapport can be established  NEURO ASSESSMENT  Orientation: time, place and person  Hallucinations: No.None noted (Hallucinations)  Speech: Normal  Gait: normal  RESPIRATORY ASSESSMENT  WNL  CARDIOVASCULAR ASSESSMENT  WNL  GASTROINTESTINAL ASSESSMENT  WNL  EXTREMITIES  WNL  PLAN OF CARE  Provide calm/safe environment. Vital signs assessed twice daily. ED BHU Assessment once each 12-hour shift. Collaborate with intake RN daily or as condition indicates. Assure the ED provider has rounded once each shift. Provide and encourage hygiene. Provide redirection as needed. Assess for escalating behavior; address immediately and inform ED provider.  Assess family dynamic and appropriateness for visitation as needed: Yes. ; If necessary, describe findings:  Educate the patient/family about BHU procedures/visitation: Yes. ; If necessary, describe findings: Pt is calm and cooperative at this time. Pt understanding and accepting of unit procedures/rules. Will continue to monitor.  
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
## 2015-10-16 NOTE — ED Notes (Signed)
Patient resting quietly in room. No noted distress or abnormal behaviors noted. Will continue 15 minute checks and observation by security camera for safety. CIWA = 1 Will continue to monitor for ETOH withdrawal per protocol. Maintain all safety precautions.

## 2015-10-16 NOTE — ED Notes (Signed)
BEHAVIORAL HEALTH ROUNDING  Patient sleeping: No.  Patient alert and oriented: yes  Behavior appropriate: Yes. ; If no, describe:  Nutrition and fluids offered: Yes  Toileting and hygiene offered: Yes  Sitter present: not applicable  Law enforcement present: Yes ODS  ENVIRONMENTAL ASSESSMENT  Potentially harmful objects out of patient reach: Yes.  Personal belongings secured: Yes.  Patient dressed in hospital provided attire only: Yes.  Plastic bags out of patient reach: Yes.  Patient care equipment (cords, cables, call bells, lines, and drains) shortened, removed, or accounted for: Yes.  Equipment and supplies removed from bottom of stretcher: Yes.  Potentially toxic materials out of patient reach: Yes.  Sharps container removed or out of patient reach: Yes.   
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
## 2015-10-16 NOTE — ED Notes (Signed)
BEHAVIORAL HEALTH ROUNDING  Patient sleeping: No.  Patient alert and oriented: yes  Behavior appropriate: Yes. ; If no, describe:  Nutrition and fluids offered: Yes  Toileting and hygiene offered: Yes  Sitter present: not applicable  Law enforcement present: Yes ODS  

## 2015-10-17 MED ORDER — QUETIAPINE FUMARATE 300 MG PO TABS
ORAL_TABLET | ORAL | Status: AC
  Filled 2015-10-17: qty 1

## 2015-10-17 MED ORDER — TRAZODONE HCL 50 MG PO TABS
150.0000 mg | ORAL_TABLET | Freq: Every day | ORAL | Status: DC
  Administered 2015-10-17: 150 mg via ORAL
  Filled 2015-10-17: qty 1

## 2015-10-17 MED ORDER — LORAZEPAM 1 MG PO TABS
ORAL_TABLET | ORAL | Status: AC
  Administered 2015-10-17: 1 mg via ORAL
  Filled 2015-10-17: qty 1

## 2015-10-17 NOTE — ED Notes (Signed)
BEHAVIORAL HEALTH ROUNDING  Patient sleeping: No.  Patient alert and oriented: yes  Behavior appropriate: Yes. ; If no, describe:  Nutrition and fluids offered: Yes  Toileting and hygiene offered: Yes  Sitter present: not applicable  Law enforcement present: Yes ODS  

## 2015-10-17 NOTE — ED Notes (Signed)
BEHAVIORAL HEALTH ROUNDING Patient sleeping: Yes.   Patient alert and oriented: not applicable SLEEPING Behavior appropriate: Yes.  ; If no, describe: SLEEPING Nutrition and fluids offered: No SLEEPING Toileting and hygiene offered: NoSLEEPING Sitter present: not applicable Law enforcement present: Yes ODS 

## 2015-10-17 NOTE — ED Notes (Signed)
Pt. Noted in room resting quietly;. No complaints or concerns voiced. No distress or abnormal behavior noted. Will continue to monitor with security cameras. Q 15 minute rounds continue. 

## 2015-10-17 NOTE — ED Notes (Signed)
Pt. Noted in  room watching the tv.;. No complaints or concerns voiced. No distress or abnormal behavior noted. Will continue to monitor with security cameras. Q 15 minute rounds continue. 

## 2015-10-17 NOTE — ED Notes (Signed)
Pt awakened for vs and meds. No issues at this time - asked for and received nicotine gum.

## 2015-10-17 NOTE — ED Notes (Signed)
Carlos French is IVC plans is he is to be Re-evaluation on 10/18/15 per Dr.Challa

## 2015-10-17 NOTE — ED Notes (Signed)
ENVIRONMENTAL ASSESSMENT  Potentially harmful objects out of patient reach: Yes.  Personal belongings secured: Yes.  Patient dressed in hospital provided attire only: Yes.  Plastic bags out of patient reach: Yes.  Patient care equipment (cords, cables, call bells, lines, and drains) shortened, removed, or accounted for: Yes.  Equipment and supplies removed from bottom of stretcher: Yes.  Potentially toxic materials out of patient reach: Yes.  Sharps container removed or out of patient reach: Yes.   BEHAVIORAL HEALTH ROUNDING Patient sleeping: Yes.   Patient alert and oriented: not applicable SLEEPING Behavior appropriate: Yes.  ; If no, describe: SLEEPING Nutrition and fluids offered: No SLEEPING Toileting and hygiene offered: NoSLEEPING Sitter present: not applicable Law enforcement present: Yes ODS 
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
## 2015-10-17 NOTE — ED Provider Notes (Signed)
-----------------------------------------   7:59 AM on 10/17/2015 -----------------------------------------   Blood pressure 135/69, pulse 98, temperature 98.1 F (36.7 C), temperature source Oral, resp. rate 19, weight 120 lb (54.432 kg), SpO2 97 %.  The patient had no acute events since last update.  Calm and cooperative at this time.  Disposition is pending per Psychiatry/Behavioral Medicine team recommendations.     Irean HongJade J Irby Fails, MD 10/17/15 0800

## 2015-10-18 DIAGNOSIS — F331 Major depressive disorder, recurrent, moderate: Secondary | ICD-10-CM

## 2015-10-18 NOTE — ED Notes (Signed)
BEHAVIORAL HEALTH ROUNDING Patient sleeping: No. Patient alert and oriented: yes Behavior appropriate: Yes.  ; If no, describe:  Nutrition and fluids offered: Yes  Toileting and hygiene offered: Yes  Sitter present: no Law enforcement present: Yes  
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
## 2015-10-18 NOTE — ED Provider Notes (Signed)
-----------------------------------------   6:14 AM on 10/18/2015 -----------------------------------------   Blood pressure 115/76, pulse 79, temperature 98.9 F (37.2 C), temperature source Oral, resp. rate 18, weight 120 lb (54.432 kg), SpO2 99 %.  The patient had no acute events since last update.  Calm and cooperative at this time.  Disposition is pending per Psychiatry/Behavioral Medicine team recommendations.     Irean HongJade J Jamyiah Labella, MD 10/18/15 [phone]

## 2015-10-18 NOTE — ED Notes (Signed)
Pt. Noted in room resting quietly;. No complaints or concerns voiced. No distress or abnormal behavior noted. Will continue to monitor with security cameras. Q 15 minute rounds continue. 

## 2015-10-18 NOTE — ED Notes (Signed)
MD at bedside. 

## 2015-10-18 NOTE — ED Notes (Signed)
Pt. Noted in room resting quietly Pt. Noted in room. No complaints or concerns voiced. No distress or abnormal behavior noted. Will continue to monitor with security cameras. Q 15 minute rounds continue.. No complaints or concerns voiced. No distress or abnormal behavior noted. Will continue to monitor with security cameras. Q 15 minute rounds continue.

## 2015-10-18 NOTE — ED Provider Notes (Signed)
-----------------------------------------   4:37 PM on 10/18/2015 -----------------------------------------  Patient has been seen and evaluated by psychiatry, they believe the patient is safe for discharge with [facility] follow-up. Labs are within normal limits. Medically clear. We'll discharge the patient with [facility] follow-up.  Minna AntisKevin Amairany Schumpert, MD 10/18/15 [phone]

## 2015-10-18 NOTE — Discharge Instructions (Signed)
You have been seen in the emergency department for substance abuse, and psychiatric concerns. Your medical workup has not shown any acute/concerning findings. Please follow-up with your outpatient psychiatric/substance abuse resources provided. Please return to the emergency department if you're having any thoughts of hurting yourself or anyone else so that we may attempt to help you. ° ° °Polysubstance Abuse °When people abuse more than one drug or type of drug it is called polysubstance or polydrug abuse. For example, many smokers also drink alcohol. This is one form of polydrug abuse. Polydrug abuse also refers to the use of a drug to counteract an unpleasant effect produced by another drug. It may also be used to help with withdrawal from another drug. People who take stimulants may become agitated. Sometimes this agitation is countered with a tranquilizer. This helps protect against the unpleasant side effects. Polydrug abuse also refers to the use of different drugs at the same time.  °Anytime drug use is interfering with normal living activities, it has become abuse. This includes problems with family and friends. Psychological dependence has developed when your mind tells you that the drug is needed. This is usually followed by physical dependence which has developed when continuing increases of drug are required to get the same feeling or "high". This is known as addiction or chemical dependency. A person's risk is much higher if there is a history of chemical dependency in the family. °SIGNS OF CHEMICAL DEPENDENCY °· You have been told by friends or family that drugs have become a problem. °· You fight when using drugs. °· You are having blackouts (not remembering what you do while using). °· You feel sick from using drugs but continue using. °· You lie about use or amounts of drugs (chemicals) used. °· You need chemicals to get you going. °· You are suffering in work performance or in school because of  drug use. °· You get sick from use of drugs but continue to use anyway. °· You need drugs to relate to people or feel comfortable in social situations. °· You use drugs to forget problems. °"Yes" answered to any of the above signs of chemical dependency indicates there are problems. The longer the use of drugs continues, the greater the problems will become. °If there is a family history of drug or alcohol use, it is best not to experiment with these drugs. Continual use leads to tolerance. After tolerance develops more of the drug is needed to get the same feeling. This is followed by addiction. With addiction, drugs become the most important part of life. It becomes more important to take drugs than participate in the other usual activities of life. This includes relating to friends and family. Addiction is followed by dependency. Dependency is a condition where drugs are now needed not just to get high, but to feel normal. °Addiction cannot be cured but it can be stopped. This often requires outside help and the care of professionals. Treatment centers are listed in the yellow pages under: Cocaine, Narcotics, and Alcoholics Anonymous. Most hospitals and clinics can refer you to a specialized care center. Talk to your caregiver if you need help. °  °This information is not intended to replace advice given to you by your health care provider. Make sure you discuss any questions you have with your health care provider. °  °Document Released: 08/02/2005 Document Revised: 03/04/2012 Document Reviewed: 12/16/2014 °Elsevier Interactive Patient Education ©2016 [employer] ° °

## 2015-10-18 NOTE — ED Notes (Signed)
BEHAVIORAL HEALTH ROUNDING Patient sleeping: Yes.   Patient alert and oriented: not applicable SLEEPING Behavior appropriate: Yes.  ; If no, describe: SLEEPING Nutrition and fluids offered: No SLEEPING Toileting and hygiene offered: NoSLEEPING Sitter present: not applicable Law enforcement present: Yes ODS 

## 2015-10-18 NOTE — ED Notes (Signed)
ED BHU PLACEMENT JUSTIFICATION Is the patient under IVC or is there intent for IVC: Yes.   Is the patient medically cleared: Yes.   Is there vacancy in the ED BHU: Yes.   Is the population mix appropriate for patient: Yes.   Is the patient awaiting placement in inpatient or outpatient setting: Yes.   Has the patient had a psychiatric consult: Yes.   Survey of unit performed for contraband, proper placement and condition of furniture, tampering with fixtures in bathroom, shower, and each patient room: Yes.  ; Findings:  APPEARANCE/BEHAVIOR calm and cooperative NEURO ASSESSMENT Orientation: time, place and person Hallucinations: No.None noted (Hallucinations) Speech: Normal Gait: normal RESPIRATORY ASSESSMENT Normal expansion.  Clear to auscultation.  No rales, rhonchi, or wheezing., No chest wall tenderness. CARDIOVASCULAR ASSESSMENT regular rate and rhythm, S1, S2 normal, no murmur, click, rub or gallop GASTROINTESTINAL ASSESSMENT soft, nontender, BS WNL, no r/g EXTREMITIES normal strength, tone, and muscle mass PLAN OF CARE Provide calm/safe environment. Vital signs assessed twice daily. ED BHU Assessment once each 12-hour shift. Collaborate with intake RN daily or as condition indicates. Assure the ED provider has rounded once each shift. Provide and encourage hygiene. Provide redirection as needed. Assess for escalating behavior; address immediately and inform ED provider.  Assess family dynamic and appropriateness for visitation as needed: Yes.  ; If necessary, describe findings:  Educate the patient/family about BHU procedures/visitation: Yes.  ; If necessary, describe findings:

## 2015-10-20 ENCOUNTER — Emergency Department
Admission: EM | Admit: 2015-10-20 | Discharge: 2015-10-20 | Disposition: A | Discharge status: Home / Self Care | Payer: Self-pay | Attending: Emergency Medicine | Admitting: Emergency Medicine

## 2015-10-20 DIAGNOSIS — F1210 Cannabis abuse, uncomplicated: Secondary | ICD-10-CM | POA: Insufficient documentation

## 2015-10-20 DIAGNOSIS — Z720 Tobacco use: Secondary | ICD-10-CM | POA: Insufficient documentation

## 2015-10-20 DIAGNOSIS — F1010 Alcohol abuse, uncomplicated: Secondary | ICD-10-CM | POA: Insufficient documentation

## 2015-10-20 DIAGNOSIS — Z79899 Other long term (current) drug therapy: Secondary | ICD-10-CM | POA: Insufficient documentation

## 2015-10-20 DIAGNOSIS — R42 Dizziness and giddiness: Secondary | ICD-10-CM

## 2015-10-20 LAB — URINE DRUG SCREEN, QUALITATIVE (ARMC ONLY)
AMPHETAMINES, UR SCREEN: NOT DETECTED
Barbiturates, Ur Screen: NOT DETECTED
Benzodiazepine, Ur Scrn: NOT DETECTED
COCAINE METABOLITE, UR ~~LOC~~: NOT DETECTED
Cannabinoid 50 Ng, Ur ~~LOC~~: POSITIVE — AB
MDMA (ECSTASY) UR SCREEN: NOT DETECTED
METHADONE SCREEN, URINE: NOT DETECTED
Opiate, Ur Screen: NOT DETECTED
Phencyclidine (PCP) Ur S: NOT DETECTED
TRICYCLIC, UR SCREEN: NOT DETECTED

## 2015-10-20 LAB — COMPREHENSIVE METABOLIC PANEL
ALT: 42 U/L (ref 17–63)
AST: 31 U/L (ref 15–41)
Albumin: 4.7 g/dL (ref 3.5–5.0)
Alkaline Phosphatase: 69 U/L (ref 38–126)
Anion gap: 8 (ref 5–15)
BILIRUBIN TOTAL: 1 mg/dL (ref 0.3–1.2)
BUN: 13 mg/dL (ref 6–20)
CALCIUM: 9.8 mg/dL (ref 8.9–10.3)
CO2: 27 mmol/L (ref 22–32)
CREATININE: 0.8 mg/dL (ref 0.61–1.24)
Chloride: 103 mmol/L (ref 101–111)
GFR calc non Af Amer: >60 mL/min (ref >60)
GLUCOSE: 98 mg/dL (ref 65–99)
POTASSIUM: 3.8 mmol/L (ref 3.5–5.1)
Sodium: 138 mmol/L (ref 135–145)
Total Protein: 7.9 g/dL (ref 6.5–8.1)

## 2015-10-20 LAB — URINALYSIS COMPLETE WITH MICROSCOPIC (ARMC ONLY)
BACTERIA UA: NONE SEEN
Bilirubin Urine: NEGATIVE
Glucose, UA: NEGATIVE mg/dL
HGB URINE DIPSTICK: NEGATIVE
KETONES UR: NEGATIVE mg/dL
LEUKOCYTES UA: NEGATIVE
NITRITE: NEGATIVE
PROTEIN: 100 mg/dL — AB
SPECIFIC GRAVITY, URINE: 1.02 (ref 1.005–1.030)
pH: 8 (ref 5.0–8.0)

## 2015-10-20 LAB — CBC WITH DIFFERENTIAL/PLATELET
BASOS PCT: 1 %
Basophils Absolute: 0.1 10*3/uL (ref 0–0.1)
EOS ABS: 0.1 10*3/uL (ref 0–0.7)
EOS PCT: 1 %
HEMATOCRIT: 48.6 % (ref 40.0–52.0)
Hemoglobin: 16.3 g/dL (ref 13.0–18.0)
Lymphocytes Relative: 16 %
Lymphs Abs: 1.1 10*3/uL (ref 1.0–3.6)
MCH: 32.7 pg (ref 26.0–34.0)
MCHC: 33.6 g/dL (ref 32.0–36.0)
MCV: 97.5 fL (ref 80.0–100.0)
MONO ABS: 0.7 10*3/uL (ref 0.2–1.0)
MONOS PCT: 10 %
Neutro Abs: 4.7 10*3/uL (ref 1.4–6.5)
Neutrophils Relative %: 72 %
Platelets: 197 10*3/uL (ref 150–440)
RBC: 4.98 MIL/uL (ref 4.40–5.90)
RDW: 13.2 % (ref 11.5–14.5)
WBC: 6.7 10*3/uL (ref 3.8–10.6)

## 2015-10-20 LAB — ETHANOL: Alcohol, Ethyl (B): <5 mg/dL (ref <5)

## 2015-10-20 MED ORDER — SODIUM CHLORIDE 0.9 % IV BOLUS (SEPSIS)
1000.0000 mL | Freq: Once | INTRAVENOUS | Status: AC
  Administered 2015-10-20: 1000 mL via INTRAVENOUS

## 2015-10-20 MED ORDER — FOLIC ACID 1 MG PO TABS
1.0000 mg | ORAL_TABLET | Freq: Once | ORAL | Status: AC
  Administered 2015-10-20: 1 mg via ORAL
  Filled 2015-10-20: qty 1

## 2015-10-20 MED ORDER — VITAMIN B-1 100 MG PO TABS
100.0000 mg | ORAL_TABLET | Freq: Once | ORAL | Status: AC
  Administered 2015-10-20: 100 mg via ORAL
  Filled 2015-10-20: qty 1

## 2015-10-20 MED ORDER — LORAZEPAM 2 MG/ML IJ SOLN
1.0000 mg | Freq: Once | INTRAMUSCULAR | Status: AC
  Administered 2015-10-20: 1 mg via INTRAVENOUS
  Filled 2015-10-20: qty 1

## 2015-10-20 NOTE — ED Notes (Signed)
Patient woke this morning and felt dizzy.  Patient had near syncopal episode in bathroom and fell, denies hitting head.

## 2015-10-20 NOTE — ED Provider Notes (Signed)
[facility] Emergency Department Provider Note  ____________________________________________   I have reviewed the triage vital signs and the nursing notes.   HISTORY  Chief Complaint Dizziness    HPI Carlos French R Henly is a 24 y.o. male states he drinks every day, he drank last yesterday morning several 40s. He has not had anything to drink since then in terms of alcohol. Does have a history of withdrawal. States that he only had a set of the same which since yesterday morning, over 30 hours. The patient states he felt very lightheaded this morning. Did not actually pass out did not hit his head. He denies closed head injury he denies narcotic abuse he is never used IV drugs, he used cocaine last 1 month ago. Never had a seizure.  Past Medical History  Diagnosis Date  . GI bleeding   . GERD (gastroesophageal reflux disease)   . Anxiety   . Depression   . PTSD (post-traumatic stress disorder)   . Bipolar 1 disorder (HCC)   . Allergy   . GI (gastrointestinal bleed)     Patient Active Problem List   Diagnosis Date Noted  . Alcohol withdrawal (HCC) 08/23/2015  . Tobacco use disorder 08/23/2015  . Major depressive disorder, recurrent episode, moderate with melancholic features (HCC) 08/23/2015  . Borderline personality traits 08/23/2015  . Cocaine use disorder, severe, dependence (HCC) 08/11/2015  . Alcohol use disorder, severe, dependence (HCC) 08/11/2015  . Cannabis use disorder, severe, dependence (HCC) 08/11/2015    History reviewed. No pertinent past surgical history.  Current Outpatient Rx  Name  Route  Sig  Dispense  Refill  . hydrOXYzine (ATARAX/VISTARIL) 50 MG tablet   Oral   Take 1 tablet (50 mg total) by mouth 3 (three) times daily. Patient not taking: Reported on 10/20/2015   15 tablet   0   . sertraline (ZOLOFT) 100 MG tablet   Oral   Take 1 tablet (100 mg total) by mouth daily. Patient not taking: Reported on 10/20/2015   7 tablet    0   . traZODone (DESYREL) 150 MG tablet   Oral   Take 1 tablet (150 mg total) by mouth at bedtime as needed for sleep. Patient not taking: Reported on 10/20/2015   7 tablet   0     Allergies Review of patient's allergies indicates no known allergies.  Family History  Problem Relation Age of Onset  . Stroke Mother     Social History Social History  Substance Use Topics  . Smoking status: Current Every Day Smoker -- 0.75 packs/day    Types: Cigarettes  . Smokeless tobacco: None  . Alcohol Use: Yes     Comment: 4 beer cans (24 oz) every day     Review of Systems Constitutional: No fever/chills Eyes: No visual changes. ENT: No sore throat. No stiff neck no neck pain Cardiovascular: Denies chest pain. Respiratory: Denies shortness of breath. Gastrointestinal:   no vomiting.  No diarrhea.  No constipation. Genitourinary: Negative for dysuria. Musculoskeletal: Negative lower extremity swelling Skin: Negative for rash. Neurological: Negative for headaches, focal weakness or numbness. 10-point ROS otherwise negative.  ____________________________________________   PHYSICAL EXAM:  VITAL SIGNS: ED Triage Vitals  Enc Vitals Group     BP 10/20/15 1016 129/53 mmHg     Pulse Rate 10/20/15 1016 99     Resp 10/20/15 1016 16     Temp 10/20/15 1016 98.6 F (37 C)     Temp Source 10/20/15 1016 Oral  SpO2 10/20/15 1016 97 %     Weight 10/20/15 1016 115 lb (52.164 kg)     Height 10/20/15 1016  (1.651 m)     Head Cir --      Peak Flow --      Pain Score --      Pain Loc --      Pain Edu? --      Excl. in GC? --     Constitutional: Alert and oriented. Well appearing and in no acute distress. Eyes: Conjunctivae are normal. PERRL. EOMI. Head: Atraumatic. Nose: No congestion/rhinnorhea. Mouth/Throat: Mucous membranes are moist.  Oropharynx non-erythematous. Neck: No stridor.   Nontender with no meningismus Cardiovascular: Normal rate, regular rhythm. Grossly  normal heart sounds.  Good peripheral circulation. Respiratory: Normal respiratory effort.  No retractions. Lungs CTAB. Gastrointestinal: Soft and nontender. No distention. No guarding no rebound Back:  There is no focal tenderness or step off there is no midline tenderness there are no lesions noted. there is no CVA tenderness Musculoskeletal: No lower extremity tenderness. No joint effusions, no DVT signs strong distal pulses no edema Neurologic:  Normal speech and language. No gross focal neurologic deficits are appreciated.  Skin:  Skin is warm, dry and intact. No rash noted. Psychiatric: Mood and affect are normal. Speech and behavior are normal.  ____________________________________________   LABS (all labs ordered are listed, but only abnormal results are displayed)  Labs Reviewed  URINE DRUG SCREEN, QUALITATIVE ([facility] ONLY)  URINALYSIS COMPLETEWITH MICROSCOPIC ([facility] ONLY)  ETHANOL  COMPREHENSIVE METABOLIC PANEL  CBC WITH DIFFERENTIAL/PLATELET   ____________________________________________  EKG   ____________________________________________  RADIOLOGY   ____________________________________________   PROCEDURES  Procedure(s) performed: None  Critical Care performed: None  ____________________________________________   INITIAL IMPRESSION / ASSESSMENT AND PLAN / ED COURSE  Pertinent labs & imaging results that were available during my care of the patient were reviewed by me and considered in my medical decision making (see chart for details).  patient is well-appearing, has had some nausea, felt lightheaded this morning, has not been eating or drinking, has been abusing alcohol. No SI or HI at this time. We will give him IV fluids and reassess after blood work.____________________________________________   FINAL CLINICAL IMPRESSION(S) / ED DIAGNOSES  Final diagnoses:  None     Jeanmarie Plant, MD 10/20/15 1133

## 2015-10-20 NOTE — Discharge Instructions (Signed)

## 2016-02-09 ENCOUNTER — Inpatient Hospital Stay
Admission: EM | Admit: 2016-02-09 | Discharge: 2016-02-15 | DRG: 897 | Disposition: A | Discharge status: Home / Self Care | Payer: No Typology Code available for payment source | Source: Intra-hospital | Attending: Psychiatry | Admitting: Psychiatry

## 2016-02-09 ENCOUNTER — Emergency Department: Payer: Self-pay

## 2016-02-09 ENCOUNTER — Emergency Department
Admission: EM | Admit: 2016-02-09 | Discharge: 2016-02-09 | Disposition: A | Discharge status: Other Acute Inpatient Hospital | Payer: No Typology Code available for payment source | Attending: Emergency Medicine | Admitting: Emergency Medicine

## 2016-02-09 ENCOUNTER — Encounter: Payer: Self-pay | Admitting: *Deleted

## 2016-02-09 DIAGNOSIS — F603 Borderline personality disorder: Secondary | ICD-10-CM

## 2016-02-09 DIAGNOSIS — F1994 Other psychoactive substance use, unspecified with psychoactive substance-induced mood disorder: Secondary | ICD-10-CM | POA: Diagnosis not present

## 2016-02-09 DIAGNOSIS — Z813 Family history of other psychoactive substance abuse and dependence: Secondary | ICD-10-CM | POA: Diagnosis not present

## 2016-02-09 DIAGNOSIS — F1924 Other psychoactive substance dependence with psychoactive substance-induced mood disorder: Secondary | ICD-10-CM | POA: Diagnosis present

## 2016-02-09 DIAGNOSIS — G4700 Insomnia, unspecified: Secondary | ICD-10-CM | POA: Diagnosis present

## 2016-02-09 DIAGNOSIS — F1220 Cannabis dependence, uncomplicated: Secondary | ICD-10-CM | POA: Diagnosis present

## 2016-02-09 DIAGNOSIS — F1420 Cocaine dependence, uncomplicated: Secondary | ICD-10-CM | POA: Diagnosis present

## 2016-02-09 DIAGNOSIS — Z811 Family history of alcohol abuse and dependence: Secondary | ICD-10-CM

## 2016-02-09 DIAGNOSIS — F17210 Nicotine dependence, cigarettes, uncomplicated: Secondary | ICD-10-CM | POA: Diagnosis present

## 2016-02-09 DIAGNOSIS — F1410 Cocaine abuse, uncomplicated: Secondary | ICD-10-CM | POA: Insufficient documentation

## 2016-02-09 DIAGNOSIS — Z823 Family history of stroke: Secondary | ICD-10-CM | POA: Diagnosis not present

## 2016-02-09 DIAGNOSIS — Z818 Family history of other mental and behavioral disorders: Secondary | ICD-10-CM

## 2016-02-09 DIAGNOSIS — F17200 Nicotine dependence, unspecified, uncomplicated: Secondary | ICD-10-CM | POA: Diagnosis present

## 2016-02-09 DIAGNOSIS — F3130 Bipolar disorder, current episode depressed, mild or moderate severity, unspecified: Secondary | ICD-10-CM | POA: Insufficient documentation

## 2016-02-09 DIAGNOSIS — Z915 Personal history of self-harm: Secondary | ICD-10-CM

## 2016-02-09 DIAGNOSIS — F10939 Alcohol use, unspecified with withdrawal, unspecified: Secondary | ICD-10-CM | POA: Diagnosis present

## 2016-02-09 DIAGNOSIS — R000 Tachycardia, unspecified: Secondary | ICD-10-CM | POA: Insufficient documentation

## 2016-02-09 DIAGNOSIS — F1020 Alcohol dependence, uncomplicated: Secondary | ICD-10-CM | POA: Diagnosis present

## 2016-02-09 DIAGNOSIS — F1310 Sedative, hypnotic or anxiolytic abuse, uncomplicated: Secondary | ICD-10-CM | POA: Insufficient documentation

## 2016-02-09 DIAGNOSIS — R079 Chest pain, unspecified: Secondary | ICD-10-CM | POA: Insufficient documentation

## 2016-02-09 DIAGNOSIS — F10239 Alcohol dependence with withdrawal, unspecified: Secondary | ICD-10-CM | POA: Diagnosis present

## 2016-02-09 DIAGNOSIS — R45851 Suicidal ideations: Secondary | ICD-10-CM | POA: Insufficient documentation

## 2016-02-09 DIAGNOSIS — R4587 Impulsiveness: Secondary | ICD-10-CM | POA: Diagnosis present

## 2016-02-09 DIAGNOSIS — K219 Gastro-esophageal reflux disease without esophagitis: Secondary | ICD-10-CM | POA: Diagnosis present

## 2016-02-09 DIAGNOSIS — F1210 Cannabis abuse, uncomplicated: Secondary | ICD-10-CM | POA: Insufficient documentation

## 2016-02-09 LAB — CBC
HEMATOCRIT: 49.9 % (ref 40.0–52.0)
HEMOGLOBIN: 17.2 g/dL (ref 13.0–18.0)
MCH: 32.9 pg (ref 26.0–34.0)
MCHC: 34.5 g/dL (ref 32.0–36.0)
MCV: 95.4 fL (ref 80.0–100.0)
PLATELETS: 248 10*3/uL (ref 150–440)
RBC: 5.23 MIL/uL (ref 4.40–5.90)
RDW: 13.1 % (ref 11.5–14.5)
WBC: 7.4 10*3/uL (ref 3.8–10.6)

## 2016-02-09 LAB — COMPREHENSIVE METABOLIC PANEL
ALK PHOS: 69 U/L (ref 38–126)
ALT: 64 U/L — AB (ref 17–63)
AST: 75 U/L — ABNORMAL HIGH (ref 15–41)
Albumin: 5.1 g/dL — ABNORMAL HIGH (ref 3.5–5.0)
Anion gap: 16 — ABNORMAL HIGH (ref 5–15)
BUN: 9 mg/dL (ref 6–20)
CO2: 21 mmol/L — AB (ref 22–32)
Calcium: 9.3 mg/dL (ref 8.9–10.3)
Chloride: 101 mmol/L (ref 101–111)
Creatinine, Ser: 0.92 mg/dL (ref 0.61–1.24)
GFR calc non Af Amer: >60 mL/min (ref >60)
GLUCOSE: 77 mg/dL (ref 65–99)
Potassium: 3.8 mmol/L (ref 3.5–5.1)
SODIUM: 138 mmol/L (ref 135–145)
Total Bilirubin: 1.4 mg/dL — ABNORMAL HIGH (ref 0.3–1.2)
Total Protein: 8.6 g/dL — ABNORMAL HIGH (ref 6.5–8.1)

## 2016-02-09 LAB — URINE DRUG SCREEN, QUALITATIVE (ARMC ONLY)
AMPHETAMINES, UR SCREEN: NOT DETECTED
Barbiturates, Ur Screen: NOT DETECTED
Benzodiazepine, Ur Scrn: POSITIVE — AB
Cannabinoid 50 Ng, Ur ~~LOC~~: POSITIVE — AB
Cocaine Metabolite,Ur ~~LOC~~: POSITIVE — AB
MDMA (ECSTASY) UR SCREEN: NOT DETECTED
Methadone Scn, Ur: NOT DETECTED
Opiate, Ur Screen: NOT DETECTED
PHENCYCLIDINE (PCP) UR S: NOT DETECTED
Tricyclic, Ur Screen: NOT DETECTED

## 2016-02-09 LAB — SALICYLATE LEVEL: Salicylate Lvl: <4 mg/dL (ref 2.8–30.0)

## 2016-02-09 LAB — ETHANOL: ALCOHOL ETHYL (B): 65 mg/dL — AB (ref <5)

## 2016-02-09 LAB — ACETAMINOPHEN LEVEL: Acetaminophen (Tylenol), Serum: <10 ug/mL — ABNORMAL LOW (ref 10–30)

## 2016-02-09 MED ORDER — TRAZODONE HCL 100 MG PO TABS
ORAL_TABLET | ORAL | Status: AC
  Administered 2016-02-09: 100 mg via ORAL
  Filled 2016-02-09: qty 1

## 2016-02-09 MED ORDER — PANTOPRAZOLE SODIUM 40 MG PO TBEC
40.0000 mg | DELAYED_RELEASE_TABLET | Freq: Every day | ORAL | Status: DC
  Administered 2016-02-09: 40 mg via ORAL
  Filled 2016-02-09: qty 1

## 2016-02-09 MED ORDER — ACETAMINOPHEN 325 MG PO TABS: 650.0000 mg | ORAL_TABLET | Freq: Four times a day (QID) | ORAL | Status: DC | PRN

## 2016-02-09 MED ORDER — MAGNESIUM HYDROXIDE 400 MG/5ML PO SUSP: 30.0000 mL | Freq: Every day | ORAL | Status: DC | PRN

## 2016-02-09 MED ORDER — ONDANSETRON 4 MG PO TBDP
4.0000 mg | ORAL_TABLET | Freq: Once | ORAL | Status: AC
  Administered 2016-02-09: 4 mg via ORAL
  Filled 2016-02-09: qty 1

## 2016-02-09 MED ORDER — ADULT MULTIVITAMIN W/MINERALS CH
1.0000 | ORAL_TABLET | Freq: Every day | ORAL | Status: DC
  Administered 2016-02-09: 1 via ORAL
  Filled 2016-02-09: qty 1

## 2016-02-09 MED ORDER — LORAZEPAM 1 MG PO TABS
1.0000 mg | ORAL_TABLET | Freq: Four times a day (QID) | ORAL | Status: DC | PRN
  Administered 2016-02-09: 1 mg via ORAL
  Filled 2016-02-09: qty 1

## 2016-02-09 MED ORDER — VITAMIN B-1 100 MG PO TABS: 100.0000 mg | ORAL_TABLET | Freq: Every day | ORAL | Status: DC

## 2016-02-09 MED ORDER — DIAZEPAM 5 MG PO TABS
5.0000 mg | ORAL_TABLET | Freq: Once | ORAL | Status: AC
  Administered 2016-02-09: 5 mg via ORAL
  Filled 2016-02-09: qty 1

## 2016-02-09 MED ORDER — FOLIC ACID 1 MG PO TABS: 1.0000 mg | ORAL_TABLET | Freq: Every day | ORAL | Status: DC

## 2016-02-09 MED ORDER — VITAMIN B-1 100 MG PO TABS
100.0000 mg | ORAL_TABLET | Freq: Every day | ORAL | Status: DC
  Administered 2016-02-09: 100 mg via ORAL
  Filled 2016-02-09: qty 1

## 2016-02-09 MED ORDER — FOLIC ACID 1 MG PO TABS
1.0000 mg | ORAL_TABLET | Freq: Every day | ORAL | Status: DC
  Administered 2016-02-09: 1 mg via ORAL
  Filled 2016-02-09: qty 1

## 2016-02-09 MED ORDER — LORAZEPAM 1 MG PO TABS: 1.0000 mg | ORAL_TABLET | Freq: Four times a day (QID) | ORAL | Status: DC | PRN

## 2016-02-09 MED ORDER — LORAZEPAM 2 MG/ML IJ SOLN: 1.0000 mg | Freq: Four times a day (QID) | INTRAMUSCULAR | Status: DC | PRN

## 2016-02-09 MED ORDER — TRAZODONE HCL 100 MG PO TABS
100.0000 mg | ORAL_TABLET | Freq: Every day | ORAL | Status: DC
  Administered 2016-02-09: 100 mg via ORAL

## 2016-02-09 MED ORDER — ALUM & MAG HYDROXIDE-SIMETH 200-200-20 MG/5ML PO SUSP
30.0000 mL | ORAL | Status: DC | PRN
  Administered 2016-02-10: 30 mL via ORAL
  Filled 2016-02-09: qty 30

## 2016-02-09 MED ORDER — THIAMINE HCL 100 MG/ML IJ SOLN: 100.0000 mg | Freq: Every day | INTRAMUSCULAR | Status: DC

## 2016-02-09 MED ORDER — ADULT MULTIVITAMIN W/MINERALS CH: 1.0000 | ORAL_TABLET | Freq: Every day | ORAL | Status: DC

## 2016-02-09 MED ORDER — PANTOPRAZOLE SODIUM 40 MG PO TBEC
40.0000 mg | DELAYED_RELEASE_TABLET | Freq: Every day | ORAL | Status: DC
  Administered 2016-02-10 – 2016-02-15 (×6): 40 mg via ORAL
  Filled 2016-02-09 (×7): qty 1

## 2016-02-09 NOTE — ED Notes (Signed)
NAD Noted at this time. Pt has maintained stable vital signs, per MD okay to remove from monitor at this time. Dr. Toni Amend removed 1:1 sitter. Will continue to monitor with 15 min safety rounds at this time.

## 2016-02-09 NOTE — ED Notes (Signed)
Report received from Tiburcio Bash., RN. Pt. Alert and oriented in no distress verbalizes having SI; denies having HI, AVH and c/o having stomach pain.  Pt. Instructed to come to me with problems or concerns.Will continue to monitor for safety via security cameras and Q 15 minute checks.

## 2016-02-09 NOTE — ED Notes (Signed)
Pt resting in bed. NAD noted. Pt continues to have a 1:1 sitter at this time. Will continue to monitor for changes in patient condition.

## 2016-02-09 NOTE — ED Notes (Signed)
Pt given Zofran after c/o nausea and noted dry heaving. Pt remains with 1:1 sitter and on the monitor. No change in patient condition at this time. Will continue to monitor with 1:1 sitter at this time.

## 2016-02-09 NOTE — ED Notes (Signed)
NAD noted at this time. Pt resting in bed. Pt denies needs at this time. Will continue to monitor with 1:1 sitter at this time.

## 2016-02-09 NOTE — ED Notes (Signed)
NAD noted at this time. Pt resting in bed with his eyes closed. Respirations noted to be even and unlabored at this time. Will continue to monitor with a 1:1 sitter.

## 2016-02-09 NOTE — ED Notes (Signed)
NAD noted at this time. Pt continues to be monitored by a 1:1 sitter at this time. Pt had 1 episode of active vomiting while with staff. Pt noted to be calm and cooperative, alert and oriented at this time.

## 2016-02-09 NOTE — ED Notes (Signed)
NAD noted at this time. Pt able to give urine specimen to staff at this time. Pt continues to have a 1:1 sitter. Pt calm and cooperative with staff at this time.
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
## 2016-02-09 NOTE — ED Notes (Signed)
Snack and beverage given. 
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
## 2016-02-09 NOTE — ED Notes (Signed)
NAD noted at this time. Pt remains on the monitor after c/o chest pain and SOB. 1:1 sitter remains at this time. Will continue to monitor with 1:1 sitter.

## 2016-02-09 NOTE — ED Notes (Signed)
Report given to Margaret, RN

## 2016-02-09 NOTE — Consult Note (Signed)
  Psychiatry: Follow-up note. Patient still awaiting appropriate disposition. He has not been violent threatening or attempting to harm himself in the emergency room. Discussed this with nurses and I would suggest we can discontinue the one-to-one sitter. Order completed.
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
## 2016-02-10 DIAGNOSIS — F1994 Other psychoactive substance use, unspecified with psychoactive substance-induced mood disorder: Secondary | ICD-10-CM

## 2016-02-10 DIAGNOSIS — F603 Borderline personality disorder: Secondary | ICD-10-CM

## 2016-02-10 LAB — LIPID PANEL
CHOL/HDL RATIO: 2.4 ratio
Cholesterol: 209 mg/dL — ABNORMAL HIGH (ref 0–200)
HDL: 86 mg/dL (ref >40)
LDL CALC: 99 mg/dL (ref 0–99)
Triglycerides: 120 mg/dL (ref <150)
VLDL: 24 mg/dL (ref 0–40)

## 2016-02-10 LAB — HEMOGLOBIN A1C: Hgb A1c MFr Bld: 4.6 % (ref 4.0–6.0)

## 2016-02-10 LAB — TSH: TSH: 2.032 u[IU]/mL (ref 0.350–4.500)

## 2016-02-10 MED ORDER — TRAZODONE HCL 50 MG PO TABS
150.0000 mg | ORAL_TABLET | Freq: Every evening | ORAL | Status: DC | PRN
  Administered 2016-02-10 – 2016-02-14 (×5): 150 mg via ORAL
  Filled 2016-02-10 (×5): qty 1

## 2016-02-10 MED ORDER — PNEUMOCOCCAL VAC POLYVALENT 25 MCG/0.5ML IJ INJ
0.5000 mL | INJECTION | INTRAMUSCULAR | Status: DC
  Filled 2016-02-10 (×3): qty 0.5

## 2016-02-10 MED ORDER — LOPERAMIDE HCL 2 MG PO CAPS: 4.0000 mg | ORAL_CAPSULE | ORAL | Status: DC | PRN

## 2016-02-10 MED ORDER — INFLUENZA VAC SPLIT QUAD 0.5 ML IM SUSY
0.5000 mL | PREFILLED_SYRINGE | INTRAMUSCULAR | Status: AC
  Administered 2016-02-11: 0.5 mL via INTRAMUSCULAR
  Filled 2016-02-10: qty 0.5

## 2016-02-10 MED ORDER — NICOTINE 21 MG/24HR TD PT24
21.0000 mg | MEDICATED_PATCH | Freq: Every day | TRANSDERMAL | Status: DC
  Administered 2016-02-10 – 2016-02-14 (×5): 21 mg via TRANSDERMAL
  Filled 2016-02-10 (×6): qty 1

## 2016-02-10 MED ORDER — CHLORDIAZEPOXIDE HCL 25 MG PO CAPS
50.0000 mg | ORAL_CAPSULE | Freq: Three times a day (TID) | ORAL | Status: DC
  Administered 2016-02-10 – 2016-02-11 (×4): 50 mg via ORAL
  Filled 2016-02-10 (×4): qty 2

## 2016-02-10 MED ORDER — HYDROCORTISONE 0.5 % EX CREA
TOPICAL_CREAM | Freq: Two times a day (BID) | CUTANEOUS | Status: DC
  Administered 2016-02-10 – 2016-02-11 (×2): 1 via TOPICAL
  Administered 2016-02-11 – 2016-02-12 (×2): via TOPICAL
  Administered 2016-02-13: 1 via TOPICAL
  Filled 2016-02-10: qty 28.35

## 2016-02-10 MED ORDER — TRAZODONE HCL 100 MG PO TABS: 100.0000 mg | ORAL_TABLET | Freq: Every evening | ORAL | Status: DC | PRN

## 2016-02-10 NOTE — B[facility] Group Notes (Signed)
[facility] Group Notes:  (Nursing/MHT/Case Management/Adjunct)  Date:  02/10/2016  Time:  2:40 PM  Type of Therapy:  Group Therapy  Participation Level:  Active  Participation Quality:  Appropriate, Attentive and Supportive  Affect:  Appropriate  Cognitive:  Alert, Appropriate and Oriented  Insight:  Appropriate  Engagement in Group:  Engaged  Modes of Intervention:  Activity  Summary of Progress/Problems:  Carlos French Carlos French 02/10/2016, 2:40 PM

## 2016-02-10 NOTE — Progress Notes (Signed)
Patient with depressed affect, tired and sleepy in bed this am. Patient cooperative with meals, meds and current plan of care. No SI/HI at this time. Detox protocol in place, c/o sweating and tiredness. Po fluids encouraged. Resting in bed safety maintained.
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
## 2016-02-10 NOTE — Tx Team (Signed)
Initial Interdisciplinary Treatment Plan   PATIENT STRESSORS: Financial difficulties Loss of Brother in 2009 Marital or family conflict   PATIENT STRENGTHS: Ability for insight Average or above average intelligence Motivation for treatment/growth   PROBLEM LIST: Problem List/Patient Goals Date to be addressed Date deferred Reason deferred Estimated date of resolution  Substance abuse-Primarily Alcohol 02/10/2016     Suicide Attempt 02/10/2016     Depression 02/10/2016     "Get into rehab" 02/10/2016                                    DISCHARGE CRITERIA:  Adequate post-discharge living arrangements Improved stabilization in mood, thinking, and/or behavior Motivation to continue treatment in a less acute level of care  PRELIMINARY DISCHARGE PLAN: Attend 12-step recovery group Outpatient therapy  PATIENT/FAMIILY INVOLVEMENT: This treatment plan has been presented to and reviewed with the patient, Carlos French.  The patient and family have been given the opportunity to ask questions and make suggestions.  Carlos French 02/10/2016, 3:26 AM

## 2016-02-10 NOTE — Progress Notes (Signed)
Recreation Therapy Notes  Date: 02.16.17 Time: 2:00 pm Location: Community Room  Group Topic: Coping Skills/Leisure Education  Goal Area(s) Addresses:  Patient will identify things they are grateful for. Patient will identify how being grateful can influence decision making.  Behavioral Response: Attentive  Intervention: Lexicographer  Activity: Patients were given an "I Am Grateful For" worksheet and instructed to write 2-3 things they were grateful for under each category.  Education: LRT educated patients on why it is important to think of things they are grateful for.  Education Outcome: In group clarification offered  Clinical Observations/Feedback: Patient completed activity by writing at least one item in each category. Patient left group at approximately 2:25 pm with PA student. Patient returned to group at approximately 3:40 pm. Patient did not contribute to group discussion.  Jacquelynn Cree, LRT/CTRS 02/10/2016 3:01 PM
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
## 2016-02-10 NOTE — [facility] Suicide Risk Assessment (Signed)
[facility] INPATIENT:  Family/Significant Other Suicide Prevention Education  Suicide Prevention Education:  Education Completed; with pt's Mr. Marti at ph: [phone],  (name of family member/significant other) has been identified by the patient as the family member/significant other with whom the patient will be residing, and identified as the person(s) who will aid the patient in the event of a mental health crisis (suicidal ideations/suicide attempt).  With written consent from the patient, the family member/significant other has been provided the following suicide prevention education, prior to the and/or following the discharge of the patient.  CSW completed SPE with pt.  The suicide prevention education provided includes the following:  Suicide risk factors  Suicide prevention and interventions  National Suicide Hotline telephone number  [facility] assessment telephone number  [facility] Emergency Assistance 911  [facility] and/or Residential Mobile Crisis Unit telephone number  Request made of family/significant other to:  Remove weapons (e.g., guns, rifles, knives), all items previously/currently identified as safety concern.    Remove drugs/medications (over-the-counter, prescriptions, illicit drugs), all items previously/currently identified as a safety concern.  The family member/significant other verbalizes understanding of the suicide prevention education information provided.  The family member/significant other agrees to remove the items of safety concern listed above.  [place] 02/10/2016, 12:23 PM

## 2016-02-10 NOTE — Plan of Care (Signed)
Problem: Diagnosis: Increased Risk For Suicide Attempt Goal: STG-Patient Will Report Suicidal Feelings to Staff Outcome: Progressing Pt reported suicidal thoughts of wanting to shoot self, but verbally contracted for safety
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
## 2016-02-10 NOTE — Progress Notes (Signed)
Initial Nutrition Assessment      INTERVENTION:  Meals and snacks: Cater to pt preferences Medical Nutrition Supplement Therapy: Will add mightyshake BID for added nutrition   NUTRITION DIAGNOSIS:   Inadequate oral intake related to social / environmental circumstances as evidenced by percent weight loss.    GOAL:   Patient will meet greater than or equal to 90% of their needs    MONITOR:    (Energy intake)  REASON FOR ASSESSMENT:   Malnutrition Screening Tool    ASSESSMENT:      Pt admitted with suicide attempt  Past Medical History  Diagnosis Date  . GI bleeding   . GERD (gastroesophageal reflux disease)   . Anxiety   . Depression   . PTSD (post-traumatic stress disorder)   . Bipolar 1 disorder (HCC)   . Allergy   . GI (gastrointestinal bleed)     Current Nutrition: eating 75-100% of meals per I and O sheet  Food/Nutrition-Related History: noted poor po intake prior to admission   Scheduled Medications:  . chlordiazePOXIDE  50 mg Oral TID  . hydrocortisone cream   Topical BID  . [START ON 02/11/2016] Influenza vac split quadrivalent PF  0.5 mL Intramuscular Tomorrow-1000  . nicotine  21 mg Transdermal Daily  . pantoprazole  40 mg Oral QAC breakfast  . [START ON 02/11/2016] pneumococcal 23 valent vaccine  0.5 mL Intramuscular Tomorrow-1000      Electrolyte/Renal Profile and Glucose Profile:   Recent Labs Lab 02/09/16 0955  NA 138  K 3.8  CL 101  CO2 21*  BUN 9  CREATININE 0.92  CALCIUM 9.3  GLUCOSE 77   Protein Profile:  Recent Labs Lab 02/09/16 0955  ALBUMIN 5.1*    Gastrointestinal Profile: Last BM: WDL     Weight Change: 17% wtt loss noted in the last 6 months    Diet Order:  Diet regular Room service appropriate?: No; Fluid consistency:: Thin    Height:   Ht Readings from Last 1 Encounters:  02/09/16  (1.651 m)    Weight:   Wt Readings from Last 1 Encounters:  02/09/16 112 lb (50.803 kg)    Ideal  Body Weight:     BMI:  Body mass index is 18.64 kg/(m^2).  Estimated Nutritional Needs:   Kcal:  (30-35 kcals/kg) 5409-8119 kcals/d.   Protein:  (.8-1.0 g/kg) 41-51 g/d  Fluid:  (30-93ml/d) 1530-1785 ml/d  EDUCATION NEEDS:   No education needs identified at this time  MODERATE Care Level  Sherrol Vicars B. Freida Busman, RD, LDN [phone] (pager) Weekend/On-Call pager [phone])

## 2016-02-10 NOTE — Plan of Care (Signed)
Problem: Consults Goal: [facility] Treatment Patient Education Outcome: Progressing Patient cooperative with plan of care. Detox protocol in effect.

## 2016-02-11 LAB — PROLACTIN: Prolactin: 32.3 ng/mL — ABNORMAL HIGH (ref 4.0–15.2)

## 2016-02-11 MED ORDER — CHLORDIAZEPOXIDE HCL 25 MG PO CAPS
25.0000 mg | ORAL_CAPSULE | Freq: Three times a day (TID) | ORAL | Status: DC
  Administered 2016-02-11 – 2016-02-15 (×16): 25 mg via ORAL
  Filled 2016-02-11 (×16): qty 1

## 2016-02-11 MED ORDER — TRAZODONE HCL 150 MG PO TABS
150.0000 mg | ORAL_TABLET | Freq: Every evening | ORAL | Status: DC | PRN
Start: 2016-02-11 — End: 2016-02-14

## 2016-02-11 MED ORDER — ACETAMINOPHEN 500 MG PO TABS
1000.0000 mg | ORAL_TABLET | Freq: Four times a day (QID) | ORAL | Status: DC | PRN
  Administered 2016-02-13 – 2016-02-14 (×3): 1000 mg via ORAL
  Filled 2016-02-11 (×4): qty 2

## 2016-02-11 MED ORDER — IBUPROFEN 800 MG PO TABS: 800.0000 mg | ORAL_TABLET | Freq: Three times a day (TID) | ORAL | Status: DC | PRN

## 2016-02-11 MED ORDER — PANTOPRAZOLE SODIUM 40 MG PO TBEC: 40.0000 mg | DELAYED_RELEASE_TABLET | Freq: Every day | ORAL | Status: DC

## 2016-02-11 MED ORDER — HALOPERIDOL 0.5 MG PO TABS
0.5000 mg | ORAL_TABLET | Freq: Four times a day (QID) | ORAL | Status: DC | PRN
  Administered 2016-02-11 – 2016-02-12 (×5): 0.5 mg via ORAL
  Filled 2016-02-11 (×5): qty 1

## 2016-02-11 MED ORDER — HYDROCORTISONE 0.5 % EX CREA: TOPICAL_CREAM | Freq: Two times a day (BID) | CUTANEOUS | Status: DC

## 2016-02-11 NOTE — Plan of Care (Signed)
Problem: Alteration in mood & ability to function due to Goal: LTG-Pt reports reduction in suicidal thoughts (Patient reports reduction in suicidal thoughts and is able to verbalize a safety plan for whenever patient is feeling suicidal)  Outcome: Not Progressing Continues to endorse SI.

## 2016-02-11 NOTE — Progress Notes (Signed)
Recreation Therapy Notes  Date: 02.17.17 Time: 3:00 pm Location: Community Room  Group Topic: Self-expression/Coping Skills  Goal Area(s) Addresses:  Patient will effectively use art as a Associate Professor. Patient will recognize positive benefit of coping skills. Patient will be able to identify one emotion experienced during group session. Patient will identify use of art as a coping skill.  Behavioral Response: Attentive, Interactive, Disrputive  Intervention: Two Faces of Me  Activity: Patients were given a blank face worksheet and instructed to draw or write how they felt when they were admitted into the hospital and draw or write how they want to feel when they are d/c.  Education:LRT educated patients on healthy coping skills.   Education Outcome: In group clarification offered  Clinical Observations/Feedback: Patient completed activity by drawing and writing how he felt when he was admitted and drawing and writing how he wanted to feel when he was d/c. Patient contributed to group discussion by stating how he felt when he was coloring. Patient had side conversation during group discussion. LRT redirected and patient complied.  Jacquelynn Cree, LRT/CTRS 02/11/2016 3:58 PM

## 2016-02-11 NOTE — [facility] Group Notes (Signed)
[facility] LCSW Aftercare Discharge Planning Group Note   02/11/2016 11:31 AM  Participation Quality:  Patient attended and participated in group discussion minimally introducing himself and sharing his SMART goal is to "go to rehab". Patient reports in group that he will kill himself if discharged. Patient will contract for safety but reports he will not be safe if he discharges without a good rehab program.  Mood/Affect:  Angry, Anxious and Depressed  Depression Rating:  10  Anxiety Rating:  10  Thoughts of Suicide:  Yes Will you contract for safety?   patient reports if he is discharged will kill himself but has no specific plan but will find a way. Patietn wants rehab   Current AVH:  No  Plan for Discharge/Comments:  Rehab, patient is homeless and will need follow up  Transportation Means:  Has a truck but believes his dad has taken it  Supports: no support, no family support, no housing  Lulu Riding, MSW, [facility] 02/11/2016

## 2016-02-11 NOTE — Plan of Care (Signed)
Problem: Ineffective individual coping Goal: LTG: Patient will report a decrease in negative feelings Outcome: Not Progressing Patient is irritable and anxious.

## 2016-02-11 NOTE — Progress Notes (Signed)
First am patient irritable and anxious.  Rates depression, anxiety and feeling of hopelessness as a 10.  Continues to endorse SI.  Stated goal is to go to rehab once discharged from here.  As day progressed notable improvement in mood.  Smiles upon approach and started to interact with peers.  Medication and group compliant.  Verbalizes that withdrawal symptoms are improving and also states that they are worse first thing in the morning.  Support and encouragement offered.  Safety maintained.

## 2016-02-11 NOTE — Progress Notes (Signed)
   02/11/16 1500  Clinical Encounter Type  Visited With Patient  Visit Type Initial;Spiritual support  Referral From Patient  Consult/Referral To Chaplain  Spiritual Encounters  Spiritual Needs Prayer;Emotional  Stress Factors  Patient Stress Factors Family relationships;Health changes;Major life changes;Loss of control  Visited w/Matteus per his request after spirituality group. Conducted life review. Discussed desires and means of spiritual strength. [place] expressed desire for renewed community, especially spiritual community with his home church group. We developed plan to develop mind, body, and spirit to affirm self-value and healthy relationship with God. We will meet Sunday to follow up on means for spiritual wellness. Chap. Shanyce Daris G. Maika Mcelveen, ext. [phone]

## 2016-02-11 NOTE — Plan of Care (Signed)
Problem: [facility] Participation in Recreation Therapeutic Interventions Goal: STG-Patient will demonstrate improved self esteem by identif STG: Self-Esteem - Within 4 treatment sessions, patient will verbalize at least 5 positive affirmation statements in each of 2 treatment sessions to increase self-esteem post d/c.  Outcome: Progressing Treatment Session 1; Completed 1 out of 2: At approximately 12:40 pm, LRT met with patient in consultation room. Patient verbalized 5 positive affirmation statements. Patient reported it felt "pretty good". LRT encouraged patient to continue saying positive affirmation statements. Intervention Used: I Am statements   M , LRT/CTRS 02.17.17 1:18 pm Goal: STG-Patient will identify at least five coping skills for ** STG: Coping Skills - Within 4 treatment sessions, patient will verbalize at least 5 coping skills for substance abuse in each of 2 treatment sessions to decrease substance abuse post d/c.  Outcome: Progressing Treatment Session 1; Completed 1 out of 2: At approximately 12:40 pm, LRT met with patient in consultation room. Patient verbalized 5 coping skills for substance abuse. LRT educated patient on leisure and why it is important to implement it into his schedule. LRT educated and provided him with blank schedules to help him plan his day and try to avoid using substances. LRT educated patient on healthy support systems. Intervention Used: Coping Skills worksheet   M , LRT/CTRS 02.17.17 1:22 pm     

## 2016-02-11 NOTE — Progress Notes (Signed)
D: Pt affect flat. Interaction appropriate. Noted to be socializing in dayroom. Calm and cooperative. No distress noted as detox protocol remains in place. Denies pain. Endorses SI without plan. Denies AVH. A: Encouragement and support provided. Q15 minute checks maintained for safety. Medications given as prescribed. R: Pt receptive to interventions. Appropriate with staff and peers. Remains safe on unit. Will continue to monitor.
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
## 2016-02-11 NOTE — [facility] Group Notes (Signed)
[facility] LCSW Group Therapy  02/11/2016 9:06 AM  Type of Therapy:  Group Therapy  Participation Level:  Minimal  Participation Quality:  Appropriate and Attentive  Affect:  Appropriate  Cognitive:  Alert, Appropriate and Oriented  Insight:  Engaged  Engagement in Therapy:  Engaged  Modes of Intervention:  Discussion, Socialization and Support  Summary of Progress/Problems: Patient attended and participated minimally in group introducing himself and participating in introductory exercise sharing his 'super power' is "fly and go far away from this place". Patient was attentive throughout group discussion and shared that he struggles with anger and wants to develop some coping skills.    Lulu Riding, MSW, LCSWA 02/11/2016, 9:06 AM

## 2016-02-11 NOTE — [facility] Group Notes (Signed)
[facility] Group Notes:  (Nursing/MHT/Case Management/Adjunct)  Date:  02/11/2016  Time:  4:39 PM  Type of Therapy:  Psychoeducational Skills  Participation Level:  Active  Participation Quality:  Appropriate, Attentive, Sharing and Supportive  Affect:  Flat  Cognitive:  Appropriate  Insight:  Appropriate  Engagement in Group:  Supportive  Modes of Intervention:  Discussion and Education  Summary of Progress/Problems:  Carlos French 02/11/2016, 4:39 PM
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
## 2016-02-12 NOTE — Progress Notes (Signed)
D: Pt endorses SI without plan. Denies AVH. Cooperative and pleasant. Cooperative with medications. Seen socializing in milieu. Denies pain. Appropriate with staff and peers. Voiced no additional concerns.  A: Medications given as prescribed. Q15 minute checks maintained for safety. Encouragement and support provided. R: Pt receptive to interventions. Cooperative and calm. Remains safe on unit.

## 2016-02-12 NOTE — Progress Notes (Signed)
D: patient irritable first am.  Upset and cussing because did not receive what he ordered for his breakfast. Cut meeting with doctor short.  Refused to talk to Child psychotherapist.  After breakfast returned to room and slept until lunch time.  When awakened stated that he felt tired and that he was soaking wet when he awakened and needed to was clothes.   A:  Patient medicated x 1 for agitation with good results.  Support and encouragement  offered. Discussed withdrawal symptoms with patient.  Allowed patient to verbalize his feelings.  Safety maintained. R:  Patient medication and group compliant.  Socializing with peers and staff appropriately later in the day.
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
## 2016-02-12 NOTE — Plan of Care (Signed)
Problem: Alteration in mood Goal: LTG-Patient reports reduction in suicidal thoughts (Patient reports reduction in suicidal thoughts and is able to verbalize a safety plan for whenever patient is feeling suicidal)  Outcome: Progressing Denies SI to this Clinical research associate.

## 2016-02-12 NOTE — [facility] Group Notes (Signed)
[facility] LCSW Group Therapy  02/12/2016 4:00 PM  Type of Therapy:  Group Therapy  Participation Level:  Active  Participation Quality:  Attentive  Affect:  Anxious  Cognitive:  Alert  Insight:  Limited  Engagement in Therapy:  Limited  Modes of Intervention:  Discussion, Education, Socialization and Support  Summary of Progress/Problems: Self esteem: Patients discussed self esteem and how it impacts them. They discussed what aspects in their lives has influenced their self esteem. They were challenged to identify changes that are needed in order to improve self esteem.  Jaloni reports he is having a rough day because he is having more withdraw symptoms. He reports feeling very tired, irritable and sweaty.   Quindarius Cabello L Permelia Bamba MSW, LCSWA  02/12/2016, 4:00 PM

## 2016-02-12 NOTE — Plan of Care (Signed)
Problem: Alteration in mood & ability to function due to Goal: LTG-Patient demonstrates decreased signs of withdrawal (Patient demonstrates decreased signs of withdrawal to the point the patient is safe to return home and continue treatment in an outpatient setting)  Outcome: Not Progressing Continues to have some withdrawal symptoms.  Irritability and sweating and slight tremors noted.

## 2016-02-13 MED ORDER — NALTREXONE HCL 50 MG PO TABS
25.0000 mg | ORAL_TABLET | Freq: Every day | ORAL | Status: DC
  Administered 2016-02-14 – 2016-02-15 (×2): 25 mg via ORAL
  Filled 2016-02-13 (×4): qty 1

## 2016-02-13 MED ORDER — HALOPERIDOL 0.5 MG PO TABS
0.5000 mg | ORAL_TABLET | Freq: Three times a day (TID) | ORAL | Status: DC
  Administered 2016-02-13 – 2016-02-15 (×7): 0.5 mg via ORAL
  Filled 2016-02-13 (×7): qty 1

## 2016-02-13 MED ORDER — ACAMPROSATE CALCIUM 333 MG PO TBEC
666.0000 mg | DELAYED_RELEASE_TABLET | Freq: Three times a day (TID) | ORAL | Status: DC
  Administered 2016-02-13 – 2016-02-15 (×5): 666 mg via ORAL
  Filled 2016-02-13 (×9): qty 2
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
## 2016-02-13 NOTE — Progress Notes (Signed)
Patient with appropriate affect, cooperative with meals, meds and plan of care. No SI/HI at this time. Patient with no s/s of withdrawal at this time. Social with peers. Encouraged to attend therapy groups to learn and initiate coping skills for management of stressors and diagnosis. Safety maintained.

## 2016-02-13 NOTE — Plan of Care (Signed)
Problem: Ineffective individual coping Goal: STG: Pt will be able to identify effective and ineffective STG: Pt will be able to identify effective and ineffective coping patterns  Outcome: Progressing Patient calm and cooperative, coping well.

## 2016-02-13 NOTE — [facility] Group Notes (Signed)
  02/13/2016 4:37 PM  Type of Therapy:  Group Therapy  Participation Level:  Active  Participation Quality:  Attentive  Affect:  Appropriate   Cognitive:  Alert  Insight:  Improving  Engagement in Therapy:  Improving  Modes of Intervention:  Activity, Discussion, Education, Socialization and Support  Summary of Progress/Problems: Pt will identify unhealthy thoughts and how they impact their emotions and behavior. Pt will be encouraged to discuss these thoughts, emotions and behaviors with the group. Pt attended group and stayed the entire time. He worked well with a team and was supportive of other patients.   Sentoria Brent L Kodie Pick MSW, LCSWA  02/13/2016, 4:37 PM  

## 2016-02-14 MED ORDER — PANTOPRAZOLE SODIUM 40 MG PO TBEC: 40.0000 mg | DELAYED_RELEASE_TABLET | Freq: Every day | ORAL | Status: DC

## 2016-02-14 MED ORDER — ACAMPROSATE CALCIUM 333 MG PO TBEC: 666.0000 mg | DELAYED_RELEASE_TABLET | Freq: Three times a day (TID) | ORAL | Status: DC

## 2016-02-14 MED ORDER — TRAZODONE HCL 150 MG PO TABS: 150.0000 mg | ORAL_TABLET | Freq: Every evening | ORAL | Status: DC | PRN

## 2016-02-14 MED ORDER — ACAMPROSATE CALCIUM 333 MG PO TBEC
666.0000 mg | DELAYED_RELEASE_TABLET | Freq: Three times a day (TID) | ORAL | Status: DC
Start: 2016-02-14 — End: 2016-02-14

## 2016-02-14 MED ORDER — NALTREXONE HCL 50 MG PO TABS: 25.0000 mg | ORAL_TABLET | Freq: Every day | ORAL | Status: DC

## 2016-02-14 MED ORDER — TETANUS-DIPHTH-ACELL PERTUSSIS 5-2.5-18.5 LF-MCG/0.5 IM SUSP
0.5000 mL | Freq: Once | INTRAMUSCULAR | Status: AC
  Administered 2016-02-14: 0.5 mL via INTRAMUSCULAR
  Filled 2016-02-14: qty 0.5
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
## 2016-02-14 NOTE — [facility] Group Notes (Signed)
[facility] LCSW Group Therapy  02/14/2016 2:18 PM  Type of Therapy:  Group Therapy  Participation Level:  Active  Participation Quality:  Appropriate and Attentive  Affect:  Appropriate  Cognitive:  Alert, Appropriate and Oriented  Insight:  Engaged  Engagement in Therapy:  Engaged  Modes of Intervention:  Discussion, Socialization and Support  Summary of Progress/Problems: Patient attended and participated in group discussion appropriately introducing himself and sharing his self-care activity is "music-playing my guitar." Patient shared that he likes to play heavy metal and this helps him to cope. Patient shared that an obstacle for him in an "Overcoming Obstacles" group discussion is limited income as his child's mother gets most of his paycheck after working 65+ hours weekly and he is only left with $150 . Patient was supportive of other group members during group.    Lulu Riding, MSW, LCSWA 02/14/2016, 2:18 PM

## 2016-02-14 NOTE — [facility] Group Notes (Signed)
[facility] Group Notes:  (Nursing/MHT/Case Management/Adjunct)  Date:  02/14/2016  Time:  2:52 PM  Type of Therapy:  Psychoeducational Skills  Participation Level:  None  Participation Quality:  Left early  Affect:  Irritable  Cognitive:  Appropriate  Insight:  Appropriate  Engagement in Group:  None  Modes of Intervention:  Discussion and Education  Summary of Progress/Problems:  Carlos French 02/14/2016, 2:52 PM

## 2016-02-14 NOTE — [facility] Suicide Risk Assessment (Signed)
[facility] Discharge Suicide Risk Assessment   Principal Problem: Substance induced mood disorder [facility]) Discharge Diagnoses:  Patient Active Problem List   Diagnosis Date Noted  . Borderline personality disorder [F60.3] 02/10/2016  . Substance induced mood disorder ([facility]) [F19.94] 02/09/2016  . Alcohol withdrawal ([facility]) [F10.239] 08/23/2015  . Tobacco use disorder [F17.200] 08/23/2015  . Cocaine use disorder, severe, dependence ([facility]) [F14.20] 08/11/2015  . Alcohol use disorder, severe, dependence ([facility]) [F10.20] 08/11/2015  . Cannabis use disorder, severe, dependence ([facility]) [F12.20] 08/11/2015     Psychiatric Specialty Exam: ROS                                                         Mental Status Per Nursing Assessment::   On Admission:  Suicidal ideation indicated by patient  Demographic Factors:  Male and Caucasian  Loss Factors: Financial problems/change in socioeconomic status  Historical Factors: Prior suicide attempts and Impulsivity  Risk Reduction Factors:   Employed and Positive social support  Continued Clinical Symptoms:  Alcohol/Substance Abuse/Dependencies Personality Disorders:   Cluster B Comorbid alcohol abuse/dependence Previous Psychiatric Diagnoses and Treatments  Cognitive Features That Contribute To Risk:  None    Suicide Risk:  Minimal: No identifiable suicidal ideation.  Patients presenting with no risk factors but with morbid ruminations; may be classified as minimal risk based on the severity of the depressive symptoms      Jimmy Footman, MD 02/15/2016, 9:33 AM

## 2016-02-14 NOTE — Progress Notes (Signed)
Patient sleepy in bed this am. No SI/HI at this time. Fair adls. Patient states that he would like to discharge today and that he has spoken to medical student already this am. No s/s of withdrawal. Social with select male peer. Sets firm limits with select male peer when she attempts to socialize. Takes meds, eats am meal and safety maintained at this time.

## 2016-02-14 NOTE — [facility] Group Notes (Signed)
[facility] LCSW Aftercare Discharge Planning Group Note   02/14/2016 10:42 AM  Participation Quality:  Patient attended group and participated appropriately introducing himself and sharing his SMART goal is to "go home, made plans over the weekend". Patient reports he is ready to discharge and his meds are stabilized and is ready to go. Patient feels hopeful about his discharge plan.  Mood/Affect:  Appropriate  Depression Rating:  0  Anxiety Rating:  0  Thoughts of Suicide:  No Will you contract for safety?   NA  Current AVH:  No  Plan for Discharge/Comments:  Discharge home with family and follow up with outpatient provider  Transportation Means: family  Supports: family support  Lulu Riding, MSW, [facility] 02/14/2016

## 2016-02-14 NOTE — Progress Notes (Signed)
D: Pt denied SI/HI AVH. Pt showed writer his leg from where he had punctured it with a pen and stated "That was stupid and impulsive but that lady is getting on my nerves"  Also stated that he was "peeing a lot this afternoon" A: Encouragement and support provided. Q15 minute checks maintained for safety. Medications given as prescribed. R: Pt Cooperative and calm. Appropriate with staff and peers. Remains safe on unit. Will continue to monitor.

## 2016-02-14 NOTE — Plan of Care (Signed)
Problem: [facility] Participation in Recreation Therapeutic Interventions Goal: STG-Patient will demonstrate improved self esteem by identif STG: Self-Esteem - Within 4 treatment sessions, patient will verbalize at least 5 positive affirmation statements in each of 2 treatment sessions to increase self-esteem post d/c.  Outcome: Completed/Met Date Met:  02/14/16 Treatment Session 2; Completed 2 out of 2: At approximately 12:15 pm, LRT met with patient in patient room. Patient verbalized 5 positive affirmation statements. Patient reported it felt "good". LRT encouraged patient to continue saying positive affirmation statements. Intervention Used: I Am statements  Leonette Monarch, LRT/[facility] 02.20.17 1:19 pm Goal: STG-Patient will identify at least five coping skills for ** STG: Coping Skills - Within 4 treatment sessions, patient will verbalize at least 5 coping skills for substance abuse in each of 2 treatment sessions to decrease substance abuse post d/c.  Outcome: Completed/Met Date Met:  02/14/16 Treatment Session 2; Completed 2 out of 2: At approximately 12:15 pm, LRT met with patient in patient room. Patient verbalized 6 coping skills for substance abuse. LRT encouraged patient to participate in leisure activities.  Intervention Used: Coping Skills worksheet  Leonette Monarch, LRT/[facility] 02.20.17 1:20 pm
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
## 2016-02-14 NOTE — Progress Notes (Signed)
Patient actively participating in plan of care. Tetnus shot IM administered as ordered. Patient social with peers. No distress, no complaint.

## 2016-02-14 NOTE — Progress Notes (Signed)
Recreation Therapy Notes  Date: 02.20.17 Time: 3:00 pm Location: Community Room  Group Topic: Self-expression  Goal Area(s) Addresses:  Patient will be able to identify a color that represents each emotion. Patient will verbalize benefit of using art as a means of self-expression. Patient will verbalize one positive emotion experienced while participating in activity.  Behavioral Response: Attentive, Interactive  Intervention: The Colors Within Me  Activity: Patients were given a blank face worksheet and instructed to pick a color for each emotion they were experiencing and show on the worksheet how much of that emotion they were experiencing.  Education: LRT educated patients on different forms of self-expression.  Education Outcome: In group clarification offered  Clinical Observations/Feedback: Patient completed activity by picking colors for each emotion he was experiencing and showing on his worksheet how much of the emotions he was experiencing. Patient contributed to group discussion by stating what emotions he felt while coloring.  Jacquelynn Cree, LRT/CTRS 02/14/2016 4:25 PM

## 2016-02-15 NOTE — Progress Notes (Signed)
D: Pt seen in milieu interacting appropriately with peers this evening. Denies SI/HI/AVH at this time. Pt c/o back pain rated a 7 out of 10 "from sitting on those hard chairs all day". PRN medication given upon request. Pt verbalizes a desire to "focus on getting on the wait list in order to go to a recovery house." No other concerns or complaints at this time. A: Emotional support and encouragement provided. Medications administered with education. q15 minute safety checks maintained. R: Pt remains free from harm.

## 2016-02-15 NOTE — Progress Notes (Signed)
  [facility] Adult Case Management Discharge Plan :  Will you be returning to the same living situation after discharge:  No.Says he will be living at a hotel. At discharge, do you have transportation home?: Yes,  Pt drove himself Do you have the ability to pay for your medications: No.Referral to [facility] made.  Advised Dr. Ardyth Harps of 2 medications, Naltraxone and Campral that they are unable to cover.  Release of information consent forms completed and in the chart;  Patient's signature needed at discharge.  Patient to Follow up at: Follow-up Information    Go to [facility].   Why:  Walk-ins Monday, Wednesday, Friday between 8am-3pm, for Hospital Follow up, Outpatient Medication Management, Therapy, Please take your photo ID   Contact information:   [address] [place] [address] [phone] FAX      Next level of care provider has access to [facility] Link:no  Safety Planning and Suicide Prevention discussed: Yes,     Have you used any form of tobacco in the last 30 days? (Cigarettes, Smokeless Tobacco, Cigars, and/or Pipes): Yes  Has patient been referred to the Quitline?: Patient refused referral  Patient has been referred for addiction treatment: Yes, referrals to [facility] and [facility] made on his behalf.  Glennon Mac, MSW, LCSW 02/15/2016, 4:22 PM

## 2016-02-15 NOTE — Plan of Care (Signed)
Problem: Diagnosis: Increased Risk For Suicide Attempt Goal: LTG-Patient Will Show Positive Response to Medication LTG (by discharge) : Patient will show positive response to medication and will participate in the development of the discharge plan.  Outcome: Progressing Denies suicidal ideations     

## 2016-02-15 NOTE — Plan of Care (Signed)
Problem: Ineffective individual coping Goal: STG: Patient will remain free from self harm Outcome: Progressing Pt remains free from harm  Problem: Alteration in mood & ability to function due to Goal: LTG-Patient demonstrates decreased signs of withdrawal (Patient demonstrates decreased signs of withdrawal to the point the patient is safe to return home and continue treatment in an outpatient setting)  Outcome: Progressing Pt does not show signs of withdrawal this evening Goal: STG-Patient will attend groups Outcome: Progressing Pt attends unit groups and verbalizes a desire to attend a recovery program after discharge

## 2016-02-15 NOTE — Progress Notes (Signed)
Recreation Therapy Notes  INPATIENT RECREATION TR PLAN  Patient Details Name: Carlos French MRN: [id number] DOB: [birth year] Today's Date: 02/15/2016  Rec Therapy Plan Is patient appropriate for Therapeutic Recreation?: Yes Treatment times per week: At least once a week TR Treatment/Interventions: 1:1 session, Group participation (Comment) (Appropriate participation in daily recreation therapy tx)  Discharge Criteria Pt will be discharged from therapy if:: Treatment goals are met, Discharged Treatment plan/goals/alternatives discussed and agreed upon by:: Patient/family  Discharge Summary Short term goals set: See Care Plan Short term goals met: Complete Progress toward goals comments: One-to-one attended Which groups?: Coping skills, Other (Comment), Leisure education (Self-expression) One-to-one attended: Self-esteem, coping skills Reason goals not met: N/A Therapeutic equipment acquired: None Reason patient discharged from therapy: Discharge from hospital Pt/family agrees with progress & goals achieved: Yes Date patient discharged from therapy: 02/15/16   Leonette Monarch, LRT/CTRS 02/15/2016, 12:04 PM

## 2016-02-15 NOTE — Progress Notes (Addendum)
D:Patient aware of discharge this shift . Patient returning home . Patient received all belonging locked up . Patient denies  Suicidal  And homicidal ideations  .  A: Writer instructed on discharge criteria  . Informed of 7 day supply  Of medication received  prescriptions   Aware  Of follow up appointment . R: Patient left unit with no questions drove self home

## 2016-02-15 NOTE — [facility] Group Notes (Signed)
[facility] Group Notes:  (Nursing/MHT/Case Management/Adjunct)  Date:  02/15/2016  Time:  2:28 AM  Type of Therapy:  Group Therapy  Participation Level:  Active  Participation Quality:  Appropriate  Affect:  Appropriate  Cognitive:  Appropriate  Insight:  Appropriate  Engagement in Group:  Engaged  Modes of Intervention:  Discussion  Summary of Progress/Problems:  Carlos French 02/15/2016, 2:28 AM

## 2016-03-06 ENCOUNTER — Ambulatory Visit: Payer: No Typology Code available for payment source

## 2016-03-25 ENCOUNTER — Emergency Department: Payer: Self-pay

## 2016-03-25 ENCOUNTER — Emergency Department
Admission: EM | Admit: 2016-03-25 | Discharge: 2016-03-25 | Disposition: A | Discharge status: Home / Self Care | Payer: Self-pay | Attending: Student | Admitting: Student

## 2016-03-25 ENCOUNTER — Other Ambulatory Visit: Payer: Self-pay

## 2016-03-25 DIAGNOSIS — Z79899 Other long term (current) drug therapy: Secondary | ICD-10-CM | POA: Insufficient documentation

## 2016-03-25 DIAGNOSIS — R079 Chest pain, unspecified: Secondary | ICD-10-CM | POA: Insufficient documentation

## 2016-03-25 DIAGNOSIS — F17210 Nicotine dependence, cigarettes, uncomplicated: Secondary | ICD-10-CM | POA: Insufficient documentation

## 2016-03-25 DIAGNOSIS — F1410 Cocaine abuse, uncomplicated: Secondary | ICD-10-CM | POA: Insufficient documentation

## 2016-03-25 DIAGNOSIS — F1210 Cannabis abuse, uncomplicated: Secondary | ICD-10-CM | POA: Insufficient documentation

## 2016-03-25 DIAGNOSIS — Z7952 Long term (current) use of systemic steroids: Secondary | ICD-10-CM | POA: Insufficient documentation

## 2016-03-25 DIAGNOSIS — J069 Acute upper respiratory infection, unspecified: Secondary | ICD-10-CM | POA: Insufficient documentation

## 2016-03-25 LAB — URINE DRUG SCREEN, QUALITATIVE (ARMC ONLY)
Amphetamines, Ur Screen: NOT DETECTED
BARBITURATES, UR SCREEN: NOT DETECTED
BENZODIAZEPINE, UR SCRN: NOT DETECTED
CANNABINOID 50 NG, UR ~~LOC~~: POSITIVE — AB
COCAINE METABOLITE, UR ~~LOC~~: POSITIVE — AB
MDMA (Ecstasy)Ur Screen: NOT DETECTED
Methadone Scn, Ur: NOT DETECTED
OPIATE, UR SCREEN: NOT DETECTED
PHENCYCLIDINE (PCP) UR S: NOT DETECTED
Tricyclic, Ur Screen: NOT DETECTED

## 2016-03-25 LAB — BASIC METABOLIC PANEL
ANION GAP: 6 (ref 5–15)
BUN: 11 mg/dL (ref 6–20)
CALCIUM: 9.4 mg/dL (ref 8.9–10.3)
CO2: 26 mmol/L (ref 22–32)
CREATININE: 0.98 mg/dL (ref 0.61–1.24)
Chloride: 104 mmol/L (ref 101–111)
GFR calc Af Amer: >60 mL/min (ref >60)
GLUCOSE: 104 mg/dL — AB (ref 65–99)
Potassium: 3.6 mmol/L (ref 3.5–5.1)
Sodium: 136 mmol/L (ref 135–145)

## 2016-03-25 LAB — CBC
HCT: 47.1 % (ref 40.0–52.0)
HEMOGLOBIN: 16.3 g/dL (ref 13.0–18.0)
MCH: 33.4 pg (ref 26.0–34.0)
MCHC: 34.5 g/dL (ref 32.0–36.0)
MCV: 96.8 fL (ref 80.0–100.0)
PLATELETS: 232 10*3/uL (ref 150–440)
RBC: 4.87 MIL/uL (ref 4.40–5.90)
RDW: 13.1 % (ref 11.5–14.5)
WBC: 7 10*3/uL (ref 3.8–10.6)

## 2016-03-25 LAB — RAPID INFLUENZA A&B ANTIGENS: Influenza B (ARMC): NEGATIVE

## 2016-03-25 LAB — RAPID INFLUENZA A&B ANTIGENS (ARMC ONLY): INFLUENZA A (ARMC): NEGATIVE

## 2016-03-25 LAB — TROPONIN I

## 2016-03-25 MED ORDER — IBUPROFEN 600 MG PO TABS
600.0000 mg | ORAL_TABLET | Freq: Once | ORAL | Status: AC
  Administered 2016-03-25: 600 mg via ORAL
  Filled 2016-03-25: qty 1

## 2016-03-25 NOTE — ED Provider Notes (Signed)
[facility] Emergency Department Provider Note  ____________________________________________  Time seen: Approximately 12:34 PM  I have reviewed the triage vital signs and the nursing notes.   HISTORY  Chief Complaint Chest Pain and URI    HPI Carlos French is a 25 y.o. male with history of bipolar disorder, PTSD, polysubstance abuse, history of GI bleed who presents for evaluation of constant nonexertional, nonradiating, nonpleuritic chest pain and shortness of breath today, gradual onset, constant since onset, improves when he gets up and moves around. Patient reports that since he awoke from sleep this morning he has had soreness behind the pectoralis muscles bilaterally. He also feels short of breath. He also feels as if his heart is "stops for a second and then it starts again ". He has had cough and nasal congestion since yesterday. He denies fevers but he has had chills today. No abdominal pain, no diarrhea. No personal or family history of early coronary artery disease. No personal or family history of PE or DVT. He denies any leg swelling, hemoptysis, exogenous estrogen use, recent surgeries or recent prolonged period of immobilization. He does reports he drank alcohol heavily and smokes marijuana daily, used cocaine approximately a week ago.   Past Medical History  Diagnosis Date  . GI bleeding   . GERD (gastroesophageal reflux disease)   . Anxiety   . Depression   . PTSD (post-traumatic stress disorder)   . Bipolar 1 disorder (HCC)   . Allergy   . GI (gastrointestinal bleed)     Patient Active Problem List   Diagnosis Date Noted  . Borderline personality disorder 02/10/2016  . Substance induced mood disorder (HCC) 02/09/2016  . Alcohol withdrawal (HCC) 08/23/2015  . Tobacco use disorder 08/23/2015  . Cocaine use disorder, severe, dependence (HCC) 08/11/2015  . Alcohol use disorder, severe, dependence (HCC) 08/11/2015  . Cannabis use disorder,  severe, dependence (HCC) 08/11/2015    No past surgical history on file.  Current Outpatient Rx  Name  Route  Sig  Dispense  Refill  . acamprosate (CAMPRAL) 333 MG tablet   Oral   Take 2 tablets (666 mg total) by mouth 3 (three) times daily with meals.   42 tablet   0   . hydrocortisone cream 0.5 %   Topical   Apply topically 2 (two) times daily.   30 g   0   . naltrexone (DEPADE) 50 MG tablet   Oral   Take 0.5 tablets (25 mg total) by mouth daily.   7 tablet   0   . pantoprazole (PROTONIX) 40 MG tablet   Oral   Take 1 tablet (40 mg total) by mouth daily before breakfast.   7 tablet   0   . traZODone (DESYREL) 150 MG tablet   Oral   Take 1 tablet (150 mg total) by mouth at bedtime as needed for sleep.   7 tablet   0     Allergies Review of patient's allergies indicates no known allergies.  Family History  Problem Relation Age of Onset  . Stroke Mother     Social History Social History  Substance Use Topics  . Smoking status: Current Every Day Smoker -- 0.75 packs/day    Types: Cigarettes  . Smokeless tobacco: Not on file  . Alcohol Use: Yes     Comment: 4 beer cans (24 oz) every day     Review of Systems Constitutional: No fever/chills Eyes: No visual changes. ENT: No sore throat. Cardiovascular: +  chest pain. Respiratory: + shortness of breath. Gastrointestinal: No abdominal pain.  No nausea, no vomiting.  No diarrhea.  No constipation. Genitourinary: Negative for dysuria. Musculoskeletal: Negative for back pain. Skin: Negative for rash. Neurological: Negative for headaches, focal weakness or numbness.  10-point ROS otherwise negative.  ____________________________________________   PHYSICAL EXAM:  VITAL SIGNS: ED Triage Vitals  Enc Vitals Group     BP 03/25/16 1058 133/78 mmHg     Pulse Rate 03/25/16 1058 72     Resp 03/25/16 1058 20     Temp 03/25/16 1058 98.2 F (36.8 C)     Temp Source 03/25/16 1058 Oral     SpO2 03/25/16 1058  100 %     Weight 03/25/16 1058 120 lb (54.432 kg)     Height 03/25/16 1058  (1.676 m)     Head Cir --      Peak Flow --      Pain Score 03/25/16 1102 7     Pain Loc --      Pain Edu? --      Excl. in GC? --     Constitutional: Alert and oriented. Well appearing and in no acute distress. Eyes: Conjunctivae are normal. PERRL. EOMI. Head: Atraumatic. Nose: + nasal congestion. Mouth/Throat: Mucous membranes are moist.  Oropharynx non-erythematous. Neck: No stridor. Supple without meningismus. Cardiovascular: Normal rate, regular rhythm. Grossly normal heart sounds.  Good peripheral circulation. Respiratory: Normal respiratory effort.  No retractions. Lungs CTAB. Gastrointestinal: Soft and nontender. No distention.  No CVA tenderness. Genitourinary: deferred Musculoskeletal: No lower extremity tenderness nor edema.  No joint effusions. No calf swelling, asymmetry or tenderness. Neurologic:  Normal speech and language. No gross focal neurologic deficits are appreciated. No gait instability. Skin:  Skin is warm, dry and intact. No rash noted. Psychiatric: Mood and affect are normal. Speech and behavior are normal.  ____________________________________________   LABS (all labs ordered are listed, but only abnormal results are displayed)  Labs Reviewed  BASIC METABOLIC PANEL - Abnormal; Notable for the following:    Glucose, Bld 104 (*)    All other components within normal limits  URINE DRUG SCREEN, QUALITATIVE ([facility] ONLY) - Abnormal; Notable for the following:    Cocaine Metabolite,Ur [place] POSITIVE (*)    Cannabinoid 50 Ng, Ur [place] POSITIVE (*)    All other components within normal limits  RAPID INFLUENZA A&B ANTIGENS ([facility] ONLY)  CBC  TROPONIN I   ____________________________________________  EKG  ED ECG REPORT I, Gayla Doss, the attending physician, personally viewed and interpreted this ECG.   Date: 03/25/2016  EKG Time: 11:09  Rate: 71  Rhythm: normal sinus  rhythm with marked sinus arrhythmia  Axis: normal  Intervals:right bundle branch block, incomplete  ST&T Change: No acute ST elevation. RSR prime in V1, V2 is chronic/unchanged from prior.  ____________________________________________  RADIOLOGY  CXR IMPRESSION: No active cardiopulmonary disease. ____________________________________________   PROCEDURES  Procedure(s) performed: None  Critical Care performed: No  ____________________________________________   INITIAL IMPRESSION / ASSESSMENT AND PLAN / ED COURSE  Pertinent labs & imaging results that were available during my care of the patient were reviewed by me and considered in my medical decision making (see chart for details).  Carlos French is a 25 y.o. male with history of bipolar disorder, PTSD, polysubstance abuse, history of GI bleed who presents for evaluation of constant chest pain and shortness of breath today as well as nasal congestion and chills. On exam he is very well-appearing  and in no acute distress. Vital signs stable, he is afebrile. He has a benign physical examination. His EKG is reviewed and unchanged from prior, not consistent with ischemia. Labs reviewed. Unremarkable CBC, BMP and troponin is negative. I doubt that his symptoms today represent ACS. Heart score is 2 and he is very low risk. PERC negative and I doubt PE. Clinical picture not consistent with acute aortic dissection as pain is not ripping or tearing in nature, does not radiate to the back or down towards the feet. Nonspecific chest pain, possibly muscle skeletal in the setting of new upper respiratory tract infection symptoms. We'll treat his pain, observe on the cardiac monitor briefly. We'll swab for flu. Reassess for disposition.  ----------------------------------------- 3:05 PM on 03/25/2016 ----------------------------------------- EKG shows some sinus arrhythmia but no other certificate arrhythmias noted while the patient was observed  on cardiac monitor. Flu negative. Urine drug screen positive for cocaine and cannabis as in the past. Patient reports he feels much better after ibuprofen. We discussed return precautions, need for close PCP follow-up and he is comfortable with the discharge plan. DC home. ____________________________________________   FINAL CLINICAL IMPRESSION(S) / ED DIAGNOSES  Final diagnoses:  Chest pain, unspecified chest pain type  URI, acute      Gayla DossEryka A Dakai Braithwaite, MD 03/25/16 1506

## 2016-03-25 NOTE — ED Notes (Signed)
Pt states he woke up this morning with chest pain and sob. Pt states he feels like his heart stops then starts again. Pt also reports cough and cold symptoms. Denies fever. Reports sweats and chills.

## 2016-05-02 ENCOUNTER — Emergency Department
Admission: EM | Admit: 2016-05-02 | Discharge: 2016-05-02 | Disposition: A | Discharge status: Home / Self Care | Payer: Self-pay | Attending: Emergency Medicine | Admitting: Emergency Medicine

## 2016-05-02 ENCOUNTER — Emergency Department: Payer: Self-pay

## 2016-05-02 ENCOUNTER — Encounter: Payer: Self-pay | Admitting: Emergency Medicine

## 2016-05-02 DIAGNOSIS — R042 Hemoptysis: Secondary | ICD-10-CM

## 2016-05-02 DIAGNOSIS — Z79891 Long term (current) use of opiate analgesic: Secondary | ICD-10-CM | POA: Insufficient documentation

## 2016-05-02 DIAGNOSIS — F17210 Nicotine dependence, cigarettes, uncomplicated: Secondary | ICD-10-CM | POA: Insufficient documentation

## 2016-05-02 DIAGNOSIS — F319 Bipolar disorder, unspecified: Secondary | ICD-10-CM | POA: Insufficient documentation

## 2016-05-02 DIAGNOSIS — Z79899 Other long term (current) drug therapy: Secondary | ICD-10-CM | POA: Insufficient documentation

## 2016-05-02 DIAGNOSIS — F1420 Cocaine dependence, uncomplicated: Secondary | ICD-10-CM | POA: Insufficient documentation

## 2016-05-02 DIAGNOSIS — F1410 Cocaine abuse, uncomplicated: Secondary | ICD-10-CM | POA: Insufficient documentation

## 2016-05-02 DIAGNOSIS — K922 Gastrointestinal hemorrhage, unspecified: Secondary | ICD-10-CM | POA: Insufficient documentation

## 2016-05-02 DIAGNOSIS — F329 Major depressive disorder, single episode, unspecified: Secondary | ICD-10-CM | POA: Insufficient documentation

## 2016-05-02 DIAGNOSIS — J069 Acute upper respiratory infection, unspecified: Secondary | ICD-10-CM | POA: Insufficient documentation

## 2016-05-02 DIAGNOSIS — Z5321 Procedure and treatment not carried out due to patient leaving prior to being seen by health care provider: Secondary | ICD-10-CM | POA: Insufficient documentation

## 2016-05-02 DIAGNOSIS — F1210 Cannabis abuse, uncomplicated: Secondary | ICD-10-CM | POA: Insufficient documentation

## 2016-05-02 DIAGNOSIS — F1220 Cannabis dependence, uncomplicated: Secondary | ICD-10-CM | POA: Insufficient documentation

## 2016-05-02 LAB — CBC WITH DIFFERENTIAL/PLATELET
BASOS PCT: 0 %
Band Neutrophils: 0 %
Basophils Absolute: 0 10*3/uL (ref 0–0.1)
Blasts: 0 %
EOS PCT: 3 %
Eosinophils Absolute: 0.3 10*3/uL (ref 0–0.7)
HCT: 42.7 % (ref 40.0–52.0)
Hemoglobin: 14.7 g/dL (ref 13.0–18.0)
LYMPHS ABS: 3.6 10*3/uL (ref 1.0–3.6)
Lymphocytes Relative: 36 %
MCH: 33.1 pg (ref 26.0–34.0)
MCHC: 34.4 g/dL (ref 32.0–36.0)
MCV: 96.3 fL (ref 80.0–100.0)
METAMYELOCYTES PCT: 0 %
MONO ABS: 0.5 10*3/uL (ref 0.2–1.0)
MONOS PCT: 5 %
MYELOCYTES: 0 %
NEUTROS ABS: 5.5 10*3/uL (ref 1.4–6.5)
NEUTROS PCT: 56 %
NRBC: 0 /100{WBCs}
Other: 0 %
PLATELETS: 245 10*3/uL (ref 150–440)
Promyelocytes Absolute: 0 %
RBC: 4.43 MIL/uL (ref 4.40–5.90)
RDW: 12.4 % (ref 11.5–14.5)
WBC: 9.9 10*3/uL (ref 3.8–10.6)

## 2016-05-02 LAB — COMPREHENSIVE METABOLIC PANEL
ALT: 19 U/L (ref 17–63)
ANION GAP: 11 (ref 5–15)
AST: 23 U/L (ref 15–41)
Albumin: 4.6 g/dL (ref 3.5–5.0)
Alkaline Phosphatase: 67 U/L (ref 38–126)
BUN: 9 mg/dL (ref 6–20)
CHLORIDE: 102 mmol/L (ref 101–111)
CO2: 23 mmol/L (ref 22–32)
Calcium: 9.1 mg/dL (ref 8.9–10.3)
Creatinine, Ser: 0.86 mg/dL (ref 0.61–1.24)
Glucose, Bld: 101 mg/dL — ABNORMAL HIGH (ref 65–99)
POTASSIUM: 3.3 mmol/L — AB (ref 3.5–5.1)
SODIUM: 136 mmol/L (ref 135–145)
Total Bilirubin: 0.5 mg/dL (ref 0.3–1.2)
Total Protein: 7.8 g/dL (ref 6.5–8.1)

## 2016-05-02 MED ORDER — IPRATROPIUM-ALBUTEROL 0.5-2.5 (3) MG/3ML IN SOLN
3.0000 mL | Freq: Once | RESPIRATORY_TRACT | Status: AC
  Administered 2016-05-02: 3 mL via RESPIRATORY_TRACT

## 2016-05-02 MED ORDER — IPRATROPIUM-ALBUTEROL 0.5-2.5 (3) MG/3ML IN SOLN
RESPIRATORY_TRACT | Status: AC
  Administered 2016-05-02: 3 mL via RESPIRATORY_TRACT
  Filled 2016-05-02: qty 3

## 2016-05-02 NOTE — ED Notes (Signed)
Pt c/o coughing with blood since last night. Pt describes blood to bright and dark with some clots. Pt denies ingestion of any type. Pt in NAD at this time. Pt denies nausea

## 2016-05-02 NOTE — ED Notes (Signed)
Pt presents to ED with c/o coughing up blood and vomiting blood around 2000 tonight. Pt reports that he has a hx of an upper GI bleed a few years ago. +ETOH daily. Denies recent upper respiratory symptoms. Currently has no increased work of breathing or acute distress noted at this time.

## 2016-05-02 NOTE — ED Provider Notes (Signed)
[facility] Emergency Department Provider Note  ____________________________________________  Time seen: On arrival  I have reviewed the triage vital signs and the nursing notes.   HISTORY  Chief Complaint Hematemesis    HPI KORAY SOTER is a 25 y.o. male who presents with hemoptysis this morning but none since then. Patient reports he has been coughing for the last 2-3 days. Last night he coughed up a small amount of blood-streaked mucus and again this morning. He has not done so since then. He denies nausea or vomiting. No abdominal pain. No shortness of breath. He does smoke cigarettes. No recent travel. No calf pain or swelling. No history of blood clots.    Past Medical History  Diagnosis Date  . GI bleeding   . GERD (gastroesophageal reflux disease)   . Anxiety   . Depression   . PTSD (post-traumatic stress disorder)   . Bipolar 1 disorder (HCC)   . Allergy   . GI (gastrointestinal bleed)     Patient Active Problem List   Diagnosis Date Noted  . Borderline personality disorder 02/10/2016  . Substance induced mood disorder (HCC) 02/09/2016  . Alcohol withdrawal (HCC) 08/23/2015  . Tobacco use disorder 08/23/2015  . Cocaine use disorder, severe, dependence (HCC) 08/11/2015  . Alcohol use disorder, severe, dependence (HCC) 08/11/2015  . Cannabis use disorder, severe, dependence (HCC) 08/11/2015    History reviewed. No pertinent past surgical history.  Current Outpatient Rx  Name  Route  Sig  Dispense  Refill  . acamprosate (CAMPRAL) 333 MG tablet   Oral   Take 2 tablets (666 mg total) by mouth 3 (three) times daily with meals.   42 tablet   0   . hydrocortisone cream 0.5 %   Topical   Apply topically 2 (two) times daily.   30 g   0   . naltrexone (DEPADE) 50 MG tablet   Oral   Take 0.5 tablets (25 mg total) by mouth daily.   7 tablet   0   . pantoprazole (PROTONIX) 40 MG tablet   Oral   Take 1 tablet (40 mg total) by mouth  daily before breakfast.   7 tablet   0   . traZODone (DESYREL) 150 MG tablet   Oral   Take 1 tablet (150 mg total) by mouth at bedtime as needed for sleep.   7 tablet   0     Allergies Review of patient's allergies indicates no known allergies.  Family History  Problem Relation Age of Onset  . Stroke Mother     Social History Social History  Substance Use Topics  . Smoking status: Current Every Day Smoker -- 0.75 packs/day    Types: Cigarettes  . Smokeless tobacco: Former Neurosurgeon  . Alcohol Use: Yes     Comment: 4 beer cans (24 oz) every day     Review of Systems  Constitutional: Negative for fever. Eyes: Negative for visual changes. ENT: Negative for sore throat Respiratory: No shortness of breath   Musculoskeletal: Negative for back pain. Skin: Negative for rash. Neurological: Negative for headaches    ____________________________________________   PHYSICAL EXAM:  VITAL SIGNS: ED Triage Vitals  Enc Vitals Group     BP 05/02/16 1611 125/80 mmHg     Pulse Rate 05/02/16 1611 73     Resp 05/02/16 1611 20     Temp 05/02/16 1611 98.2 F (36.8 C)     Temp Source 05/02/16 1732 Oral  SpO2 05/02/16 1611 100 %     Weight 05/02/16 1611 130 lb (58.968 kg)     Height 05/02/16 1611 5\' 5"  (1.651 m)     Head Cir --      Peak Flow --      Pain Score 05/02/16 1611 5     Pain Loc --      Pain Edu? --      Excl. in GC? --      Constitutional: Alert and oriented. Well appearing and in no distress. Eyes: Conjunctivae are normal.  ENT   Head: Normocephalic and atraumatic.   Mouth/Throat: Mucous membranes are moist. Cardiovascular: Normal rate, regular rhythm.  Respiratory: Normal respiratory effort without tachypnea nor retractions. Clear to auscultation bilaterally Gastrointestinal: Soft and non-tender in all quadrants. No distention. There is no CVA tenderness. Musculoskeletal: Nontender with normal range of motion in all extremities. Neurologic:   Normal speech and language. No gross focal neurologic deficits are appreciated. Skin:  Skin is warm, dry and intact. No rash noted. Psychiatric: Mood and affect are normal. Patient exhibits appropriate insight and judgment.  ____________________________________________    LABS (pertinent positives/negatives)  Labs Reviewed - No data to display  ____________________________________________     ____________________________________________    RADIOLOGY I have personally reviewed any xrays that were ordered on this patient: Unremarkable chest x-ray  ____________________________________________   PROCEDURES  Procedure(s) performed: none   ____________________________________________   INITIAL IMPRESSION / ASSESSMENT AND PLAN / ED COURSE  Pertinent labs & imaging results that were available during my care of the patient were reviewed by me and considered in my medical decision making (see chart for details).  Patient well-appearing and in no distress. Counseled him on smoking cessation. DuoNeb provided for scattered wheeze in the left lower quadrant. X-rays unremarkable. Recommended supportive care.  ____________________________________________   FINAL CLINICAL IMPRESSION(S) / ED DIAGNOSES  Final diagnoses:  Upper respiratory infection  Hemoptysis     Jene Everyobert Ludene Stokke, MD 05/02/16 2247

## 2016-05-02 NOTE — ED Notes (Addendum)
Pt to ed with c/o coughing up blood and vomiting blood last night.  Pt states he was here last night but left before being seen.  Reports hx of GI bleed.  States last bleeding was noted at 7 am today after hard coughing, reports use of cocaine 4 days ago, marijuana use last night.  Pt denies vomiting today.

## 2016-05-02 NOTE — Discharge Instructions (Signed)
Hemoptysis  Hemoptysis, which means coughing up blood, can be a sign of a minor problem or a serious medical condition. The blood that is coughed up may come from the lungs and airways. Coughed-up blood can also come from bleeding that occurs outside the lungs and airways. Blood can drain into the windpipe during a severe nosebleed or when blood is vomited from the stomach. Because hemoptysis can be a sign of something serious, a medical evaluation is required. For some people with hemoptysis, no definite cause is ever identified.  CAUSES   The most common cause of hemoptysis is bronchitis. Some other common causes include:   · A ruptured blood vessel caused by coughing or an infection.    · A medical condition that causes damage to the large air passageways (bronchiectasis).    · A blood clot in the lungs (pulmonary embolism).    · Pneumonia.    · Tuberculosis.    · Breathing in a small foreign object.    · Cancer.  For some people with hemoptysis, no definite cause is ever identified.    HOME CARE INSTRUCTIONS  · Only take over-the-counter or prescription medicines as directed by your caregiver. Do not use cough suppressants unless your caregiver approves.  · If your caregiver prescribes antibiotic medicines, take them as directed. Finish them even if you start to feel better.  · Do not smoke. Also avoid secondhand smoke.  · Follow up with your caregiver as directed.  SEEK IMMEDIATE MEDICAL CARE IF:   · You cough up bloody mucus for longer than a week.  · You have a blood-producing cough that is severe or getting worse.  · You have a blood-producing cough that comes and goes over time.  · You develop problems with your breathing.    · You vomit blood.  · You develop bloody or black-colored stools.  · You have chest pain.    · You develop night sweats.  · You feel faint or pass out.    · You have a fever or persistent symptoms for more than 2-3 days.    · You have a fever and your symptoms suddenly get worse.  MAKE  SURE YOU:  · Understand these instructions.  · Will watch your condition.  · Will get help right away if you are not doing well or get worse.     This information is not intended to replace advice given to you by your health care provider. Make sure you discuss any questions you have with your health care provider.     Document Released: 02/19/2002 Document Revised: 11/27/2012 Document Reviewed: 09/27/2012  [employer]vier Interactive Patient Education ©2016 [employer]

## 2016-05-02 NOTE — ED Notes (Signed)
Spoke with dr Derrill Kaygoodman, no orders received at this time.

## 2016-06-04 ENCOUNTER — Emergency Department
Admission: EM | Admit: 2016-06-04 | Discharge: 2016-06-04 | Disposition: A | Discharge status: Home / Self Care | Payer: Self-pay | Attending: Emergency Medicine | Admitting: Emergency Medicine

## 2016-06-04 DIAGNOSIS — F1994 Other psychoactive substance use, unspecified with psychoactive substance-induced mood disorder: Secondary | ICD-10-CM | POA: Insufficient documentation

## 2016-06-04 DIAGNOSIS — Y9389 Activity, other specified: Secondary | ICD-10-CM | POA: Insufficient documentation

## 2016-06-04 DIAGNOSIS — W260XXA Contact with knife, initial encounter: Secondary | ICD-10-CM | POA: Insufficient documentation

## 2016-06-04 DIAGNOSIS — S61412A Laceration without foreign body of left hand, initial encounter: Secondary | ICD-10-CM

## 2016-06-04 DIAGNOSIS — F17210 Nicotine dependence, cigarettes, uncomplicated: Secondary | ICD-10-CM | POA: Insufficient documentation

## 2016-06-04 DIAGNOSIS — F319 Bipolar disorder, unspecified: Secondary | ICD-10-CM | POA: Insufficient documentation

## 2016-06-04 DIAGNOSIS — Y929 Unspecified place or not applicable: Secondary | ICD-10-CM | POA: Insufficient documentation

## 2016-06-04 DIAGNOSIS — S61012A Laceration without foreign body of left thumb without damage to nail, initial encounter: Secondary | ICD-10-CM | POA: Insufficient documentation

## 2016-06-04 DIAGNOSIS — F1420 Cocaine dependence, uncomplicated: Secondary | ICD-10-CM | POA: Insufficient documentation

## 2016-06-04 DIAGNOSIS — Y999 Unspecified external cause status: Secondary | ICD-10-CM | POA: Insufficient documentation

## 2016-06-04 DIAGNOSIS — F1220 Cannabis dependence, uncomplicated: Secondary | ICD-10-CM | POA: Insufficient documentation

## 2016-06-04 DIAGNOSIS — Z79899 Other long term (current) drug therapy: Secondary | ICD-10-CM | POA: Insufficient documentation

## 2016-06-04 DIAGNOSIS — Z79891 Long term (current) use of opiate analgesic: Secondary | ICD-10-CM | POA: Insufficient documentation

## 2016-06-04 MED ORDER — TRAMADOL HCL 50 MG PO TABS: 50.0000 mg | ORAL_TABLET | Freq: Four times a day (QID) | ORAL | Status: DC | PRN

## 2016-06-04 MED ORDER — AMOXICILLIN-POT CLAVULANATE 875-125 MG PO TABS: 1.0000 | ORAL_TABLET | Freq: Two times a day (BID) | ORAL | Status: AC

## 2016-06-04 MED ORDER — LIDOCAINE HCL (PF) 1 % IJ SOLN
INTRAMUSCULAR | Status: AC
  Filled 2016-06-04: qty 5

## 2016-06-04 NOTE — ED Provider Notes (Signed)
[facility] Emergency Department Provider Note   ____________________________________________  Time seen: Approximately 441 AM  I have reviewed the triage vital signs and the nursing notes.   HISTORY  Chief Complaint Extremity Laceration  History is limited by patient's alcohol intoxication  HPI Carlos French is a 25 y.o. male who comes into the hospital today with a laceration to his hand.Patient reports these cut his hand while fishing today. He reports it was with a fishing knife. It is his left hand. The patient was about an hour away from [place] and he drove 4-5 hours back here to be seen. The patient reports that he was driving intoxicated. He reports that he did report kerosene into the wound. When evaluated he just yells and cries out that he's hurting. He is here for evaluation.   Past Medical History  Diagnosis Date  . GI bleeding   . GERD (gastroesophageal reflux disease)   . Anxiety   . Depression   . PTSD (post-traumatic stress disorder)   . Bipolar 1 disorder (HCC)   . Allergy   . GI (gastrointestinal bleed)     Patient Active Problem List   Diagnosis Date Noted  . Borderline personality disorder 02/10/2016  . Substance induced mood disorder (HCC) 02/09/2016  . Alcohol withdrawal (HCC) 08/23/2015  . Tobacco use disorder 08/23/2015  . Cocaine use disorder, severe, dependence (HCC) 08/11/2015  . Alcohol use disorder, severe, dependence (HCC) 08/11/2015  . Cannabis use disorder, severe, dependence (HCC) 08/11/2015    No past surgical history on file.  Current Outpatient Rx  Name  Route  Sig  Dispense  Refill  . acamprosate (CAMPRAL) 333 MG tablet   Oral   Take 2 tablets (666 mg total) by mouth 3 (three) times daily with meals.   42 tablet   0   . amoxicillin-clavulanate (AUGMENTIN) 875-125 MG tablet   Oral   Take 1 tablet by mouth 2 (two) times daily.   20 tablet   0   . hydrocortisone cream 0.5 %   Topical   Apply  topically 2 (two) times daily.   30 g   0   . naltrexone (DEPADE) 50 MG tablet   Oral   Take 0.5 tablets (25 mg total) by mouth daily.   7 tablet   0   . pantoprazole (PROTONIX) 40 MG tablet   Oral   Take 1 tablet (40 mg total) by mouth daily before breakfast.   7 tablet   0   . traMADol (ULTRAM) 50 MG tablet   Oral   Take 1 tablet (50 mg total) by mouth every 6 (six) hours as needed.   12 tablet   0   . traZODone (DESYREL) 150 MG tablet   Oral   Take 1 tablet (150 mg total) by mouth at bedtime as needed for sleep.   7 tablet   0     Allergies Review of patient's allergies indicates no known allergies.  Family History  Problem Relation Age of Onset  . Stroke Mother     Social History Social History  Substance Use Topics  . Smoking status: Current Every Day Smoker -- 0.75 packs/day    Types: Cigarettes  . Smokeless tobacco: Former Neurosurgeon  . Alcohol Use: Yes     Comment: 4 beer cans (24 oz) every day     Review of Systems Constitutional: No fever/chills Eyes: No visual changes. ENT: No sore throat. Cardiovascular: Denies chest pain. Respiratory: Denies shortness  of breath. Gastrointestinal: No abdominal pain.  No nausea, no vomiting.  No diarrhea.  No constipation. Genitourinary: Negative for dysuria. Musculoskeletal: Negative for back pain. Skin: And laceration Neurological: Negative for headaches, focal weakness or numbness.  10-point ROS otherwise negative.  ____________________________________________   PHYSICAL EXAM:  VITAL SIGNS: ED Triage Vitals  Enc Vitals Group     BP 06/04/16 0254 124/83 mmHg     Pulse Rate 06/04/16 0254 99     Resp 06/04/16 0254 18     Temp 06/04/16 0254 97.6 F (36.4 C)     Temp Source 06/04/16 0254 Oral     SpO2 06/04/16 0254 99 %     Weight 06/04/16 0254 120 lb (54.432 kg)     Height 06/04/16 0254 5\' 6"  (1.676 m)     Head Cir --      Peak Flow --      Pain Score 06/04/16 0304 10     Pain Loc --      Pain  Edu? --      Excl. in GC? --     Constitutional:Patient intoxicated, somnolent Eyes: Conjunctivae are normal. PERRL. EOMI. Head: Atraumatic. Nose: No congestion/rhinnorhea. Mouth/Throat: Mucous membranes are moist.  Oropharynx non-erythematous. Cardiovascular: Normal rate, regular rhythm. Grossly normal heart sounds.  Good peripheral circulation. Respiratory: Normal respiratory effort.  No retractions. Lungs CTAB. Gastrointestinal: Soft and nontender. No distention. Positive bowel sounds Musculoskeletal: No lower extremity tenderness nor edema.   Neurologic:  Normal speech and language.  Skin:  Laceration along the base of the left thumb Psychiatric: Mood and affect are normal. .  ____________________________________________   LABS (all labs ordered are listed, but only abnormal results are displayed)  Labs Reviewed - No data to display ____________________________________________  EKG  None ____________________________________________  RADIOLOGY  None ____________________________________________   PROCEDURES  Procedure(s) performed: please, see procedure note(s).   LACERATION REPAIR Performed by: Lucrezia EuropeWebster,  Mikal Wisman P Authorized by: Lucrezia EuropeWebster,  Verneda Hollopeter P Consent: Verbal consent obtained. Risks and benefits: risks, benefits and alternatives were discussed Consent given by: patient Patient identity confirmed: provided demographic data Prepped and Draped in normal sterile fashion Wound explored  Laceration Location: left hand between the thumb and index finger  Laceration Length: 10 cm  No Foreign Bodies seen or palpated  Anesthesia: local infiltration  Local anesthetic: lidocaine 1% without epinephrine  Anesthetic total: 7 ml  Irrigation method: syringe Amount of cleaning: standard  Skin closure: 4.0 ethilon  Number of sutures: 9  Technique: simple interrupted.   Patient tolerance: Patient tolerated the procedure well with no immediate  complications.   Critical Care performed: No  ____________________________________________   INITIAL IMPRESSION / ASSESSMENT AND PLAN / ED COURSE  Pertinent labs & imaging results that were available during my care of the patient were reviewed by me and considered in my medical decision making (see chart for details).  This is a 25 year old male who comes into the hospital today with laceration to the base of his thumb. I will repair the wound and the patient will remain here to sober until he is able to go home.  I did suture the patient's wound. I will have it wrapped in a bulky dressing to keep him from moving it around. I will also have the patient follow up with orthopedic surgery and I will treat him with some antibiotics given the delayed closure. Dr. Scotty CourtStafford will reassess the patient once he is ready for discharge. ____________________________________________   FINAL CLINICAL IMPRESSION(S) / ED DIAGNOSES  Final diagnoses:  Hand laceration, left, initial encounter      NEW MEDICATIONS STARTED DURING THIS VISIT:  New Prescriptions   AMOXICILLIN-CLAVULANATE (AUGMENTIN) 875-125 MG TABLET    Take 1 tablet by mouth 2 (two) times daily.   TRAMADOL (ULTRAM) 50 MG TABLET    Take 1 tablet (50 mg total) by mouth every 6 (six) hours as needed.     Note:  This document was prepared using Dragon voice recognition software and may include unintentional dictation errors.    Rebecka Apley, MD 06/04/16 [phone]

## 2016-06-04 NOTE — ED Notes (Signed)
Report to kim, rn.  

## 2016-06-04 NOTE — ED Notes (Signed)
RN called by ES to determine if pt had been discharged and if the room could be cleaned. RN returned to room to find patient had left without his discharge paperwork and his prescriptions. RN placed discharge paperwork at front desk with 1st nurse in case the pt returned.

## 2016-06-04 NOTE — ED Notes (Signed)
Pt sleeping, resps unlabored. Blanket provided.

## 2016-06-04 NOTE — Discharge Instructions (Signed)
Laceration Care, Adult °A laceration is a cut that goes through all of the layers of the skin and into the tissue that is right under the skin. Some lacerations heal on their own. Others need to be closed with stitches (sutures), staples, skin adhesive strips, or skin glue. Proper laceration care minimizes the risk of infection and helps the laceration to heal better. °HOW TO CARE FOR YOUR LACERATION °If sutures or staples were used: °· Keep the wound clean and dry. °· If you were given a bandage (dressing), you should change it at least one time per day or as told by your health care provider. You should also change it if it becomes wet or dirty. °· Keep the wound completely dry for the first 24 hours or as told by your health care provider. After that time, you may shower or bathe. However, make sure that the wound is not soaked in water until after the sutures or staples have been removed. °· Clean the wound one time each day or as told by your health care provider: °¨ Wash the wound with soap and water. °¨ Rinse the wound with water to remove all soap. °¨ Pat the wound dry with a clean towel. Do not rub the wound. °· After cleaning the wound, apply a thin layer of antibiotic ointment as told by your health care provider. This will help to prevent infection and keep the dressing from sticking to the wound. °· Have the sutures or staples removed as told by your health care provider. °If skin adhesive strips were used: °· Keep the wound clean and dry. °· If you were given a bandage (dressing), you should change it at least one time per day or as told by your health care provider. You should also change it if it becomes dirty or wet. °· Do not get the skin adhesive strips wet. You may shower or bathe, but be careful to keep the wound dry. °· If the wound gets wet, pat it dry with a clean towel. Do not rub the wound. °· Skin adhesive strips fall off on their own. You may trim the strips as the wound heals. Do not  remove skin adhesive strips that are still stuck to the wound. They will fall off in time. °If skin glue was used: °· Try to keep the wound dry, but you may briefly wet it in the shower or bath. Do not soak the wound in water, such as by swimming. °· After you have showered or bathed, gently pat the wound dry with a clean towel. Do not rub the wound. °· Do not do any activities that will make you sweat heavily until the skin glue has fallen off on its own. °· Do not apply liquid, cream, or ointment medicine to the wound while the skin glue is in place. Using those may loosen the film before the wound has healed. °· If you were given a bandage (dressing), you should change it at least one time per day or as told by your health care provider. You should also change it if it becomes dirty or wet. °· If a dressing is placed over the wound, be careful not to apply tape directly over the skin glue. Doing that may cause the glue to be pulled off before the wound has healed. °· Do not pick at the glue. The skin glue usually remains in place for 5-10 days, then it falls off of the skin. °General Instructions °· Take over-the-counter and prescription   medicines only as told by your health care provider.  If you were prescribed an antibiotic medicine or ointment, take or apply it as told by your doctor. Do not stop using it even if your condition improves.  To help prevent scarring, make sure to cover your wound with sunscreen whenever you are outside after stitches are removed, after adhesive strips are removed, or when glue remains in place and the wound is healed. Make sure to wear a sunscreen of at least 30 SPF.  Do not scratch or pick at the wound.  Keep all follow-up visits as told by your health care provider. This is important.  Check your wound every day for signs of infection. Watch for:  Redness, swelling, or pain.  Fluid, blood, or pus.  Raise (elevate) the injured area above the level of your heart  while you are sitting or lying down, if possible. SEEK MEDICAL CARE IF:  You received a tetanus shot and you have swelling, severe pain, redness, or bleeding at the injection site.  You have a fever.  A wound that was closed breaks open.  You notice a bad smell coming from your wound or your dressing.  You notice something coming out of the wound, such as wood or glass.  Your pain is not controlled with medicine.  You have increased redness, swelling, or pain at the site of your wound.  You have fluid, blood, or pus coming from your wound.  You notice a change in the color of your skin near your wound.  You need to change the dressing frequently due to fluid, blood, or pus draining from the wound.  You develop a new rash.  You develop numbness around the wound. SEEK IMMEDIATE MEDICAL CARE IF:  You develop severe swelling around the wound.  Your pain suddenly increases and is severe.  You develop painful lumps near the wound or on skin that is anywhere on your body.  You have a red streak going away from your wound.  The wound is on your hand or foot and you cannot properly move a finger or toe.  The wound is on your hand or foot and you notice that your fingers or toes look pale or bluish.   This information is not intended to replace advice given to you by your health care provider. Make sure you discuss any questions you have with your health care provider.   Document Released: 12/11/2005 Document Revised: 04/27/2015 Document Reviewed: 12/07/2014 Elsevier Interactive Patient Education 2016 [employer]  Wound Care Taking care of your wound properly can help to prevent pain and infection. It can also help your wound to heal more quickly.  HOW TO CARE FOR YOUR WOUND  Take or apply over-the-counter and prescription medicines only as told by your health care provider.  If you were prescribed antibiotic medicine, take or apply it as told by your health care  provider. Do not stop using the antibiotic even if your condition improves.  Clean the wound each day or as told by your health care provider.  Wash the wound with mild soap and water.  Rinse the wound with water to remove all soap.  Pat the wound dry with a clean towel. Do not rub it.  There are many different ways to close and cover a wound. For example, a wound can be covered with stitches (sutures), skin glue, or adhesive strips. Follow instructions from your health care provider about:  How to take care of your wound.  When and how you should change your bandage (dressing).  When you should remove your dressing.  Removing whatever was used to close your wound.  Check your wound every day for signs of infection. Watch for:  Redness, swelling, or pain.  Fluid, blood, or pus.  Keep the dressing dry until your health care provider says it can be removed. Do not take baths, swim, use a hot tub, or do anything that would put your wound underwater until your health care provider approves.  Raise (elevate) the injured area above the level of your heart while you are sitting or lying down.  Do not scratch or pick at the wound.  Keep all follow-up visits as told by your health care provider. This is important. SEEK MEDICAL CARE IF:  You received a tetanus shot and you have swelling, severe pain, redness, or bleeding at the injection site.  You have a fever.  Your pain is not controlled with medicine.  You have increased redness, swelling, or pain at the site of your wound.  You have fluid, blood, or pus coming from your wound.  You notice a bad smell coming from your wound or your dressing. SEEK IMMEDIATE MEDICAL CARE IF:  You have a red streak going away from your wound.   This information is not intended to replace advice given to you by your health care provider. Make sure you discuss any questions you have with your health care provider.   Document Released:  09/19/2008 Document Revised: 04/27/2015 Document Reviewed: 12/07/2014 Elsevier Interactive Patient Education [employer]

## 2016-06-04 NOTE — ED Notes (Signed)
md in to repair wound.

## 2016-06-04 NOTE — ED Notes (Signed)
Pt left before discharge papers could be reviewed and prescriptions given. Per ES and Web designerstaff secretary pt was ambulatory and NAD at the time.

## 2016-06-04 NOTE — ED Provider Notes (Signed)
Patient is ambulatory with steady gait, talks with clear speech. Her thought process. Clinically sober. He also has a ride coming to pick him up and not driving himself home anyway. Educated on wound care. Vital signs remain stable and normal. We'll discharge home. Antibiotics as prescribed by Dr. Zenda AlpersWebster.  Sharman CheekPhillip Maui Ahart, MD 06/04/16 [phone]

## 2016-06-04 NOTE — ED Notes (Signed)
Pt reports approx 4 to 5 hours ago he was fishing when he cut his left hand between his thumb and index finger with a knife while trying to cut a fishing line. Pt reports he poured kerosene on the wound. Pt when asked when his last tetanus shot was says it was approx a month ago when "[facility]".  Pt has approx 3 to 4 cm by 2 to 3cm wound to area between left thumb and index finger with bleeding controlled.

## 2016-06-04 NOTE — ED Notes (Signed)
Pt asleep, vital signs obtained.

## 2016-06-04 NOTE — ED Notes (Signed)
Hand wrapped with thumb immobilized as much as possible. Pt experienced a great deal of pain and jerking hand away while wrapping.

## 2017-05-23 ENCOUNTER — Emergency Department (HOSPITAL_COMMUNITY)
Admission: EM | Admit: 2017-05-23 | Discharge: 2017-05-24 | Disposition: A | Discharge status: Home / Self Care | Payer: Self-pay

## 2017-05-23 ENCOUNTER — Encounter (HOSPITAL_COMMUNITY): Payer: Self-pay

## 2017-05-23 DIAGNOSIS — F32A Depression, unspecified: Secondary | ICD-10-CM

## 2017-05-23 DIAGNOSIS — F329 Major depressive disorder, single episode, unspecified: Secondary | ICD-10-CM | POA: Insufficient documentation

## 2017-05-23 DIAGNOSIS — F1020 Alcohol dependence, uncomplicated: Secondary | ICD-10-CM | POA: Diagnosis present

## 2017-05-23 DIAGNOSIS — F17210 Nicotine dependence, cigarettes, uncomplicated: Secondary | ICD-10-CM | POA: Insufficient documentation

## 2017-05-23 LAB — CBC
HCT: 47.6 % (ref 39.0–52.0)
Hemoglobin: 16.9 g/dL (ref 13.0–17.0)
MCH: 32.7 pg (ref 26.0–34.0)
MCHC: 35.5 g/dL (ref 30.0–36.0)
MCV: 92.1 fL (ref 78.0–100.0)
Platelets: 271 10*3/uL (ref 150–400)
RBC: 5.17 MIL/uL (ref 4.22–5.81)
RDW: 13.3 % (ref 11.5–15.5)
WBC: 8.8 10*3/uL (ref 4.0–10.5)

## 2017-05-23 LAB — COMPREHENSIVE METABOLIC PANEL
ALT: 18 U/L (ref 17–63)
AST: 25 U/L (ref 15–41)
Albumin: 4.9 g/dL (ref 3.5–5.0)
Alkaline Phosphatase: 80 U/L (ref 38–126)
Anion gap: 12 (ref 5–15)
BUN: 9 mg/dL (ref 6–20)
CO2: 26 mmol/L (ref 22–32)
Calcium: 9.6 mg/dL (ref 8.9–10.3)
Chloride: 100 mmol/L — ABNORMAL LOW (ref 101–111)
Creatinine, Ser: 0.89 mg/dL (ref 0.61–1.24)
GFR calc Af Amer: >60 mL/min (ref >60)
GFR calc non Af Amer: >60 mL/min (ref >60)
Glucose, Bld: 86 mg/dL (ref 65–99)
Potassium: 3.8 mmol/L (ref 3.5–5.1)
Sodium: 138 mmol/L (ref 135–145)
Total Bilirubin: 0.9 mg/dL (ref 0.3–1.2)
Total Protein: 8.7 g/dL — ABNORMAL HIGH (ref 6.5–8.1)

## 2017-05-23 LAB — RAPID URINE DRUG SCREEN, HOSP PERFORMED
Amphetamines: NOT DETECTED
Barbiturates: NOT DETECTED
Benzodiazepines: NOT DETECTED
Cocaine: NOT DETECTED
Opiates: NOT DETECTED
Tetrahydrocannabinol: POSITIVE — AB

## 2017-05-23 LAB — SALICYLATE LEVEL: Salicylate Lvl: <7 mg/dL (ref 2.8–30.0)

## 2017-05-23 LAB — ACETAMINOPHEN LEVEL: Acetaminophen (Tylenol), Serum: <10 ug/mL — ABNORMAL LOW (ref 10–30)

## 2017-05-23 LAB — ETHANOL: Alcohol, Ethyl (B): 45 mg/dL — ABNORMAL HIGH (ref <5)

## 2017-05-23 MED ORDER — GI COCKTAIL ~~LOC~~
30.0000 mL | Freq: Once | ORAL | Status: AC
  Administered 2017-05-23: 30 mL via ORAL
  Filled 2017-05-23: qty 30

## 2017-05-23 MED ORDER — ZOLPIDEM TARTRATE 5 MG PO TABS: 5.0000 mg | ORAL_TABLET | Freq: Every evening | ORAL | Status: DC | PRN

## 2017-05-23 MED ORDER — ONDANSETRON HCL 4 MG PO TABS: 4.0000 mg | ORAL_TABLET | Freq: Three times a day (TID) | ORAL | Status: DC | PRN

## 2017-05-23 MED ORDER — ONDANSETRON 4 MG PO TBDP
4.0000 mg | ORAL_TABLET | Freq: Once | ORAL | Status: AC
Start: 2017-05-23 — End: 2017-05-23
  Administered 2017-05-23: 4 mg via ORAL
  Filled 2017-05-23: qty 1
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
## 2017-05-23 NOTE — ED Triage Notes (Signed)
States took himself of his psych medications about a year ago and now states for about one week wanting to hurt himself with plan of drinking himself to death or cutting self, also states was drinking heavily last night and fell down strairs and hurts all over alert and oriented x 3 moves all extremities.
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
## 2017-05-23 NOTE — ED Triage Notes (Signed)
Per EMS, patient from home, N/V today after "drinking too much alcohol last night." Also reports SI. Hx of same.  BP 130/90 CBG 82

## 2017-05-24 DIAGNOSIS — F1490 Cocaine use, unspecified, uncomplicated: Secondary | ICD-10-CM

## 2017-05-24 DIAGNOSIS — F329 Major depressive disorder, single episode, unspecified: Secondary | ICD-10-CM

## 2017-05-24 DIAGNOSIS — F32A Depression, unspecified: Secondary | ICD-10-CM | POA: Insufficient documentation

## 2017-05-24 DIAGNOSIS — F1290 Cannabis use, unspecified, uncomplicated: Secondary | ICD-10-CM

## 2017-05-24 DIAGNOSIS — F1020 Alcohol dependence, uncomplicated: Secondary | ICD-10-CM

## 2017-05-24 DIAGNOSIS — F17210 Nicotine dependence, cigarettes, uncomplicated: Secondary | ICD-10-CM

## 2017-05-24 NOTE — ED Notes (Signed)
No respiratory or acute distress noted alert and oriented x 3 hospital sitter with pt. 

## 2017-05-24 NOTE — ED Notes (Signed)
No respiratory or acute distress noted resting in bed with eyes closed hospital sitter with pt. 
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
## 2017-05-24 NOTE — ED Notes (Signed)
AM Assessment: Pt A/O, no noted distress. Denies pain/AVH/SI. Pt noted "I feel better than yesterday." Pt states he slept well. Staff will continue to monitor, meet needs, and maintain safety.

## 2017-05-24 NOTE — ED Notes (Signed)
No respiratory or acute distress noted alert and oriented x 3 pt wanded by security and took pt to [facility] with belongings.

## 2017-05-24 NOTE — ED Notes (Signed)
Patient educated about search process and term "contraband " and routine search performed. No contraband found. 

## 2017-05-24 NOTE — Discharge Instructions (Signed)
For your ongoing behavioral health needs you are advised to follow up with [facility].  New patients are seen at their walk-in clinic.  Walk-in hours are Monday - Friday from 8:00 am - 12:00 pm, and from 1:00 pm - 3:00 pm.  Walk-in patients are seen on a first come, first served basis, so try to arrive as early as possible for the best chance of being seen the same day.  There is an initial fee of $22.50: ° °     [facility] °     [address] °     [place], [place] [address] °     [phone] °

## 2017-05-24 NOTE — BH Assessment (Signed)
[facility] Assessment Progress Note  Per Thedore MinsMojeed Akintayo, MD, this pt does not require psychiatric hospitalization at this time.  [facility]  This has been included in pt's discharge instructions.  Pt's nurse, Angelique BlonderDenise, has been notified.  Doylene Canninghomas Anie Juniel, MA Triage Specialist [phone]

## 2017-05-24 NOTE — ED Notes (Signed)
Educated pt on discharge instruction and follow up appointment. Pt has recv'd all belongings.

## 2017-05-24 NOTE — B[facility] Suicide Risk Assessment (Signed)
[facility]  Principal Problem: Alcohol use disorder, severe, dependence (HCC) Discharge Diagnoses:  Patient Active Problem List   Diagnosis Date Noted  . Alcohol use disorder, severe, dependence (HCC) [F10.20] 08/11/2015    Priority: High  . Depression [F32.9]   . Borderline personality disorder [F60.3] 02/10/2016  . Substance induced mood disorder (HCC) [F19.94] 02/09/2016  . Alcohol withdrawal (HCC) [F10.239] 08/23/2015  . Tobacco use disorder [F17.200] 08/23/2015  . Cocaine use disorder, severe, dependence (HCC) [F14.20] 08/11/2015  . Cannabis use disorder, severe, dependence (HCC) [F12.20] 08/11/2015    Total Time spent with patient: 20 minutes  Musculoskeletal: Strength & Muscle Tone: within normal limits Gait & Station: normal Patient leans: N/A  Psychiatric Specialty Exam:   Blood pressure 120/71, pulse 62, temperature 98.3 F (36.8 C), temperature source Oral, resp. rate 16, SpO2 98 %.There is no height or weight on file to calculate BMI.   General Appearance: Casual and Fairly Groomed  Eye Contact:  Fair  Speech:  Clear and Coherent and Normal Rate  Volume:  Normal  Mood:  Depressed  Affect:  Appropriate, Congruent and Depressed  Thought Process:  Coherent, Goal Directed, Linear and Descriptions of Associations: Intact  Orientation:  Full (Time, Place, and Person)  Thought Content:  Focused on treatment options and discharge  Suicidal Thoughts:  No  Homicidal Thoughts:  No  Memory:  Immediate;   Fair Recent;   Fair Remote;   Fair  Judgement:  Fair  Insight:  Fair  Psychomotor Activity:  Normal  Concentration:  Concentration: Fair and Attention Span: Fair  Recall:  [employer]  Fund of Knowledge:  Fair  Language:  Fair  Akathisia:  No  Handed:    AIMS (if indicated):     Assets:  Communication Skills Desire for Improvement Resilience Social Support  ADL's:  Intact  Cognition:  WNL  Sleep:        Mental Status Per Nursing  Assessment::   On Admission:     Demographic Factors:  Male, Adolescent or young adult, Caucasian and Low socioeconomic status  Loss Factors: Financial problems/change in socioeconomic status  Historical Factors: Impulsivity  Risk Reduction Factors:   Responsible for children under 27 years of age, Sense of responsibility to family, Positive social support and Positive therapeutic relationship  Continued Clinical Symptoms:  Alcohol/Substance Abuse/Dependencies  Cognitive Features That Contribute To Risk:  Closed-mindedness    Suicide Risk:  Minimal: No identifiable suicidal ideation.  Patients presenting with no risk factors but with morbid ruminations; may be classified as minimal risk based on the severity of the depressive symptoms    Plan Of Care/Follow-up recommendations:  Activity:  As tolerated Diet:  Heart healthy with low sodium.  Beau FannyWithrow, Hebert Dooling C, FNP 05/24/2017, 1:01 PM

## 2017-08-12 ENCOUNTER — Emergency Department (HOSPITAL_COMMUNITY)
Admission: EM | Admit: 2017-08-12 | Discharge: 2017-08-12 | Disposition: A | Discharge status: Home / Self Care | Payer: Self-pay | Attending: Emergency Medicine | Admitting: Emergency Medicine

## 2017-08-12 ENCOUNTER — Encounter (HOSPITAL_COMMUNITY): Payer: Self-pay | Admitting: Emergency Medicine

## 2017-08-12 DIAGNOSIS — R112 Nausea with vomiting, unspecified: Secondary | ICD-10-CM | POA: Insufficient documentation

## 2017-08-12 DIAGNOSIS — Z5321 Procedure and treatment not carried out due to patient leaving prior to being seen by health care provider: Secondary | ICD-10-CM | POA: Insufficient documentation

## 2017-08-12 LAB — CBC WITH DIFFERENTIAL/PLATELET
BASOS PCT: 1 %
Basophils Absolute: 0.1 10*3/uL (ref 0.0–0.1)
Eosinophils Absolute: 0.4 10*3/uL (ref 0.0–0.7)
Eosinophils Relative: 5 %
HEMATOCRIT: 44.7 % (ref 39.0–52.0)
HEMOGLOBIN: 15.5 g/dL (ref 13.0–17.0)
LYMPHS ABS: 2.2 10*3/uL (ref 0.7–4.0)
LYMPHS PCT: 26 %
MCH: 31.9 pg (ref 26.0–34.0)
MCHC: 34.7 g/dL (ref 30.0–36.0)
MCV: 92 fL (ref 78.0–100.0)
Monocytes Absolute: 1.1 10*3/uL — ABNORMAL HIGH (ref 0.1–1.0)
Monocytes Relative: 13 %
Neutro Abs: 4.5 10*3/uL (ref 1.7–7.7)
Neutrophils Relative %: 55 %
Platelets: 235 10*3/uL (ref 150–400)
RBC: 4.86 MIL/uL (ref 4.22–5.81)
RDW: 13.2 % (ref 11.5–15.5)
WBC: 8.2 10*3/uL (ref 4.0–10.5)

## 2017-08-12 LAB — COMPREHENSIVE METABOLIC PANEL
ALBUMIN: 4.3 g/dL (ref 3.5–5.0)
ALT: 148 U/L — AB (ref 17–63)
AST: 99 U/L — ABNORMAL HIGH (ref 15–41)
Alkaline Phosphatase: 71 U/L (ref 38–126)
Anion gap: 13 (ref 5–15)
BILIRUBIN TOTAL: 0.5 mg/dL (ref 0.3–1.2)
BUN: 5 mg/dL — AB (ref 6–20)
CO2: 21 mmol/L — ABNORMAL LOW (ref 22–32)
CREATININE: 0.85 mg/dL (ref 0.61–1.24)
Calcium: 8.6 mg/dL — ABNORMAL LOW (ref 8.9–10.3)
Chloride: 100 mmol/L — ABNORMAL LOW (ref 101–111)
GFR calc Af Amer: >60 mL/min (ref >60)
GLUCOSE: 76 mg/dL (ref 65–99)
Potassium: 3.5 mmol/L (ref 3.5–5.1)
Sodium: 134 mmol/L — ABNORMAL LOW (ref 135–145)
TOTAL PROTEIN: 8 g/dL (ref 6.5–8.1)

## 2017-08-12 LAB — I-STAT CG4 LACTIC ACID, ED: Lactic Acid, Venous: 1.6 mmol/L (ref 0.5–1.9)

## 2017-08-12 NOTE — ED Triage Notes (Signed)
Pt reports waking up to find what he thinks was some type of insect bite on lower left abdomen just above pelvic region. Reports nausea and severe pain. Reddened, inflamed lesion present.

## 2017-08-13 ENCOUNTER — Emergency Department (HOSPITAL_COMMUNITY): Payer: Self-pay

## 2017-08-13 ENCOUNTER — Emergency Department (HOSPITAL_COMMUNITY)
Admission: EM | Admit: 2017-08-13 | Discharge: 2017-08-13 | Disposition: A | Discharge status: Home / Self Care | Payer: Self-pay | Attending: Emergency Medicine | Admitting: Emergency Medicine

## 2017-08-13 DIAGNOSIS — R1030 Lower abdominal pain, unspecified: Secondary | ICD-10-CM | POA: Insufficient documentation

## 2017-08-13 DIAGNOSIS — L03116 Cellulitis of left lower limb: Secondary | ICD-10-CM

## 2017-08-13 DIAGNOSIS — R112 Nausea with vomiting, unspecified: Secondary | ICD-10-CM | POA: Insufficient documentation

## 2017-08-13 DIAGNOSIS — M545 Low back pain, unspecified: Secondary | ICD-10-CM

## 2017-08-13 DIAGNOSIS — R0789 Other chest pain: Secondary | ICD-10-CM | POA: Insufficient documentation

## 2017-08-13 DIAGNOSIS — F17210 Nicotine dependence, cigarettes, uncomplicated: Secondary | ICD-10-CM | POA: Insufficient documentation

## 2017-08-13 LAB — URINALYSIS, ROUTINE W REFLEX MICROSCOPIC
BILIRUBIN URINE: NEGATIVE
Glucose, UA: NEGATIVE mg/dL
Hgb urine dipstick: NEGATIVE
KETONES UR: 80 mg/dL — AB
LEUKOCYTES UA: NEGATIVE
NITRITE: NEGATIVE
PH: 6 (ref 5.0–8.0)
PROTEIN: NEGATIVE mg/dL
Specific Gravity, Urine: 1.024 (ref 1.005–1.030)

## 2017-08-13 LAB — CBC
HCT: 44.9 % (ref 39.0–52.0)
HEMOGLOBIN: 15.8 g/dL (ref 13.0–17.0)
MCH: 32.6 pg (ref 26.0–34.0)
MCHC: 35.2 g/dL (ref 30.0–36.0)
MCV: 92.6 fL (ref 78.0–100.0)
PLATELETS: 227 10*3/uL (ref 150–400)
RBC: 4.85 MIL/uL (ref 4.22–5.81)
RDW: 13.2 % (ref 11.5–15.5)
WBC: 8.1 10*3/uL (ref 4.0–10.5)

## 2017-08-13 LAB — POCT I-STAT TROPONIN I: TROPONIN I, POC: 0 ng/mL (ref 0.00–0.08)

## 2017-08-13 LAB — BASIC METABOLIC PANEL
ANION GAP: 11 (ref 5–15)
BUN: 10 mg/dL (ref 6–20)
CO2: 28 mmol/L (ref 22–32)
CREATININE: 0.93 mg/dL (ref 0.61–1.24)
Calcium: 9.7 mg/dL (ref 8.9–10.3)
Chloride: 98 mmol/L — ABNORMAL LOW (ref 101–111)
GFR calc Af Amer: >60 mL/min (ref >60)
Glucose, Bld: 97 mg/dL (ref 65–99)
Potassium: 4.1 mmol/L (ref 3.5–5.1)
SODIUM: 137 mmol/L (ref 135–145)

## 2017-08-13 LAB — RAPID URINE DRUG SCREEN, HOSP PERFORMED
Amphetamines: NOT DETECTED
Barbiturates: NOT DETECTED
Benzodiazepines: NOT DETECTED
COCAINE: NOT DETECTED
Opiates: NOT DETECTED
Tetrahydrocannabinol: NOT DETECTED

## 2017-08-13 MED ORDER — IOPAMIDOL (ISOVUE-300) INJECTION 61%
INTRAVENOUS | Status: AC
  Administered 2017-08-13: 100 mL
  Filled 2017-08-13: qty 100

## 2017-08-13 MED ORDER — SODIUM CHLORIDE 0.9 % IV BOLUS (SEPSIS)
500.0000 mL | Freq: Once | INTRAVENOUS | Status: AC
  Administered 2017-08-13: 500 mL via INTRAVENOUS

## 2017-08-13 MED ORDER — LIDOCAINE 5 % EX PTCH: 1.0000 | MEDICATED_PATCH | CUTANEOUS | 0 refills | Status: DC

## 2017-08-13 MED ORDER — CEPHALEXIN 500 MG PO CAPS: 500.0000 mg | ORAL_CAPSULE | Freq: Four times a day (QID) | ORAL | 0 refills | Status: DC

## 2017-08-13 MED ORDER — ONDANSETRON HCL 4 MG/2ML IJ SOLN
4.0000 mg | Freq: Once | INTRAMUSCULAR | Status: AC
  Administered 2017-08-13: 4 mg via INTRAVENOUS
  Filled 2017-08-13: qty 2

## 2017-08-13 NOTE — ED Triage Notes (Signed)
Pt states that he has had chest pain and back pain x 2 days along with an abscess in his lower groin area. Alert and oriented. No drug use.

## 2017-08-13 NOTE — ED Notes (Signed)
Patient c/o lower back pain that radiates around to bilat lower abd quads for the past 2 hours.  Patient reports that he has had loose BMs "for while now", last BM earlier today. Denies any urinary problems.

## 2017-08-13 NOTE — Discharge Instructions (Addendum)
There were no acute or concerning abnormalities on the CT scan or your lab work. Take it easy, but do not lay around too much as this may make any stiffness worse.  Tylenol: May try taking tylenol for pain. Your daily total maximum amount of tylenol from all sources should be limited to 4000mg /day for persons without liver problems, or 2000mg /day for those with liver problems. Lidocaine patches: These are available via either prescription or over-the-counter. The over-the-counter option may be more economical one and are likely just as effective. There are multiple over-the-counter brands, such as Salonpas. Exercises: Be sure to perform the attached exercises starting with three times a week and working up to performing them daily. This is an essential part of preventing long term problems.   Follow up with a primary care provider for any future management of these complaints.

## 2017-08-13 NOTE — ED Provider Notes (Signed)
[facility]-EMERGENCY DEPT Provider Note   CSN: [id number] Arrival date & time: 08/13/17  1419     History   Chief Complaint Chief Complaint  Patient presents with  . Chest Pain  . Back Pain    HPI Carlos French is a 26 y.o. male.  HPI   Carlos French is a 26 y.o. male, with a history of GERD, GI bleed, bipolar, and substance abuse, presenting to the ED with left lower back pain beginning this morning. Pain is constant, rated 8/10, throbbing, radiating around to the left and right lower abdomen.  Nausea with one episode of vomiting this morning. Endorses diarrhea for about two weeks. Last BM was this morning. Adds that he noticed a small mass to the left inguinal region this morning. The area is red and sore to the touch. States, "I can't take ibuprofen or tylenol because they give me bleeding in my stomach." Denies illicit drug use. Denies fever/chills, dysuria/hematuria, penile discharge, testicular pain/swelling, hematochezia/melena, or any other complaints. Denies chest pain and shortness of breath.    Past Medical History:  Diagnosis Date  . Allergy   . Anxiety   . Bipolar 1 disorder (HCC)   . Depression   . GERD (gastroesophageal reflux disease)   . GI (gastrointestinal bleed)   . GI bleeding   . PTSD (post-traumatic stress disorder)     Patient Active Problem List   Diagnosis Date Noted  . Depression   . Borderline personality disorder 02/10/2016  . Substance induced mood disorder (HCC) 02/09/2016  . Alcohol withdrawal (HCC) 08/23/2015  . Tobacco use disorder 08/23/2015  . Cocaine use disorder, severe, dependence (HCC) 08/11/2015  . Alcohol use disorder, severe, dependence (HCC) 08/11/2015  . Cannabis use disorder, severe, dependence (HCC) 08/11/2015    No past surgical history on file.     Home Medications    Prior to Admission medications   Medication Sig Start Date End Date Taking? Authorizing Provider  acamprosate (CAMPRAL) 333 MG tablet Take 2  tablets (666 mg total) by mouth 3 (three) times daily with meals. Patient not taking: Reported on 05/23/2017 02/14/16   Jimmy Footman, MD  cephALEXin (KEFLEX) 500 MG capsule Take 1 capsule (500 mg total) by mouth 4 (four) times daily. 08/13/17   Deegan Valentino C, PA-C  hydrocortisone cream 0.5 % Apply topically 2 (two) times daily. Patient not taking: Reported on 05/23/2017 02/11/16   Jimmy Footman, MD  lidocaine (LIDODERM) 5 % Place 1 patch onto the skin daily. Remove & Discard patch within 12 hours or as directed by MD 08/13/17   Caylee Vlachos C, PA-C  naltrexone (DEPADE) 50 MG tablet Take 0.5 tablets (25 mg total) by mouth daily. Patient not taking: Reported on 05/23/2017 02/14/16   Jimmy Footman, MD  pantoprazole (PROTONIX) 40 MG tablet Take 1 tablet (40 mg total) by mouth daily before breakfast. Patient not taking: Reported on 05/23/2017 02/14/16   Jimmy Footman, MD  traMADol (ULTRAM) 50 MG tablet Take 1 tablet (50 mg total) by mouth every 6 (six) hours as needed. Patient not taking: Reported on 05/23/2017 06/04/16   Rebecka Apley, MD  traZODone (DESYREL) 150 MG tablet Take 1 tablet (150 mg total) by mouth at bedtime as needed for sleep. Patient not taking: Reported on 05/23/2017 02/14/16   Jimmy Footman, MD    Family History Family History  Problem Relation Age of Onset  . Stroke Mother     Social History Social History  Substance Use Topics  .  Smoking status: Current Every Day Smoker    Packs/day: 0.75    Types: Cigarettes  . Smokeless tobacco: Former Neurosurgeon  . Alcohol use Yes     Comment: 4 beer cans (24 oz) every day      Allergies   Patient has no known allergies.   Review of Systems Review of Systems  Constitutional: Negative for chills, diaphoresis and fever.  Respiratory: Negative for shortness of breath.   Cardiovascular: Negative for chest pain.  Gastrointestinal: Positive for abdominal pain, diarrhea, nausea and  vomiting. Negative for blood in stool.  Genitourinary: Negative for discharge, dysuria, hematuria, scrotal swelling and testicular pain.  Musculoskeletal: Positive for back pain.  All other systems reviewed and are negative.    Physical Exam Updated Vital Signs BP 137/90 (BP Location: Left Arm)   Pulse 93   Temp 98.6 F (37 C) (Oral)   Resp 17   Ht 5\' 6"  (1.676 m)   Wt 54.4 kg (120 lb)   SpO2 100%   BMI 19.37 kg/m   Physical Exam  Constitutional: He appears well-developed and well-nourished. No distress.  HENT:  Head: Normocephalic and atraumatic.  Eyes: Conjunctivae are normal.  Neck: Neck supple.  Cardiovascular: Normal rate, regular rhythm, normal heart sounds and intact distal pulses.   Pulmonary/Chest: Effort normal and breath sounds normal. No respiratory distress.  Abdominal: Soft. There is tenderness in the right lower quadrant and left lower quadrant. There is no guarding and no CVA tenderness. No hernia.    Genitourinary:  Genitourinary Comments: Penis, scrotum, and testicles without swelling, lesions, or tenderness. No penile discharge.  No inguinal hernia noted. Cremasteric reflex intact. Overall normal male genitalia.   Musculoskeletal: He exhibits tenderness. He exhibits no edema.  Tenderness at the bilateral posterior iliac crests. Normal motor function intact in lower extremities and spine. No midline spinal tenderness. Full ROM in the bilateral hips.   Lymphadenopathy:    He has no cervical adenopathy.       Right: No inguinal adenopathy present.       Left: No inguinal adenopathy present.  Neurological: He is alert.  No noted sensory deficits in the lower extremities. Strength in lower extremities 5/5. No gait abnormality. Coordination intact.  Skin: Skin is warm and dry. He is not diaphoretic.  Psychiatric: He has a normal mood and affect. His behavior is normal.  Nursing note and vitals reviewed.    ED Treatments / Results  Labs (all labs ordered  are listed, but only abnormal results are displayed) Labs Reviewed  BASIC METABOLIC PANEL - Abnormal; Notable for the following:       Result Value   Chloride 98 (*)    All other components within normal limits  URINALYSIS, ROUTINE W REFLEX MICROSCOPIC - Abnormal; Notable for the following:    Ketones, ur 80 (*)    All other components within normal limits  CBC  RAPID URINE DRUG SCREEN, HOSP PERFORMED  I-STAT TROPONIN, ED  POCT I-STAT TROPONIN I    EKG  EKG Interpretation  Date/Time:  Monday August 13 2017 14:47:14 EDT Ventricular Rate:  82 PR Interval:    QRS Duration: 102 QT Interval:  359 QTC Calculation: 420 R Axis:   81 Text Interpretation:  Sinus rhythm RSR' in V1 or V2, right VCD or RVH ST elev, probable normal early repol pattern Baseline wander in lead(s) III aVL no significant change compared to 2017 Confirmed by Pricilla Loveless [phone]) on 08/13/2017 7:00:08 PM  Radiology Dg Chest 2 View  Result Date: 08/13/2017 CLINICAL DATA:  Productive cough . EXAM: CHEST  2 VIEW COMPARISON:  05/02/2016 . FINDINGS: Mediastinum and hilar structures normal. Lungs are clear. No focal infiltrate. No pleural effusion pneumothorax. Heart size normal. Mild thoracic spine scoliosis. IMPRESSION: No acute cardiopulmonary disease . Electronically Signed   By: Maisie Fus  Register   On: 08/13/2017 14:56   Ct Abdomen Pelvis W Contrast  Result Date: 08/13/2017 CLINICAL DATA:  Low back pain radiates to bilateral lower abdominal region. Two hour duration. EXAM: CT ABDOMEN AND PELVIS WITH CONTRAST TECHNIQUE: Multidetector CT imaging of the abdomen and pelvis was performed using the standard protocol following bolus administration of intravenous contrast. CONTRAST:  ISOVUE-300 IOPAMIDOL (ISOVUE-300) INJECTION 61% COMPARISON:  10/28/2013 FINDINGS: Lower chest:  Unremarkable. Hepatobiliary: No focal abnormality within the liver parenchyma. There is no evidence for gallstones, gallbladder wall  thickening, or pericholecystic fluid. No intrahepatic or extrahepatic biliary dilation. Pancreas: No focal mass lesion. No dilatation of the main duct. No intraparenchymal cyst. No peripancreatic edema. Spleen: No splenomegaly. No focal mass lesion. Adrenals/Urinary Tract: No adrenal nodule or mass. Kidneys are unremarkable. No evidence for hydroureter. The urinary bladder appears normal for the degree of distention. Stomach/Bowel: Stomach is nondistended. No gastric wall thickening. No evidence of outlet obstruction. Duodenum is normally positioned as is the ligament of Treitz. No small bowel wall thickening. No small bowel dilatation. The terminal ileum is normal. The appendix is normal. No gross colonic mass. No colonic wall thickening. No substantial diverticular change. Vascular/Lymphatic: No abdominal aortic aneurysm. There is no gastrohepatic or hepatoduodenal ligament lymphadenopathy. No intraperitoneal or retroperitoneal lymphadenopathy. No pelvic sidewall lymphadenopathy. Reproductive: The prostate gland and seminal vesicles have normal imaging features. Other: No intraperitoneal free fluid. Musculoskeletal: Bone windows reveal no worrisome lytic or sclerotic osseous lesions. IMPRESSION: 1. No acute findings in the abdomen or pelvis. Specifically, no findings to explain the patient's history of back and bilateral lower abdominal pain. Electronically Signed   By: Kennith Center M.D.   On: 08/13/2017 20:59    Procedures Procedures (including critical care time)  Medications Ordered in ED Medications  sodium chloride 0.9 % bolus 500 mL (0 mLs Intravenous Stopped 08/13/17 2020)  ondansetron (ZOFRAN) injection 4 mg (4 mg Intravenous Given 08/13/17 2020)  iopamidol (ISOVUE-300) 61 % injection (100 mLs  Contrast Given 08/13/17 2034)     Initial Impression / Assessment and Plan / ED Course  I have reviewed the triage vital signs and the nursing notes.  Pertinent labs & imaging results that were  available during my care of the patient were reviewed by me and considered in my medical decision making (see chart for details).      atient presents with bilateral lower back pain extending into the bilateral lower abdomen. Patient is nontoxic appearing, afebrile, not tachycardic, not tachypneic, not hypotensive, and is in no apparent distress. No leukocytosis. No abnormalities in UA. No acute abnormalities on CT. Suspect small area of cellulitis in the left groin, which was addressed. PCP follow-up. Resources given. The patient was given instructions for home care as well as return precautions. Patient voices understanding of these instructions, accepts the plan, and is comfortable with discharge.   Vitals:   08/13/17 1441 08/13/17 1856 08/13/17 1857 08/13/17 2136  BP: 136/86 137/90  133/89  Pulse: 92 93  80  Resp: 16 17  16   Temp: 98.6 F (37 C)     TempSrc: Oral     SpO2: 99%  100%  95%  Weight:   54.4 kg (120 lb)   Height:   5\' 6"  (1.676 m)      Final Clinical Impressions(s) / ED Diagnoses   Final diagnoses:  Acute bilateral low back pain without sciatica  Cellulitis of left lower extremity    New Prescriptions Discharge Medication List as of 08/13/2017  9:19 PM    START taking these medications   Details  cephALEXin (KEFLEX) 500 MG capsule Take 1 capsule (500 mg total) by mouth 4 (four) times daily., Starting Mon 08/13/2017, Print    lidocaine (LIDODERM) 5 % Place 1 patch onto the skin daily. Remove & Discard patch within 12 hours or as directed by MD, Starting Mon 08/13/2017, Print         Sariya Trickey C, PA-C 08/13/17 2253    Pricilla Loveless, MD 08/15/17 2325

## 2017-10-25 ENCOUNTER — Emergency Department (HOSPITAL_COMMUNITY)
Admission: EM | Admit: 2017-10-25 | Discharge: 2017-10-25 | Disposition: A | Discharge status: Home / Self Care | Payer: Medicaid Other | Attending: Emergency Medicine | Admitting: Emergency Medicine

## 2017-10-25 ENCOUNTER — Encounter (HOSPITAL_COMMUNITY): Payer: Self-pay | Admitting: Emergency Medicine

## 2017-10-25 ENCOUNTER — Emergency Department (HOSPITAL_COMMUNITY): Payer: Medicaid Other

## 2017-10-25 DIAGNOSIS — R1013 Epigastric pain: Secondary | ICD-10-CM

## 2017-10-25 DIAGNOSIS — Z79899 Other long term (current) drug therapy: Secondary | ICD-10-CM | POA: Insufficient documentation

## 2017-10-25 DIAGNOSIS — K2950 Unspecified chronic gastritis without bleeding: Secondary | ICD-10-CM

## 2017-10-25 DIAGNOSIS — K2970 Gastritis, unspecified, without bleeding: Secondary | ICD-10-CM | POA: Insufficient documentation

## 2017-10-25 DIAGNOSIS — F17210 Nicotine dependence, cigarettes, uncomplicated: Secondary | ICD-10-CM | POA: Insufficient documentation

## 2017-10-25 LAB — POC OCCULT BLOOD, ED: FECAL OCCULT BLD: NEGATIVE

## 2017-10-25 LAB — TYPE AND SCREEN
ABO/RH(D): O POS
ANTIBODY SCREEN: NEGATIVE

## 2017-10-25 LAB — COMPREHENSIVE METABOLIC PANEL
ALK PHOS: 83 U/L (ref 38–126)
ALT: 32 U/L (ref 17–63)
ANION GAP: 10 (ref 5–15)
AST: 30 U/L (ref 15–41)
Albumin: 4.2 g/dL (ref 3.5–5.0)
BILIRUBIN TOTAL: 0.5 mg/dL (ref 0.3–1.2)
BUN: 5 mg/dL — ABNORMAL LOW (ref 6–20)
CALCIUM: 9.4 mg/dL (ref 8.9–10.3)
CO2: 27 mmol/L (ref 22–32)
Chloride: 100 mmol/L — ABNORMAL LOW (ref 101–111)
Creatinine, Ser: 0.96 mg/dL (ref 0.61–1.24)
Glucose, Bld: 113 mg/dL — ABNORMAL HIGH (ref 65–99)
Potassium: 3.8 mmol/L (ref 3.5–5.1)
Sodium: 137 mmol/L (ref 135–145)
TOTAL PROTEIN: 7.9 g/dL (ref 6.5–8.1)

## 2017-10-25 LAB — CBC
HCT: 47 % (ref 39.0–52.0)
HEMOGLOBIN: 16.4 g/dL (ref 13.0–17.0)
MCH: 32.7 pg (ref 26.0–34.0)
MCHC: 34.9 g/dL (ref 30.0–36.0)
MCV: 93.6 fL (ref 78.0–100.0)
PLATELETS: 259 10*3/uL (ref 150–400)
RBC: 5.02 MIL/uL (ref 4.22–5.81)
RDW: 12.8 % (ref 11.5–15.5)
WBC: 7.7 10*3/uL (ref 4.0–10.5)

## 2017-10-25 LAB — ABO/RH: ABO/RH(D): O POS

## 2017-10-25 MED ORDER — GI COCKTAIL ~~LOC~~
30.0000 mL | Freq: Once | ORAL | Status: AC
  Administered 2017-10-25: 30 mL via ORAL
  Filled 2017-10-25: qty 30

## 2017-10-25 MED ORDER — ONDANSETRON 4 MG PO TBDP
ORAL_TABLET | ORAL | Status: AC
  Administered 2017-10-25: 4 mg
  Filled 2017-10-25: qty 1

## 2017-10-25 MED ORDER — FAMOTIDINE 20 MG PO TABS: 20.0000 mg | ORAL_TABLET | Freq: Two times a day (BID) | ORAL | 0 refills | Status: DC

## 2017-10-25 MED ORDER — SUCRALFATE 1 GM/10ML PO SUSP: 1.0000 g | Freq: Three times a day (TID) | ORAL | 0 refills | Status: DC

## 2017-10-25 MED ORDER — SODIUM CHLORIDE 0.9 % IV BOLUS (SEPSIS)
1000.0000 mL | Freq: Once | INTRAVENOUS | Status: AC
  Administered 2017-10-25: 1000 mL via INTRAVENOUS

## 2017-10-25 MED ORDER — ONDANSETRON 4 MG PO TBDP
ORAL_TABLET | ORAL | Status: AC
  Filled 2017-10-25: qty 1

## 2017-10-25 MED ORDER — PANTOPRAZOLE SODIUM 20 MG PO TBEC: 20.0000 mg | DELAYED_RELEASE_TABLET | Freq: Every day | ORAL | 0 refills | Status: DC

## 2017-10-25 MED ORDER — ONDANSETRON 4 MG PO TBDP: 4.0000 mg | ORAL_TABLET | Freq: Three times a day (TID) | ORAL | 0 refills | Status: DC | PRN

## 2017-10-25 NOTE — ED Provider Notes (Signed)
[facility] EMERGENCY DEPARTMENT Provider Note   CSN: [id number] Arrival date & time: 10/25/17  1345     History   Chief Complaint Chief Complaint  Patient presents with  . GI Bleeding  . Emesis    HPI Carlos French is a 26 y.o. male with past medical history of tobacco use, prior GI bleed, who presents to ED for evaluation of one episode of hematemesis and one episode of hematochezia today.  He states that he has had daily nausea and vomiting after every meal he eats for the past several weeks.  However, he woke up today and before breakfast he had one episode of hematemesis.  He also reports progressive weight loss, generalized fatigue and low energy.  He also reports intermittent abdominal pain that comes with his nausea.  He has never underwent and an endoscopy, colonoscopy or abdominal surgery in the past.  He denies any changes in diet, recent travel, hematuria, fevers, lightheadedness or loss of consciousness, chest pain, shortness of breath.  HPI  Past Medical History:  Diagnosis Date  . Allergy   . Anxiety   . Bipolar 1 disorder (HCC)   . Depression   . GERD (gastroesophageal reflux disease)   . GI (gastrointestinal bleed)   . GI bleeding   . PTSD (post-traumatic stress disorder)     Patient Active Problem List   Diagnosis Date Noted  . Depression   . Borderline personality disorder (HCC) 02/10/2016  . Substance induced mood disorder (HCC) 02/09/2016  . Alcohol withdrawal (HCC) 08/23/2015  . Tobacco use disorder 08/23/2015  . Cocaine use disorder, severe, dependence (HCC) 08/11/2015  . Alcohol use disorder, severe, dependence (HCC) 08/11/2015  . Cannabis use disorder, severe, dependence (HCC) 08/11/2015    History reviewed. No pertinent surgical history.     Home Medications    Prior to Admission medications   Medication Sig Start Date End Date Taking? Authorizing Provider  calcium carbonate (TUMS - DOSED IN MG ELEMENTAL CALCIUM) 500  MG chewable tablet Chew 1 tablet by mouth 4 (four) times daily as needed for indigestion or heartburn.   Yes [provider]  acamprosate (CAMPRAL) 333 MG tablet Take 2 tablets (666 mg total) by mouth 3 (three) times daily with meals. 02/14/16   Jimmy Footman, MD  cephALEXin (KEFLEX) 500 MG capsule Take 1 capsule (500 mg total) by mouth 4 (four) times daily. 08/13/17   Joy, Shawn C, PA-C  famotidine (PEPCID) 20 MG tablet Take 1 tablet (20 mg total) by mouth 2 (two) times daily. 10/25/17   Kirk Sampley, PA-C  hydrocortisone cream 0.5 % Apply topically 2 (two) times daily. 02/11/16   Jimmy Footman, MD  lidocaine (LIDODERM) 5 % Place 1 patch onto the skin daily. Remove & Discard patch within 12 hours or as directed by MD 08/13/17   Joy, Shawn C, PA-C  naltrexone (DEPADE) 50 MG tablet Take 0.5 tablets (25 mg total) by mouth daily. 02/14/16   Jimmy Footman, MD  ondansetron (ZOFRAN ODT) 4 MG disintegrating tablet Take 1 tablet (4 mg total) by mouth every 8 (eight) hours as needed for nausea or vomiting. 10/25/17   Atari Novick, PA-C  pantoprazole (PROTONIX) 20 MG tablet Take 1 tablet (20 mg total) by mouth daily. 10/25/17   Graiden Henes, PA-C  sucralfate (CARAFATE) 1 GM/10ML suspension Take 10 mLs (1 g total) by mouth 4 (four) times daily -  with meals and at bedtime. 10/25/17   Sruti Ayllon, PA-C  traMADol (ULTRAM) 50  MG tablet Take 1 tablet (50 mg total) by mouth every 6 (six) hours as needed. 06/04/16   Rebecka Apley, MD  traZODone (DESYREL) 150 MG tablet Take 1 tablet (150 mg total) by mouth at bedtime as needed for sleep. 02/14/16   Jimmy Footman, MD    Family History Family History  Problem Relation Age of Onset  . Stroke Mother     Social History Social History  Substance Use Topics  . Smoking status: Current Every Day Smoker    Packs/day: 1.00    Types: Cigarettes  . Smokeless tobacco: Former Neurosurgeon  . Alcohol use Yes     Comment:  "maybe twice a week"     Allergies   Patient has no known allergies.   Review of Systems Review of Systems  Constitutional: Positive for appetite change, fatigue and unexpected weight change. Negative for chills and fever.  HENT: Negative for ear pain, rhinorrhea, sneezing and sore throat.   Eyes: Negative for photophobia and visual disturbance.  Respiratory: Negative for cough, chest tightness, shortness of breath and wheezing.   Cardiovascular: Negative for chest pain and palpitations.  Gastrointestinal: Positive for blood in stool, diarrhea, nausea and vomiting. Negative for abdominal pain and constipation.  Genitourinary: Negative for dysuria, hematuria and urgency.  Musculoskeletal: Negative for myalgias.  Skin: Negative for rash.  Neurological: Negative for dizziness, weakness and light-headedness.     Physical Exam Updated Vital Signs BP 123/80   Pulse 64   Temp 98.7 F (37.1 C) (Oral)   Resp 16   Ht 5\' 7"  (1.702 m)   Wt 51.1 kg (112 lb 11.2 oz)   SpO2 100%   BMI 17.65 kg/m   Physical Exam  Constitutional: He appears well-developed and well-nourished. No distress.  HENT:  Head: Normocephalic and atraumatic.  Nose: Nose normal.  Eyes: Conjunctivae and EOM are normal. Left eye exhibits no discharge. No scleral icterus.  Neck: Normal range of motion. Neck supple.  Cardiovascular: Normal rate, regular rhythm, normal heart sounds and intact distal pulses.  Exam reveals no gallop and no friction rub.   No murmur heard. Pulmonary/Chest: Effort normal and breath sounds normal. No respiratory distress.  Abdominal: Soft. Bowel sounds are normal. He exhibits no distension. There is no tenderness. There is no guarding.  Genitourinary: Rectum normal. Rectal exam shows no external hemorrhoid, no internal hemorrhoid, no fissure, no mass, no tenderness and guaiac negative stool.  Musculoskeletal: Normal range of motion. He exhibits no edema.  Neurological: He is alert. He  exhibits normal muscle tone. Coordination normal.  Skin: Skin is warm and dry. No rash noted.  Psychiatric: He has a normal mood and affect.  Nursing note and vitals reviewed.    ED Treatments / Results  Labs (all labs ordered are listed, but only abnormal results are displayed) Labs Reviewed  COMPREHENSIVE METABOLIC PANEL - Abnormal; Notable for the following:       Result Value   Chloride 100 (*)    Glucose, Bld 113 (*)    BUN 5 (*)    All other components within normal limits  CBC  POC OCCULT BLOOD, ED  TYPE AND SCREEN  ABO/RH    EKG  EKG Interpretation None       Radiology Dg Abd Acute W/chest  Result Date: 10/25/2017 CLINICAL DATA:  Acute abdominal pain and vomiting today. EXAM: DG ABDOMEN ACUTE W/ 1V CHEST COMPARISON:  None. FINDINGS: The cardiomediastinal silhouette is unremarkable. The lungs are clear. There is no evidence of  airspace disease, pleural effusion or pneumothorax. Mildly distended fluid-filled stomach noted. There is no evidence of bowel obstruction or dilated bowel loops. There is no evidence of pneumoperitoneum or suspicious calcifications. IMPRESSION: Mildly distended fluid-filled stomach without other bowel abnormality identified. No evidence of acute cardiopulmonary disease. Electronically Signed   By: Harmon PierJeffrey  Hu M.D.   On: 10/25/2017 17:48    Procedures Procedures (including critical care time)  Medications Ordered in ED Medications  ondansetron (ZOFRAN-ODT) 4 MG disintegrating tablet (not administered)  gi cocktail (Maalox,Lidocaine,Donnatal) (not administered)  ondansetron (ZOFRAN-ODT) 4 MG disintegrating tablet (4 mg  Given 10/25/17 1413)  sodium chloride 0.9 % bolus 1,000 mL (1,000 mLs Intravenous New Bag/Given 10/25/17 1834)  sodium chloride 0.9 % bolus 1,000 mL (1,000 mLs Intravenous New Bag/Given 10/25/17 1922)     Initial Impression / Assessment and Plan / ED Course  I have reviewed the triage vital signs and the nursing  notes.  Pertinent labs & imaging results that were available during my care of the patient were reviewed by me and considered in my medical decision making (see chart for details).     Patient presents to ED for evaluation of one episode of hematemesis and one episode of hematochezia today.  He also reports daily nausea and vomiting after every meal he eats for the past several weeks.  He has not taken any medications to help with symptoms.  He does have a history of GI bleed in the past but has never seen or been evaluated by a GI specialist or had endoscopy or colonoscopy done.  On physical exam patient is nontoxic-appearing and in no acute distress.  He has no abdominal tenderness to palpation on my exam.  Does not appear dehydrated.  Lab work including CBC, CMP unremarkable.  Stool is Hemoccult negative.  No signs of hemorrhoids or fissures noted during visual examination.  Hemoglobin is stable at 16.  He is not hypotensive or tachycardic.  He reports improvement in his symptoms with fluids and GI cocktail.  I encouraged him that he should probably follow-up with a GI specialist for any further imaging and management.  He states that he was unable to do so in the past due to lack of insurance.  However he states that he is now under Medicaid.  I have low suspicion for acute surgical cause such as appendicitis or cholecystitis being the cause of his symptoms based on his lack of abdominal tenderness to palpation and lab work.  We will give acid reducing medications and Zofran to be taken at home and information first follow-up with GI specialist.  Patient appears stable for discharge at this time.  Strict return precautions given.  Final Clinical Impressions(s) / ED Diagnoses   Final diagnoses:  Epigastric abdominal pain  Chronic gastritis without bleeding, unspecified gastritis type    New Prescriptions New Prescriptions   FAMOTIDINE (PEPCID) 20 MG TABLET    Take 1 tablet (20 mg total) by mouth  2 (two) times daily.   ONDANSETRON (ZOFRAN ODT) 4 MG DISINTEGRATING TABLET    Take 1 tablet (4 mg total) by mouth every 8 (eight) hours as needed for nausea or vomiting.   PANTOPRAZOLE (PROTONIX) 20 MG TABLET    Take 1 tablet (20 mg total) by mouth daily.   SUCRALFATE (CARAFATE) 1 GM/10ML SUSPENSION    Take 10 mLs (1 g total) by mouth 4 (four) times daily -  with meals and at bedtime.     Dietrich PatesKhatri, Azul Brumett, PA-C 10/25/17 2035  Melene Plan, DO 10/25/17 2046

## 2017-10-25 NOTE — ED Notes (Signed)
Pt to xray

## 2017-10-25 NOTE — ED Triage Notes (Signed)
Pt reports vomiting "a big blood clot" today, 1 episode, reports blood in stool today. Pt reports hx of same, reports recent weight loss, states, "Everytime I eat I vomit." Pt reports referred to GI, unable to follow up  D/t insurance.

## 2017-10-25 NOTE — Discharge Instructions (Signed)
Please read attached information regarding your condition. Take Carafate, Protonix and Pepcid as needed for reflux. Follow-up with GI specialist listed below for further evaluation. Continue other home medications as previously prescribed. Return to ED for worsening vomiting, lightheadedness, severe worsening stomach pains, loss of consciousness.

## 2017-11-02 ENCOUNTER — Other Ambulatory Visit: Payer: Self-pay

## 2017-11-02 ENCOUNTER — Encounter (HOSPITAL_COMMUNITY): Payer: Self-pay

## 2017-11-02 ENCOUNTER — Emergency Department (HOSPITAL_COMMUNITY)
Admission: EM | Admit: 2017-11-02 | Discharge: 2017-11-03 | Disposition: A | Discharge status: Other Acute Inpatient Hospital | Payer: Medicaid Other | Attending: Emergency Medicine | Admitting: Emergency Medicine

## 2017-11-02 DIAGNOSIS — Z79899 Other long term (current) drug therapy: Secondary | ICD-10-CM | POA: Insufficient documentation

## 2017-11-02 DIAGNOSIS — F329 Major depressive disorder, single episode, unspecified: Secondary | ICD-10-CM | POA: Insufficient documentation

## 2017-11-02 DIAGNOSIS — F10920 Alcohol use, unspecified with intoxication, uncomplicated: Secondary | ICD-10-CM

## 2017-11-02 DIAGNOSIS — F333 Major depressive disorder, recurrent, severe with psychotic symptoms: Secondary | ICD-10-CM | POA: Diagnosis present

## 2017-11-02 DIAGNOSIS — R45851 Suicidal ideations: Secondary | ICD-10-CM | POA: Insufficient documentation

## 2017-11-02 DIAGNOSIS — R45850 Homicidal ideations: Secondary | ICD-10-CM | POA: Insufficient documentation

## 2017-11-02 DIAGNOSIS — F10929 Alcohol use, unspecified with intoxication, unspecified: Secondary | ICD-10-CM | POA: Insufficient documentation

## 2017-11-02 DIAGNOSIS — Z046 Encounter for general psychiatric examination, requested by authority: Secondary | ICD-10-CM | POA: Insufficient documentation

## 2017-11-02 DIAGNOSIS — F17210 Nicotine dependence, cigarettes, uncomplicated: Secondary | ICD-10-CM | POA: Insufficient documentation

## 2017-11-02 DIAGNOSIS — F919 Conduct disorder, unspecified: Secondary | ICD-10-CM | POA: Insufficient documentation

## 2017-11-02 LAB — COMPREHENSIVE METABOLIC PANEL
ALBUMIN: 4.4 g/dL (ref 3.5–5.0)
ALK PHOS: 87 U/L (ref 38–126)
ALT: 41 U/L (ref 17–63)
ANION GAP: 11 (ref 5–15)
AST: 43 U/L — ABNORMAL HIGH (ref 15–41)
BUN: 6 mg/dL (ref 6–20)
CHLORIDE: 96 mmol/L — AB (ref 101–111)
CO2: 25 mmol/L (ref 22–32)
Calcium: 9.1 mg/dL (ref 8.9–10.3)
Creatinine, Ser: 0.83 mg/dL (ref 0.61–1.24)
GFR calc non Af Amer: >60 mL/min (ref >60)
GLUCOSE: 86 mg/dL (ref 65–99)
POTASSIUM: 3.3 mmol/L — AB (ref 3.5–5.1)
SODIUM: 132 mmol/L — AB (ref 135–145)
Total Bilirubin: 0.3 mg/dL (ref 0.3–1.2)
Total Protein: 8.1 g/dL (ref 6.5–8.1)

## 2017-11-02 LAB — ETHANOL: Alcohol, Ethyl (B): 303 mg/dL (ref <10)

## 2017-11-02 LAB — CBC
HEMATOCRIT: 46.7 % (ref 39.0–52.0)
Hemoglobin: 16.5 g/dL (ref 13.0–17.0)
MCH: 32.9 pg (ref 26.0–34.0)
MCHC: 35.3 g/dL (ref 30.0–36.0)
MCV: 93.2 fL (ref 78.0–100.0)
PLATELETS: 295 10*3/uL (ref 150–400)
RBC: 5.01 MIL/uL (ref 4.22–5.81)
RDW: 13.1 % (ref 11.5–15.5)
WBC: 12.3 10*3/uL — AB (ref 4.0–10.5)

## 2017-11-02 LAB — RAPID URINE DRUG SCREEN, HOSP PERFORMED
Amphetamines: NOT DETECTED
BARBITURATES: NOT DETECTED
BENZODIAZEPINES: NOT DETECTED
COCAINE: NOT DETECTED
Opiates: NOT DETECTED
Tetrahydrocannabinol: NOT DETECTED

## 2017-11-02 LAB — ACETAMINOPHEN LEVEL

## 2017-11-02 LAB — SALICYLATE LEVEL: Salicylate Lvl: <7 mg/dL (ref 2.8–30.0)

## 2017-11-02 MED ORDER — POTASSIUM CHLORIDE CRYS ER 20 MEQ PO TBCR
40.0000 meq | EXTENDED_RELEASE_TABLET | Freq: Once | ORAL | Status: AC
  Administered 2017-11-02: 40 meq via ORAL
  Filled 2017-11-02: qty 2

## 2017-11-02 MED ORDER — ZIPRASIDONE MESYLATE 20 MG IM SOLR
20.0000 mg | Freq: Once | INTRAMUSCULAR | Status: AC
  Administered 2017-11-03: 20 mg via INTRAMUSCULAR
  Filled 2017-11-02: qty 20

## 2017-11-02 NOTE — ED Triage Notes (Addendum)
Pt brought in by sheriff after being told by his gf he has to come to the ER for suicidal thoughts. Pt states "I've been suicidal for a week" Pt states "I have a plan but I am not telling you what it is" Heavy etoh use. Pt denies drug use. Pt refusing to get in scrubs.

## 2017-11-02 NOTE — ED Provider Notes (Signed)
[facility] EMERGENCY DEPARTMENT Provider Note   CSN: [id number] Arrival date & time: 11/02/17  2224     History   Chief Complaint Chief Complaint  Patient presents with  . Suicidal    HPI Carlos French is a 26 y.o. male.  Patient is a 26 year old male with history of depression brought by authorities for evaluation of making suicidal threats to his girlfriend.  The patient admits to me that he is suicidal and will "blow his head off with a gun" if allowed to leave.  He reports alcohol consumption this evening, but denies any illicit drug use.  He does report a history of suicidal ideation and reports one time in the past placing a gun in his mouth and pulling the trigger, however it did not fire because it was jammed.  He also admits to homicidal ideation, however refuses to elaborate further.  This is the extent of the history that he will offer to me.  He becomes upset and angry when I ask more personal questions.   The history is provided by the patient.    Past Medical History:  Diagnosis Date  . Allergy   . Anxiety   . Bipolar 1 disorder (HCC)   . Depression   . GERD (gastroesophageal reflux disease)   . GI (gastrointestinal bleed)   . GI bleeding   . PTSD (post-traumatic stress disorder)     Patient Active Problem List   Diagnosis Date Noted  . Depression   . Borderline personality disorder (HCC) 02/10/2016  . Substance induced mood disorder (HCC) 02/09/2016  . Alcohol withdrawal (HCC) 08/23/2015  . Tobacco use disorder 08/23/2015  . Cocaine use disorder, severe, dependence (HCC) 08/11/2015  . Alcohol use disorder, severe, dependence (HCC) 08/11/2015  . Cannabis use disorder, severe, dependence (HCC) 08/11/2015    History reviewed. No pertinent surgical history.     Home Medications    Prior to Admission medications   Medication Sig Start Date End Date Taking? Authorizing Provider  acamprosate (CAMPRAL) 333 MG tablet Take 2  tablets (666 mg total) by mouth 3 (three) times daily with meals. 02/14/16   Jimmy Footman, MD  calcium carbonate (TUMS - DOSED IN MG ELEMENTAL CALCIUM) 500 MG chewable tablet Chew 1 tablet by mouth 4 (four) times daily as needed for indigestion or heartburn.    [provider]  cephALEXin (KEFLEX) 500 MG capsule Take 1 capsule (500 mg total) by mouth 4 (four) times daily. 08/13/17   Joy, Shawn C, PA-C  famotidine (PEPCID) 20 MG tablet Take 1 tablet (20 mg total) by mouth 2 (two) times daily. 10/25/17   Khatri, Hina, PA-C  hydrocortisone cream 0.5 % Apply topically 2 (two) times daily. 02/11/16   Jimmy Footman, MD  lidocaine (LIDODERM) 5 % Place 1 patch onto the skin daily. Remove & Discard patch within 12 hours or as directed by MD 08/13/17   Joy, Shawn C, PA-C  naltrexone (DEPADE) 50 MG tablet Take 0.5 tablets (25 mg total) by mouth daily. 02/14/16   Jimmy Footman, MD  ondansetron (ZOFRAN ODT) 4 MG disintegrating tablet Take 1 tablet (4 mg total) by mouth every 8 (eight) hours as needed for nausea or vomiting. 10/25/17   Khatri, Hina, PA-C  pantoprazole (PROTONIX) 20 MG tablet Take 1 tablet (20 mg total) by mouth daily. 10/25/17   Khatri, Hina, PA-C  sucralfate (CARAFATE) 1 GM/10ML suspension Take 10 mLs (1 g total) by mouth 4 (four) times daily -  with meals and at bedtime. 10/25/17   Khatri, Hina, PA-C  traMADol (ULTRAM) 50 MG tablet Take 1 tablet (50 mg total) by mouth every 6 (six) hours as needed. 06/04/16   Rebecka ApleyWebster, Allison P, MD  traZODone (DESYREL) 150 MG tablet Take 1 tablet (150 mg total) by mouth at bedtime as needed for sleep. 02/14/16   Jimmy FootmanHernandez-Gonzalez, Andrea, MD    Family History Family History  Problem Relation Age of Onset  . Stroke Mother     Social History Social History   Tobacco Use  . Smoking status: Current Every Day Smoker    Packs/day: 1.00    Types: Cigarettes  . Smokeless tobacco: Former Engineer, waterUser  Substance Use Topics  .  Alcohol use: Yes    Comment: "maybe twice a week"  . Drug use: Yes    Types: Marijuana, Cocaine    Comment: "over a year"     Allergies   Patient has no known allergies.   Review of Systems Review of Systems  Unable to perform ROS: Psychiatric disorder     Physical Exam Updated Vital Signs BP 112/86 (BP Location: Right Arm)   Pulse 100   Temp 98.5 F (36.9 C) (Oral)   Resp 18   Ht 5\' 7"  (1.702 m)   Wt 50.8 kg (112 lb)   SpO2 95%   BMI 17.54 kg/m   Physical Exam  Constitutional: He is oriented to person, place, and time. He appears well-developed and well-nourished. No distress.  HENT:  Head: Normocephalic and atraumatic.  Mouth/Throat: Oropharynx is clear and moist.  Neck: Normal range of motion. Neck supple.  Cardiovascular: Normal rate and regular rhythm. Exam reveals no friction rub.  No murmur heard. Pulmonary/Chest: Effort normal and breath sounds normal. No respiratory distress. He has no wheezes. He has no rales.  Abdominal: Soft. Bowel sounds are normal. He exhibits no distension. There is no tenderness.  Musculoskeletal: Normal range of motion. He exhibits no edema.  Neurological: He is alert and oriented to person, place, and time. Coordination normal.  Skin: Skin is warm and dry. He is not diaphoretic.  Psychiatric: His affect is angry and labile. His speech is rapid and/or pressured. He is agitated, aggressive and combative. Cognition and memory are normal. He expresses impulsivity and inappropriate judgment. He expresses homicidal and suicidal ideation. He expresses suicidal plans.  Nursing note and vitals reviewed.    ED Treatments / Results  Labs (all labs ordered are listed, but only abnormal results are displayed) Labs Reviewed  COMPREHENSIVE METABOLIC PANEL - Abnormal; Notable for the following components:      Result Value   Sodium 132 (*)    Potassium 3.3 (*)    Chloride 96 (*)    AST 43 (*)    All other components within normal limits    ETHANOL - Abnormal; Notable for the following components:   Alcohol, Ethyl (B) 303 (*)    All other components within normal limits  ACETAMINOPHEN LEVEL - Abnormal; Notable for the following components:   Acetaminophen (Tylenol), Serum <10 (*)    All other components within normal limits  CBC - Abnormal; Notable for the following components:   WBC 12.3 (*)    All other components within normal limits  SALICYLATE LEVEL  RAPID URINE DRUG SCREEN, HOSP PERFORMED  CBG MONITORING, ED    EKG  EKG Interpretation None       Radiology No results found.  Procedures Procedures (including critical care time)  Medications Ordered in ED  Medications  ziprasidone (GEODON) injection 20 mg (not administered)  potassium chloride SA (K-DUR,KLOR-CON) CR tablet 40 mEq (not administered)     Initial Impression / Assessment and Plan / ED Course  I have reviewed the triage vital signs and the nursing notes.  Pertinent labs & imaging results that were available during my care of the patient were reviewed by me and considered in my medical decision making (see chart for details).  Patient presents with suicidal ideation with plan.  He arrives here intoxicated, belligerent, and uncooperative.  He required Geodon for sedation as he represented a danger to himself and the other patients in his immediate vicinity.  He informed me that he wanted to go home and "blow his head off".  For this reason, he was made and involuntary commitment.  He was evaluated by CTS and felt to meet inpatient criteria.  A bed search is underway.  Final Clinical Impressions(s) / ED Diagnoses   Final diagnoses:  None    ED Discharge Orders    None       Geoffery Lyonselo, Toula Miyasaki, MD 11/03/17 [phone]

## 2017-11-03 ENCOUNTER — Encounter (HOSPITAL_COMMUNITY): Payer: Self-pay | Admitting: *Deleted

## 2017-11-03 ENCOUNTER — Other Ambulatory Visit: Payer: Self-pay

## 2017-11-03 ENCOUNTER — Inpatient Hospital Stay (HOSPITAL_COMMUNITY)
Admission: AD | Admit: 2017-11-03 | Discharge: 2017-11-07 | DRG: 885 | Disposition: A | Discharge status: Home / Self Care | Payer: Federal, State, Local not specified - Other | Source: Intra-hospital | Attending: Psychiatry | Admitting: Psychiatry

## 2017-11-03 DIAGNOSIS — F17210 Nicotine dependence, cigarettes, uncomplicated: Secondary | ICD-10-CM | POA: Diagnosis not present

## 2017-11-03 DIAGNOSIS — Z23 Encounter for immunization: Secondary | ICD-10-CM | POA: Diagnosis not present

## 2017-11-03 DIAGNOSIS — F322 Major depressive disorder, single episode, severe without psychotic features: Principal | ICD-10-CM | POA: Diagnosis present

## 2017-11-03 DIAGNOSIS — Z818 Family history of other mental and behavioral disorders: Secondary | ICD-10-CM | POA: Diagnosis not present

## 2017-11-03 DIAGNOSIS — Z91128 Patient's intentional underdosing of medication regimen for other reason: Secondary | ICD-10-CM | POA: Diagnosis not present

## 2017-11-03 DIAGNOSIS — Z6379 Other stressful life events affecting family and household: Secondary | ICD-10-CM | POA: Diagnosis not present

## 2017-11-03 DIAGNOSIS — F10239 Alcohol dependence with withdrawal, unspecified: Secondary | ICD-10-CM | POA: Diagnosis present

## 2017-11-03 DIAGNOSIS — X58XXXA Exposure to other specified factors, initial encounter: Secondary | ICD-10-CM | POA: Diagnosis present

## 2017-11-03 DIAGNOSIS — Z79899 Other long term (current) drug therapy: Secondary | ICD-10-CM | POA: Diagnosis not present

## 2017-11-03 DIAGNOSIS — F1411 Cocaine abuse, in remission: Secondary | ICD-10-CM | POA: Diagnosis not present

## 2017-11-03 DIAGNOSIS — R451 Restlessness and agitation: Secondary | ICD-10-CM | POA: Diagnosis not present

## 2017-11-03 DIAGNOSIS — F1211 Cannabis abuse, in remission: Secondary | ICD-10-CM | POA: Diagnosis not present

## 2017-11-03 DIAGNOSIS — K219 Gastro-esophageal reflux disease without esophagitis: Secondary | ICD-10-CM | POA: Diagnosis present

## 2017-11-03 DIAGNOSIS — Z886 Allergy status to analgesic agent status: Secondary | ICD-10-CM | POA: Diagnosis not present

## 2017-11-03 DIAGNOSIS — F333 Major depressive disorder, recurrent, severe with psychotic symptoms: Secondary | ICD-10-CM | POA: Diagnosis present

## 2017-11-03 DIAGNOSIS — F4310 Post-traumatic stress disorder, unspecified: Secondary | ICD-10-CM | POA: Diagnosis present

## 2017-11-03 DIAGNOSIS — F603 Borderline personality disorder: Secondary | ICD-10-CM | POA: Diagnosis present

## 2017-11-03 DIAGNOSIS — F1020 Alcohol dependence, uncomplicated: Secondary | ICD-10-CM | POA: Diagnosis present

## 2017-11-03 DIAGNOSIS — F419 Anxiety disorder, unspecified: Secondary | ICD-10-CM | POA: Diagnosis present

## 2017-11-03 DIAGNOSIS — T43506A Underdosing of unspecified antipsychotics and neuroleptics, initial encounter: Secondary | ICD-10-CM | POA: Diagnosis present

## 2017-11-03 DIAGNOSIS — R45851 Suicidal ideations: Secondary | ICD-10-CM | POA: Diagnosis not present

## 2017-11-03 DIAGNOSIS — F332 Major depressive disorder, recurrent severe without psychotic features: Secondary | ICD-10-CM | POA: Diagnosis not present

## 2017-11-03 DIAGNOSIS — Z62810 Personal history of physical and sexual abuse in childhood: Secondary | ICD-10-CM | POA: Diagnosis not present

## 2017-11-03 DIAGNOSIS — R454 Irritability and anger: Secondary | ICD-10-CM | POA: Diagnosis not present

## 2017-11-03 MED ORDER — LORAZEPAM 1 MG PO TABS: 0.0000 mg | ORAL_TABLET | Freq: Two times a day (BID) | ORAL | Status: DC

## 2017-11-03 MED ORDER — TRAZODONE HCL 50 MG PO TABS: 150.0000 mg | ORAL_TABLET | Freq: Every evening | ORAL | Status: DC | PRN

## 2017-11-03 MED ORDER — HYDROXYZINE HCL 25 MG PO TABS: 25.0000 mg | ORAL_TABLET | Freq: Three times a day (TID) | ORAL | Status: DC | PRN

## 2017-11-03 MED ORDER — ONDANSETRON HCL 4 MG PO TABS
4.0000 mg | ORAL_TABLET | Freq: Three times a day (TID) | ORAL | Status: DC | PRN
  Administered 2017-11-03: 4 mg via ORAL
  Filled 2017-11-03: qty 1

## 2017-11-03 MED ORDER — ALUM & MAG HYDROXIDE-SIMETH 200-200-20 MG/5ML PO SUSP: 30.0000 mL | ORAL | Status: DC | PRN

## 2017-11-03 MED ORDER — PANTOPRAZOLE SODIUM 20 MG PO TBEC
20.0000 mg | DELAYED_RELEASE_TABLET | Freq: Every day | ORAL | Status: DC
  Administered 2017-11-03: 20 mg via ORAL
  Filled 2017-11-03 (×2): qty 1

## 2017-11-03 MED ORDER — CHLORDIAZEPOXIDE HCL 25 MG PO CAPS
25.0000 mg | ORAL_CAPSULE | Freq: Three times a day (TID) | ORAL | Status: AC
  Administered 2017-11-03 – 2017-11-04 (×4): 25 mg via ORAL
  Filled 2017-11-03 (×5): qty 1

## 2017-11-03 MED ORDER — NICOTINE 21 MG/24HR TD PT24
21.0000 mg | MEDICATED_PATCH | Freq: Every day | TRANSDERMAL | Status: DC
  Administered 2017-11-04 – 2017-11-07 (×4): 21 mg via TRANSDERMAL
  Filled 2017-11-03 (×8): qty 1

## 2017-11-03 MED ORDER — INFLUENZA VAC SPLIT QUAD 0.5 ML IM SUSY
0.5000 mL | PREFILLED_SYRINGE | INTRAMUSCULAR | Status: AC
  Administered 2017-11-04: 0.5 mL via INTRAMUSCULAR
  Filled 2017-11-03: qty 0.5

## 2017-11-03 MED ORDER — VITAMIN B-1 100 MG PO TABS
100.0000 mg | ORAL_TABLET | Freq: Every day | ORAL | Status: DC
  Administered 2017-11-03 – 2017-11-07 (×5): 100 mg via ORAL
  Filled 2017-11-03 (×10): qty 1

## 2017-11-03 MED ORDER — MAGNESIUM HYDROXIDE 400 MG/5ML PO SUSP
30.0000 mL | Freq: Every day | ORAL | Status: DC | PRN
  Administered 2017-11-04: 30 mL via ORAL
  Filled 2017-11-03: qty 30

## 2017-11-03 MED ORDER — TRAZODONE HCL 50 MG PO TABS: 50.0000 mg | ORAL_TABLET | Freq: Every evening | ORAL | Status: DC | PRN

## 2017-11-03 MED ORDER — MAGNESIUM HYDROXIDE 400 MG/5ML PO SUSP: 30.0000 mL | Freq: Every day | ORAL | Status: DC | PRN

## 2017-11-03 MED ORDER — LORAZEPAM 2 MG/ML IJ SOLN: 0.0000 mg | Freq: Four times a day (QID) | INTRAMUSCULAR | Status: DC

## 2017-11-03 MED ORDER — PANTOPRAZOLE SODIUM 20 MG PO TBEC
20.0000 mg | DELAYED_RELEASE_TABLET | Freq: Every day | ORAL | Status: DC
  Administered 2017-11-04 – 2017-11-07 (×4): 20 mg via ORAL
  Filled 2017-11-03 (×7): qty 1
  Filled 2017-11-03: qty 7

## 2017-11-03 MED ORDER — ACETAMINOPHEN 325 MG PO TABS: 650.0000 mg | ORAL_TABLET | Freq: Four times a day (QID) | ORAL | Status: DC | PRN

## 2017-11-03 MED ORDER — LORAZEPAM 2 MG/ML IJ SOLN: 0.0000 mg | Freq: Two times a day (BID) | INTRAMUSCULAR | Status: DC

## 2017-11-03 MED ORDER — ALUM & MAG HYDROXIDE-SIMETH 200-200-20 MG/5ML PO SUSP
30.0000 mL | ORAL | Status: DC | PRN
  Administered 2017-11-04 – 2017-11-07 (×3): 30 mL via ORAL
  Filled 2017-11-03 (×3): qty 30

## 2017-11-03 MED ORDER — HYDROXYZINE HCL 25 MG PO TABS
25.0000 mg | ORAL_TABLET | Freq: Three times a day (TID) | ORAL | Status: DC | PRN
  Administered 2017-11-03 – 2017-11-07 (×4): 25 mg via ORAL
  Filled 2017-11-03 (×5): qty 1

## 2017-11-03 MED ORDER — VITAMIN B-1 100 MG PO TABS
100.0000 mg | ORAL_TABLET | Freq: Every day | ORAL | Status: DC
  Administered 2017-11-03: 100 mg via ORAL
  Filled 2017-11-03: qty 1

## 2017-11-03 MED ORDER — LORAZEPAM 1 MG PO TABS
0.0000 mg | ORAL_TABLET | Freq: Four times a day (QID) | ORAL | Status: DC
Start: 2017-11-03 — End: 2017-11-03
  Administered 2017-11-03: 1 mg via ORAL
  Filled 2017-11-03: qty 1

## 2017-11-03 MED ORDER — THIAMINE HCL 100 MG/ML IJ SOLN: 100.0000 mg | Freq: Every day | INTRAMUSCULAR | Status: DC

## 2017-11-03 MED ORDER — DICYCLOMINE HCL 20 MG PO TABS
20.0000 mg | ORAL_TABLET | Freq: Four times a day (QID) | ORAL | Status: DC | PRN
  Administered 2017-11-06: 20 mg via ORAL
  Filled 2017-11-03: qty 1

## 2017-11-03 MED ORDER — CHLORDIAZEPOXIDE HCL 25 MG PO CAPS
25.0000 mg | ORAL_CAPSULE | Freq: Four times a day (QID) | ORAL | Status: DC | PRN
  Administered 2017-11-04 – 2017-11-06 (×4): 25 mg via ORAL
  Filled 2017-11-03 (×4): qty 1

## 2017-11-03 MED ORDER — TRAZODONE HCL 50 MG PO TABS
50.0000 mg | ORAL_TABLET | Freq: Every evening | ORAL | Status: DC | PRN
  Administered 2017-11-03 – 2017-11-06 (×6): 50 mg via ORAL
  Filled 2017-11-03 (×13): qty 1

## 2017-11-03 MED ORDER — NICOTINE 21 MG/24HR TD PT24
21.0000 mg | MEDICATED_PATCH | Freq: Every day | TRANSDERMAL | Status: DC
  Administered 2017-11-03: 21 mg via TRANSDERMAL
  Filled 2017-11-03: qty 1

## 2017-11-03 NOTE — ED Notes (Signed)
[facility] [facility] Emeline DarlingIze - to arrive between 1430-1500.

## 2017-11-03 NOTE — ED Notes (Addendum)
Pt voiced understanding and agreement w/tx plan - [facility]. Pt attempted to sign E-signature - pad not working. Pt asked for RN to call his girlfriend - Kennon RoundsSally Duty - [phone] - to advise of tx plan - RN spoke w/Sally.

## 2017-11-03 NOTE — ED Notes (Signed)
Pt voiced understanding of Medical Clearance Pt Policy - signed form - copy given. Pt states he has been under increased stress lately - work, home d/t girlfriend's child has been diagnosed w/ODD, he won custody of his 9-y-o daughter to live w/his mother and grandmother. States he drinks ETOH daily and smokes marijuana occasionally. Denies other drug use.

## 2017-11-03 NOTE — Progress Notes (Addendum)
Carlos French is a 26 year old male pt admitted on involuntary basis. On admission, Carlos French spoke about how he is having on-going family issues, reports that he has been drinking on a daily basis and does report having thoughts of suicide that come and go. He reports that he has been in rehab before and has been on medications and reports he would like to get back on his medications. He reports that he stopped them in the past due to financial reasons. He does endorse passive SI on admission but is able to contract for safety while in the hospital. He reports that he lives with his girlfriend and their children and will go back there at discharge. Carlos French was oriented to the unit and safety maintained.

## 2017-11-03 NOTE — ED Notes (Signed)
ED Provider at bedside. 

## 2017-11-03 NOTE — Tx Team (Signed)
Initial Treatment Plan 11/03/2017 4:50 PM Carlos French R Halle NWG:[id number]    PATIENT STRESSORS: Financial difficulties Marital or family conflict Medication change or noncompliance Substance abuse   PATIENT STRENGTHS: Ability for insight Average or above average intelligence Capable of independent living General fund of knowledge Motivation for treatment/growth   PATIENT IDENTIFIED PROBLEMS: Depression Suicidal thoughts Substance Abuse "Want to get back on my medications"                     DISCHARGE CRITERIA:  Ability to meet basic life and health needs Improved stabilization in mood, thinking, and/or behavior Verbal commitment to aftercare and medication compliance Withdrawal symptoms are absent or subacute and managed without 24-hour nursing intervention  PRELIMINARY DISCHARGE PLAN: Attend aftercare/continuing care group Return to previous living arrangement  PATIENT/FAMILY INVOLVEMENT: This treatment plan has been presented to and reviewed with the patient, Carlos French R Langbehn, and/or family member, .  The patient and family have been given the opportunity to ask questions and make suggestions.  Monet North, [place], [place] 11/03/2017, 4:50 PM

## 2017-11-03 NOTE — [facility] Counselor (Signed)
Re-assessment:   Patient report feeling confused due to the medication which was given to him. Patient report last night he felt suicidal. Patient report he has been feeling depressed for the past month. Report he had been drinking a lot of alcohol and mixed with his feelings of depression he had thoughts of wanting to hurt himself. Patient admitted to past suicidal thoughts with 3 past attempts. Denies current outpatient therapy. Denies medication management. Denies HI and AVH.

## 2017-11-03 NOTE — ED Notes (Signed)
Pt wanded by security. 

## 2017-11-03 NOTE — ED Notes (Addendum)
Pt ate 100% of breakfast - vomited. States has hx ETOH withdrawals - states has only had 1 seizure from w/drawal years ago. Ginger Ale and cold wash cloth given.

## 2017-11-03 NOTE — ED Notes (Signed)
Pt attempted to eat lunch - vomited. Zofran and Sprite given.

## 2017-11-03 NOTE — ED Notes (Signed)
Pt standing in doorway questioning staff about why he has to stay in his room. Security nearby. Pt agitated and refusing to put on medical clearance scrubs.

## 2017-11-03 NOTE — ED Notes (Addendum)
This RN explained medications given to pt. Pt took medication cooperatively with no issues. Pt stating "this is the medication you give to the crazy people to calm them down." This RN reiterated reason for medication administration d/t pt's agitation. This RN requested pt to put on scrubs again. Pt continuing to refuse. Will ask pt again once medication starts taking effect.
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
## 2017-11-03 NOTE — Progress Notes (Signed)
[facility] Accepting provider is Nira ConnJason Berry, NP. Attending is Dr. Emeline DarlingIze. Pt can arrive between 2:30pm and 3:00pm. [facility] to inform her of acceptance.   Daisy FloroCandace L Valiant Dills MSW, LCSW  11/03/2017 11:31 AM

## 2017-11-03 NOTE — ED Notes (Signed)
Pt allowed one 5 minute phone call to girlfriend. Pt currently laying in bed resting.

## 2017-11-03 NOTE — ED Notes (Signed)
TTS at bedside. 

## 2017-11-03 NOTE — ED Notes (Signed)
[facility] - was advised to call back at 1500 when Admission RN arrives.

## 2017-11-03 NOTE — ED Notes (Addendum)
Officer dropped off IVC paperwork. Pt currently asleep. Will continue to monitor.

## 2017-11-03 NOTE — BH Assessment (Signed)
Tele Assessment Note   Patient Name: Carlos French MRN: [id number] Referring Physician: Judd Lienelo   Location of Patient: [facility] Location of Provider: Behavioral Health TTS Department  Carlos French is an 26 y.o. male. Pt brought in by sheriff after being told by his girlfriend reported he was suicidal. Patient IVC. Pt reported being suicidal stating, "I've been suicidal for a week". When asked about a plan, pt stated, "I have a plan but I am not telling you what it is". However patient admitted to ED Provider that he would blow his on head off with a gun if he is allowed to leave and also reported that one time in the past he placed a gun in his mouth and pulled the trigger, it did not fire because it was jammed. Patient has history of depression. Patient BAL was 303. Patient denied HI and psychosis. Patient drug screen negative. Patient information was limited throughout assessment. Per nurse, patient was given Geodon prior to assessment due to agitation and lack of cooperation, patient refusing to put on scrubs. Patient was hiding under blanket on bed to avoid questions. Patient continued to answer "No" or "I don't know" with most questions and getting more and more upset with each question.   Patient was agitated. Patient was sleepy, drowsy and angry. Patient was alert at times but not forthcoming with information. Patient affect is angry and labile. Patient speech is rapid and/or pressured. Patient expresses impulsivity and inappropriate judgement.   Per Corrie Dandyavid Berry, patient recommended for inpatient treatment. Dr. Judd Lienelo and Nurse Caitlyn notified of disposition. TTS will seek placement.   Diagnosis: Major Depression D/S    Past Medical History:  Past Medical History:  Diagnosis Date  . Allergy   . Anxiety   . Bipolar 1 disorder (HCC)   . Depression   . GERD (gastroesophageal reflux disease)   . GI (gastrointestinal bleed)   . GI bleeding   . PTSD (post-traumatic stress disorder)      History reviewed. No pertinent surgical history.  Family History:  Family History  Problem Relation Age of Onset  . Stroke Mother     Social History:  reports that he has been smoking cigarettes.  He has been smoking about 1.00 pack per day. He has quit using smokeless tobacco. He reports that he drinks alcohol. He reports that he uses drugs. Drugs: Marijuana and Cocaine.  Additional Social History:  Alcohol / Drug Use Pain Medications: see MAR Prescriptions: see MAR Over the Counter: see MAR  CIWA: CIWA-Ar BP: 105/63 Pulse Rate: 85 COWS:    PATIENT STRENGTHS: (choose at least two) Average or above average intelligence Capable of independent living Communication skills  Allergies: No Known Allergies  Home Medications:  (Not in a hospital admission)  OB/GYN Status:  No LMP for male patient.  General Assessment Data [facility] Assessment: In system Is this a Tele or Face-to-Face Assessment?: Tele Assessment Is this an Initial Assessment or a Re-assessment for this encounter?: Initial Assessment Marital status: Single Is patient pregnant?: Other (Comment)(n/a) Pregnancy Status: Other (Comment)(n/a) Living Arrangements: Spouse/significant other Can pt return to current living arrangement?: Yes Admission Status: Involuntary Is patient capable of signing voluntary admission?: Yes Referral Source: Self/Family/Friend  Medical Screening Exam [facility] Walk-in ONLY) Medical Exam completed: Yes  Crisis Care Plan Living Arrangements: Spouse/significant other Legal Guardian: (self) Name of Psychiatrist: (none) Name of Therapist: (none)  Education Status Is patient currently in school?: No Current Grade: (n/a) Highest grade of  school patient has completed: (12 th grade) Name of school: (n/a) Contact person: (n/la)  Risk to self with the past 6 months Suicidal Ideation: Yes-Currently Present Has patient been a risk to self within the past 6  months prior to admission? : Yes(unable able to success.) Suicidal Intent: Yes-Currently Present Has patient had any suicidal intent within the past 6 months prior to admission? : Yes Is patient at risk for suicide?: No Suicidal Plan?: Yes-Currently Present Has patient had any suicidal plan within the past 6 months prior to admission? : No Specify Current Suicidal Plan: (Client reported he is happy.) Access to Means: No What has been your use of drugs/alcohol within the last 12 months?: ("I dont know") Previous Attempts/Gestures: Yes How many times?: (20) Substance abuse history and/or treatment for substance abuse?: Yes        Mental Status Report Motor Activity: Agitation     ADLScreening [facility] Assessment Services) Patient's cognitive ability adequate to safely complete daily activities?: Yes Patient able to express need for assistance with ADLs?: No Independently performs ADLs?: Yes (appropriate for developmental age)  Prior Inpatient Therapy Prior Inpatient Therapy: No Prior Therapy Facilty/Provider(s): NO Reason for Treatment: (SI )  Prior Outpatient Therapy Prior Therapy Dates: NO Prior Therapy Facilty/Provider(s): NO Reason for Treatment: No Does patient have an ACCT team?: No Does patient have Intensive In-House Services?  : No Does patient have [facility] services? : No Does patient have P4CC services?: No  ADL Screening (condition at time of admission) Patient's cognitive ability adequate to safely complete daily activities?: Yes Is the patient deaf or have difficulty hearing?: No Does the patient have difficulty seeing, even when wearing glasses/contacts?: No Does the patient have difficulty concentrating, remembering, or making decisions?: No Patient able to express need for assistance with ADLs?: No Does the patient have difficulty dressing or bathing?: No Independently performs ADLs?: Yes (appropriate for developmental age) Does the patient have difficulty  walking or climbing stairs?: No Weakness of Legs: None Weakness of Arms/Hands: None             Advance Directives (For Healthcare) Does Patient Have a Medical Advance Directive?: No Would patient like information on creating a medical advance directive?: No - Patient declined    Additional Information 1:1 In Past 12 Months?: No CIRT Risk: No Elopement Risk: Yes Does patient have medical clearance?: Yes     Disposition:  Per Corrie Dandyavid Berry, patient recommended for inpatient treatment. Dr. Judd Lienelo and Nurse Caitlyn notified of disposition. TTS will seek placement. Disposition Initial Assessment Completed for this Encounter: Yes Disposition of Patient: Inpatient treatment program(Per Nira ConnJason Berry, NP patient recommending inpatient for patie) Type of inpatient treatment program: Adult  This service was provided via telemedicine using a 2-way, interactive audio and video technology.  Names of all persons participating in this telemedicine service and their role in this encounter. Name: Al CorpusLatisha Jenee Spaugh, [facility]  Name: Octaviano Glowyler Cardoza Patient  Name: Eligha Bridegroomaitlyn, RN Role: Nurse         Burnetta SabinLatisha D Bearl Talarico 11/03/2017 3:58 AM

## 2017-11-03 NOTE — ED Notes (Signed)
Re-TTS being performed.  

## 2017-11-03 NOTE — ED Notes (Addendum)
[facility]C paperwork sent to medical records. Original paperwork placed in red folder in pod F.

## 2017-11-03 NOTE — ED Notes (Signed)
[facility] burgundy [facility]. Pt lying on stretcher w/eyes closed. Respirations even, unlabored. Asked pt if he would talk w/TTS for reassessment - stated he did not feel like it at this time d/t too sleepy.

## 2017-11-03 NOTE — [facility] Counselor (Signed)
Disposition:   Patient recommended for inpatient per Leighton Ruffina Okonkwo, NP.  TTS to seek placement.

## 2017-11-03 NOTE — ED Notes (Signed)
Pt's belongings inventoried and placed in locker #8. No valuables and no medications.

## 2017-11-04 DIAGNOSIS — Z62810 Personal history of physical and sexual abuse in childhood: Secondary | ICD-10-CM

## 2017-11-04 DIAGNOSIS — F419 Anxiety disorder, unspecified: Secondary | ICD-10-CM

## 2017-11-04 DIAGNOSIS — F17210 Nicotine dependence, cigarettes, uncomplicated: Secondary | ICD-10-CM

## 2017-11-04 DIAGNOSIS — F332 Major depressive disorder, recurrent severe without psychotic features: Secondary | ICD-10-CM

## 2017-11-04 DIAGNOSIS — Z6379 Other stressful life events affecting family and household: Secondary | ICD-10-CM

## 2017-11-04 DIAGNOSIS — R45851 Suicidal ideations: Secondary | ICD-10-CM

## 2017-11-04 DIAGNOSIS — F4010 Social phobia, unspecified: Secondary | ICD-10-CM

## 2017-11-04 DIAGNOSIS — F39 Unspecified mood [affective] disorder: Secondary | ICD-10-CM

## 2017-11-04 DIAGNOSIS — F410 Panic disorder [episodic paroxysmal anxiety] without agoraphobia: Secondary | ICD-10-CM

## 2017-11-04 DIAGNOSIS — R454 Irritability and anger: Secondary | ICD-10-CM

## 2017-11-04 DIAGNOSIS — R5383 Other fatigue: Secondary | ICD-10-CM

## 2017-11-04 DIAGNOSIS — R450 Nervousness: Secondary | ICD-10-CM

## 2017-11-04 MED ORDER — MIRTAZAPINE 15 MG PO TABS
15.0000 mg | ORAL_TABLET | Freq: Every day | ORAL | Status: DC
  Administered 2017-11-04 – 2017-11-06 (×3): 15 mg via ORAL
  Filled 2017-11-04 (×6): qty 1

## 2017-11-04 NOTE — [facility] Suicide Risk Assessment (Signed)
[facility]   Nursing information obtained from:    Demographic factors:    Current Mental Status:    Loss Factors:    Historical Factors:    Risk Reduction Factors:     Total Time spent with patient: 30 minutes Principal Problem: SUD Diagnosis:   Patient Active Problem List   Diagnosis Date Noted  . MDD (major depressive disorder) [F32.9] 11/03/2017  . Depression [F32.9]   . Borderline personality disorder (HCC) [F60.3] 02/10/2016  . Substance induced mood disorder (HCC) [F19.94] 02/09/2016  . Alcohol withdrawal (HCC) [F10.239] 08/23/2015  . Tobacco use disorder [F17.200] 08/23/2015  . Cocaine use disorder, severe, dependence (HCC) [F14.20] 08/11/2015  . Alcohol use disorder, severe, dependence (HCC) [F10.20] 08/11/2015  . Cannabis use disorder, severe, dependence [facility](HCC) [F12.20] 08/11/2015   Subjective Data:  26 y.o Caucasian male, single, employed, lives with his girlfriend and her kids.. Background history of SUD and mood disorder. Presented to the ER via the police. His girlfriend called 911 because patient had threatened to shoot himself. He was intoxicated at presentation. He expressed suicidal thoughts. He was agitated at the ER and required emergency medications. Routine labs shows mild leucocytosis, slightly elevated AST, low potassium and low sodium.  UDS was negative.  BAL 303 mg/dl Patient reports history of early life trauma. Says he has been stressed by situation at home. Her girlfriend has four kids. Two of them has special needs. Says he is overwhelmed by this. Says he get irritated easily. He feels depressed and withdrawn.  Reports relational issues with his own family. Felt overwhelmed and felt there is no point going on like this. Had attempted to shoot himself in the past. Continues to express hopelessness. No evidence of psychosis. No evidence of mania. No cognitive impairment. No current access to weapons.  Patient responded well to  Sertraline in the past. Discontinued as he had sexual side effects. We discussed use of Mirtazapine which is free of this effect. We discussed risks and benefits. He consented to treatment. He has no urge to harm self now. He has agreed to communicate suicidal thoughts of with staff if the thoughts becomes overwhelming.     Continued Clinical Symptoms:  Alcohol Use Disorder Identification Test Final Score (AUDIT): 40 The "Alcohol Use Disorders Identification Test", Guidelines for Use in Primary Care, Second Edition.  World Science writerHealth Organization [facility](WHO). Score between 0-7:  no or low risk or alcohol related problems. Score between 8-15:  moderate risk of alcohol related problems. Score between 16-19:  high risk of alcohol related problems. Score 20 or above:  warrants further diagnostic evaluation for alcohol dependence and treatment.   CLINICAL FACTORS:   Depression:   Severe Alcohol/Substance Abuse/Dependencies   Musculoskeletal: Strength & Muscle Tone: within normal limits Gait & Station: normal Patient leans: N/A  Psychiatric Specialty Exam: Physical Exam  Constitutional: He appears well-developed and well-nourished.  HENT:  Head: Normocephalic and atraumatic.  Respiratory: Effort normal.  Neurological: He is alert.  Psychiatric:  As above    ROS  Blood pressure 122/77, pulse 84, temperature 98 F (36.7 C), temperature source Oral, resp. rate 16, height 5\' 7"  (1.702 m), weight 51.3 kg (113 lb).Body mass index is 17.7 kg/m.  General Appearance: Casually dressed, moderate rapport, not shaky, not sweaty, not confused. Not unsteady, normal conjugate eye movements. Not internally distressed. Appropriate behavior.   Eye Contact:  Fair  Speech:  Soft spoken, not pressured.   Volume:  Decreased  Mood:  Depressed  Affect:  Appropriate and Restricted  Thought Process:  Linear  Orientation:  Full (Time, Place, and Person)  Thought Content:  Rumination. No delusional theme. No  hallucination in any modality.   Suicidal Thoughts:  Thoughts are still there. No intent to act here.   Homicidal Thoughts:  No  Memory:  Immediate;   Good Recent;   Good Remote;   Good  Judgement:  Fair  Insight:  Good  Psychomotor Activity:  Decreased  Concentration:  Concentration: Fair and Attention Span: Fair  Recall:  Good  Fund of Knowledge:  Fair  Language:  Good  Akathisia:  Negative  Handed:    AIMS (if indicated):     Assets:  ArchitectCommunication Skills Financial Resources/Insurance Housing Intimacy Physical Health  ADL's:  Intact  Cognition:  WNL  Sleep:  Number of Hours: 5.5      COGNITIVE FEATURES THAT CONTRIBUTE TO RISK:  None    SUICIDE RISK:   Moderate:  Frequent suicidal ideation with limited intensity, and duration, some specificity in terms of plans, no associated intent, good self-control, limited dysphoria/symptomatology, some risk factors present, and identifiable protective factors, including available and accessible social support.  PLAN OF CARE:  1. Suicide precautions 2. Mirtazapine 15 mg at bedtime to target depression 3. Alcohol withdrawal protocol.  4. SW would gather collateral from her family 5. Encourage unit groups and therapeutic activities.   I certify that inpatient services furnished can reasonably be expected to improve the patient's condition.   Georgiann CockerVincent A Izediuno, MD 11/04/2017, 11:29 AM
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
## 2017-11-04 NOTE — Progress Notes (Signed)
Patient did attend the evening speaker AA meeting.  

## 2017-11-04 NOTE — [facility] Group Notes (Signed)
[facility] LCSW Group Therapy Note    11/04/2017 10:15 AM   Type of Therapy and Topic: Group Therapy: Feelings Around Returning Home & Establishing a Supportive Framework    Participation Level: Active    Description of Group:  Patients first processed thoughts and feelings about up coming discharge. These included fears of upcoming changes, lack of change, new living environments, judgements and expectations from others and overall stigma of MH issues. We then discussed what is a supportive framework? What does it look like feel like and how do I discern it from and unhealthy non-supportive network? Learn how to cope when supports are not helpful and don't support you. Discuss what to do when your family/friends are not supportive.   Therapeutic Goals Addressed in Processing Group:  1. Patient will identify one healthy supportive network that they can use at discharge. 2. Patient will identify one factor of a supportive framework and how to tell it from an unhealthy network. 3. Patient able to identify one coping skill to use when they do not have positive supports from others. 4. Patient will demonstrate ability to communicate their needs through discussion and/or role plays.  Summary of Patient Progress:  Pt engaged easily during group session. As patients processed their anxiety about discharge and described healthy supports patient shared that he has done best in long term facilities. He processed his part in decompensation as difficulty advocating for self and covering the cost of medications.    Carney Bernatherine C Baley Lorimer, LCSW
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
## 2017-11-04 NOTE — Progress Notes (Signed)
D. Pt presents with an anxious affect with congruent mood. Pt is calm and cooperative- compliant with meds. Pt completed his self inventory- reporting his depression and hopelessness an 8/10 and anxiety a 9/10. Marland Kitchen. Pt currently denies SI/HI and AVH and agrees to contact staff before acting on any harmful thoughts.  A. Labs and vitals monitored. Pt given and educated on medications. Pt supported emotionally and encouraged to express concerns and ask questions.   R. Pt remains safe with 15 minute checks. Will continue POC.

## 2017-11-04 NOTE — Progress Notes (Signed)
DAR Note: Pt observed in the dayroom watching TV. Pt at the time of assessment endorsed moderate anxiety, depression and passive SI; contracts for safety; "I suicide comes and go; I feel better now." Pt denied depression, AVH, or pain. Pt was med compliant. All patient's questions and concerns addressed. 15-minute safety checks continue. Safety checks continue. Pt did attend wrap-up group

## 2017-11-05 DIAGNOSIS — F1210 Cannabis abuse, uncomplicated: Secondary | ICD-10-CM

## 2017-11-05 DIAGNOSIS — F1410 Cocaine abuse, uncomplicated: Secondary | ICD-10-CM

## 2017-11-05 DIAGNOSIS — R451 Restlessness and agitation: Secondary | ICD-10-CM

## 2017-11-05 DIAGNOSIS — F322 Major depressive disorder, single episode, severe without psychotic features: Principal | ICD-10-CM

## 2017-11-05 MED ORDER — QUETIAPINE FUMARATE 50 MG PO TABS
50.0000 mg | ORAL_TABLET | Freq: Every day | ORAL | Status: DC
  Administered 2017-11-06: 50 mg via ORAL
  Filled 2017-11-05 (×2): qty 1

## 2017-11-05 MED ORDER — QUETIAPINE FUMARATE 25 MG PO TABS
25.0000 mg | ORAL_TABLET | Freq: Two times a day (BID) | ORAL | Status: DC | PRN
  Administered 2017-11-05 – 2017-11-06 (×2): 25 mg via ORAL
  Filled 2017-11-05 (×3): qty 1

## 2017-11-05 MED ORDER — HALOPERIDOL LACTATE 5 MG/ML IJ SOLN
5.0000 mg | Freq: Once | INTRAMUSCULAR | Status: AC
  Administered 2017-11-05: 5 mg via INTRAMUSCULAR
  Filled 2017-11-05 (×2): qty 1

## 2017-11-05 MED ORDER — DIPHENHYDRAMINE HCL 50 MG/ML IJ SOLN
50.0000 mg | Freq: Once | INTRAMUSCULAR | Status: AC
  Administered 2017-11-05: 50 mg via INTRAVENOUS
  Filled 2017-11-05 (×2): qty 1

## 2017-11-05 NOTE — Progress Notes (Signed)
Patient denies SI, HI and AVH.  Patient does report high levels of agitation and irritability due to withdrawals.  Patient states "if you don't hurry up and give me something then my head is going to pop off."  Patient was noted to be clenching his fists face reddened and sweating. Patient received prn medications and was able to calm.  Assess patient for safety, offer medications as prescribed, engage patient in 1:1 staff talks.   Patient able to contract for safety, Continue to monitor as planned.

## 2017-11-05 NOTE — [facility] Suicide Risk Assessment (Signed)
[facility] INPATIENT:  Family/Significant Other Suicide Prevention Education  Suicide Prevention Education:  Education Completed; Carlos French (pt's significant other) [phone] has been identified by the patient as the family member/significant other with whom the patient will be residing, and identified as the person(s) who will aid the patient in the event of a mental health crisis (suicidal ideations/suicide attempt).  With written consent from the patient, the family member/significant other has been provided the following suicide prevention education, prior to the and/or following the discharge of the patient.  The suicide prevention education provided includes the following:  Suicide risk factors  Suicide prevention and interventions  National Suicide Hotline telephone number  [facility] assessment telephone number  [facility] Assistance 911  [facility] and/or [facility]idential [facility] telephone number  Request made of family/significant other to:  Remove weapons (e.g., guns, rifles, knives), all items previously/currently identified as safety concern.    Remove drugs/medications (over-the-counter, prescriptions, illicit drugs), all items previously/currently identified as a safety concern.  The family member/significant other verbalizes understanding of the suicide prevention education information provided.  The family member/significant other agrees to remove the items of safety concern listed above.  SPE and aftercare reviewed with pt's girlfriend. She shared that she has no concerns regarding pt returning to the home and regarding pt's safety. "The kids and I really miss him." SHe reports that she and pt discussed how to address his drinking. "He won't quit but he plans to cut down a lot and his goal is to not crave alcohol when he wakes up." CSW asked pt's girlfriend to encourage pt to attend MHAG support groups and AA for additional community  support. She confirms that pt has NO ACCESS to guns/weapons and that pt is able to return to the home as soon as MD finds pt ready for discharge.   Carlos French 11/05/2017, 11:55 AM

## 2017-11-05 NOTE — Progress Notes (Signed)
Did not attend group 

## 2017-11-05 NOTE — Tx Team (Signed)
Interdisciplinary Treatment and Diagnostic Plan Update  11/05/2017 Time of Session: [id number] Carlos French MRN: [id number]  Principal Diagnosis: MDD (major depressive disorder), severe (HCC)  Secondary Diagnoses: Principal Problem:   MDD (major depressive disorder), severe (HCC) Active Problems:   Alcohol use disorder, severe, dependence (HCC)   Borderline personality disorder (HCC)   Current Medications:  Current Facility-Administered Medications  Medication Dose Route Frequency Provider Last Rate Last Dose  . alum & mag hydroxide-simeth (MAALOX/MYLANTA) 200-200-20 MG/5ML suspension 30 mL  30 mL Oral Q4H PRN Okonkwo, Justina A, NP   30 mL at 11/04/17 1825  . chlordiazePOXIDE (LIBRIUM) capsule 25 mg  25 mg Oral QID PRN Georgiann CockerIzediuno, Vincent A, MD   25 mg at 11/04/17 2252  . dicyclomine (BENTYL) tablet 20 mg  20 mg Oral Q6H PRN Nira ConnBerry, Jason A, NP      . hydrOXYzine (ATARAX/VISTARIL) tablet 25 mg  25 mg Oral TID PRN Beryle Lathekonkwo, Justina A, NP   25 mg at 11/03/17 2113  . magnesium hydroxide (MILK OF MAGNESIA) suspension 30 mL  30 mL Oral Daily PRN Okonkwo, Justina A, NP   30 mL at 11/04/17 1824  . mirtazapine (REMERON) tablet 15 mg  15 mg Oral QHS Izediuno, Delight OvensVincent A, MD   15 mg at 11/04/17 2252  . nicotine (NICODERM CQ - dosed in mg/24 hours) patch 21 mg  21 mg Transdermal Daily Okonkwo, Justina A, NP   21 mg at 11/05/17 0800  . pantoprazole (PROTONIX) EC tablet 20 mg  20 mg Oral Daily Okonkwo, Justina A, NP   20 mg at 11/05/17 0800  . thiamine (VITAMIN B-1) tablet 100 mg  100 mg Oral Daily Izediuno, Vincent A, MD   100 mg at 11/05/17 0800  . traZODone (DESYREL) tablet 50 mg  50 mg Oral QHS,MR X 1 Nira ConnBerry, Jason A, NP   50 mg at 11/04/17 2252   PTA Medications: Medications Prior to Admission  Medication Sig Dispense Refill Last Dose  . calcium carbonate (TUMS - DOSED IN MG ELEMENTAL CALCIUM) 500 MG chewable tablet Chew 1 tablet by mouth 4 (four) times daily as needed for indigestion or heartburn.    11/02/2017 at Unknown time  . cephALEXin (KEFLEX) 500 MG capsule Take 1 capsule (500 mg total) by mouth 4 (four) times daily. (Patient not taking: Reported on 11/03/2017) 20 capsule 0 Not Taking at Unknown time  . famotidine (PEPCID) 20 MG tablet Take 1 tablet (20 mg total) by mouth 2 (two) times daily. (Patient taking differently: Take 20 mg daily by mouth. ) 30 tablet 0 11/02/2017 at Unknown time  . lidocaine (LIDODERM) 5 % Place 1 patch onto the skin daily. Remove & Discard patch within 12 hours or as directed by MD (Patient not taking: Reported on 11/03/2017) 30 patch 0 Not Taking at Unknown time  . Multiple Vitamin (MULTIVITAMIN WITH MINERALS) TABS tablet Take 1 tablet daily by mouth.   11/02/2017 at Unknown time  . ondansetron (ZOFRAN ODT) 4 MG disintegrating tablet Take 1 tablet (4 mg total) by mouth every 8 (eight) hours as needed for nausea or vomiting. (Patient not taking: Reported on 11/03/2017) 20 tablet 0 Not Taking at Unknown time  . sucralfate (CARAFATE) 1 GM/10ML suspension Take 10 mLs (1 g total) by mouth 4 (four) times daily -  with meals and at bedtime. (Patient not taking: Reported on 11/03/2017) 420 mL 0 Not Taking at Unknown time    Patient Stressors: Financial difficulties Marital or family conflict Medication change or noncompliance  Substance abuse  Patient Strengths: Ability for insight Average or above average intelligence Capable of independent living General fund of knowledge Motivation for treatment/growth  Treatment Modalities: Medication Management, Group therapy, Case management,  1 to 1 session with clinician, Psychoeducation, Recreational therapy.   Physician Treatment Plan for Primary Diagnosis: MDD (major depressive disorder), severe (HCC) Long Term Goal(s): Improvement in symptoms so as ready for discharge Improvement in symptoms so as ready for discharge   Short Term Goals: Ability to verbalize feelings will improve Ability to disclose and discuss  suicidal ideas Compliance with prescribed medications will improve Ability to identify triggers associated with substance abuse/mental health issues will improve  Medication Management: Evaluate patient's response, side effects, and tolerance of medication regimen.  Therapeutic Interventions: 1 to 1 sessions, Unit Group sessions and Medication administration.  Evaluation of Outcomes: Progressing  Physician Treatment Plan for Secondary Diagnosis: Principal Problem:   MDD (major depressive disorder), severe (HCC) Active Problems:   Alcohol use disorder, severe, dependence (HCC)   Borderline personality disorder (HCC)  Long Term Goal(s): Improvement in symptoms so as ready for discharge Improvement in symptoms so as ready for discharge   Short Term Goals: Ability to verbalize feelings will improve Ability to disclose and discuss suicidal ideas Compliance with prescribed medications will improve Ability to identify triggers associated with substance abuse/mental health issues will improve     Medication Management: Evaluate patient's response, side effects, and tolerance of medication regimen.  Therapeutic Interventions: 1 to 1 sessions, Unit Group sessions and Medication administration.  Evaluation of Outcomes: Progressing   RN Treatment Plan for Primary Diagnosis: MDD (major depressive disorder), severe (HCC) Long Term Goal(s): Knowledge of disease and therapeutic regimen to maintain health will improve  Short Term Goals: Ability to remain free from injury will improve, Ability to disclose and discuss suicidal ideas and Ability to identify and develop effective coping behaviors will improve  Medication Management: RN will administer medications as ordered by provider, will assess and evaluate patient's response and provide education to patient for prescribed medication. RN will report any adverse and/or side effects to prescribing provider.  Therapeutic Interventions: 1 on 1  counseling sessions, Psychoeducation, Medication administration, Evaluate responses to treatment, Monitor vital signs and CBGs as ordered, Perform/monitor CIWA, COWS, AIMS and Fall Risk screenings as ordered, Perform wound care treatments as ordered.  Evaluation of Outcomes: Progressing   LCSW Treatment Plan for Primary Diagnosis: MDD (major depressive disorder), severe (HCC) Long Term Goal(s): Safe transition to appropriate next level of care at discharge, Engage patient in therapeutic group addressing interpersonal concerns.  Short Term Goals: Engage patient in aftercare planning with referrals and resources, Facilitate patient progression through stages of change regarding substance use diagnoses and concerns and Identify triggers associated with mental health/substance abuse issues  Therapeutic Interventions: Assess for all discharge needs, 1 to 1 time with Social worker, Explore available resources and support systems, Assess for adequacy in community support network, Educate family and significant other(s) on suicide prevention, Complete Psychosocial Assessment, Interpersonal group therapy.  Evaluation of Outcomes: Progressing   Progress in Treatment: Attending groups: Yes. Participating in groups: Yes. Taking medication as prescribed: Yes. Toleration medication: Yes. Family/Significant other contact made: No, will contact:  significant other if patient consents. Patient understands diagnosis: Yes. Discussing patient identified problems/goals with staff: Yes. Medical problems stabilized or resolved: No. Denies suicidal/homicidal ideation: Yes. Issues/concerns per patient self-inventory: No. Other: n/a   New problem(s) identified: No, Describe:  n/a  New Short Term/Long Term Goal(s): detox, medication  management for mood stabilization, development of comprehensive mental wellness/sobriety plan.   Discharge Plan or Barriers: CSW continuing to assess for appropriate referrals.  [facility] for outpatient mental health care. Pt provided with [facility]AG pamphlet and AA/NA list for additional community support.   Reason for Continuation of Hospitalization: Anxiety Depression Medication stabilization Suicidal ideation Withdrawal symptoms  Estimated Length of Stay: Wed 11/07/17  Attendees: Patient: 11/05/2017 8:50 AM  Physician: Dr. Altamese Carolinaainville MD; Dr. Jackquline BerlinIzediuno MD 11/05/2017 8:50 AM  Nursing: Micah FlesherRoni, Jane RN 11/05/2017 8:50 AM  RN Care Manager: Onnie BoerJennifer Clark CM 11/05/2017 8:50 AM  Social Worker: Chartered loss adjusterHeather Smart, LCSW 11/05/2017 8:50 AM  Recreational Therapist: x 11/05/2017 8:50 AM  Other: Reola Calkinsravis Money NP; Hillery Jacksanika Lewis NP 11/05/2017 8:50 AM  Other:  11/05/2017 8:50 AM  Other: 11/05/2017 8:50 AM    Scribe for Treatment Team: Ledell PeoplesHeather N Smart, LCSW 11/05/2017 8:50 AM

## 2017-11-05 NOTE — Progress Notes (Signed)
Patient ID: Carlos French, male   DOB: [birth year], 26 y.o.   MRN: [id number]  DAR Note: Pt at the time of assessment was flat and withdrawn to self in the dayroom. Pt at the time endorsed moderate depression, anxiety and irritability; "I told the doctor that came in today that seroquel worked very well for my bipolar when I was taking it but no one would listen to me." Pt denied SI, HI, or AVH. Pt was med compliant All patient's questions and concerns addressed. Support, encouragement, and safe environment provided. 15-minute safety checks continue. Pt did attend AA group.

## 2017-11-05 NOTE — Progress Notes (Signed)
[facility]  11/05/2017 10:43 AM Raelene Bottyler R Cloer  MRN:  [id number]   Subjective: Reports that he is unhappy with medications because he requested Seroquel last night as it "worked for him in the past for bipolar", but did not receive it. Received Remeron last night and is not satisfied with just having that. Didn't sleep well last night and reports feeling like "someone is watching and touching my feet" and has had this sensation in the past. Woke up this morning and ate breakfast, without nausea. Has mild abdominal discomfort. Denies SI/HI.  Objective: Lying in a dark room trying to fall asleep. Mood is agitated and irritated. Speech is normal with appropriate thought process. Flat affect, tone is respectful. Lethargic with minimal eye contact. Due to patient complaining of lack of sleep and bipolar disorder, will start him on Seroquel 50 mg at night and 25 mg BID prn for agitation.   Principal Problem: MDD (major depressive disorder), severe (HCC) Diagnosis:   Patient Active Problem List   Diagnosis Date Noted  . MDD (major depressive disorder), severe (HCC) [F32.2] 11/03/2017  . Depression [F32.9]   . Borderline personality disorder (HCC) [F60.3] 02/10/2016  . Substance induced mood disorder (HCC) [F19.94] 02/09/2016  . Alcohol withdrawal (HCC) [F10.239] 08/23/2015  . Tobacco use disorder [F17.200] 08/23/2015  . Cocaine use disorder, severe, dependence (HCC) [F14.20] 08/11/2015  . Alcohol use disorder, severe, dependence (HCC) [F10.20] 08/11/2015  . Cannabis use disorder, severe, dependence (HCC) [F12.20] 08/11/2015   Total Time spent with patient: 25 minutes  Past Psychiatric History: See H&P  Past Medical History:  Past Medical History:  Diagnosis Date  . Allergy   . Anxiety   . Bipolar 1 disorder (HCC)   . Depression   . GERD (gastroesophageal reflux disease)   . GI (gastrointestinal bleed)   . GI bleeding   . PTSD (post-traumatic stress disorder)    History  reviewed. No pertinent surgical history. Family History:  Family History  Problem Relation Age of Onset  . Stroke Mother    Family Psychiatric  History: See H&P Social History: Social History   Substance and Sexual Activity  Alcohol Use Yes   Comment: daily     Social History   Substance and Sexual Activity  Drug Use Yes  . Types: Marijuana, Cocaine   Comment: "over a year"    Social History   Socioeconomic History  . Marital status: Single    Spouse name: None  . Number of children: None  . Years of education: None  . Highest education level: None  Social Needs  . Financial resource strain: None  . Food insecurity - worry: None  . Food insecurity - inability: None  . Transportation needs - medical: None  . Transportation needs - non-medical: None  Occupational History  . None  Tobacco Use  . Smoking status: Current Every Day Smoker    Packs/day: 1.00    Types: Cigarettes  . Smokeless tobacco: Former Engineer, waterUser  Substance and Sexual Activity  . Alcohol use: Yes    Comment: daily  . Drug use: Yes    Types: Marijuana, Cocaine    Comment: "over a year"  . Sexual activity: No  Other Topics Concern  . None  Social History Narrative  . None   Additional Social History:    Sleep: Poor  Appetite:  Good  Current Medications: Current Facility-Administered Medications  Medication Dose Route Frequency Provider Last Rate Last Dose  . alum & mag hydroxide-simeth (MAALOX/MYLANTA)  200-200-20 MG/5ML suspension 30 mL  30 mL Oral Q4H PRN Okonkwo, Justina A, NP   30 mL at 11/04/17 1825  . chlordiazePOXIDE (LIBRIUM) capsule 25 mg  25 mg Oral QID PRN Georgiann CockerIzediuno, Vincent A, MD   25 mg at 11/04/17 2252  . dicyclomine (BENTYL) tablet 20 mg  20 mg Oral Q6H PRN Nira ConnBerry, Jason A, NP      . hydrOXYzine (ATARAX/VISTARIL) tablet 25 mg  25 mg Oral TID PRN Beryle Lathekonkwo, Justina A, NP   25 mg at 11/03/17 2113  . magnesium hydroxide (MILK OF MAGNESIA) suspension 30 mL  30 mL Oral Daily PRN Okonkwo,  Justina A, NP   30 mL at 11/04/17 1824  . mirtazapine (REMERON) tablet 15 mg  15 mg Oral QHS Izediuno, Delight OvensVincent A, MD   15 mg at 11/04/17 2252  . nicotine (NICODERM CQ - dosed in mg/24 hours) patch 21 mg  21 mg Transdermal Daily Okonkwo, Justina A, NP   21 mg at 11/05/17 0800  . pantoprazole (PROTONIX) EC tablet 20 mg  20 mg Oral Daily Okonkwo, Justina A, NP   20 mg at 11/05/17 0800  . thiamine (VITAMIN B-1) tablet 100 mg  100 mg Oral Daily Izediuno, Vincent A, MD   100 mg at 11/05/17 0800  . traZODone (DESYREL) tablet 50 mg  50 mg Oral QHS,MR X 1 Nira ConnBerry, Jason A, NP   50 mg at 11/04/17 2252    Lab Results: No results found for this or any previous visit (from the past 48 hour(s)).  Blood Alcohol level:  Lab Results  Component Value Date   ETH 303 (HH) 11/02/2017   ETH 45 (H) 05/23/2017    Metabolic Disorder Labs: Lab Results  Component Value Date   HGBA1C 4.6 02/10/2016   Lab Results  Component Value Date   PROLACTIN 32.3 (H) 02/10/2016   Lab Results  Component Value Date   CHOL 209 (H) 02/10/2016   TRIG 120 02/10/2016   HDL 86 02/10/2016   CHOLHDL 2.4 02/10/2016   VLDL 24 02/10/2016   LDLCALC 99 02/10/2016   LDLCALC 100 (H) 08/12/2015    Physical Findings: AIMS: Facial and Oral Movements Muscles of Facial Expression: None, normal Lips and Perioral Area: None, normal Jaw: None, normal Tongue: None, normal,Extremity Movements Upper (arms, wrists, hands, fingers): None, normal Lower (legs, knees, ankles, toes): None, normal, Trunk Movements Neck, shoulders, hips: None, normal, Overall Severity Severity of abnormal movements (highest score from questions above): None, normal Incapacitation due to abnormal movements: None, normal Patient's awareness of abnormal movements (rate only patient's report): No Awareness, Dental Status Current problems with teeth and/or dentures?: No Does patient usually wear dentures?: No  CIWA:  CIWA-Ar Total: 9 COWS:      Musculoskeletal: Strength & Muscle Tone: within normal limits Gait & Station: normal Patient leans: N/A  Psychiatric Specialty Exam: Physical Exam  ROS  Blood pressure 130/86, pulse (!) 101, temperature 97.8 F (36.6 C), temperature source Oral, resp. rate 20, height 5\' 7"  (1.702 m), weight 51.3 kg (113 lb).Body mass index is 17.7 kg/m.  General Appearance: Casual and Fairly Groomed  Eye Contact:  Minimal  Speech:  Normal Rate  Volume:  Normal  Mood:  Irritable  Affect:  Flat  Thought Process:  Coherent  Orientation:  Full (Time, Place, and Person)  Thought Content:  Logical  Suicidal Thoughts:  No  Homicidal Thoughts:  No  Memory:  Immediate;   Good  Judgement:  Good  Insight:  Good  Psychomotor  Activity:  Normal  Concentration:  Concentration: Good  Recall:  Good  Fund of Knowledge:  Good  Language:  Good  Akathisia:  No  Handed:  Right  AIMS (if indicated):     Assets:  Physical Health  ADL's:  Intact  Cognition:  WNL  Sleep:  Number of Hours: 5.75   Problems addressed: Alcohol use disorder and withdrawal Cocaine use disorder MDD with history of suicidal ideation Anxiety Bipolar I disorder without psychosis   Treatment Plan Summary: -Continue alcohol withdrawal protocol..  -Continue Mirtazapine 15 mg PO QHS for insomnia -Continue Vistaril 25 mg PO TID for anxiety -Start Seroquel 50 mg QD for bipolar disorder  -Start Seroquel 25 mg BID PRN for agitation. -Encourage group therapy participation  Maryfrances Bunnell, FNP 11/05/2017, 10:43 AM

## 2017-11-05 NOTE — Progress Notes (Signed)
Recreation Therapy Notes  Date: 11/05/17 Time: 0930 Location: [address] Dayroom  Group Topic: Stress Management  Goal Area(s) Addresses:  Patient will verbalize importance of using healthy stress management.  Patient will identify positive emotions associated with healthy stress management.   Intervention: Stress Management  Activity :  Meditation.  LRT introduced the stress management technique of meditation.  LRT played a meditation from the Calm app that allowed patients to focus on the things they are grateful for.  Education:  Stress Management, Discharge Planning.   Education Outcome: Acknowledges edcuation/In group clarification offered/Needs additional education  Clinical Observations/Feedback: Pt did not attend group.    Caroll RancherMarjette Yusif Gnau, LRT/CTRS         Caroll RancherLindsay, Felipa Laroche A 11/05/2017 12:15 PM

## 2017-11-05 NOTE — [facility] Group Notes (Signed)
LCSW Group Therapy Note   11/05/2017 1:15pm   Type of Therapy and Topic:  Group Therapy:  Overcoming Obstacles   Participation Level:  Active   Description of Group:    In this group patients will be encouraged to explore what they see as obstacles to their own wellness and recovery. They will be guided to discuss their thoughts, feelings, and behaviors related to these obstacles. The group will process together ways to cope with barriers, with attention given to specific choices patients can make. Each patient will be challenged to identify changes they are motivated to make in order to overcome their obstacles. This group will be process-oriented, with patients participating in exploration of their own experiences as well as giving and receiving support and challenge from other group members.   Therapeutic Goals: 1. Patient will identify personal and current obstacles as they relate to admission. 2. Patient will identify barriers that currently interfere with their wellness or overcoming obstacles.  3. Patient will identify feelings, thought process and behaviors related to these barriers. 4. Patient will identify two changes they are willing to make to overcome these obstacles:      Summary of Patient Progress  Carlos French was attentive and engaged during today's processing group. He shared that he is upset about his medications and "don't understand why I'm on such a low dose of everything. I'm so agitated I could punch a wall." Carlos French was redirectable and does not appear outwardly agitated or aggressive. Pt pleasant and cooperative throughout group and agreed to get social worker or nurse if he felt that his mood was out of control. Carlos French plans to return home to his family at discharge. "I just really need help with my anxiety. It's why I drink."    Therapeutic Modalities:   Cognitive Behavioral Therapy Solution Focused Therapy Motivational Interviewing Relapse Prevention Therapy  [name], LCSW 11/05/2017 12:05 PM

## 2017-11-06 MED ORDER — QUETIAPINE FUMARATE 100 MG PO TABS
100.0000 mg | ORAL_TABLET | Freq: Every day | ORAL | Status: DC
  Administered 2017-11-06: 100 mg via ORAL
  Filled 2017-11-06 (×3): qty 1

## 2017-11-06 MED ORDER — GABAPENTIN 100 MG PO CAPS
100.0000 mg | ORAL_CAPSULE | Freq: Three times a day (TID) | ORAL | Status: DC
  Administered 2017-11-06 – 2017-11-07 (×3): 100 mg via ORAL
  Filled 2017-11-06 (×8): qty 1

## 2017-11-06 NOTE — Progress Notes (Signed)
[facility]  11/06/2017 2:27 PM Carlos French  MRN:  [id number]   Subjective:  Patient reports that he is doing okay, but "That Seroquel didn't work. They had to give me Haldol and something else to calm me down."     Objective: Patient's chart and findings reviewed and discussed with treatment team. Patient reports that the Seroquel di not help him and they had to give him Haldol yesterday, but upon reviewing notes the patient received the Haldol and Benadryl before ever getting the QHS Seroquel dose. Will prescribe Gabapentin to assist wit withdrawal symptoms and anxiety/agitation.  Principal Problem: MDD (major depressive disorder), severe (HCC) Diagnosis:   Patient Active Problem List   Diagnosis Date Noted  . MDD (major depressive disorder), severe (HCC) [F32.2] 11/03/2017  . Depression [F32.9]   . Borderline personality disorder (HCC) [F60.3] 02/10/2016  . Substance induced mood disorder (HCC) [F19.94] 02/09/2016  . Alcohol withdrawal (HCC) [F10.239] 08/23/2015  . Tobacco use disorder [F17.200] 08/23/2015  . Cocaine use disorder, severe, dependence (HCC) [F14.20] 08/11/2015  . Alcohol use disorder, severe, dependence (HCC) [F10.20] 08/11/2015  . Cannabis use disorder, severe, dependence (HCC) [F12.20] 08/11/2015   Total Time spent with patient: 25 minutes  Past Psychiatric History: See H&P  Past Medical History:  Past Medical History:  Diagnosis Date  . Allergy   . Anxiety   . Bipolar 1 disorder (HCC)   . Depression   . GERD (gastroesophageal reflux disease)   . GI (gastrointestinal bleed)   . GI bleeding   . PTSD (post-traumatic stress disorder)    History reviewed. No pertinent surgical history. Family History:  Family History  Problem Relation Age of Onset  . Stroke Mother    Family Psychiatric  History: See H&P Social History:  Social History   Substance and Sexual Activity  Alcohol Use Yes   Comment: daily     Social History   Substance and  Sexual Activity  Drug Use Yes  . Types: Marijuana, Cocaine   Comment: "over a year"    Social History   Socioeconomic History  . Marital status: Single    Spouse name: None  . Number of children: None  . Years of education: None  . Highest education level: None  Social Needs  . Financial resource strain: None  . Food insecurity - worry: None  . Food insecurity - inability: None  . Transportation needs - medical: None  . Transportation needs - non-medical: None  Occupational History  . None  Tobacco Use  . Smoking status: Current Every Day Smoker    Packs/day: 1.00    Types: Cigarettes  . Smokeless tobacco: Former Engineer, waterUser  Substance and Sexual Activity  . Alcohol use: Yes    Comment: daily  . Drug use: Yes    Types: Marijuana, Cocaine    Comment: "over a year"  . Sexual activity: No  Other Topics Concern  . None  Social History Narrative  . None   Additional Social History:                         Sleep: Good  Appetite:  Good  Current Medications: Current Facility-Administered Medications  Medication Dose Route Frequency Provider Last Rate Last Dose  . alum & mag hydroxide-simeth (MAALOX/MYLANTA) 200-200-20 MG/5ML suspension 30 mL  30 mL Oral Q4H PRN Okonkwo, Justina A, NP   30 mL at 11/04/17 1825  . chlordiazePOXIDE (LIBRIUM) capsule 25 mg  25 mg  Oral QID PRN Georgiann CockerIzediuno, Vincent A, MD   25 mg at 11/05/17 1134  . dicyclomine (BENTYL) tablet 20 mg  20 mg Oral Q6H PRN Nira ConnBerry, Jason A, NP   20 mg at 11/06/17 0158  . gabapentin (NEURONTIN) capsule 100 mg  100 mg Oral TID Money, Gerlene Burdockravis B, FNP      . hydrOXYzine (ATARAX/VISTARIL) tablet 25 mg  25 mg Oral TID PRN Beryle Lathekonkwo, Justina A, NP   25 mg at 11/05/17 1134  . magnesium hydroxide (MILK OF MAGNESIA) suspension 30 mL  30 mL Oral Daily PRN Okonkwo, Justina A, NP   30 mL at 11/04/17 1824  . mirtazapine (REMERON) tablet 15 mg  15 mg Oral QHS Izediuno, Delight OvensVincent A, MD   15 mg at 11/06/17 0158  . nicotine (NICODERM CQ -  dosed in mg/24 hours) patch 21 mg  21 mg Transdermal Daily Okonkwo, Justina A, NP   21 mg at 11/06/17 1218  . pantoprazole (PROTONIX) EC tablet 20 mg  20 mg Oral Daily Okonkwo, Justina A, NP   20 mg at 11/06/17 1218  . QUEtiapine (SEROQUEL) tablet 100 mg  100 mg Oral QHS Money, Gerlene Burdockravis B, FNP      . QUEtiapine (SEROQUEL) tablet 25 mg  25 mg Oral BID PRN Money, Gerlene Burdockravis B, FNP   25 mg at 11/05/17 1134  . thiamine (VITAMIN B-1) tablet 100 mg  100 mg Oral Daily Izediuno, Delight OvensVincent A, MD   100 mg at 11/06/17 1219  . traZODone (DESYREL) tablet 50 mg  50 mg Oral QHS,MR X 1 Nira ConnBerry, Jason A, NP   50 mg at 11/06/17 0159    Lab Results: No results found for this or any previous visit (from the past 48 hour(s)).  Blood Alcohol level:  Lab Results  Component Value Date   ETH 303 (HH) 11/02/2017   ETH 45 (H) 05/23/2017    Metabolic Disorder Labs: Lab Results  Component Value Date   HGBA1C 4.6 02/10/2016   Lab Results  Component Value Date   PROLACTIN 32.3 (H) 02/10/2016   Lab Results  Component Value Date   CHOL 209 (H) 02/10/2016   TRIG 120 02/10/2016   HDL 86 02/10/2016   CHOLHDL 2.4 02/10/2016   VLDL 24 02/10/2016   LDLCALC 99 02/10/2016   LDLCALC 100 (H) 08/12/2015    Physical Findings: AIMS: Facial and Oral Movements Muscles of Facial Expression: None, normal Lips and Perioral Area: None, normal Jaw: None, normal Tongue: None, normal,Extremity Movements Upper (arms, wrists, hands, fingers): None, normal Lower (legs, knees, ankles, toes): None, normal, Trunk Movements Neck, shoulders, hips: None, normal, Overall Severity Severity of abnormal movements (highest score from questions above): None, normal Incapacitation due to abnormal movements: None, normal Patient's awareness of abnormal movements (rate only patient's report): No Awareness, Dental Status Current problems with teeth and/or dentures?: No Does patient usually wear dentures?: No  CIWA:  CIWA-Ar Total: 15 COWS:      Musculoskeletal: Strength & Muscle Tone: within normal limits Gait & Station: normal Patient leans: N/A  Psychiatric Specialty Exam: Physical Exam  Nursing note and vitals reviewed. Constitutional: He is oriented to person, place, and time. He appears well-developed and well-nourished.  Cardiovascular: Normal rate.  Respiratory: Effort normal.  Musculoskeletal: Normal range of motion.  Neurological: He is alert and oriented to person, place, and time.  Skin: Skin is warm.    Review of Systems  Constitutional: Negative.   HENT: Negative.   Eyes: Negative.   Respiratory: Negative.  Cardiovascular: Negative.   Gastrointestinal: Negative.   Genitourinary: Negative.   Musculoskeletal: Negative.   Skin: Negative.   Neurological: Negative.   Endo/Heme/Allergies: Negative.   Psychiatric/Behavioral: Negative.     Blood pressure 112/83, pulse 97, temperature 98.1 F (36.7 C), temperature source Oral, resp. rate 20, height 5\' 7"  (1.702 m), weight 51.3 kg (113 lb).Body mass index is 17.7 kg/m.  General Appearance: Casual  Eye Contact:  Good  Speech:  Clear and Coherent and Normal Rate  Volume:  Normal  Mood:  Anxious  Affect:  Congruent  Thought Process:  Goal Directed and Descriptions of Associations: Intact  Orientation:  Full (Time, Place, and Person)  Thought Content:  WDL  Suicidal Thoughts:  No  Homicidal Thoughts:  No  Memory:  Immediate;   Good Recent;   Good Remote;   Good  Judgement:  Good  Insight:  Good  Psychomotor Activity:  Normal  Concentration:  Concentration: Good and Attention Span: Good  Recall:  Good  Fund of Knowledge:  Good  Language:  Good  Akathisia:  No  Handed:  Right  AIMS (if indicated):     Assets:  Communication Skills Desire for Improvement Financial Resources/Insurance Housing Physical Health Social Support  ADL's:  Intact  Cognition:  WNL  Sleep:  Number of Hours: 6.25   Problems Addressed: Alcohol use disorder Borderline  Personality Disorder MDD Severe  Treatment Plan Summary: Daily contact with patient to assess and evaluate symptoms and progress in treatment, Medication management and Plan is to:  -Continue Seroquel 25 mg PO BID for agitation -Increase Seroquel 100 mg PO QHS for mood stability -Continue Librium Detox -Start Gabapentin 100 mg PO TID for withdrawal symptoms -Continue Trazodone 50 mg PO QHS for insomnia -Continue Mirtazapine 15 mg PO QHS for mood stability -Encourage group therapy participation   Maryfrances Bunnell, FNP 11/06/2017, 2:27 PM   Agree with NP Progress Note

## 2017-11-06 NOTE — [facility] Group Notes (Signed)
[facility] 11/06/2017 1:15pm  Type of Therapy: Mental Health Association Presentation  Participation Level: DID NOT ATTEND. Chose not to attend.   Summary of Progress/Problems: Mental Health Association [facility]) Speaker came to talk about his personal journey with mental health. The pt processed ways by which to relate to the speaker. MHA speaker provided handouts and educational information pertaining to groups and services offered by the [facility]. Pt was engaged in speaker's presentation and was receptive to resources provided.    Pulte HomesHeather N Smart, LCSW 11/06/2017 12:36 PM

## 2017-11-06 NOTE — Progress Notes (Signed)
Recreation Therapy Notes  Animal-Assisted Activity (AAA) Program Checklist/Progress Notes Patient Eligibility Criteria Checklist & Daily Group note for Rec TxIntervention  Date: 11.13.2018 Time: 2:45pm [address]   AAA/T Program Assumption of Risk Form signed by Patient/ or Parent Legal Guardian Yes  Patient is free of allergies or sever asthma Yes  Patient reports no fear of animals Yes  Patient reports no history of cruelty to animals Yes  Patient understands his/her participation is voluntary Yes  Behavioral Response: Did not attend.   Marykay Lexenise L Sahmya Arai, LRT/CTRS        Elverda Wendel L 11/06/2017 3:02 PM

## 2017-11-06 NOTE — Progress Notes (Signed)
Patient ID: Carlos French, male   DOB: 13-Apr-1991, 26 y.o.   MRN: [id number]  DAR Note: Pt observed in bed resting with eyes closed. Pt at the time of assessment endorsed moderate anxiety and depression. Pt denied SI, HI, pain or AVH. Pt was med compliant. All patient's questions and concerns addressed. 15-minute safety checks continue. Safety checks continue. Pt did not attend wrap-up group

## 2017-11-06 NOTE — Progress Notes (Signed)
Patient denies SI, HI and AVH.  Patient does report high levels of agitation and irritability due to withdrawals.  Patient states "if you don't hurry up and give me something then my head is going to pop off."  Patient was noted to be clenching his fists face reddened and sweating. Patient received prn medications and was able to calm.  Assess patient for safety, offer medications as prescribed, engage patient in 1:1 staff talks.   Patient able to contract for safety, Continue to monitor as planned.  

## 2017-11-07 DIAGNOSIS — F1411 Cocaine abuse, in remission: Secondary | ICD-10-CM

## 2017-11-07 DIAGNOSIS — F1211 Cannabis abuse, in remission: Secondary | ICD-10-CM

## 2017-11-07 MED ORDER — QUETIAPINE FUMARATE 100 MG PO TABS
100.0000 mg | ORAL_TABLET | Freq: Every day | ORAL | Status: DC
  Filled 2017-11-07: qty 7

## 2017-11-07 MED ORDER — GABAPENTIN 100 MG PO CAPS
100.0000 mg | ORAL_CAPSULE | Freq: Three times a day (TID) | ORAL | Status: DC
  Filled 2017-11-07 (×4): qty 21

## 2017-11-07 MED ORDER — PANTOPRAZOLE SODIUM 20 MG PO TBEC: 20.0000 mg | DELAYED_RELEASE_TABLET | Freq: Every day | ORAL | 0 refills | Status: DC

## 2017-11-07 MED ORDER — MIRTAZAPINE 15 MG PO TABS
15.0000 mg | ORAL_TABLET | Freq: Every day | ORAL | Status: DC
  Filled 2017-11-07: qty 7

## 2017-11-07 MED ORDER — TRAZODONE HCL 50 MG PO TABS
50.0000 mg | ORAL_TABLET | Freq: Every evening | ORAL | Status: DC | PRN
  Filled 2017-11-07 (×2): qty 14

## 2017-11-07 MED ORDER — GABAPENTIN 100 MG PO CAPS: 100.0000 mg | ORAL_CAPSULE | Freq: Three times a day (TID) | ORAL | 0 refills | Status: DC

## 2017-11-07 MED ORDER — QUETIAPINE FUMARATE 100 MG PO TABS: 100.0000 mg | ORAL_TABLET | Freq: Every day | ORAL | 0 refills | Status: DC

## 2017-11-07 MED ORDER — QUETIAPINE FUMARATE 25 MG PO TABS: 25.0000 mg | ORAL_TABLET | Freq: Two times a day (BID) | ORAL | 0 refills | Status: DC | PRN

## 2017-11-07 MED ORDER — TRAZODONE HCL 50 MG PO TABS
50.0000 mg | ORAL_TABLET | Freq: Every evening | ORAL | Status: DC | PRN
  Filled 2017-11-07: qty 7

## 2017-11-07 MED ORDER — TRAZODONE HCL 50 MG PO TABS: 50.0000 mg | ORAL_TABLET | Freq: Every evening | ORAL | 0 refills | Status: DC | PRN

## 2017-11-07 MED ORDER — HYDROXYZINE HCL 25 MG PO TABS: 25.0000 mg | ORAL_TABLET | Freq: Three times a day (TID) | ORAL | 0 refills | Status: DC | PRN

## 2017-11-07 MED ORDER — MIRTAZAPINE 15 MG PO TABS: 15.0000 mg | ORAL_TABLET | Freq: Every day | ORAL | 0 refills | Status: DC

## 2017-11-07 NOTE — Progress Notes (Signed)
Pt still having significant withdrawal symptoms.  He denies SI/HI/AVH.  He has bee in the dayroom most of the evening watching TV and talking with peers.  He came out of AA group early as he said it was upsetting to him, because someone was talking about their brother's death and his brother died recently.  Writer spoke to patient about his meds and what was ordered for tonight.  He was also given prns for withdrawals and anxiety before 9 pm.  Support and encouragement offered.  Discharge plans are in process.  Safety maintained with q15 minute checks.

## 2017-11-07 NOTE — BHH Suicide Risk Assessment (Addendum)
[facility]   Principal Problem: MDD (major depressive disorder), severe [facility](HCC) Discharge Diagnoses:  Patient Active Problem List   Diagnosis Date Noted  . MDD (major depressive disorder), severe (HCC) [F32.2] 11/03/2017  . Depression [F32.9]   . Borderline personality disorder (HCC) [F60.3] 02/10/2016  . Substance induced mood disorder (HCC) [F19.94] 02/09/2016  . Alcohol withdrawal (HCC) [F10.239] 08/23/2015  . Tobacco use disorder [F17.200] 08/23/2015  . Cocaine use disorder, severe, dependence (HCC) [F14.20] 08/11/2015  . Alcohol use disorder, severe, dependence (HCC) [F10.20] 08/11/2015  . Cannabis use disorder, severe, dependence (HCC) [F12.20] 08/11/2015    Total Time spent with patient: 30 minutes  Musculoskeletal: Strength & Muscle Tone: within normal limits Gait & Station: normal Patient leans: N/A  Psychiatric Specialty Exam: ROS denies headache, no chest pain, no shortness of breath  Blood pressure 120/61, pulse (!) 109, temperature (!) 97.4 F (36.3 C), temperature source Oral, resp. rate 18, height 5\' 7"  (1.702 m), weight 51.3 kg (113 lb).Body mass index is 17.7 kg/m.  General Appearance: Well Groomed  Eye Contact::  Good  Speech:  Normal Rate409  Volume:  Normal  Mood:  reports mood is improved, reports mood is " normal"  Affect:  Appropriate and reactive   Thought Process:  Linear and Descriptions of Associations: Intact  Orientation:  Other:  fully alert and attentive  Thought Content:  no halucinations,no delusions   Suicidal Thoughts:  No denies suicidal or self injurious ideations, no homicidal or violent ideations  Homicidal Thoughts:  No  Memory:  recent and remote grossly intact   Judgement:  Other:  improving   Insight:  improving   Psychomotor Activity:  Normal  Concentration:  Good  Recall:  Good  Fund of Knowledge:Good  Language: Good  Akathisia:  Negative  Handed:  Right  AIMS (if indicated):   no abnormal  movements noted or reported   Assets:  Desire for Improvement Resilience  Sleep:  Number of Hours: 6.5  Cognition: WNL  ADL's:  Intact   Mental Status Per Nursing Assessment::   On Admission:     Demographic Factors:  26 year old male, single, lives with GF,  has a 453 year old child who lives with child's grandmother, employed .   Loss Factors: GF's children have medical issues, vehicle recently broke down   Historical Factors: Prior psychiatric admissions, history of depression, history of alcohol abuse   Risk Reduction Factors:   Responsible for children under 27 years of age, Employed, Living with another person, especially a relative and Positive coping skills or problem solving skills  Continued Clinical Symptoms:  Patient presents alert, attentive, well related, pleasant, mood is improved , minimizes depression at this time, affect is appropriate, reactive, vaguely anxious, no thought disorder, no suicidal or self injurious ideations, no homicidal or violent ideations, future oriented . Currently denies significant withdrawal symptoms . Behavior on unit in good control, presents pleasant on approach. Denies medication side effects- we reviewed medication side effects, to include risk of sedation, weight gain, metabolic disturbances .   Cognitive Features That Contribute To Risk:  No gross cognitive deficits noted upon discharge. Is alert , attentive, and oriented x 3    Suicide Risk:  Mild:  Suicidal ideation of limited frequency, intensity, duration, and specificity.  There are no identifiable plans, no associated intent, mild dysphoria and related symptoms, good self-control (both objective and subjective assessment), few other risk factors, and identifiable protective factors, including available and accessible social support.  Follow-up Information    [facility] Follow up.   Why:  Message left with scheduler. Social worker will call you with appt date/time after discharge.  Also, you can walk in Mon-Fri 8am-9am for assessment. Thank you.  Contact information: [address] [place] [address] [phone]           Plan Of Care/Follow-up recommendations:  Activity:  as tolerated  Diet:  Regular Tests:  NA Other:  See below   Patient is expressing readiness for discharge and is leaving in good spirits . Plans to return home. Plans to follow up as above . Reports high level of motivation in remaining sober and working on sobriety.   Craige CottaFernando A Cobos, MD 11/07/2017, 1:07 PM

## 2017-11-07 NOTE — Progress Notes (Signed)
Patient verbalizes readiness for discharge. Follow up plan explained, AVS, transition record and SRA given along with prescriptions. Sample meds provided. All belongings returned. Patient verbalizes understanding. Denies SI/HI and assures this Clinical research associatewriter he will seek assistance should that change. Patient discharged ambulatory and in stable condition to girlfriend.

## 2017-11-07 NOTE — Progress Notes (Signed)
Recreation Therapy Notes  Date:  11/07/17 Time: 0930 Location: [address] Dayroom  Group Topic: Stress Management  Goal Area(s) Addresses:  Patient will verbalize importance of using healthy stress management.  Patient will identify positive emotions associated with healthy stress management.   Behavioral Response: Engaged  Intervention: Stress Management  Activity :  Guided Imagery.  LRT introduced the stress management technique of guided imagery.  LRT read a script to help patients envision their safe and peaceful place.  Patients were to follow along as LRT read script.  Education:  Stress Management, Discharge Planning.   Education Outcome: Acknowledges edcuation/In group clarification offered/Needs additional education  Clinical Observations/Feedback: Pt attended group.    Caroll RancherMarjette Finneus Kaneshiro, LRT/CTRS         Caroll RancherLindsay, Ioannis Schuh A 11/07/2017 12:34 PM

## 2017-11-07 NOTE — Progress Notes (Addendum)
  [facility] :  Will you be returning to the same living situation after discharge:  Yes,  home with girlfriend and kids. At discharge, do you have transportation home?: Yes,  girlfriend Do you have the ability to pay for your medications: Yes,  mental health  Release of information consent forms completed and submitted to medical records by CSW.  Patient to Follow up at: Follow-up Information    [facility] Follow up on 11/08/2017.   Why:  Appt at 10:30AM to be assessed for services. Thank you.  Contact information: [address] [place] [address] [phone]           Next level of care provider [facility]:no  Safety Planning and Suicide Prevention discussed: Yes,  SPE completed with pt's girlfriend. SPI pamphlet and Mobile crisis information also provided to pt.   Have you used any form of tobacco in the last 30 days? (Cigarettes, Smokeless Tobacco, Cigars, and/or Pipes): Yes  Has patient been referred to the Quitline?: Patient refused referral  Patient has been referred for addiction treatment: Yes  Pulte HomesHeather N Smart, LCSW 11/08/2017, 11:10 AM

## 2017-11-07 NOTE — Progress Notes (Signed)
D: Patient observed up and restless on the unit. Asking for meds ASAP. States he is going home today and feels prepared and ready. Patient's affect anxious, mood congruent with slight irritability noted. Per self inventory and discussions with writer, rates depression at a 2/10, hopelessness at a 2/10 and anxiety at a 6/10. Rates sleep as good, appetite as good, energy as normal and concentration as good.  States goal for today is to "go home, talk to doctor." Denies pain, physical problems.   A: Medicated per orders, no prns requested or required. Level III obs in place for safety. Emotional support offered and self inventory reviewed. Encouraged completion of Suicide Safety Plan and programming participation. Discussed POC with MD, SW.   R: Patient verbalizes understanding of POC. Patient denies SI/HI/AVH and remains safe on level III obs. Will continue to monitor closely and make verbal contact frequently. Awaiting discharge.
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
## 2017-11-10 ENCOUNTER — Encounter (HOSPITAL_COMMUNITY): Payer: Self-pay | Admitting: Emergency Medicine

## 2017-11-10 ENCOUNTER — Emergency Department (HOSPITAL_COMMUNITY)
Admission: EM | Admit: 2017-11-10 | Discharge: 2017-11-10 | Disposition: A | Discharge status: Home / Self Care | Payer: Medicaid Other | Attending: Physician Assistant | Admitting: Physician Assistant

## 2017-11-10 ENCOUNTER — Emergency Department (HOSPITAL_COMMUNITY): Payer: Medicaid Other

## 2017-11-10 ENCOUNTER — Other Ambulatory Visit: Payer: Self-pay

## 2017-11-10 DIAGNOSIS — F1220 Cannabis dependence, uncomplicated: Secondary | ICD-10-CM | POA: Insufficient documentation

## 2017-11-10 DIAGNOSIS — F603 Borderline personality disorder: Secondary | ICD-10-CM | POA: Insufficient documentation

## 2017-11-10 DIAGNOSIS — F319 Bipolar disorder, unspecified: Secondary | ICD-10-CM | POA: Insufficient documentation

## 2017-11-10 DIAGNOSIS — F419 Anxiety disorder, unspecified: Secondary | ICD-10-CM | POA: Insufficient documentation

## 2017-11-10 DIAGNOSIS — Z79899 Other long term (current) drug therapy: Secondary | ICD-10-CM | POA: Insufficient documentation

## 2017-11-10 DIAGNOSIS — F1420 Cocaine dependence, uncomplicated: Secondary | ICD-10-CM | POA: Insufficient documentation

## 2017-11-10 DIAGNOSIS — J111 Influenza due to unidentified influenza virus with other respiratory manifestations: Secondary | ICD-10-CM | POA: Insufficient documentation

## 2017-11-10 DIAGNOSIS — R6889 Other general symptoms and signs: Secondary | ICD-10-CM

## 2017-11-10 DIAGNOSIS — F17210 Nicotine dependence, cigarettes, uncomplicated: Secondary | ICD-10-CM | POA: Insufficient documentation

## 2017-11-10 LAB — RAPID STREP SCREEN (MED CTR MEBANE ONLY): Streptococcus, Group A Screen (Direct): NEGATIVE

## 2017-11-10 MED ORDER — IBUPROFEN 400 MG PO TABS
600.0000 mg | ORAL_TABLET | Freq: Once | ORAL | Status: AC
  Administered 2017-11-10: 20:00:00 600 mg via ORAL
  Filled 2017-11-10: qty 1

## 2017-11-10 MED ORDER — GUAIFENESIN-CODEINE 100-10 MG/5ML PO SOLN
10.0000 mL | Freq: Once | ORAL | Status: AC
  Administered 2017-11-10: 10 mL via ORAL
  Filled 2017-11-10: qty 10

## 2017-11-10 MED ORDER — GUAIFENESIN-CODEINE 100-10 MG/5ML PO SYRP: 10.0000 mL | ORAL_SOLUTION | Freq: Three times a day (TID) | ORAL | 0 refills | Status: DC | PRN

## 2017-11-10 NOTE — ED Triage Notes (Signed)
Pt presents to ED for assessment of 2 days of: productive cough, generalized body aches, sore throat.  NAD at triage.

## 2017-11-10 NOTE — ED Provider Notes (Signed)
[facility] Note   CSN: [id number] Arrival date & time: 11/10/17  1842     History   Chief Complaint Chief Complaint  Patient presents with  . Flu-like Symptoms    HPI Carlos French is a 26 y.o. male.  HPI   Carlos French is a 26 year old male with a history of polysubstance abuse, tobacco use, borderline personality disorder, depression who presents emergency department for evaluation of productive cough, sore throat and generalized body aches.  Patient states that his symptoms started two days ago and have gradually worsened.  States that his symptoms began with nasal congestion and progressed into a productive cough with green sputum.  He endorses aching chest pain with cough.  Also endorses total body aches, cramping in his bilateral legs.  He reports 6/10 severity sore throat which is scratchy and worsened with coughing.  He denies fever, nausea/vomiting, headache, shortness of breath, hemoptysis.  Denies sick contacts at home.  Has not tried any over-the-counter medication for his symptoms.  He is other wise able to eat and drink without difficulty.  He has continued to smoke cigarettes despite symptoms.  States that he had a bloody nose four days ago and has residual left nostril bleeding with intermittent nose bleeding.   Past Medical History:  Diagnosis Date  . Allergy   . Anxiety   . Bipolar 1 disorder (HCC)   . Depression   . GERD (gastroesophageal reflux disease)   . GI (gastrointestinal bleed)   . GI bleeding   . PTSD (post-traumatic stress disorder)     Patient Active Problem List   Diagnosis Date Noted  . MDD (major depressive disorder), severe (HCC) 11/03/2017  . Depression   . Borderline personality disorder (HCC) 02/10/2016  . Substance induced mood disorder (HCC) 02/09/2016  . Alcohol withdrawal (HCC) 08/23/2015  . Tobacco use disorder 08/23/2015  . Cocaine use disorder, severe, dependence (HCC) 08/11/2015    . Alcohol use disorder, severe, dependence (HCC) 08/11/2015  . Cannabis use disorder, severe, dependence (HCC) 08/11/2015    History reviewed. No pertinent surgical history.     Home Medications    Prior to Admission medications   Medication Sig Start Date End Date Taking? Authorizing Provider  gabapentin (NEURONTIN) 100 MG capsule Take 1 capsule (100 mg total) 3 (three) times daily by mouth. For withdrawal 11/07/17   Money, Gerlene Burdockravis B, FNP  hydrOXYzine (ATARAX/VISTARIL) 25 MG tablet Take 1 tablet (25 mg total) 3 (three) times daily as needed by mouth for anxiety. 11/07/17   Money, Gerlene Burdockravis B, FNP  mirtazapine (REMERON) 15 MG tablet Take 1 tablet (15 mg total) at bedtime by mouth. For mood control 11/07/17   Money, Feliz Beamravis B, FNP  pantoprazole (PROTONIX) 20 MG tablet Take 1 tablet (20 mg total) daily by mouth. For acid reflux 11/08/17   Money, Gerlene Burdockravis B, FNP  QUEtiapine (SEROQUEL) 100 MG tablet Take 1 tablet (100 mg total) at bedtime by mouth. For mood control 11/07/17   Money, Gerlene Burdockravis B, FNP  QUEtiapine (SEROQUEL) 25 MG tablet Take 1 tablet (25 mg total) 2 (two) times daily as needed by mouth (Agitation). 11/07/17   Money, Gerlene Burdockravis B, FNP  traZODone (DESYREL) 50 MG tablet Take 1 tablet (50 mg total) at bedtime as needed by mouth for sleep. 11/07/17   Money, Gerlene Burdockravis B, FNP    Family History Family History  Problem Relation Age of Onset  . Stroke Mother     Social History Social History  Tobacco Use  . Smoking status: Current Every Day Smoker    Packs/day: 1.00    Types: Cigarettes  . Smokeless tobacco: Former Engineer, waterUser  Substance Use Topics  . Alcohol use: Yes    Comment: daily  . Drug use: Yes    Types: Marijuana, Cocaine    Comment: "over a year"     Allergies   Acetaminophen   Review of Systems Review of Systems  Constitutional: Negative for appetite change, chills, fatigue and fever.  HENT: Positive for congestion and sore throat. Negative for ear pain, facial swelling,  nosebleeds, sinus pressure, sinus pain and trouble swallowing.   Respiratory: Negative for shortness of breath.   Cardiovascular: Positive for chest pain (with cough). Negative for palpitations.  Gastrointestinal: Negative for abdominal pain, diarrhea, nausea and vomiting.  Genitourinary: Negative for difficulty urinating.  Musculoskeletal: Positive for myalgias (total body). Negative for gait problem.  Skin: Negative for rash.  Neurological: Negative for weakness, numbness and headaches.  Psychiatric/Behavioral: Negative for agitation.     Physical Exam Updated Vital Signs BP 110/68   Pulse 74   Temp 97.9 F (36.6 C) (Oral)   Resp 16   Ht 5\' 6"  (1.676 m)   Wt 56.7 kg (125 lb)   SpO2 97%   BMI 20.18 kg/m   Physical Exam  Constitutional: He appears well-developed and well-nourished. No distress.  HENT:  Head: Normocephalic and atraumatic.  Right Ear: External ear normal.  Left Ear: External ear normal.  Bilateral TMs with good cone of light.  Mucas membranes moist.  Oropharynx with mild erythema.  No tonsillar exudate.  Uvula midline.  No trismus.  Patient able to handle oral secretions.  Airway intact.  Right nare with a small spot of crusted blood.  No active bleeding.  Clear rhinorrhea noted in bilateral nares.   Eyes: Conjunctivae are normal. Pupils are equal, round, and reactive to light. Right eye exhibits no discharge. Left eye exhibits no discharge.  Neck: Normal range of motion. Neck supple.  Cardiovascular: Normal rate, regular rhythm and intact distal pulses. Exam reveals no friction rub.  No murmur heard. Pulmonary/Chest: Effort normal. No stridor. No respiratory distress. He has no wheezes. He has no rales.  Grossly tender to palpation over entire anterior chest wall.  No rash, erythema, ecchymosis overlying.  Abdominal: Soft. Bowel sounds are normal. He exhibits no distension. There is no tenderness. There is no guarding.  Musculoskeletal: Normal range of motion.    Tenderness grossly over bilateral arms and legs, no point tenderness.   Lymphadenopathy:    He has no cervical adenopathy.  Neurological: He is alert. Coordination normal.  Skin: Skin is warm and dry. Capillary refill takes less than 2 seconds. He is not diaphoretic.  Psychiatric: He has a normal mood and affect. His behavior is normal.  Nursing note and vitals reviewed.    ED Treatments / Results  Labs (all labs ordered are listed, but only abnormal results are displayed) Labs Reviewed  RAPID STREP SCREEN (NOT AT [facility])  CULTURE, GROUP A STREP [facility])    EKG  EKG Interpretation None       Radiology Dg Chest 2 View  Result Date: 11/10/2017 CLINICAL DATA:  Productive cough. EXAM: CHEST  2 VIEW COMPARISON:  October 25, 2017 FINDINGS: The heart size and mediastinal contours are within normal limits. Both lungs are clear. The visualized skeletal structures are unremarkable. IMPRESSION: No active cardiopulmonary disease. Electronically Signed   By: Gerome Samavid  Williams III M.D   On:  11/10/2017 19:57    Procedures Procedures (including critical care time)  Medications Ordered in ED Medications  ibuprofen (ADVIL,MOTRIN) tablet 600 mg (not administered)     Initial Impression / Assessment and Plan / ED Course  I have reviewed the triage vital signs and the nursing notes.  Pertinent labs & imaging results that were available during my care of the patient were reviewed by me and considered in my medical decision making (see chart for details).  Clinical Course as of Nov 10 2010  Sat Nov 10, 2017  2011 When offered ibuprofen in the ER, patient states that he cannot take this because he had a GI bleed in the past.  He states that he is allergic to acetaminophen.  [ES]    Clinical Course User Index [ES] Kellie Shropshire, PA-C    Patient with symptoms consistent with influenza.  Vitals are stable.  No signs of dehydration, tolerating PO's. Lungs are clear. Chest Xray without  pneumonia. Discussed the cost versus benefit of Tamiflu treatment with the patient who agrees that he does not want it at this time.   Strep test ordered given sore throat. It is negative. Presentation non concerning for PTA or RPA. No trismus or uvula deviation. No abx indicated. Specific return precautions discussed. Patient will be discharged with instructions to orally hydrate, rest, and has been given prescription for a cough suppressant.    Final Clinical Impressions(s) / ED Diagnoses   Final diagnoses:  Flu-like symptoms    ED Discharge Orders        Ordered    guaiFENesin-codeine [facility] AC) 100-10 MG/5ML syrup  3 times daily PRN     11/10/17 2050       Kellie Shropshire, PA-C 11/11/17 0124    Abelino Derrick, MD 11/11/17 2151

## 2017-11-10 NOTE — ED Notes (Signed)
Pt ambulated to restroom unassisted 

## 2017-11-10 NOTE — Discharge Instructions (Signed)
Chest x-ray was normal, no pneumonia.  Your strep test was negative.  Your symptoms are consistent with the flu.  This is a viral infection, no antibiotics will treat it.  Please get plenty of rest, hydrate with plenty of fluids for the next several days.  I have written you a prescription for a cough medicine.  It can make you drowsy, please do not drive, work or drink alcohol while taking this medicine.  Avoid cigarettes during this time of cough, as it is likely to make the cough worse.  Return to the emergency department if you have cough with shortness of breath, or vomiting that does not stop.  Please also return for any new or worsening symptoms.

## 2017-11-10 NOTE — ED Notes (Signed)
Pt understood dc material. NAD noted. Scripts given at dc 

## 2017-11-12 LAB — CULTURE, GROUP A STREP (THRC)

## 2017-11-13 ENCOUNTER — Telehealth: Payer: Self-pay | Admitting: Emergency Medicine

## 2017-11-13 NOTE — Telephone Encounter (Signed)
Post ED Visit - Positive Culture Follow-up: Successful Patient Follow-Up  Culture assessed and recommendations reviewed by: []  Enzo BiNathan Batchelder, Pharm.D. []  Celedonio MiyamotoJeremy Frens, Pharm.D., BCPS AQ-ID []  Garvin FilaMike Maccia, Pharm.D., BCPS []  Georgina PillionElizabeth Martin, Pharm.D., BCPS []  AkutanMinh Pham, [address]Pharm.D., BCPS, AAHIVP []  Estella HuskMichelle Turner, Pharm.D., BCPS, AAHIVP []  Lysle Pearlachel Rumbarger, PharmD, BCPS []  Casilda Carlsaylor Stone, PharmD, BCPS []  Pollyann SamplesAndy Johnston, PharmD, BCPS  Positive strep culture  [x]  Patient discharged without antimicrobial prescription and treatment is now indicated []  Organism is resistant to prescribed ED discharge antimicrobial []  Patient with positive blood cultures  Changes discussed with ED provider: Burna FortsJeff Hedges PA New antibiotic prescription start Amoxicillin 500mg  po twice daily x 10 days [facility] @ [phone]  Contacted patient, 11/13/2017 1359   Berle MullMiller, Samera Macy 11/13/2017, 1:58 PM

## 2017-11-14 ENCOUNTER — Encounter (HOSPITAL_COMMUNITY): Payer: Self-pay | Admitting: Emergency Medicine

## 2017-11-14 ENCOUNTER — Emergency Department (HOSPITAL_COMMUNITY)
Admission: EM | Admit: 2017-11-14 | Discharge: 2017-11-14 | Disposition: A | Discharge status: Home / Self Care | Payer: Medicaid Other | Attending: Emergency Medicine | Admitting: Emergency Medicine

## 2017-11-14 ENCOUNTER — Encounter (HOSPITAL_COMMUNITY): Payer: Self-pay | Admitting: Family Medicine

## 2017-11-14 ENCOUNTER — Emergency Department (HOSPITAL_COMMUNITY)
Admission: EM | Admit: 2017-11-14 | Discharge: 2017-11-15 | Disposition: A | Discharge status: Other Acute Inpatient Hospital | Payer: Medicaid Other | Attending: Emergency Medicine | Admitting: Emergency Medicine

## 2017-11-14 DIAGNOSIS — Y908 Blood alcohol level of 240 mg/100 ml or more: Secondary | ICD-10-CM | POA: Insufficient documentation

## 2017-11-14 DIAGNOSIS — R45851 Suicidal ideations: Secondary | ICD-10-CM | POA: Insufficient documentation

## 2017-11-14 DIAGNOSIS — F1010 Alcohol abuse, uncomplicated: Secondary | ICD-10-CM

## 2017-11-14 DIAGNOSIS — F1994 Other psychoactive substance use, unspecified with psychoactive substance-induced mood disorder: Secondary | ICD-10-CM | POA: Diagnosis present

## 2017-11-14 DIAGNOSIS — F10120 Alcohol abuse with intoxication, uncomplicated: Secondary | ICD-10-CM | POA: Insufficient documentation

## 2017-11-14 DIAGNOSIS — F332 Major depressive disorder, recurrent severe without psychotic features: Secondary | ICD-10-CM

## 2017-11-14 DIAGNOSIS — Z599 Problem related to housing and economic circumstances, unspecified: Secondary | ICD-10-CM

## 2017-11-14 DIAGNOSIS — F1020 Alcohol dependence, uncomplicated: Secondary | ICD-10-CM | POA: Diagnosis present

## 2017-11-14 DIAGNOSIS — Z79899 Other long term (current) drug therapy: Secondary | ICD-10-CM | POA: Insufficient documentation

## 2017-11-14 DIAGNOSIS — F4310 Post-traumatic stress disorder, unspecified: Secondary | ICD-10-CM | POA: Insufficient documentation

## 2017-11-14 DIAGNOSIS — R440 Auditory hallucinations: Secondary | ICD-10-CM | POA: Insufficient documentation

## 2017-11-14 DIAGNOSIS — F17210 Nicotine dependence, cigarettes, uncomplicated: Secondary | ICD-10-CM | POA: Insufficient documentation

## 2017-11-14 DIAGNOSIS — R4587 Impulsiveness: Secondary | ICD-10-CM

## 2017-11-14 DIAGNOSIS — F319 Bipolar disorder, unspecified: Secondary | ICD-10-CM | POA: Insufficient documentation

## 2017-11-14 DIAGNOSIS — F329 Major depressive disorder, single episode, unspecified: Secondary | ICD-10-CM | POA: Insufficient documentation

## 2017-11-14 DIAGNOSIS — Z046 Encounter for general psychiatric examination, requested by authority: Secondary | ICD-10-CM | POA: Insufficient documentation

## 2017-11-14 HISTORY — DX: Suicidal ideations: R45.851

## 2017-11-14 LAB — SALICYLATE LEVEL: Salicylate Lvl: <7 mg/dL (ref 2.8–30.0)

## 2017-11-14 LAB — CBC
HEMATOCRIT: 43.1 % (ref 39.0–52.0)
HEMATOCRIT: 43.7 % (ref 39.0–52.0)
HEMOGLOBIN: 15.1 g/dL (ref 13.0–17.0)
HEMOGLOBIN: 15.2 g/dL (ref 13.0–17.0)
MCH: 32.5 pg (ref 26.0–34.0)
MCH: 32.4 pg (ref 26.0–34.0)
MCHC: 35 g/dL (ref 30.0–36.0)
MCHC: 34.8 g/dL (ref 30.0–36.0)
MCV: 92.9 fL (ref 78.0–100.0)
MCV: 93.2 fL (ref 78.0–100.0)
Platelets: 187 10*3/uL (ref 150–400)
Platelets: 200 10*3/uL (ref 150–400)
RBC: 4.64 MIL/uL (ref 4.22–5.81)
RBC: 4.69 MIL/uL (ref 4.22–5.81)
RDW: 13.3 % (ref 11.5–15.5)
RDW: 13.2 % (ref 11.5–15.5)
WBC: 7 10*3/uL (ref 4.0–10.5)
WBC: 5.6 10*3/uL (ref 4.0–10.5)

## 2017-11-14 LAB — COMPREHENSIVE METABOLIC PANEL
ALBUMIN: 4.3 g/dL (ref 3.5–5.0)
ALBUMIN: 3.9 g/dL (ref 3.5–5.0)
ALK PHOS: 68 U/L (ref 38–126)
ALT: 33 U/L (ref 17–63)
ALT: 31 U/L (ref 17–63)
ANION GAP: 10 (ref 5–15)
AST: 22 U/L (ref 15–41)
AST: 23 U/L (ref 15–41)
Alkaline Phosphatase: 71 U/L (ref 38–126)
Anion gap: 9 (ref 5–15)
BUN: 9 mg/dL (ref 6–20)
BUN: 10 mg/dL (ref 6–20)
CALCIUM: 8.5 mg/dL — AB (ref 8.9–10.3)
CO2: 25 mmol/L (ref 22–32)
CO2: 24 mmol/L (ref 22–32)
CREATININE: 0.78 mg/dL (ref 0.61–1.24)
Calcium: 8.7 mg/dL — ABNORMAL LOW (ref 8.9–10.3)
Chloride: 101 mmol/L (ref 101–111)
Chloride: 101 mmol/L (ref 101–111)
Creatinine, Ser: 0.92 mg/dL (ref 0.61–1.24)
GFR calc Af Amer: >60 mL/min (ref >60)
GFR calc non Af Amer: >60 mL/min (ref >60)
GFR calc non Af Amer: >60 mL/min (ref >60)
GLUCOSE: 92 mg/dL (ref 65–99)
GLUCOSE: 86 mg/dL (ref 65–99)
POTASSIUM: 3.7 mmol/L (ref 3.5–5.1)
Potassium: 3.4 mmol/L — ABNORMAL LOW (ref 3.5–5.1)
SODIUM: 135 mmol/L (ref 135–145)
SODIUM: 135 mmol/L (ref 135–145)
Total Bilirubin: 0.3 mg/dL (ref 0.3–1.2)
Total Bilirubin: 0.6 mg/dL (ref 0.3–1.2)
Total Protein: 7.8 g/dL (ref 6.5–8.1)
Total Protein: 7.2 g/dL (ref 6.5–8.1)

## 2017-11-14 LAB — RAPID URINE DRUG SCREEN, HOSP PERFORMED
AMPHETAMINES: NOT DETECTED
Amphetamines: NOT DETECTED
BARBITURATES: NOT DETECTED
BENZODIAZEPINES: POSITIVE — AB
BENZODIAZEPINES: POSITIVE — AB
Barbiturates: NOT DETECTED
COCAINE: NOT DETECTED
COCAINE: NOT DETECTED
OPIATES: NOT DETECTED
Opiates: NOT DETECTED
TETRAHYDROCANNABINOL: POSITIVE — AB
TETRAHYDROCANNABINOL: POSITIVE — AB

## 2017-11-14 LAB — ACETAMINOPHEN LEVEL

## 2017-11-14 LAB — ETHANOL
Alcohol, Ethyl (B): 260 mg/dL — ABNORMAL HIGH (ref <10)
Alcohol, Ethyl (B): 18 mg/dL — ABNORMAL HIGH (ref <10)

## 2017-11-14 MED ORDER — LORAZEPAM 1 MG PO TABS
0.0000 mg | ORAL_TABLET | Freq: Two times a day (BID) | ORAL | Status: DC
Start: 2017-11-17 — End: 2017-11-15

## 2017-11-14 MED ORDER — MIRTAZAPINE 15 MG PO TABS
15.0000 mg | ORAL_TABLET | Freq: Every day | ORAL | Status: DC
  Administered 2017-11-14: 15 mg via ORAL
  Filled 2017-11-14: qty 1

## 2017-11-14 MED ORDER — ALUM & MAG HYDROXIDE-SIMETH 200-200-20 MG/5ML PO SUSP: 30.0000 mL | Freq: Four times a day (QID) | ORAL | Status: DC | PRN

## 2017-11-14 MED ORDER — LORAZEPAM 2 MG/ML IJ SOLN: 0.0000 mg | Freq: Two times a day (BID) | INTRAMUSCULAR | Status: DC

## 2017-11-14 MED ORDER — NICOTINE 21 MG/24HR TD PT24
21.0000 mg | MEDICATED_PATCH | Freq: Every day | TRANSDERMAL | Status: DC
  Administered 2017-11-14 – 2017-11-15 (×2): 21 mg via TRANSDERMAL
  Filled 2017-11-14 (×2): qty 1

## 2017-11-14 MED ORDER — HYDROXYZINE HCL 25 MG PO TABS: 25.0000 mg | ORAL_TABLET | Freq: Three times a day (TID) | ORAL | Status: DC | PRN

## 2017-11-14 MED ORDER — QUETIAPINE FUMARATE 200 MG PO TABS
200.0000 mg | ORAL_TABLET | Freq: Every day | ORAL | Status: DC
  Administered 2017-11-14: 200 mg via ORAL
  Filled 2017-11-14: qty 1

## 2017-11-14 MED ORDER — VITAMIN B-1 100 MG PO TABS: 100.0000 mg | ORAL_TABLET | Freq: Every day | ORAL | Status: DC

## 2017-11-14 MED ORDER — LORAZEPAM 2 MG/ML IJ SOLN: 0.0000 mg | Freq: Four times a day (QID) | INTRAMUSCULAR | Status: DC

## 2017-11-14 MED ORDER — LORAZEPAM 1 MG PO TABS
0.0000 mg | ORAL_TABLET | Freq: Four times a day (QID) | ORAL | Status: DC
  Administered 2017-11-15: 1 mg via ORAL
  Administered 2017-11-15: 2 mg via ORAL
  Filled 2017-11-14: qty 2
  Filled 2017-11-14: qty 1

## 2017-11-14 MED ORDER — NICOTINE 21 MG/24HR TD PT24
21.0000 mg | MEDICATED_PATCH | Freq: Every day | TRANSDERMAL | Status: DC
  Administered 2017-11-14: 21 mg via TRANSDERMAL
  Filled 2017-11-14: qty 1

## 2017-11-14 MED ORDER — PANTOPRAZOLE SODIUM 20 MG PO TBEC
20.0000 mg | DELAYED_RELEASE_TABLET | Freq: Every day | ORAL | Status: DC
  Administered 2017-11-14 – 2017-11-15 (×2): 20 mg via ORAL
  Filled 2017-11-14 (×3): qty 1

## 2017-11-14 MED ORDER — VITAMIN B-1 100 MG PO TABS
100.0000 mg | ORAL_TABLET | Freq: Every day | ORAL | Status: DC
  Administered 2017-11-14 – 2017-11-15 (×2): 100 mg via ORAL
  Filled 2017-11-14 (×2): qty 1

## 2017-11-14 MED ORDER — ONDANSETRON HCL 4 MG PO TABS: 4.0000 mg | ORAL_TABLET | Freq: Three times a day (TID) | ORAL | Status: DC | PRN

## 2017-11-14 MED ORDER — LORAZEPAM 1 MG PO TABS: 0.0000 mg | ORAL_TABLET | Freq: Four times a day (QID) | ORAL | Status: DC

## 2017-11-14 MED ORDER — GABAPENTIN 100 MG PO CAPS
100.0000 mg | ORAL_CAPSULE | Freq: Three times a day (TID) | ORAL | Status: DC
  Filled 2017-11-14: qty 1

## 2017-11-14 MED ORDER — THIAMINE HCL 100 MG/ML IJ SOLN: 100.0000 mg | Freq: Every day | INTRAMUSCULAR | Status: DC

## 2017-11-14 MED ORDER — LORAZEPAM 1 MG PO TABS: 0.0000 mg | ORAL_TABLET | Freq: Two times a day (BID) | ORAL | Status: DC

## 2017-11-14 MED ORDER — TRAZODONE HCL 50 MG PO TABS: 50.0000 mg | ORAL_TABLET | Freq: Every evening | ORAL | Status: DC | PRN
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
## 2017-11-14 NOTE — Discharge Instructions (Signed)
For your mental health needs, you are advised to follow up with [facility].  New and returning patients are seen at their walk-in clinic.  Walk-in hours are Monday - Friday from 8:00 am - 3:00 pm.  Walk-in patients are seen on a first come, first served basis.  Try to arrive as early as possible for he best chance of being seen the same day: ° °     [facility] °     [address] °     [place], [place] [address] °     [phone] ° °To help you maintain a sober lifestyle, a substance abuse treatment program may be beneficial to you.  Contact [facility] at your earliest opportunity to ask about enrolling in their program: ° °     [facility] ([facility] °     [address] °     [place], [place] [address] °     [phone] °     New patients are seen at the walk-in clinic every Tuesday from 9:00 am - 12:00 pm °

## 2017-11-14 NOTE — ED Notes (Signed)
TTS now 

## 2017-11-14 NOTE — ED Notes (Signed)
Security notified to wand pt 

## 2017-11-14 NOTE — ED Provider Notes (Signed)
Emergency Department Provider Note   I have reviewed the triage vital signs and the nursing notes.   HISTORY  Chief Complaint Suicidal   HPI Carlos French is a 26 y.o. male with PMH of Bipolar disorder and PTSD presents to the emergency department for evaluation of suicidal ideation in the setting of worsening depression.  He was IVC'd by family this evening after verbalizing a plan to hang himself with a belt.  He also endorses auditory hallucinations of voices telling him that he is worthless.  Patient states that his depression has been worsening significantly over the past 3-4 days.  He has been compliant with his home medications for depression.  [facility] is [facility].  He does not identify any particular trigger for his symptoms.  He drinks alcohol daily but denies other drugs.  He denies any history of complicated alcohol withdrawal.    Past Medical History:  Diagnosis Date  . Allergy   . Anxiety   . Bipolar 1 disorder (HCC)   . Depression   . GERD (gastroesophageal reflux disease)   . GI (gastrointestinal bleed)   . GI bleeding   . PTSD (post-traumatic stress disorder)   . Suicidal ideation     Patient Active Problem List   Diagnosis Date Noted  . MDD (major depressive disorder), severe (HCC) 11/03/2017  . Depression   . Borderline personality disorder (HCC) 02/10/2016  . Substance induced mood disorder (HCC) 02/09/2016  . Alcohol withdrawal (HCC) 08/23/2015  . Tobacco use disorder 08/23/2015  . Cocaine use disorder, severe, dependence (HCC) 08/11/2015  . Alcohol use disorder, severe, dependence (HCC) 08/11/2015  . Cannabis use disorder, severe, dependence (HCC) 08/11/2015    History reviewed. No pertinent surgical history.  Current Outpatient Rx  . Order #: [id number] Class: Print  . Order #: [id number] Class: Print  . Order #: [id number] Class: Print  . Order #: [id number] Class: Print  . Order #: [id number] Class: Print  . Order #: [id number] Class: Print  .  Order #: [id number] Class: Print  . Order #: [id number] Class: Print    Allergies Acetaminophen and Ibuprofen  Family History  Problem Relation Age of Onset  . Stroke Mother     Social History Social History   Tobacco Use  . Smoking status: Current Every Day Smoker    Packs/day: 0.00    Types: Cigarettes  . Smokeless tobacco: Former Engineer, waterUser  Substance Use Topics  . Alcohol use: Yes    Comment: Daily. Reports he drinks 3-4 25oz beers a day.  Last drink: 1 hour ago.   . Drug use: No    Comment: Has a history. States, "I don't do drugs anymore".     Review of Systems  Constitutional: No fever/chills. Positive suicidal ideation.  Eyes: No visual changes. ENT: No sore throat. Cardiovascular: Denies chest pain. Respiratory: Denies shortness of breath. Gastrointestinal: No abdominal pain.  No nausea, no vomiting.  No diarrhea.  No constipation. Genitourinary: Negative for dysuria. Musculoskeletal: Negative for back pain.  Skin: Negative for rash. Neurological: Negative for headaches, focal weakness or numbness.  10-point ROS otherwise negative.  ____________________________________________   PHYSICAL EXAM:  VITAL SIGNS: ED Triage Vitals [11/14/17 1939]  Enc Vitals Group     BP (!) 123/92     Pulse Rate 95     Resp 16     Temp 98.3 F (36.8 C)     Temp Source Oral     SpO2 99 %    Constitutional: Alert and oriented. Well appearing and in  no acute distress. Eyes: Conjunctivae are normal.  Head: Atraumatic. Nose: No congestion/rhinnorhea. Mouth/Throat: Mucous membranes are moist.  Oropharynx non-erythematous. Neck: No stridor.  Cardiovascular: Normal rate, regular rhythm. Good peripheral circulation. Grossly normal heart sounds.   Respiratory: Normal respiratory effort.  No retractions. Lungs CTAB. Gastrointestinal: Soft and nontender. No distention.  Musculoskeletal: No lower extremity tenderness nor edema. No gross deformities of extremities. Neurologic:  Normal  speech and language. No gross focal neurologic deficits are appreciated.  Skin:  Skin is warm, dry and intact. No rash noted. Psychiatric: Mood and affect are flat. Speech and behavior are normal. Does not appear to be responding to internal stimuli.   ____________________________________________   LABS (all labs ordered are listed, but only abnormal results are displayed)  Labs Reviewed  COMPREHENSIVE METABOLIC PANEL - Abnormal; Notable for the following components:      Result Value   Calcium 8.7 (*)    All other components within normal limits  ETHANOL - Abnormal; Notable for the following components:   Alcohol, Ethyl (B) 18 (*)    All other components within normal limits  ACETAMINOPHEN LEVEL - Abnormal; Notable for the following components:   Acetaminophen (Tylenol), Serum <10 (*)    All other components within normal limits  RAPID URINE DRUG SCREEN, HOSP PERFORMED - Abnormal; Notable for the following components:   Benzodiazepines POSITIVE (*)    Tetrahydrocannabinol POSITIVE (*)    All other components within normal limits  SALICYLATE LEVEL  CBC   ____________________________________________  RADIOLOGY  None ____________________________________________   PROCEDURES  Procedure(s) performed:   Procedures  None ____________________________________________   INITIAL IMPRESSION / ASSESSMENT AND PLAN / ED COURSE  Pertinent labs & imaging results that were available during my care of the patient were reviewed by me and considered in my medical decision making (see chart for details).  Patient presents to the emergency department for evaluation of depression and suicidal ideation.  He has a plan to hang himself with a belt and is hearing voices telling him that he is worthless.  IVC paperwork filed by family.  I have completed the first exam component of the IVC.  I will place the patient on CIWA protocol given his alcohol use.  I have restarted home medications.   After review of his labs the patient is psychiatric evaluation.   ____________________________________________  FINAL CLINICAL IMPRESSION(S) / ED DIAGNOSES  Final diagnoses:  Suicidal ideation     MEDICATIONS GIVEN DURING THIS VISIT:  Medications  LORazepam (ATIVAN) injection 0-4 mg (not administered)    Or  LORazepam (ATIVAN) tablet 0-4 mg (not administered)  LORazepam (ATIVAN) injection 0-4 mg (not administered)    Or  LORazepam (ATIVAN) tablet 0-4 mg (not administered)  thiamine (VITAMIN B-1) tablet 100 mg (not administered)    Or  thiamine (B-1) injection 100 mg (not administered)  ondansetron (ZOFRAN) tablet 4 mg (not administered)  alum & mag hydroxide-simeth (MAALOX/MYLANTA) 200-200-20 MG/5ML suspension 30 mL (not administered)  nicotine (NICODERM CQ - dosed in mg/24 hours) patch 21 mg (not administered)  gabapentin (NEURONTIN) capsule 100 mg (not administered)  hydrOXYzine (ATARAX/VISTARIL) tablet 25 mg (not administered)  mirtazapine (REMERON) tablet 15 mg (not administered)  pantoprazole (PROTONIX) EC tablet 20 mg (not administered)  QUEtiapine (SEROQUEL) tablet 200 mg (not administered)  traZODone (DESYREL) tablet 50 mg (not administered)    Note:  This document was prepared using Dragon voice recognition software and may include unintentional dictation errors.  Alona BeneJoshua Long, MD Emergency Medicine  Maia Plan, MD 11/14/17 2155

## 2017-11-14 NOTE — ED Triage Notes (Signed)
[place]. Patient is reporting he experiencing suicidal thoughts with a plan to hang himself. Thoughts started 3 days with continuous thoughts. Patient appears to be under the influence of substance abuse. He is calm, cooperative.

## 2017-11-14 NOTE — ED Triage Notes (Signed)
Patient arrived with sheriff officer handcuffed reports suicidal ideation - plans to hang himself , denies hallucinations . Staffing office notified for his sitter , maroon colored paper scrubs given to pt, pt.'s clothes/personal belongings bagged/labelled .

## 2017-11-14 NOTE — BH Assessment (Signed)
[facility] Assessment Progress Note  Per Thedore MinsMojeed Akintayo, MD, this pt does not require psychiatric hospitalization at this time.  [facility] with [facility] to [facility] with [facility] and with [facility].  This has been included in pt's discharge instructions.  Pt would also benefit from seeing Peer Support Specialists; they will be asked to speak to pt.  Pt's nurse has been notified.  Carlos Canninghomas Kali Deadwyler, MA Triage Specialist [phone]

## 2017-11-14 NOTE — ED Notes (Signed)
With TTS 
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
## 2017-11-14 NOTE — ED Notes (Signed)
Patient wanded by security. 

## 2017-11-14 NOTE — ED Provider Notes (Signed)
[facility]-EMERGENCY DEPT Provider Note   CSN: [id number] Arrival date & time: 11/14/17  0137     History   Chief Complaint Chief Complaint  Patient presents with  . Psychiatric Evaluation    HPI VASILY FEDEWA is a 26 y.o. male.  HPI  FERRIS FIELDEN is a 26 y.o. male with history of bipolar disorder, depression, PTSD, presents to emergency department complaining of suicidal thoughts.  Patient brought in by police officer.  Patient states that he has had suicidal thoughts for approximately 3 days and had a plan of hanging himself.  Patient admits to alcohol use.  Patient does not wish to provide any more details to his illness.  No homicidal ideations. Hx of visit for the same with inpatient admission 2wks ago  Past Medical History:  Diagnosis Date  . Allergy   . Anxiety   . Bipolar 1 disorder (HCC)   . Depression   . GERD (gastroesophageal reflux disease)   . GI (gastrointestinal bleed)   . GI bleeding   . PTSD (post-traumatic stress disorder)     Patient Active Problem List   Diagnosis Date Noted  . MDD (major depressive disorder), severe (HCC) 11/03/2017  . Depression   . Borderline personality disorder (HCC) 02/10/2016  . Substance induced mood disorder (HCC) 02/09/2016  . Alcohol withdrawal (HCC) 08/23/2015  . Tobacco use disorder 08/23/2015  . Cocaine use disorder, severe, dependence (HCC) 08/11/2015  . Alcohol use disorder, severe, dependence (HCC) 08/11/2015  . Cannabis use disorder, severe, dependence (HCC) 08/11/2015    History reviewed. No pertinent surgical history.     Home Medications    Prior to Admission medications   Medication Sig Start Date End Date Taking? Authorizing Provider  guaiFENesin-codeine (ROBITUSSIN AC) 100-10 MG/5ML syrup Take 10 mLs 3 (three) times daily as needed by mouth for cough. 11/10/17  Yes Kellie Shropshire, PA-C  hydrOXYzine (ATARAX/VISTARIL) 25 MG tablet Take 1 tablet (25 mg total) 3 (three)  times daily as needed by mouth for anxiety. 11/07/17  Yes Money, Gerlene Burdock, FNP  QUEtiapine (SEROQUEL) 100 MG tablet Take 1 tablet (100 mg total) at bedtime by mouth. For mood control Patient taking differently: Take 200 mg by mouth at bedtime. For mood control 11/07/17  Yes Money, Gerlene Burdock, FNP  QUEtiapine (SEROQUEL) 25 MG tablet Take 1 tablet (25 mg total) 2 (two) times daily as needed by mouth (Agitation). 11/07/17  Yes Money, Gerlene Burdock, FNP  traZODone (DESYREL) 50 MG tablet Take 1 tablet (50 mg total) at bedtime as needed by mouth for sleep. 11/07/17  Yes Money, Gerlene Burdock, FNP  gabapentin (NEURONTIN) 100 MG capsule Take 1 capsule (100 mg total) 3 (three) times daily by mouth. For withdrawal 11/07/17   Money, Gerlene Burdock, FNP  mirtazapine (REMERON) 15 MG tablet Take 1 tablet (15 mg total) at bedtime by mouth. For mood control 11/07/17   Money, Feliz Beam B, FNP  pantoprazole (PROTONIX) 20 MG tablet Take 1 tablet (20 mg total) daily by mouth. For acid reflux 11/08/17   Money, Gerlene Burdock, FNP    Family History Family History  Problem Relation Age of Onset  . Stroke Mother     Social History Social History   Tobacco Use  . Smoking status: Current Every Day Smoker    Packs/day: 1.00    Types: Cigarettes  . Smokeless tobacco: Former Engineer, water Use Topics  . Alcohol use: Yes    Comment: Daily. Reports he drinks 3-4 25oz  beers a day.  Last drink: 1 hour ago.   . Drug use: No    Comment: Has a history. States, "I don't do drugs anymore".      Allergies   Acetaminophen and Ibuprofen   Review of Systems Review of Systems  Constitutional: Negative for chills and fever.  Neurological: Negative for headaches.  Psychiatric/Behavioral: Positive for behavioral problems, dysphoric mood and suicidal ideas. Negative for self-injury. The patient is nervous/anxious.   All other systems reviewed and are negative.    Physical Exam Updated Vital Signs BP (!) 90/53 (BP Location: Left Arm)   Pulse  91   Temp 98.1 F (36.7 C) (Oral)   Resp 18   Ht 5\' 6"  (1.676 m)   Wt 59 kg (130 lb)   SpO2 96%   BMI 20.98 kg/m   Physical Exam  Constitutional: He appears well-developed and well-nourished.  HENT:  Head: Normocephalic and atraumatic.  Eyes: Conjunctivae are normal.  Neck: Neck supple.  Cardiovascular: Normal rate, regular rhythm and normal heart sounds.  Pulmonary/Chest: Effort normal. No respiratory distress. He has no wheezes. He has no rales.  Abdominal: Soft. Bowel sounds are normal. He exhibits no distension. There is no tenderness. There is no rebound.  Musculoskeletal: He exhibits no edema.  Neurological: He is alert.  Skin: Skin is warm and dry.  Psychiatric:  Appears to be intoxicated. Flat affect. Reports suicidal thoughts  Nursing note and vitals reviewed.    ED Treatments / Results  Labs (all labs ordered are listed, but only abnormal results are displayed) Labs Reviewed  COMPREHENSIVE METABOLIC PANEL - Abnormal; Notable for the following components:      Result Value   Potassium 3.4 (*)    Calcium 8.5 (*)    All other components within normal limits  ETHANOL - Abnormal; Notable for the following components:   Alcohol, Ethyl (B) 260 (*)    All other components within normal limits  ACETAMINOPHEN LEVEL - Abnormal; Notable for the following components:   Acetaminophen (Tylenol), Serum <10 (*)    All other components within normal limits  SALICYLATE LEVEL  CBC  RAPID URINE DRUG SCREEN, HOSP PERFORMED    EKG  EKG Interpretation None       Radiology No results found.  Procedures Procedures (including critical care time)  Medications Ordered in ED Medications - No data to display   Initial Impression / Assessment and Plan / ED Course  I have reviewed the triage vital signs and the nursing notes.  Pertinent labs & imaging results that were available during my care of the patient were reviewed by me and considered in my medical decision making  (see chart for details).     Patient is a poor historian, most likely due to his intoxication.  He does report alcohol use.  He is here for suicidal thoughts for 3 days with a plan to hang himself.  He had recent admission to behavioral health for the same, just 2 weeks ago.  Will get medical clearance labs and TTS consult.  Patient is voluntary, calm and cooperative at this time.  Alcohol 260, otherwise unremarkable labs. Drug screen pending. Plan to consult TTS.   4:58 AM Seen by TTS, recommend observation and recheck in AM. Holding orders placed.   Vitals:   11/14/17 0151 11/14/17 0156  BP:  (!) 90/53  Pulse:  91  Resp:  18  Temp:  98.1 F (36.7 C)  TempSrc:  Oral  SpO2:  96%  Weight:  59 kg (130 lb)   Height: 5\' 6"  (1.676 m)      Final Clinical Impressions(s) / ED Diagnoses   Final diagnoses:  Suicidal ideation  Alcohol abuse    ED Discharge Orders    None       Jaynie CrumbleKirichenko, Rashidah [place], PA-C 11/14/17 [id number]    Ward, Layla MawKristen N, DO 11/14/17 [phone]

## 2017-11-14 NOTE — BHH Suicide Risk Assessment (Signed)
Suicide Risk Assessment  Discharge Assessment   [facility]   Principal Problem: Substance induced mood disorder [facility](HCC) Discharge Diagnoses:  Patient Active Problem List   Diagnosis Date Noted  . MDD (major depressive disorder), severe (HCC) [F32.2] 11/03/2017  . Depression [F32.9]   . Borderline personality disorder (HCC) [F60.3] 02/10/2016  . Substance induced mood disorder (HCC) [F19.94] 02/09/2016  . Alcohol withdrawal (HCC) [F10.239] 08/23/2015  . Tobacco use disorder [F17.200] 08/23/2015  . Cocaine use disorder, severe, dependence (HCC) [F14.20] 08/11/2015  . Alcohol use disorder, severe, dependence (HCC) [F10.20] 08/11/2015  . Cannabis use disorder, severe, dependence (HCC) [F12.20] 08/11/2015   Pt was seen and chart reviewed with treatment team and Dr Jannifer FranklinAkintayo. [facility] of suicidal ideation while being intoxicated, BAL 260 on admission, UDS benzos, THC. [facility] after a 4 day inpatient hospitalization for the same issue. [facility] and [facility]. Pt will be seen by Peer Support specialist for assistance with substance abuse resources in the community. Pt will also be referred to [facility]. Pt is stable and psychiatrically clear for discharge.    Total Time spent with patient: 45 minutes  Musculoskeletal: Strength & Muscle Tone: within normal limits Gait & Station: normal Patient leans: N/A  Psychiatric Specialty Exam:   Blood pressure (!) 90/48, pulse 86, temperature 98 F (36.7 C), temperature source Oral, resp. rate 16, height 5\' 6"  (1.676 m), weight 59 kg (130 lb), SpO2 93 %.Body mass index is 20.98 kg/m.  General Appearance: Casual  Eye Contact::  Fair  Speech:  Clear and Coherent and Normal Rate409  Volume:  Normal  Mood:  Depressed and Irritable  Affect:  Congruent  Thought Process:  Coherent and Linear   Orientation:  Full (Time, Place, and Person)  Thought Content:  Logical  Suicidal Thoughts:  No  Homicidal Thoughts:  No  Memory:  Immediate;   Good Recent;   Good Remote;   Fair  Judgement:  Poor  Insight:  Shallow  Psychomotor Activity:  Normal  Concentration:  Good  Recall:  Good  Fund of Knowledge:Good  Language: Good  Akathisia:  No  Handed:  Right  AIMS (if indicated):     Assets:  ArchitectCommunication Skills Financial Resources/Insurance Housing Physical Health  Sleep:     Cognition: WNL  ADL's:  Intact   Mental Status Per Nursing Assessment::   On Admission:     Demographic Factors:  Male, Adolescent or young adult and Caucasian  Loss Factors: Financial problems/change in socioeconomic status  Historical Factors: Impulsivity  Risk Reduction Factors:   Sense of responsibility to family and Living with another person, especially a relative  Continued Clinical Symptoms:  Depression:   Comorbid alcohol abuse/dependence Impulsivity Alcohol/Substance Abuse/Dependencies More than one psychiatric diagnosis  Cognitive Features That Contribute To Risk:  Closed-mindedness    Suicide Risk:  Minimal: No identifiable suicidal ideation.  Patients presenting with no risk factors but with morbid ruminations; may be classified as minimal risk based on the severity of the depressive symptoms    Plan Of Care/Follow-up recommendations:  Activity:  as tolerated Diet:  Heart Healthy  Laveda AbbeLaurie Britton Parks, NP 11/14/2017, 11:24 AM

## 2017-11-14 NOTE — Patient Outreach (Signed)
ED Peer Support Specialist Patient Intake (Complete at intake & 30-60 Day Follow-up)  Name: Carlos French  MRN: [id number]  Age: 26 y.o.   Date of Admission: 11/14/2017  Intake: Initial Comments:      Primary Reason Admitted: Pt reports having "suicidal thoughts for a couple weeks". Pt states "the suicidal thoughts came back approximately 3 days."  Pt reports having a "plan of hanging (himself) with a belt". Pt denies having homicidal thoughts.  Pt denies currently having auditory/visual hallucinations but admits to having A/V-H in the past.  Pt reports alcohol use and states "I drink 4- 25 oz cans of beer daily".  Pt denies any other substance use.   Pt received inpatient treatment 11/03/17 at [place] for having suicidal thoughts with a plan.  Pt reports that he followed up with [facility] and has been compliant with his medication.  Pt states if inpatient treatment is recommended he willing to voluntarily sign in.  Pt states "I can't contract for safety.  I don't need to go home like this, I need help."  Pt resides with his girlfriend, her 4 children, and her mother.  Pt states that he can return at discharge.  Pt would not elaborate on the circumstance or stressful events which led him to having suicidal thoughts.      Lab values: Alcohol/ETOH: Positive Positive UDS? No Amphetamines: No Barbiturates: No Benzodiazepines: No Cocaine: No Opiates: No Cannabinoids: No  Demographic information: Gender: Male Ethnicity: White Marital Status: Single Insurance Status: Uninsured/Self-pay Ecologist (Work Neurosurgeon, Physicist, medical, etc.: [employer] ) Lives with: Alone Living situation: House/Apartment  Reported Patient History: Patient reported health conditions: Bipolar disorder(PTSD) Patient aware of HIV and hepatitis status: No  In past year, has patient visited ED for any reason? Yes  Number of ED visits: 1  Reason(s) for visit: Strep  throat  In past year, has patient been hospitalized for any reason?    Number of hospitalizations:    Reason(s) for hospitalization:    In past year, has patient been arrested? No  Number of arrests:    Reason(s) for arrest:    In past year, has patient been incarcerated? No  Number of incarcerations:    Reason(s) for incarceration:    In past year, has patient received medication-assisted treatment? No  In past year, patient received the following treatments: Other (comment)  In past year, has patient received any harm reduction services? No  Did this include any of the following?    In past year, has patient received care from a mental health provider for diagnosis other than SUD? Yes([facility])  In past year, is this first time patient has overdosed? No  Number of past overdoses:    In past year, is this first time patient has been hospitalized for an overdose? No  Number of hospitalizations for overdose(s):    Is patient currently receiving treatment for a mental health diagnosis? No  Patient reports experiencing difficulty participating in SUD treatment: No    Most important reason(s) for this difficulty?    Has patient received prior services for treatment? No  In past, patient has received services from following agencies:    Plan of Care:  Suggested follow up at these agencies/treatment centers: Other (comment), [facility] ([facility]ical Dependency Intensive Outpatient Program)(Wants to go to [facility])  Other information: CPSS met with Pt and talked with him about how he was doing and to monitor services. CPSS discussed the importance of talking about what he  is dealing with so that he can get it off his chest and possible gain some assistance from expressing what he is dealing with. CPSS processed with Pt and gave him contact information so that he can contact CPSS when he feels that he needs someone to talk too.     Carlos French, CPSS  11/14/2017 10:27  AM

## 2017-11-15 ENCOUNTER — Encounter (HOSPITAL_COMMUNITY): Payer: Self-pay | Admitting: Registered Nurse

## 2017-11-15 DIAGNOSIS — F332 Major depressive disorder, recurrent severe without psychotic features: Secondary | ICD-10-CM

## 2017-11-15 MED ORDER — TRAZODONE HCL 100 MG PO TABS: 100.0000 mg | ORAL_TABLET | Freq: Every evening | ORAL | Status: DC | PRN

## 2017-11-15 MED ORDER — QUETIAPINE FUMARATE 25 MG PO TABS: 25.0000 mg | ORAL_TABLET | Freq: Three times a day (TID) | ORAL | Status: DC | PRN

## 2017-11-15 NOTE — ED Notes (Signed)
[facility] but RN at lunch, was told to call back when transportation arrives for pt

## 2017-11-15 NOTE — ED Notes (Signed)
Regular diet breakfast tray ordered w/ no sharps @ 0733.  

## 2017-11-15 NOTE — BHH Counselor (Signed)
Referrals faxed to following inpatient facilities:  [facility], [address], [place], [place]  Evette Cristalaroline Paige Brenee Gajda, [place] Therapeutic Triage Specialist

## 2017-11-15 NOTE — ED Provider Notes (Signed)
Patient alert, content appearing, nad. Vitals:   11/15/17 0636 11/15/17 0651  BP: 120/87 120/87  Pulse: 62 62  Resp: 16   Temp: (!) 97.5 F (36.4 C)   SpO2: 96%    Pts meds adjusted to reflect current home med use.  Awaiting BH team re-evaluation and placement.      Cathren LaineSteinl, Shanese Riemenschneider, MD 11/15/17 [phone]

## 2017-11-15 NOTE — BHH Counselor (Addendum)
1326 Pt has been accepted by Dr Armandina Stammerigardy Munov. # for report is [phone]. They will hold bed for 24 hrs but pt's bed is ready now, per [facility] [place]. Writer faxed IVC to [facility] at [phone].  1322 TC from [facility] - they are reviewing pt's referral and request IVC paperwork. Writer spoke w/ Ladona Ridgelaylor [facility] at [facility] who [facility] at [phone].  Evette Cristalaroline Paige Casilda Pickerill, [place] Therapeutic Triage Specialist

## 2017-11-15 NOTE — ED Provider Notes (Signed)
BH team indicates [facility], Dr Acey Lav [facility].    Pt alert, content, nad.   Vitals:   11/15/17 0651 11/15/17 1215  BP: 120/87 (!) 127/93  Pulse: 62 75  Resp:  17  Temp:  (!) 97.5 F (36.4 C)  SpO2:  98%   Pt currently appears stable for transfer.     Cathren LaineSteinl, Meldon Hanzlik, MD 11/15/17 1352

## 2017-11-15 NOTE — ED Notes (Signed)
Pt used 1 phone call to call girlfriend to inform her of his [facility].

## 2017-11-15 NOTE — Consult Note (Signed)
Telepsych Consultation   Reason for Consult:  Suicidal ideation Referring Physician:  Margette Fast, MD Location of Patient: [facility] Location of Provider: [facility]  Patient Identification: Carlos French MRN:  [id number] Principal Diagnosis: MDD (major depressive disorder), recurrent, severe, with psychosis ([place]) Diagnosis:   Patient Active Problem List   Diagnosis Date Noted  . MDD (major depressive disorder), recurrent, severe, with psychosis ([place]) [F33.3] 11/15/2017  . MDD (major depressive disorder), severe ([place]) [F32.2] 11/03/2017  . Depression [F32.9]   . Borderline personality disorder ([place]) [F60.3] 02/10/2016  . Substance induced mood disorder ([place]) [F19.94] 02/09/2016  . Alcohol withdrawal ([place]) [F10.239] 08/23/2015  . Tobacco use disorder [F17.200] 08/23/2015  . Cocaine use disorder, severe, dependence ([place]) [F14.20] 08/11/2015  . Alcohol use disorder, severe, dependence ([place]) [F10.20] 08/11/2015  . Cannabis use disorder, severe, dependence ([place]) [F12.20] 08/11/2015    Total Time spent with patient: 45 minutes  Subjective:   Carlos French is a 26 y.o. male patient presented to [facility] with complaints of suicidal ideation and auditory hallucinations.     HPI:  Carlos French, 26 y.o., male patient seen via telepsych by this provider on 11/15/26.  Chart reviewed and consulted with Dr. Dwyane Dee.  Patient was discharged from [facility] yesterday and left [facility] walked across street to [place] after leaving [facility] patient then went to [facility].  Patient was given resources and follow information prior to discharge from [facility] on evaluation Carlos French reports he was recently discharged from [facility] and was to follow up with [facility].  Stats that he has been compliant with his medications but they are not working.  Reports that his depression continues to worsening and that he is hearing voices telling him that he is worthless; that he needs to die; and that no one cares about  him.   IVC was taking out on patient by his family after he threaten to hang himself.  Patient states that he doesn't know what his stressors are.  States that he lives with his girlfriend, and is unemployed at this current time related to "so many hospital admissions."  During assessment patient calm/cooperative with depressed flat affect.  He is oriented x 4 but does not appear to be responding to internal/external stimuli.  All questions were answered appropriately.  Patient denied homicidal ideation, delusions, and paranoia. Patient continues to endorse suicidal ideation and [place] unable to contract for safety    Past Psychiatric History: MDD and ETOH  Risk to Self: Suicidal Ideation: Yes-Currently Present Suicidal Intent: Yes-Currently Present Is patient at risk for suicide?: Yes Suicidal Plan?: Yes-Currently Present Specify Current Suicidal Plan: Hanging self with a belt Access to Means: Yes Specify Access to Suicidal Means: Belts, rope, etc What has been your use of drugs/alcohol within the last 12 months?: ETOH How many times?: 3 Other Self Harm Risks: None currently Triggers for Past Attempts: Other (Comment)(Family issues, drug use) Intentional Self Injurious Behavior: Burning Comment - Self Injurious Behavior: Past hx of burning Risk to Others: Homicidal Ideation: No Thoughts of Harm to Others: No Comment - Thoughts of Harm to Others: None Current Homicidal Intent: No Current Homicidal Plan: No Describe Current Homicidal Plan: None Access to Homicidal Means: No Identified Victim: Denies History of harm to others?: Yes Assessment of Violence: In distant past Violent Behavior Description: Some fights with brother and father Does patient have access to weapons?: No Criminal Charges Pending?: No Does patient have a court date: No Prior Inpatient Therapy:  Prior Inpatient Therapy: Yes Prior Therapy Dates: 11/03/17; 01/2016; tx in 07/2015 Prior Therapy Facilty/Provider(s):  [facility] Reason for Treatment: Suicidal thoughts and depression Prior Outpatient Therapy: Prior Outpatient Therapy: Yes Prior Therapy Dates: Unknown Prior Therapy Facilty/Provider(s): [facility] Reason for Treatment: med management Does patient have an ACCT team?: No Does patient have Intensive In-House Services?  : No Does patient have [facility] services? : Yes Does patient have P4CC services?: No  Past Medical History:  Past Medical History:  Diagnosis Date  . Allergy   . Anxiety   . Bipolar 1 disorder ([place])   . Depression   . GERD (gastroesophageal reflux disease)   . GI (gastrointestinal bleed)   . GI bleeding   . PTSD (post-traumatic stress disorder)   . Suicidal ideation    History reviewed. No pertinent surgical history. Family History:  Family History  Problem Relation Age of Onset  . Stroke Mother    Family Psychiatric  History: Denies Social History:  Social History   Substance and Sexual Activity  Alcohol Use Yes   Comment: Daily. Reports he drinks 3-4 25oz beers a day.  Last drink: 1 hour ago.      Social History   Substance and Sexual Activity  Drug Use No   Comment: Has a history. States, "I don't do drugs anymore".     Social History   Socioeconomic History  . Marital status: Single    Spouse name: None  . Number of children: None  . Years of education: None  . Highest education level: None  Social Needs  . Financial resource strain: None  . Food insecurity - worry: None  . Food insecurity - inability: None  . Transportation needs - medical: None  . Transportation needs - non-medical: None  Occupational History  . None  Tobacco Use  . Smoking status: Current Every Day Smoker    Packs/day: 0.00    Types: Cigarettes  . Smokeless tobacco: Former Network engineer and Sexual Activity  . Alcohol use: Yes    Comment: Daily. Reports he drinks 3-4 25oz beers a day.  Last drink: 1 hour ago.   . Drug use: No    Comment: Has a history. States, "I don't do  drugs anymore".   . Sexual activity: No  Other Topics Concern  . None  Social History Narrative  . None   Additional Social History:    Allergies:   Allergies  Allergen Reactions  . Acetaminophen Other (See Comments)    CANNOT TAKE (causes stomach bleeding)  . Ibuprofen     History of GI bleed    Labs:  Results for orders placed or performed during the hospital encounter of 11/14/17 (from the past 48 hour(s))  Comprehensive metabolic panel     Status: Abnormal   Collection Time: 11/14/17  7:43 PM  Result Value Ref Range   Sodium 135 135 - 145 mmol/L   Potassium 3.7 3.5 - 5.1 mmol/L   Chloride 101 101 - 111 mmol/L   CO2 24 22 - 32 mmol/L   Glucose, Bld 86 65 - 99 mg/dL   BUN 10 6 - 20 mg/dL   Creatinine, Ser 0.92 0.61 - 1.24 mg/dL   Calcium 8.7 (L) 8.9 - 10.3 mg/dL   Total Protein 7.2 6.5 - 8.1 g/dL   Albumin 3.9 3.5 - 5.0 g/dL   AST 23 15 - 41 U/L   ALT 31 17 - 63 U/L   Alkaline Phosphatase 71 38 - 126 U/L  Total Bilirubin 0.6 0.3 - 1.2 mg/dL   GFR calc non Af Amer >60 >60 mL/min   GFR calc Af Amer >60 >60 mL/min    Comment: (NOTE) The eGFR has been calculated using the CKD EPI equation. This calculation has not been validated in all clinical situations. eGFR's persistently <60 mL/min signify possible Chronic Kidney Disease.    Anion gap 10 5 - 15  Ethanol     Status: Abnormal   Collection Time: 11/14/17  7:43 PM  Result Value Ref Range   Alcohol, Ethyl (B) 18 (H) <10 mg/dL    Comment:        LOWEST DETECTABLE LIMIT FOR SERUM ALCOHOL IS 10 mg/dL FOR MEDICAL PURPOSES ONLY   Salicylate level     Status: None   Collection Time: 11/14/17  7:43 PM  Result Value Ref Range   Salicylate Lvl <3.3 2.8 - 30.0 mg/dL  Acetaminophen level     Status: Abnormal   Collection Time: 11/14/17  7:43 PM  Result Value Ref Range   Acetaminophen (Tylenol), Serum <10 (L) 10 - 30 ug/mL    Comment:        THERAPEUTIC CONCENTRATIONS VARY SIGNIFICANTLY. A RANGE OF 10-30 ug/mL  MAY BE AN EFFECTIVE CONCENTRATION FOR MANY PATIENTS. HOWEVER, SOME ARE BEST TREATED AT CONCENTRATIONS OUTSIDE THIS RANGE. ACETAMINOPHEN CONCENTRATIONS >150 ug/mL AT 4 HOURS AFTER INGESTION AND >50 ug/mL AT 12 HOURS AFTER INGESTION ARE OFTEN ASSOCIATED WITH TOXIC REACTIONS.   cbc     Status: None   Collection Time: 11/14/17  7:43 PM  Result Value Ref Range   WBC 5.6 4.0 - 10.5 K/uL   RBC 4.69 4.22 - 5.81 MIL/uL   Hemoglobin 15.2 13.0 - 17.0 g/dL   HCT 43.7 39.0 - 52.0 %   MCV 93.2 78.0 - 100.0 fL   MCH 32.4 26.0 - 34.0 pg   MCHC 34.8 30.0 - 36.0 g/dL   RDW 13.2 11.5 - 15.5 %   Platelets 200 150 - 400 K/uL  Rapid urine drug screen (hospital performed)     Status: Abnormal   Collection Time: 11/14/17  7:43 PM  Result Value Ref Range   Opiates NONE DETECTED NONE DETECTED   Cocaine NONE DETECTED NONE DETECTED   Benzodiazepines POSITIVE (A) NONE DETECTED   Amphetamines NONE DETECTED NONE DETECTED   Tetrahydrocannabinol POSITIVE (A) NONE DETECTED   Barbiturates NONE DETECTED NONE DETECTED    Comment:        DRUG SCREEN FOR MEDICAL PURPOSES ONLY.  IF CONFIRMATION IS NEEDED FOR ANY PURPOSE, NOTIFY LAB WITHIN 5 DAYS.        LOWEST DETECTABLE LIMITS FOR URINE DRUG SCREEN Drug Class       Cutoff (ng/mL) Amphetamine      1000 Barbiturate      200 Benzodiazepine   435 Tricyclics       686 Opiates          300 Cocaine          300 THC              50     Medications:  Current Facility-Administered Medications  Medication Dose Route Frequency Provider Last Rate Last Dose  . alum & mag hydroxide-simeth (MAALOX/MYLANTA) 200-200-20 MG/5ML suspension 30 mL  30 mL Oral Q6H PRN Long, Wonda Olds, MD      . hydrOXYzine (ATARAX/VISTARIL) tablet 25 mg  25 mg Oral TID PRN Long, Wonda Olds, MD      . LORazepam (ATIVAN) injection  0-4 mg  0-4 mg Intravenous Q6H Long, Wonda Olds, MD       Or  . LORazepam (ATIVAN) tablet 0-4 mg  0-4 mg Oral Q6H Long, Wonda Olds, MD   1 mg at 11/15/17 1043  .  [START ON 11/17/2017] LORazepam (ATIVAN) injection 0-4 mg  0-4 mg Intravenous Q12H Long, Wonda Olds, MD       Or  . Derrill Memo ON 11/17/2017] LORazepam (ATIVAN) tablet 0-4 mg  0-4 mg Oral Q12H Long, Wonda Olds, MD      . mirtazapine (REMERON) tablet 15 mg  15 mg Oral QHS Margette Fast, MD   15 mg at 11/14/17 2333  . nicotine (NICODERM CQ - dosed in mg/24 hours) patch 21 mg  21 mg Transdermal Daily Long, Wonda Olds, MD   21 mg at 11/15/17 1045  . ondansetron (ZOFRAN) tablet 4 mg  4 mg Oral Q8H PRN Long, Wonda Olds, MD      . pantoprazole (PROTONIX) EC tablet 20 mg  20 mg Oral Daily Long, Wonda Olds, MD   20 mg at 11/15/17 1043  . QUEtiapine (SEROQUEL) tablet 200 mg  200 mg Oral QHS Long, Wonda Olds, MD   200 mg at 11/14/17 2332  . QUEtiapine (SEROQUEL) tablet 25 mg  25 mg Oral TID PRN Lajean Saver, MD      . thiamine (VITAMIN B-1) tablet 100 mg  100 mg Oral Daily Long, Wonda Olds, MD   100 mg at 11/15/17 1043  . traZODone (DESYREL) tablet 100 mg  100 mg Oral QHS PRN Lajean Saver, MD       Current Outpatient Medications  Medication Sig Dispense Refill  . hydrOXYzine (ATARAX/VISTARIL) 25 MG tablet Take 1 tablet (25 mg total) 3 (three) times daily as needed by mouth for anxiety. 30 tablet 0  . mirtazapine (REMERON) 15 MG tablet Take 1 tablet (15 mg total) at bedtime by mouth. For mood control 30 tablet 0  . pantoprazole (PROTONIX) 20 MG tablet Take 1 tablet (20 mg total) daily by mouth. For acid reflux 30 tablet 0  . prazosin (MINIPRESS) 1 MG capsule Take 1 mg by mouth at bedtime.    Marland Kitchen QUEtiapine (SEROQUEL) 100 MG tablet Take 1 tablet (100 mg total) at bedtime by mouth. For mood control (Patient taking differently: Take 200 mg by mouth at bedtime. For mood control) 30 tablet 0  . QUEtiapine (SEROQUEL) 25 MG tablet Take 1 tablet (25 mg total) 2 (two) times daily as needed by mouth (Agitation). (Patient taking differently: Take 25 mg by mouth 3 (three) times daily as needed (Agitation). ) 60 tablet 0  . traZODone  (DESYREL) 50 MG tablet Take 1 tablet (50 mg total) at bedtime as needed by mouth for sleep. (Patient taking differently: Take 100 mg by mouth at bedtime as needed for sleep. ) 30 tablet 0  . gabapentin (NEURONTIN) 100 MG capsule Take 1 capsule (100 mg total) 3 (three) times daily by mouth. For withdrawal (Patient not taking: Reported on 11/14/2017) 90 capsule 0    Musculoskeletal: Strength & Muscle Tone: within normal limits Gait & Station: normal Patient leans: N/A  Psychiatric Specialty Exam: Physical Exam  ROS  Blood pressure (!) 127/93, pulse 75, temperature (!) 97.5 F (36.4 C), temperature source Oral, resp. rate 17, SpO2 98 %.There is no height or weight on file to calculate BMI.  General Appearance: Casual  Eye Contact:  Good  Speech:  Clear and Coherent and Normal Rate  Volume:  Normal  Mood:  Depressed  Affect:  Depressed and Flat  Thought Process:  Goal Directed  Orientation:  Full (Time, Place, and Person)  Thought Content:  Hallucinations: Auditory  Suicidal Thoughts:  Yes.  with intent/plan  Homicidal Thoughts:  No  Memory:  Immediate;   Good Recent;   Good Remote;   Good  Judgement:  Fair  Insight:  Shallow  Psychomotor Activity:  Normal  Concentration:  Concentration: Good and Attention Span: Good  Recall:  Good  Fund of Knowledge:  Fair  Language:  Good  Akathisia:  No  Handed:  Right  AIMS (if indicated):     Assets:  Communication Skills Desire for Improvement Housing Social Support  ADL's:  Intact  Cognition:  WNL  Sleep:        Treatment Plan Summary: Daily contact with patient to assess and evaluate symptoms and progress in treatment, Medication management and Plan Inpatient psychiatric treatment admission  Disposition: Recommend psychiatric Inpatient admission when medically cleared.  This service was provided via telemedicine using a 2-way, interactive audio and video technology.  Names of all persons participating in this telemedicine  service and their role in this encounter. Name: Earleen Newport NP Role: Psychiatrist  Name: Dr Dwyane Dee Role: Psychiatrist  Name: Wilfrid Lund Role: Patient   Name:  Role:     Earleen Newport, NP 11/15/2017 12:28 PM

## 2017-11-15 NOTE — ED Notes (Signed)
[employer] called back and stated the pt will be transported between 4-430pm today

## 2017-11-15 NOTE — ED Notes (Signed)
Rn called [employer] number for transport, no answer, voicemail left for [employer].

## 2017-11-15 NOTE — ED Notes (Signed)
Lunch Tray Ordered 

## 2017-11-15 NOTE — ED Notes (Signed)
TTS in progress 

## 2017-11-15 NOTE — Discharge Instructions (Signed)
Transfer to [place] °

## 2017-11-15 NOTE — ED Provider Notes (Signed)
Patient re-assessed pre transfer.    Patient remains alert, content, denies any new c/o.  Patient continues to appears stable for transfer.  Vitals:   11/15/17 1553 11/15/17 1553  BP: 121/84 121/84  Pulse: 83 83  Resp:  18  Temp:    SpO2:  98%      Cathren LaineSteinl, Jorge Amparo, MD 11/15/17 1625

## 2018-05-01 ENCOUNTER — Encounter (HOSPITAL_COMMUNITY): Payer: Self-pay

## 2018-05-01 ENCOUNTER — Emergency Department (HOSPITAL_COMMUNITY)
Admission: EM | Admit: 2018-05-01 | Discharge: 2018-05-01 | Disposition: A | Discharge status: Home / Self Care | Payer: Medicaid Other | Attending: Emergency Medicine | Admitting: Emergency Medicine

## 2018-05-01 DIAGNOSIS — Z5321 Procedure and treatment not carried out due to patient leaving prior to being seen by health care provider: Secondary | ICD-10-CM | POA: Insufficient documentation

## 2018-05-01 DIAGNOSIS — F419 Anxiety disorder, unspecified: Secondary | ICD-10-CM | POA: Insufficient documentation

## 2018-05-01 LAB — CBC WITH DIFFERENTIAL/PLATELET
Basophils Absolute: 0 10*3/uL (ref 0.0–0.1)
Basophils Relative: 0 %
Eosinophils Absolute: 0.1 10*3/uL (ref 0.0–0.7)
Eosinophils Relative: 1 %
HCT: 48.5 % (ref 39.0–52.0)
Hemoglobin: 17.4 g/dL — ABNORMAL HIGH (ref 13.0–17.0)
Lymphocytes Relative: 14 %
Lymphs Abs: 1.4 10*3/uL (ref 0.7–4.0)
MCH: 33 pg (ref 26.0–34.0)
MCHC: 35.9 g/dL (ref 30.0–36.0)
MCV: 91.9 fL (ref 78.0–100.0)
Monocytes Absolute: 1 10*3/uL (ref 0.1–1.0)
Monocytes Relative: 10 %
Neutro Abs: 7.2 10*3/uL (ref 1.7–7.7)
Neutrophils Relative %: 75 %
Platelets: 286 10*3/uL (ref 150–400)
RBC: 5.28 MIL/uL (ref 4.22–5.81)
RDW: 12.7 % (ref 11.5–15.5)
WBC: 9.7 10*3/uL (ref 4.0–10.5)

## 2018-05-01 LAB — BASIC METABOLIC PANEL
Anion gap: 17 — ABNORMAL HIGH (ref 5–15)
BUN: 5 mg/dL — ABNORMAL LOW (ref 6–20)
CO2: 20 mmol/L — ABNORMAL LOW (ref 22–32)
Calcium: 9.3 mg/dL (ref 8.9–10.3)
Chloride: 93 mmol/L — ABNORMAL LOW (ref 101–111)
Creatinine, Ser: 0.96 mg/dL (ref 0.61–1.24)
GFR calc Af Amer: >60 mL/min (ref >60)
GFR calc non Af Amer: >60 mL/min (ref >60)
Glucose, Bld: 79 mg/dL (ref 65–99)
Potassium: 3.8 mmol/L (ref 3.5–5.1)
Sodium: 130 mmol/L — ABNORMAL LOW (ref 135–145)

## 2018-05-01 LAB — RAPID URINE DRUG SCREEN, HOSP PERFORMED
Amphetamines: NOT DETECTED
Barbiturates: NOT DETECTED
Benzodiazepines: NOT DETECTED
Cocaine: NOT DETECTED
Opiates: NOT DETECTED
Tetrahydrocannabinol: POSITIVE — AB

## 2018-05-01 LAB — ETHANOL: Alcohol, Ethyl (B): <10 mg/dL (ref <10)

## 2018-05-01 NOTE — ED Notes (Signed)
Called Patient to reassess vitals x3 and had no response. 

## 2018-05-01 NOTE — ED Triage Notes (Signed)
Patient arrived by [facility] for increased anxiety. States that he has been off his bipolar meds x 3 days. denies suicidal ideation. Alert and oriented, NAD

## 2018-05-31 ENCOUNTER — Emergency Department (HOSPITAL_COMMUNITY): Payer: Self-pay

## 2018-05-31 ENCOUNTER — Encounter (HOSPITAL_COMMUNITY): Payer: Self-pay | Admitting: *Deleted

## 2018-05-31 ENCOUNTER — Emergency Department (HOSPITAL_COMMUNITY)
Admission: EM | Admit: 2018-05-31 | Discharge: 2018-05-31 | Disposition: A | Discharge status: Home / Self Care | Payer: Self-pay | Attending: Emergency Medicine | Admitting: Emergency Medicine

## 2018-05-31 DIAGNOSIS — R0781 Pleurodynia: Secondary | ICD-10-CM | POA: Insufficient documentation

## 2018-05-31 DIAGNOSIS — J45909 Unspecified asthma, uncomplicated: Secondary | ICD-10-CM | POA: Insufficient documentation

## 2018-05-31 DIAGNOSIS — F1210 Cannabis abuse, uncomplicated: Secondary | ICD-10-CM | POA: Insufficient documentation

## 2018-05-31 DIAGNOSIS — F17210 Nicotine dependence, cigarettes, uncomplicated: Secondary | ICD-10-CM | POA: Insufficient documentation

## 2018-05-31 DIAGNOSIS — R059 Cough, unspecified: Secondary | ICD-10-CM

## 2018-05-31 DIAGNOSIS — F1410 Cocaine abuse, uncomplicated: Secondary | ICD-10-CM | POA: Insufficient documentation

## 2018-05-31 DIAGNOSIS — R05 Cough: Secondary | ICD-10-CM | POA: Insufficient documentation

## 2018-05-31 DIAGNOSIS — Z79899 Other long term (current) drug therapy: Secondary | ICD-10-CM | POA: Insufficient documentation

## 2018-05-31 MED ORDER — ALBUTEROL SULFATE HFA 108 (90 BASE) MCG/ACT IN AERS
2.0000 | INHALATION_SPRAY | RESPIRATORY_TRACT | Status: DC | PRN
  Administered 2018-05-31: 2 via RESPIRATORY_TRACT
  Filled 2018-05-31: qty 6.7

## 2018-05-31 MED ORDER — IPRATROPIUM-ALBUTEROL 0.5-2.5 (3) MG/3ML IN SOLN
3.0000 mL | Freq: Once | RESPIRATORY_TRACT | Status: AC
  Administered 2018-05-31: 3 mL via RESPIRATORY_TRACT
  Filled 2018-05-31: qty 3

## 2018-05-31 MED ORDER — ALBUTEROL SULFATE HFA 108 (90 BASE) MCG/ACT IN AERS: 2.0000 | INHALATION_SPRAY | RESPIRATORY_TRACT | 0 refills | Status: DC | PRN

## 2018-05-31 MED ORDER — OPTICHAMBER DIAMOND MISC
Freq: Once | Status: AC
  Administered 2018-05-31: 12:00:00
  Filled 2018-05-31: qty 1

## 2018-05-31 MED ORDER — OMEPRAZOLE 20 MG PO CPDR: 20.0000 mg | DELAYED_RELEASE_CAPSULE | Freq: Every day | ORAL | 0 refills | Status: DC

## 2018-05-31 NOTE — ED Notes (Signed)
Pt verbalized understanding of d.c instructions and follow up care, no further questions. Pt a.o, ambulatory upon d.c

## 2018-05-31 NOTE — Discharge Instructions (Signed)
I have given you Prescriptions for Prilosec and an inhaler.  You may obtain Prilosec over-the-counter which will most likely be less expensive.  You have been given an inhaler today, please make sure that you keep this with you.  You have a prescription for a second inhaler if needed.  Please use either ice or a heating pad to see if that will help your pain.  You may also use a lidocaine patch or icy hot to see if these help, however make sure that you do not use these at the same time as either ice or heat as these can cause burns.  Please start taking Prilosec daily.  If your pain worsens, you develop shortness of breath, or have additional concerns or would like further work-up then please seek additional medical care and evaluation.  I have given you some information to read about stopping smoking.

## 2018-05-31 NOTE — ED Triage Notes (Signed)
Pt in c/o pain to his "left lung" when he is breathing, also reports cough, states he inhaled a bunch of sulfur a few days at work and pain and cough started after that
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
## 2018-09-15 ENCOUNTER — Inpatient Hospital Stay (HOSPITAL_COMMUNITY)
Admission: AD | Admit: 2018-09-15 | Discharge: 2018-09-19 | DRG: 885 | Disposition: A | Discharge status: Home / Self Care | Payer: Federal, State, Local not specified - Other | Source: Intra-hospital | Attending: Psychiatry | Admitting: Psychiatry

## 2018-09-15 ENCOUNTER — Encounter (HOSPITAL_COMMUNITY): Payer: Self-pay | Admitting: Emergency Medicine

## 2018-09-15 ENCOUNTER — Other Ambulatory Visit: Payer: Self-pay

## 2018-09-15 ENCOUNTER — Emergency Department (HOSPITAL_COMMUNITY)
Admission: EM | Admit: 2018-09-15 | Discharge: 2018-09-15 | Disposition: A | Discharge status: Other Acute Inpatient Hospital | Payer: Medicaid Other | Attending: Emergency Medicine | Admitting: Emergency Medicine

## 2018-09-15 DIAGNOSIS — F603 Borderline personality disorder: Secondary | ICD-10-CM | POA: Diagnosis present

## 2018-09-15 DIAGNOSIS — Z886 Allergy status to analgesic agent status: Secondary | ICD-10-CM | POA: Diagnosis not present

## 2018-09-15 DIAGNOSIS — F10239 Alcohol dependence with withdrawal, unspecified: Secondary | ICD-10-CM | POA: Diagnosis not present

## 2018-09-15 DIAGNOSIS — F1220 Cannabis dependence, uncomplicated: Secondary | ICD-10-CM | POA: Diagnosis present

## 2018-09-15 DIAGNOSIS — F329 Major depressive disorder, single episode, unspecified: Secondary | ICD-10-CM | POA: Insufficient documentation

## 2018-09-15 DIAGNOSIS — F4310 Post-traumatic stress disorder, unspecified: Secondary | ICD-10-CM

## 2018-09-15 DIAGNOSIS — Z915 Personal history of self-harm: Secondary | ICD-10-CM

## 2018-09-15 DIAGNOSIS — Z91410 Personal history of adult physical and sexual abuse: Secondary | ICD-10-CM

## 2018-09-15 DIAGNOSIS — Z046 Encounter for general psychiatric examination, requested by authority: Secondary | ICD-10-CM | POA: Insufficient documentation

## 2018-09-15 DIAGNOSIS — F1020 Alcohol dependence, uncomplicated: Secondary | ICD-10-CM | POA: Diagnosis not present

## 2018-09-15 DIAGNOSIS — F332 Major depressive disorder, recurrent severe without psychotic features: Principal | ICD-10-CM | POA: Diagnosis present

## 2018-09-15 DIAGNOSIS — F419 Anxiety disorder, unspecified: Secondary | ICD-10-CM | POA: Diagnosis not present

## 2018-09-15 DIAGNOSIS — K219 Gastro-esophageal reflux disease without esophagitis: Secondary | ICD-10-CM | POA: Diagnosis present

## 2018-09-15 DIAGNOSIS — Z23 Encounter for immunization: Secondary | ICD-10-CM

## 2018-09-15 DIAGNOSIS — G4700 Insomnia, unspecified: Secondary | ICD-10-CM | POA: Diagnosis present

## 2018-09-15 DIAGNOSIS — F17210 Nicotine dependence, cigarettes, uncomplicated: Secondary | ICD-10-CM | POA: Diagnosis not present

## 2018-09-15 DIAGNOSIS — F1010 Alcohol abuse, uncomplicated: Secondary | ICD-10-CM | POA: Insufficient documentation

## 2018-09-15 DIAGNOSIS — R45851 Suicidal ideations: Secondary | ICD-10-CM | POA: Insufficient documentation

## 2018-09-15 DIAGNOSIS — F1420 Cocaine dependence, uncomplicated: Secondary | ICD-10-CM | POA: Diagnosis present

## 2018-09-15 DIAGNOSIS — Y907 Blood alcohol level of 200-239 mg/100 ml: Secondary | ICD-10-CM | POA: Insufficient documentation

## 2018-09-15 DIAGNOSIS — Z9114 Patient's other noncompliance with medication regimen: Secondary | ICD-10-CM | POA: Diagnosis not present

## 2018-09-15 DIAGNOSIS — Z79899 Other long term (current) drug therapy: Secondary | ICD-10-CM | POA: Insufficient documentation

## 2018-09-15 DIAGNOSIS — F17213 Nicotine dependence, cigarettes, with withdrawal: Secondary | ICD-10-CM | POA: Diagnosis not present

## 2018-09-15 DIAGNOSIS — R451 Restlessness and agitation: Secondary | ICD-10-CM | POA: Insufficient documentation

## 2018-09-15 DIAGNOSIS — Z818 Family history of other mental and behavioral disorders: Secondary | ICD-10-CM | POA: Diagnosis not present

## 2018-09-15 LAB — RAPID URINE DRUG SCREEN, HOSP PERFORMED
Amphetamines: NOT DETECTED
BARBITURATES: NOT DETECTED
BENZODIAZEPINES: NOT DETECTED
Cocaine: NOT DETECTED
Opiates: NOT DETECTED
Tetrahydrocannabinol: POSITIVE — AB

## 2018-09-15 LAB — COMPREHENSIVE METABOLIC PANEL
ALT: 191 U/L — ABNORMAL HIGH (ref 0–44)
AST: 181 U/L — AB (ref 15–41)
Albumin: 4.3 g/dL (ref 3.5–5.0)
Alkaline Phosphatase: 56 U/L (ref 38–126)
Anion gap: 17 — ABNORMAL HIGH (ref 5–15)
CO2: 24 mmol/L (ref 22–32)
CREATININE: 0.8 mg/dL (ref 0.61–1.24)
Calcium: 8.9 mg/dL (ref 8.9–10.3)
Chloride: 85 mmol/L — ABNORMAL LOW (ref 98–111)
GFR calc Af Amer: >60 mL/min (ref >60)
GLUCOSE: 136 mg/dL — AB (ref 70–99)
Potassium: 3.3 mmol/L — ABNORMAL LOW (ref 3.5–5.1)
Sodium: 126 mmol/L — ABNORMAL LOW (ref 135–145)
TOTAL PROTEIN: 7.3 g/dL (ref 6.5–8.1)
Total Bilirubin: 0.7 mg/dL (ref 0.3–1.2)

## 2018-09-15 LAB — ACETAMINOPHEN LEVEL: Acetaminophen (Tylenol), Serum: <10 ug/mL — ABNORMAL LOW (ref 10–30)

## 2018-09-15 LAB — CBC
HEMATOCRIT: 43.4 % (ref 39.0–52.0)
Hemoglobin: 15.2 g/dL (ref 13.0–17.0)
MCH: 32.2 pg (ref 26.0–34.0)
MCHC: 35 g/dL (ref 30.0–36.0)
MCV: 91.9 fL (ref 78.0–100.0)
PLATELETS: 180 10*3/uL (ref 150–400)
RBC: 4.72 MIL/uL (ref 4.22–5.81)
RDW: 12.3 % (ref 11.5–15.5)
WBC: 4.6 10*3/uL (ref 4.0–10.5)

## 2018-09-15 LAB — SALICYLATE LEVEL: Salicylate Lvl: <7 mg/dL (ref 2.8–30.0)

## 2018-09-15 LAB — ETHANOL: ALCOHOL ETHYL (B): 224 mg/dL — AB (ref <10)

## 2018-09-15 MED ORDER — INFLUENZA VAC SPLIT QUAD 0.5 ML IM SUSY
0.5000 mL | PREFILLED_SYRINGE | INTRAMUSCULAR | Status: AC
  Administered 2018-09-17: 0.5 mL via INTRAMUSCULAR
  Filled 2018-09-15: qty 0.5

## 2018-09-15 MED ORDER — LORAZEPAM 1 MG PO TABS
0.0000 mg | ORAL_TABLET | Freq: Two times a day (BID) | ORAL | Status: DC
Start: 2018-09-18 — End: 2018-09-15

## 2018-09-15 MED ORDER — LORAZEPAM 2 MG/ML IJ SOLN
0.0000 mg | Freq: Four times a day (QID) | INTRAMUSCULAR | Status: DC
  Administered 2018-09-15: 1 mg via INTRAVENOUS
  Filled 2018-09-15: qty 1

## 2018-09-15 MED ORDER — VITAMIN B-1 100 MG PO TABS: 100.0000 mg | ORAL_TABLET | Freq: Every day | ORAL | Status: DC

## 2018-09-15 MED ORDER — LORAZEPAM 2 MG/ML IJ SOLN: 0.0000 mg | Freq: Two times a day (BID) | INTRAMUSCULAR | Status: DC

## 2018-09-15 MED ORDER — SODIUM CHLORIDE 0.9 % IV BOLUS
1000.0000 mL | Freq: Once | INTRAVENOUS | Status: AC
  Administered 2018-09-15: 1000 mL via INTRAVENOUS

## 2018-09-15 MED ORDER — THIAMINE HCL 100 MG/ML IJ SOLN
100.0000 mg | Freq: Every day | INTRAMUSCULAR | Status: DC
  Administered 2018-09-15: 100 mg via INTRAVENOUS
  Filled 2018-09-15: qty 2

## 2018-09-15 MED ORDER — LORAZEPAM 1 MG PO TABS: 0.0000 mg | ORAL_TABLET | Freq: Four times a day (QID) | ORAL | Status: DC

## 2018-09-15 NOTE — ED Notes (Addendum)
Pt changed into paper scrubs, belongings bagged and taken to car by Rolly SalterHaley his girlfriend, security called to wand pt, and staffing notified of BH sitter need.  Girlfriend states if pt gets discharged she will bring clothes back.

## 2018-09-15 NOTE — ED Provider Notes (Signed)
[facility] Provider Note   CSN: [id number] Arrival date & time: 09/15/18  1610     History   Chief Complaint Chief Complaint  Patient presents with  . Alcohol Problem  . Suicidal ideation    HPI Carlos French is a 27 y.o. male who presents to the ER with cc of alcohol dependence and depression. The patient states that he is dependent on alcohol and that when he stops drinking he gets seizures.  He also gets the shakes.  Patient is feeling agitated currently.  He has had some passive suicidal ideation recently and has been feeling more depressed.  Last year he did have a suicide attempt.  Patient states that he have been having increasing thoughts about suicide.  Patient drinks at least a 12 pack of beer daily.  Current C waw score is 4.  HPI  Past Medical History:  Diagnosis Date  . Allergy   . Anxiety   . Bipolar 1 disorder (HCC)   . Depression   . GERD (gastroesophageal reflux disease)   . GI (gastrointestinal bleed)   . GI bleeding   . PTSD (post-traumatic stress disorder)   . Suicidal ideation     Patient Active Problem List   Diagnosis Date Noted  . MDD (major depressive disorder), recurrent, severe, with psychosis (HCC) 11/15/2017  . MDD (major depressive disorder), severe (HCC) 11/03/2017  . Depression   . Borderline personality disorder (HCC) 02/10/2016  . Substance induced mood disorder (HCC) 02/09/2016  . Alcohol withdrawal (HCC) 08/23/2015  . Tobacco use disorder 08/23/2015  . Cocaine use disorder, severe, dependence (HCC) 08/11/2015  . Alcohol use disorder, severe, dependence (HCC) 08/11/2015  . Cannabis use disorder, severe, dependence (HCC) 08/11/2015    History reviewed. No pertinent surgical history.      Home Medications    Prior to Admission medications   Medication Sig Start Date End Date Taking? Authorizing Provider  albuterol (PROVENTIL HFA;VENTOLIN HFA) 108 (90 Base) MCG/ACT inhaler Inhale 2 puffs  into the lungs every 4 (four) hours as needed for wheezing or shortness of breath. 05/31/18   Cristina GongHammond, Elizabeth W, PA-C  gabapentin (NEURONTIN) 100 MG capsule Take 1 capsule (100 mg total) 3 (three) times daily by mouth. For withdrawal Patient not taking: Reported on 11/14/2017 11/07/17   Money, Gerlene Burdockravis B, FNP  hydrOXYzine (ATARAX/VISTARIL) 25 MG tablet Take 1 tablet (25 mg total) 3 (three) times daily as needed by mouth for anxiety. 11/07/17   Money, Gerlene Burdockravis B, FNP  mirtazapine (REMERON) 15 MG tablet Take 1 tablet (15 mg total) at bedtime by mouth. For mood control 11/07/17   Money, Gerlene Burdockravis B, FNP  omeprazole (PRILOSEC) 20 MG capsule Take 1 capsule (20 mg total) by mouth daily. 05/31/18   Cristina GongHammond, Elizabeth W, PA-C  pantoprazole (PROTONIX) 20 MG tablet Take 1 tablet (20 mg total) daily by mouth. For acid reflux 11/08/17   Money, Feliz Beamravis B, FNP  prazosin (MINIPRESS) 1 MG capsule Take 1 mg by mouth at bedtime.    [provider]  QUEtiapine (SEROQUEL) 100 MG tablet Take 1 tablet (100 mg total) at bedtime by mouth. For mood control Patient taking differently: Take 200 mg by mouth at bedtime. For mood control 11/07/17   Money, Gerlene Burdockravis B, FNP  QUEtiapine (SEROQUEL) 25 MG tablet Take 1 tablet (25 mg total) 2 (two) times daily as needed by mouth (Agitation). Patient taking differently: Take 25 mg by mouth 3 (three) times daily as needed (Agitation).  11/07/17  Money, Gerlene Burdock, FNP  traZODone (DESYREL) 50 MG tablet Take 1 tablet (50 mg total) at bedtime as needed by mouth for sleep. Patient taking differently: Take 100 mg by mouth at bedtime as needed for sleep.  11/07/17   Money, Gerlene Burdock, FNP    Family History Family History  Problem Relation Age of Onset  . Stroke Mother     Social History Social History   Tobacco Use  . Smoking status: Current Every Day Smoker    Packs/day: 0.00    Types: Cigarettes  . Smokeless tobacco: Former Engineer, water Use Topics  . Alcohol use: Yes    Comment:  Daily  . Drug use: No    Types: Marijuana, Cocaine     Allergies   Acetaminophen and Ibuprofen   Review of Systems Review of Systems Ten systems reviewed and are negative for acute change, except as noted in the HPI.    Physical Exam Updated Vital Signs BP 134/83 (BP Location: Left Arm)   Pulse 78   Temp 98.1 F (36.7 C) (Oral)   Resp 18   SpO2 99%   Physical Exam  Constitutional: He is oriented to person, place, and time. He appears well-developed and well-nourished. No distress.  HENT:  Head: Normocephalic and atraumatic.  Eyes: Conjunctivae are normal. No scleral icterus.  Neck: Normal range of motion. Neck supple.  Cardiovascular: Normal rate, regular rhythm and normal heart sounds.  Pulmonary/Chest: Effort normal and breath sounds normal. No respiratory distress.  Abdominal: Soft. There is no tenderness.  Musculoskeletal: He exhibits no edema.  Neurological: He is alert and oriented to person, place, and time.  Skin: Skin is warm and dry. He is not diaphoretic.  Psychiatric: His behavior is normal.  Nursing note and vitals reviewed.    ED Treatments / Results  Labs (all labs ordered are listed, but only abnormal results are displayed) Labs Reviewed  COMPREHENSIVE METABOLIC PANEL - Abnormal; Notable for the following components:      Result Value   Sodium 126 (*)    Potassium 3.3 (*)    Chloride 85 (*)    Glucose, Bld 136 (*)    BUN <5 (*)    AST 181 (*)    ALT 191 (*)    Anion gap 17 (*)    All other components within normal limits  ETHANOL - Abnormal; Notable for the following components:   Alcohol, Ethyl (B) 224 (*)    All other components within normal limits  ACETAMINOPHEN LEVEL - Abnormal; Notable for the following components:   Acetaminophen (Tylenol), Serum <10 (*)    All other components within normal limits  RAPID URINE DRUG SCREEN, HOSP PERFORMED - Abnormal; Notable for the following components:   Tetrahydrocannabinol POSITIVE (*)    All  other components within normal limits  SALICYLATE LEVEL  CBC    EKG None  Radiology No results found.  Procedures Procedures (including critical care time)  Medications Ordered in ED Medications  LORazepam (ATIVAN) injection 0-4 mg (1 mg Intravenous Given 09/15/18 1948)    Or  LORazepam (ATIVAN) tablet 0-4 mg ( Oral See Alternative 09/15/18 1948)  LORazepam (ATIVAN) injection 0-4 mg (has no administration in time range)    Or  LORazepam (ATIVAN) tablet 0-4 mg (has no administration in time range)  thiamine (VITAMIN B-1) tablet 100 mg ( Oral See Alternative 09/15/18 1948)    Or  thiamine (B-1) injection 100 mg (100 mg Intravenous Given 09/15/18 1948)  sodium chloride 0.9 %  bolus 1,000 mL (1,000 mLs Intravenous New Bag/Given 09/15/18 1947)     Initial Impression / Assessment and Plan / ED Course  I have reviewed the triage vital signs and the nursing notes.  Pertinent labs & imaging results that were available during my care of the patient were reviewed by me and considered in my medical decision making (see chart for details).  Clinical Course as of Sep 16 2031  Wynelle Link Sep 15, 2018  2022 Sodium(!): 126 [AH]  2022 Anion gap(!): 17 [AH]    Clinical Course User Index [AH] Arthor Captain, PA-C    Patient with low sodium.  See Proscar as per currently 4.  He received a bolus of sodium chloride.  [facility].  He is medically clear for psychiatric admission.  Final Clinical Impressions(s) / ED Diagnoses   Final diagnoses:  None    ED Discharge Orders    None       Arthor Captain, PA-C 09/16/18 0003    Alvira Monday, MD 09/16/18 2059

## 2018-09-15 NOTE — ED Notes (Signed)
TTS being done at this time.  
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
## 2018-09-15 NOTE — ED Triage Notes (Addendum)
Pt requesting detox from ETOH.  Last ETOH on the way to the hospital.  Denies SI at this time but states he has suicidal thoughts daily with a plan to "drink myself to death."

## 2018-09-15 NOTE — BH Assessment (Signed)
Received TTS consult request. Spoke to WilmingtonBobby, RN who said Pt is in a hallway and no rooms are currently available. [facility] staff will call TTS at [phone] when Pt is ready for assessment.   Harlin RainFord Ellis Patsy BaltimoreWarrick Jr, LPC, [facility], [facility] Triage Specialist [phone]

## 2018-09-15 NOTE — ED Notes (Signed)
Attempted report 

## 2018-09-16 ENCOUNTER — Encounter (HOSPITAL_COMMUNITY): Payer: Self-pay

## 2018-09-16 ENCOUNTER — Other Ambulatory Visit: Payer: Self-pay

## 2018-09-16 DIAGNOSIS — F603 Borderline personality disorder: Secondary | ICD-10-CM

## 2018-09-16 DIAGNOSIS — R45851 Suicidal ideations: Secondary | ICD-10-CM

## 2018-09-16 DIAGNOSIS — G4700 Insomnia, unspecified: Secondary | ICD-10-CM

## 2018-09-16 DIAGNOSIS — F332 Major depressive disorder, recurrent severe without psychotic features: Principal | ICD-10-CM

## 2018-09-16 DIAGNOSIS — F4310 Post-traumatic stress disorder, unspecified: Secondary | ICD-10-CM

## 2018-09-16 DIAGNOSIS — F1020 Alcohol dependence, uncomplicated: Secondary | ICD-10-CM

## 2018-09-16 DIAGNOSIS — F419 Anxiety disorder, unspecified: Secondary | ICD-10-CM

## 2018-09-16 MED ORDER — LOPERAMIDE HCL 2 MG PO CAPS
2.0000 mg | ORAL_CAPSULE | ORAL | Status: DC | PRN
  Administered 2018-09-16: 4 mg via ORAL
  Filled 2018-09-16: qty 2

## 2018-09-16 MED ORDER — PANTOPRAZOLE SODIUM 40 MG PO TBEC
40.0000 mg | DELAYED_RELEASE_TABLET | Freq: Every day | ORAL | Status: DC
  Administered 2018-09-16 – 2018-09-18 (×3): 40 mg via ORAL
  Filled 2018-09-16: qty 14
  Filled 2018-09-16 (×2): qty 1
  Filled 2018-09-16: qty 14
  Filled 2018-09-16 (×3): qty 1

## 2018-09-16 MED ORDER — ENSURE ENLIVE PO LIQD
237.0000 mL | Freq: Two times a day (BID) | ORAL | Status: DC
  Administered 2018-09-17 (×2): 237 mL via ORAL

## 2018-09-16 MED ORDER — LORAZEPAM 1 MG PO TABS
1.0000 mg | ORAL_TABLET | Freq: Three times a day (TID) | ORAL | Status: AC
  Administered 2018-09-17 (×3): 1 mg via ORAL
  Filled 2018-09-16 (×4): qty 1

## 2018-09-16 MED ORDER — VITAMIN B-1 100 MG PO TABS
100.0000 mg | ORAL_TABLET | Freq: Every day | ORAL | Status: DC
  Administered 2018-09-17 – 2018-09-18 (×2): 100 mg via ORAL
  Filled 2018-09-16 (×4): qty 1

## 2018-09-16 MED ORDER — ONDANSETRON 4 MG PO TBDP
4.0000 mg | ORAL_TABLET | Freq: Four times a day (QID) | ORAL | Status: DC | PRN
  Administered 2018-09-16 – 2018-09-17 (×3): 4 mg via ORAL
  Filled 2018-09-16 (×3): qty 1

## 2018-09-16 MED ORDER — TRAZODONE HCL 100 MG PO TABS
100.0000 mg | ORAL_TABLET | Freq: Every evening | ORAL | Status: DC | PRN
  Administered 2018-09-16 (×2): 100 mg via ORAL
  Filled 2018-09-16 (×5): qty 1

## 2018-09-16 MED ORDER — MIRTAZAPINE 15 MG PO TABS
15.0000 mg | ORAL_TABLET | Freq: Every day | ORAL | Status: DC
  Administered 2018-09-16 – 2018-09-18 (×3): 15 mg via ORAL
  Filled 2018-09-16: qty 1
  Filled 2018-09-16: qty 14
  Filled 2018-09-16 (×2): qty 1
  Filled 2018-09-16: qty 14

## 2018-09-16 MED ORDER — LORAZEPAM 1 MG PO TABS
1.0000 mg | ORAL_TABLET | Freq: Two times a day (BID) | ORAL | Status: AC
  Administered 2018-09-18 (×2): 1 mg via ORAL
  Filled 2018-09-16 (×2): qty 1

## 2018-09-16 MED ORDER — LORAZEPAM 1 MG PO TABS: 1.0000 mg | ORAL_TABLET | Freq: Every day | ORAL | Status: DC

## 2018-09-16 MED ORDER — QUETIAPINE FUMARATE 100 MG PO TABS
100.0000 mg | ORAL_TABLET | Freq: Every day | ORAL | Status: DC
  Administered 2018-09-16: 100 mg via ORAL
  Filled 2018-09-16 (×2): qty 1

## 2018-09-16 MED ORDER — MAGNESIUM HYDROXIDE 400 MG/5ML PO SUSP
30.0000 mL | Freq: Every day | ORAL | Status: DC | PRN
  Administered 2018-09-17: 30 mL via ORAL
  Filled 2018-09-16: qty 30

## 2018-09-16 MED ORDER — ADULT MULTIVITAMIN W/MINERALS CH
1.0000 | ORAL_TABLET | Freq: Every day | ORAL | Status: DC
  Administered 2018-09-16 – 2018-09-18 (×3): 1 via ORAL
  Filled 2018-09-16 (×5): qty 1

## 2018-09-16 MED ORDER — THIAMINE HCL 100 MG/ML IJ SOLN
100.0000 mg | Freq: Once | INTRAMUSCULAR | Status: AC
  Administered 2018-09-16: 100 mg via INTRAMUSCULAR
  Filled 2018-09-16: qty 2

## 2018-09-16 MED ORDER — LORAZEPAM 1 MG PO TABS
1.0000 mg | ORAL_TABLET | Freq: Four times a day (QID) | ORAL | Status: AC
  Administered 2018-09-16 (×3): 1 mg via ORAL
  Filled 2018-09-16 (×2): qty 1

## 2018-09-16 MED ORDER — ALUM & MAG HYDROXIDE-SIMETH 200-200-20 MG/5ML PO SUSP: 30.0000 mL | ORAL | Status: DC | PRN

## 2018-09-16 MED ORDER — HYDROXYZINE HCL 25 MG PO TABS
25.0000 mg | ORAL_TABLET | Freq: Four times a day (QID) | ORAL | Status: DC | PRN
  Administered 2018-09-16 – 2018-09-18 (×5): 25 mg via ORAL
  Filled 2018-09-16: qty 20
  Filled 2018-09-16 (×4): qty 1

## 2018-09-16 MED ORDER — LORAZEPAM 1 MG PO TABS
1.0000 mg | ORAL_TABLET | Freq: Four times a day (QID) | ORAL | Status: DC | PRN
  Administered 2018-09-16 – 2018-09-17 (×2): 1 mg via ORAL
  Filled 2018-09-16 (×2): qty 1

## 2018-09-16 MED ORDER — NICOTINE 21 MG/24HR TD PT24
21.0000 mg | MEDICATED_PATCH | Freq: Every day | TRANSDERMAL | Status: DC
  Administered 2018-09-16 – 2018-09-17 (×2): 21 mg via TRANSDERMAL
  Filled 2018-09-16 (×3): qty 1

## 2018-09-16 NOTE — Progress Notes (Signed)
Nursing Progress Note: 7p-7a D: Pt currently presents with a sad/flat/depressed affect and behavior. Pt states "I feel like shit. I can't do anything but lie here." Not Interacting with the milieu. Pt reports good sleep during the previous night with current medication regimen. Pt did not attend wrap-up group.  A: Pt provided with medications per providers orders. Pt's labs and vitals were monitored throughout the night. Pt supported emotionally and encouraged to express concerns and questions. Pt educated on medications.  R: Pt's safety ensured with 15 minute and environmental checks. Pt currently denies SI, HI, and AVH. Pt verbally contracts to seek staff if SI,HI, or AVH occurs and to consult with staff before acting on any harmful thoughts. Will continue to monitor.
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
## 2018-09-16 NOTE — Progress Notes (Signed)
NUTRITION ASSESSMENT  Pt identified as at risk on the Malnutrition Screen Tool  INTERVENTION: Supplements: will order Ensure Enlive po BID, each supplement provides 350 kcal and 20 grams of protein   NUTRITION DIAGNOSIS: Unintentional weight loss related to sub-optimal intake as evidenced by pt report.   Goal: Pt to meet >/= 90% of their estimated nutrition needs.  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 He last drank on the way to the hospital on 9/22. Patient had reported seizures when he stops drinking alcohol; he drinks at least a 12-pack of beer/day. Patient with increasing SI PTA and had SA last year.  Per chart review, current weight is 108 lb and weight on 11/14/17 was 130 lb. This indicates 22 lb weight loss (17% body weight) in the past 10 months. Unable to determine if this weight loss occurred more acutely.   Will order Ensure to supplement. Continue to encourage PO intakes of meals, supplements, and snacks.    27 y.o. male  Height: Ht Readings from Last 1 Encounters:  09/15/18 5' 5.98" (1.676 m)    Weight: Wt Readings from Last 1 Encounters:  09/15/18 49 kg    Weight Hx: Wt Readings from Last 10 Encounters:  09/15/18 49 kg  11/14/17 59 kg  11/10/17 56.7 kg  11/03/17 51.3 kg  11/02/17 50.8 kg  10/25/17 51.1 kg  08/13/17 54.4 kg  06/04/16 54.4 kg  05/02/16 59 kg  05/02/16 59 kg    BMI:  Body mass index is 17.44 kg/m. Pt meets criteria for underweight based on current BMI.  Estimated Nutritional Needs: Kcal: 25-30 kcal/kg Protein: > 1 gram protein/kg Fluid: 1 ml/kcal  Diet Order:  Diet Order            Diet regular Room service appropriate? Yes; Fluid consistency: Thin  Diet effective now             Pt is also offered choice of unit snacks mid-morning and mid-afternoon.  Pt is eating as desired.   Lab results and medications reviewed.     Trenton GammonJessica Wednesday Ericsson, MS, RD, LDN,  [facility] Inpatient Clinical Dietitian Pager # [phone] After hours/weekend pager # [phone]

## 2018-09-16 NOTE — BHH Group Notes (Signed)
LCSW Group Therapy Note   09/16/2018 1:15pm   Type of Therapy and Topic:  Group Therapy:  Overcoming Obstacles   Participation Level:  Did Not Attend--pt invited. Chose to remain in bed.    Description of Group:    In this group patients will be encouraged to explore what they see as obstacles to their own wellness and recovery. They will be guided to discuss their thoughts, feelings, and behaviors related to these obstacles. The group will process together ways to cope with barriers, with attention given to specific choices patients can make. Each patient will be challenged to identify changes they are motivated to make in order to overcome their obstacles. This group will be process-oriented, with patients participating in exploration of their own experiences as well as giving and receiving support and challenge from other group members.   Therapeutic Goals: 1. Patient will identify personal and current obstacles as they relate to admission. 2. Patient will identify barriers that currently interfere with their wellness or overcoming obstacles.  3. Patient will identify feelings, thought process and behaviors related to these barriers. 4. Patient will identify two changes they are willing to make to overcome these obstacles:      Summary of Patient Progress   x   Therapeutic Modalities:   Cognitive Behavioral Therapy Solution Focused Therapy Motivational Interviewing Relapse Prevention Therapy  Rona RavensHeather S Meghan Tiemann, LCSW 09/16/2018 2:53 PM

## 2018-09-16 NOTE — Progress Notes (Signed)
Recreation Therapy Notes  Date: 9.23.19 Time: 0930 Location: 300 Hall Dayroom  Group Topic: Stress Management  Goal Area(s) Addresses:  Patient will verbalize importance of using healthy stress management.  Patient will identify positive emotions associated with healthy stress management.   Intervention: Stress Management  Activity :  Guided Imagery.  LRT introduced the stress management technique of guided imagery.  LRT read a script to guide patients on a journey to their peaceful place.  Patients were to follow along as script was read.  Education:  Stress Management, Discharge Planning.   Education Outcome: Acknowledges edcuation/In group clarification offered/Needs additional education  Clinical Observations/Feedback: Pt did not attend group.      Caroll RancherMarjette Tacuma Graffam, LRT/CTRS         Caroll RancherLindsay, Arisbel Maione A 09/16/2018 12:11 PM

## 2018-09-16 NOTE — [facility] Group Notes (Signed)
Pt invited.  Did not attend.   Spiritual care group on grief and loss facilitated by chaplain Burnis KingfisherMatthew Wilene Pharo   Established group norm of speaking from own life experience. Group goal of establishing open and affirming space for members to share loss and experience with grief, normalize grief experience and provide psycho social education and grief support. Group opened with brief discussion and psycho-social ed around grief and loss in relationships and in relation to self.Group engaged in facilitated discussion   Group identified life patterns, circumstances, changes that precipitate losses.  Provided support with group members, engaged in discussion about four tasks of grief Jonita Albee([place]).   Group facilitation drew on Adlerian, Narrative and pastoral support.       Burnis KingfisherMatthew Milta Croson, MDiv, [facility]  Clinical Chaplain  Behavioral Health and [facility]

## 2018-09-16 NOTE — Plan of Care (Signed)
Nurse discussed anxiety, depression, coping skills with patient. 

## 2018-09-16 NOTE — Tx Team (Signed)
Interdisciplinary Treatment and Diagnostic Plan Update  09/16/2018 Time of Session: 0830AM Carlos French MRN: [id number]  Principal Diagnosis: MDD, recurrent, severe  Secondary Diagnoses: Active Problems:   * No active hospital problems. *   Current Medications:  Current Facility-Administered Medications  Medication Dose Route Frequency Provider Last Rate Last Dose  . alum & mag hydroxide-simeth (MAALOX/MYLANTA) 200-200-20 MG/5ML suspension 30 mL  30 mL Oral Q4H PRN Patriciaann Clan E, PA-C      . hydrOXYzine (ATARAX/VISTARIL) tablet 25 mg  25 mg Oral Q6H PRN Patriciaann Clan E, PA-C   25 mg at 09/16/18 0844  . Influenza vac split quadrivalent PF (FLUARIX) injection 0.5 mL  0.5 mL Intramuscular Tomorrow-1000 Mallie Darting, Cordie Grice, MD      . magnesium hydroxide (MILK OF MAGNESIA) suspension 30 mL  30 mL Oral Daily PRN Laverle Hobby, PA-C      . traZODone (DESYREL) tablet 100 mg  100 mg Oral QHS,MR X 1 Simon, Spencer E, PA-C       PTA Medications: Medications Prior to Admission  Medication Sig Dispense Refill Last Dose  . albuterol (PROVENTIL HFA;VENTOLIN HFA) 108 (90 Base) MCG/ACT inhaler Inhale 2 puffs into the lungs every 4 (four) hours as needed for wheezing or shortness of breath. 1 Inhaler 0   . gabapentin (NEURONTIN) 100 MG capsule Take 1 capsule (100 mg total) 3 (three) times daily by mouth. For withdrawal (Patient not taking: Reported on 11/14/2017) 90 capsule 0 Not Taking at Unknown time  . hydrOXYzine (ATARAX/VISTARIL) 25 MG tablet Take 1 tablet (25 mg total) 3 (three) times daily as needed by mouth for anxiety. 30 tablet 0 11/14/2017 at prn  . mirtazapine (REMERON) 15 MG tablet Take 1 tablet (15 mg total) at bedtime by mouth. For mood control 30 tablet 0 11/13/2017 at Unknown time  . omeprazole (PRILOSEC) 20 MG capsule Take 1 capsule (20 mg total) by mouth daily. 30 capsule 0   . pantoprazole (PROTONIX) 20 MG tablet Take 1 tablet (20 mg total) daily by mouth. For acid reflux 30  tablet 0 11/14/2017 at Unknown time  . prazosin (MINIPRESS) 1 MG capsule Take 1 mg by mouth at bedtime.   11/13/2017 at Unknown time  . QUEtiapine (SEROQUEL) 100 MG tablet Take 1 tablet (100 mg total) at bedtime by mouth. For mood control (Patient taking differently: Take 200 mg by mouth at bedtime. For mood control) 30 tablet 0 11/13/2017 at Unknown time  . QUEtiapine (SEROQUEL) 25 MG tablet Take 1 tablet (25 mg total) 2 (two) times daily as needed by mouth (Agitation). (Patient taking differently: Take 25 mg by mouth 3 (three) times daily as needed (Agitation). ) 60 tablet 0 11/14/2017 at Unknown time  . traZODone (DESYREL) 50 MG tablet Take 1 tablet (50 mg total) at bedtime as needed by mouth for sleep. (Patient taking differently: Take 100 mg by mouth at bedtime as needed for sleep. ) 30 tablet 0 11/13/2017 at Unknown time    Patient Stressors: Financial difficulties Substance abuse  Patient Strengths: Ability for insight Average or above average intelligence General fund of knowledge  Treatment Modalities: Medication Management, Group therapy, Case management,  1 to 1 session with clinician, Psychoeducation, Recreational therapy.   Physician Treatment Plan for Primary Diagnosis: MDD, recurrent, severe Medication Management: Evaluate patient's response, side effects, and tolerance of medication regimen.  Therapeutic Interventions: 1 to 1 sessions, Unit Group sessions and Medication administration.  Evaluation of Outcomes: Not Met  Physician Treatment Plan for Secondary Diagnosis:  Active Problems:   * No active hospital problems. *   Medication Management: Evaluate patient's response, side effects, and tolerance of medication regimen.  Therapeutic Interventions: 1 to 1 sessions, Unit Group sessions and Medication administration.  Evaluation of Outcomes: Not Met   RN Treatment Plan for Primary Diagnosis: MDD, recurrent, severe Long Term Goal(s): Knowledge of disease and  therapeutic regimen to maintain health will improve  Short Term Goals: Ability to remain free from injury will improve, Ability to verbalize frustration and anger appropriately will improve, Ability to demonstrate self-control and Ability to disclose and discuss suicidal ideas  Medication Management: RN will administer medications as ordered by provider, will assess and evaluate patient's response and provide education to patient for prescribed medication. RN will report any adverse and/or side effects to prescribing provider.  Therapeutic Interventions: 1 on 1 counseling sessions, Psychoeducation, Medication administration, Evaluate responses to treatment, Monitor vital signs and CBGs as ordered, Perform/monitor CIWA, COWS, AIMS and Fall Risk screenings as ordered, Perform wound care treatments as ordered.  Evaluation of Outcomes: Not Met   LCSW Treatment Plan for Primary Diagnosis: MDD, recurrent, severe Long Term Goal(s): Safe transition to appropriate next level of care at discharge, Engage patient in therapeutic group addressing interpersonal concerns.  Short Term Goals: Engage patient in aftercare planning with referrals and resources, Increase emotional regulation, Facilitate patient progression through stages of change regarding substance use diagnoses and concerns and Identify triggers associated with mental health/substance abuse issues  Therapeutic Interventions: Assess for all discharge needs, 1 to 1 time with Social worker, Explore available resources and support systems, Assess for adequacy in community support network, Educate family and significant other(s) on suicide prevention, Complete Psychosocial Assessment, Interpersonal group therapy.  Evaluation of Outcomes: Not Met   Progress in Treatment: Attending groups: No. New to unit. Continuing to assess.  Participating in groups: No. Taking medication as prescribed: Yes. Toleration medication: Yes. Family/Significant other  contact made: No, will contact:  family member if pt consents to collateral contact. Patient understands diagnosis: Yes. Discussing patient identified problems/goals with staff: Yes. Medical problems stabilized or resolved: Yes. Denies suicidal/homicidal ideation: Yes. Issues/concerns per patient self-inventory: No. Other: n/a   New problem(s) identified: No, Describe:  n/a  New Short Term/Long Term Goal(s): detox, medication management for mood stabilization; elimination of SI thoughts; development of comprehensive mental wellness/sobriety plan.   Patient Goals:  "I want to get detoxed and get into a rehab."  Discharge Plan or Barriers: CSW assessing for appropriate referrals. [place] pamphlet, Mobile Crisis information, and AA/NA information provided to patient for additional community support and resources.   Reason for Continuation of Hospitalization: Anxiety Depression Medication stabilization Suicidal ideation Withdrawal symptoms  Estimated Length of Stay: Thursday, 09/19/18  Attendees: Patient: 09/16/2018 9:04 AM  Physician: Dr. Mallie Darting MD; Dr. Nancy Fetter MD 09/16/2018 9:04 AM  Nursing: Nicoletta Dress RN; [facility] RN 09/16/2018 9:04 AM  RN Care Manager:x 09/16/2018 9:04 AM  Social Worker: Janice Norrie LCSW 09/16/2018 9:04 AM  Recreational Therapist: x 09/16/2018 9:04 AM  Other: Ricky Ala NP 09/16/2018 9:04 AM  Other:  09/16/2018 9:04 AM  Other: 09/16/2018 9:04 AM    Scribe for Treatment Team: Avelina Laine, LCSW 09/16/2018 9:04 AM

## 2018-09-16 NOTE — Progress Notes (Signed)
Patient presents with depressed/sad/flat affect and behavior during admission interview and assessment. VS monitored and recorded. Skin check performed with Kedra MHT and revealed multiple tattoos. Contraband was not found. Patient was oriented to unit and schedule. Pt states "I am here to get to [facility]. I can't keep drinking the way I am. I am drinking at least 12 beers a day. I want detox and stay clean.". Pt denies SI/HI/AVH at this time. PO fluids provided. Safety maintained. Rest encouraged.
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
## 2018-09-16 NOTE — [facility] Group Notes (Signed)
Pt was invited but did not attend orientation/goals group. 

## 2018-09-16 NOTE — Tx Team (Signed)
Initial Treatment Plan 09/16/2018 5:12 AM Carlos French [id number]    PATIENT STRESSORS: Financial difficulties Substance abuse   PATIENT STRENGTHS: Ability for insight Average or above average intelligence General fund of knowledge   PATIENT IDENTIFIED PROBLEMS: "I want to detox"  "I want to go to rehab"                   DISCHARGE CRITERIA:  Ability to meet basic life and health needs Adequate post-discharge living arrangements Improved stabilization in mood, thinking, and/or behavior  PRELIMINARY DISCHARGE PLAN: Outpatient therapy  PATIENT/FAMILY INVOLVEMENT: This treatment plan has been presented to and reviewed with the patient, Carlos French.  The patient and family have been given the opportunity to ask questions and make suggestions.  Jonetta SpeakAshton E Severina Sykora, RN 09/16/2018, 5:12 AM

## 2018-09-16 NOTE — Progress Notes (Signed)
D:  Patient's self inventory sheet, patient has poor sleep, sleep medication not helpful.  Poor appetite, low energy level, poor concentration.  Rated depression, hopeless and anxiety #10.  Withdrawals, tremors, diarrhea, chilling, cravings, agitation, nausea, runny nose, irritability.  SI, sometimes, no plan, contracts for safety.  Physical problems, detoxing.  Denied physical pain.   A;  Medications administered per MD orders.  Emotional support and encouragement given patient. R:  Patient denied SI and HI, contracts for safety.  Denied A/V hallucinations.  Safety maintained with 15 minute checks. Patient denied SI while talking to nurse this morning, contracts for safety, no plan.

## 2018-09-17 DIAGNOSIS — F4310 Post-traumatic stress disorder, unspecified: Secondary | ICD-10-CM

## 2018-09-17 DIAGNOSIS — F17210 Nicotine dependence, cigarettes, uncomplicated: Secondary | ICD-10-CM

## 2018-09-17 DIAGNOSIS — F1490 Cocaine use, unspecified, uncomplicated: Secondary | ICD-10-CM

## 2018-09-17 DIAGNOSIS — K219 Gastro-esophageal reflux disease without esophagitis: Secondary | ICD-10-CM

## 2018-09-17 DIAGNOSIS — F1290 Cannabis use, unspecified, uncomplicated: Secondary | ICD-10-CM

## 2018-09-17 MED ORDER — QUETIAPINE FUMARATE 200 MG PO TABS
200.0000 mg | ORAL_TABLET | Freq: Every day | ORAL | Status: DC
  Administered 2018-09-17 – 2018-09-18 (×2): 200 mg via ORAL
  Filled 2018-09-17: qty 14
  Filled 2018-09-17: qty 1
  Filled 2018-09-17: qty 14
  Filled 2018-09-17: qty 1

## 2018-09-17 MED ORDER — TRAZODONE HCL 100 MG PO TABS
100.0000 mg | ORAL_TABLET | Freq: Every day | ORAL | Status: DC
  Administered 2018-09-17 – 2018-09-18 (×2): 100 mg via ORAL
  Filled 2018-09-17: qty 7
  Filled 2018-09-17 (×2): qty 1
  Filled 2018-09-17: qty 7

## 2018-09-17 MED ORDER — QUETIAPINE FUMARATE 50 MG PO TABS
50.0000 mg | ORAL_TABLET | Freq: Every day | ORAL | Status: DC
  Administered 2018-09-17 – 2018-09-18 (×2): 50 mg via ORAL
  Filled 2018-09-17 (×2): qty 14
  Filled 2018-09-17 (×4): qty 1

## 2018-09-17 NOTE — Progress Notes (Signed)
Pt is receptive and cooperative. Pt has been interactive, attending recreation, and meals. Pt ambulates in the hallways. Pt was anxious after phone call with mother. Pt was easily deescalated. Pt is progressing well today.

## 2018-09-17 NOTE — [facility] Group Notes (Signed)
Pt was invited but did not attend orientation/goals group. 

## 2018-09-17 NOTE — Progress Notes (Signed)
[facility] MD Progress Note  09/17/2018 11:44 AM [name]  MRN:  [id number]  Subjective: Carlos French reports, "I'm having a bad day. The withdrawal symptoms are bad still. I had diarrhea all day yesterday. Today, my stomach is hurting. I have bad tremors, cold sweats. I'm irritated & depressed. I got a lot in my mind. The Seroquel & Remeron needs to be increased. I used to be on Seroquel 200 mg bid . I'm feeling very depressed & my anxiety is high. I used to be on a lot more medicines that I'm on currently. I don't have any appetite, I am not sleeping at night. Trazodone has helped me in the past".  Carlos French is a 27 y/o M with history of MDD, borderline personality disorder, and alcohol use disorder who was admitted voluntarily from [facility] with worsening depression, SI without plan, medication non-adherence, and worsening use of alcohol. Pt was medically cleared and then transferred to [facility] for additional treatment and stabilization. Upon initial interview, pt shares, "I just want to get put back on my meds. I've been really depressed and drinking a lot." Pt reports worsening mood symptoms and alcohol use over the last 2 months and he cites stressors of his mother having recent admission to ICU for aspiration pneumonia (associated with drinking) and loss of his job about 2 months ago associated with alcohol use. Pt reports depressed mood, poor sleep with initial and middle insomnia, guilty feelings, low energy, poor concentration, poor appetite, and psychomotor retardation.  Carlos French is seen, chart reviewed. The chart findings discussed with the treatment team. He presents alert, oriented & aware of situation. He is lying down in bed. He is verbally responsive. He says he is having a bad day. He is complaining of having bad alcohol withdrawal symptoms. He is complaining of worsening depression & an anxiety. He has not been visible on the unit & has not attended any group sessions as of yet. He does however, deny any  SIHI, AVH, delusional thoughts or paranoia. He does not appear to be responding to any internal stimuli. His Seroquel dose has been increased from 100 mg to 200 mg Q hs with addition of Seroquel 50 mg po daily for agitation. His Trazodone is charged from 100 mg prn to Trazodone 100 po Q hs routinely. Carlos French is instructed & encouraged to attend & participate in the group counseling sessions. He is interested in going a substance abuse treatment after discharge. He has agreed to continue current plan of care as already in progress.  Principal Problem: MDD (major depressive disorder), recurrent severe, without psychosis ([place])  Diagnosis:   Patient Active Problem List   Diagnosis Date Noted  . PTSD (post-traumatic stress disorder) [F43.10] 09/16/2018  . MDD (major depressive disorder), recurrent severe, without psychosis ([place]) [F33.2] 11/15/2017  . MDD (major depressive disorder), severe ([place]) [F32.2] 11/03/2017  . Depression [F32.9]   . Borderline personality disorder ([place]) [F60.3] 02/10/2016  . Substance induced mood disorder ([place]) [F19.94] 02/09/2016  . Alcohol withdrawal ([place]) [F10.239] 08/23/2015  . Tobacco use disorder [F17.200] 08/23/2015  . Cocaine use disorder, severe, dependence ([place]) [F14.20] 08/11/2015  . Alcohol use disorder, severe, dependence ([place]) [F10.20] 08/11/2015  . Cannabis use disorder, severe, dependence ([place]) [F12.20] 08/11/2015   Total Time spent with patient: 30 minutes  Past Psychiatric History: See H&P  Past Medical History:  Past Medical History:  Diagnosis Date  . Allergy   . Anxiety   . Bipolar 1 disorder ([place])   . Depression   .  GERD (gastroesophageal reflux disease)   . GI (gastrointestinal bleed)   . GI bleeding   . PTSD (post-traumatic stress disorder)   . Suicidal ideation     Past Surgical History:  Procedure Laterality Date  . NO PAST SURGERIES     Family History:  Family History  Problem Relation Age of Onset  . Stroke Mother    Family  Psychiatric  History: See H&P.  Social History:  Social History   Substance and Sexual Activity  Alcohol Use Yes  . Alcohol/week: 84.0 standard drinks  . Types: 84 Cans of beer per week   Comment: Daily     Social History   Substance and Sexual Activity  Drug Use No  . Types: Marijuana, Cocaine    Social History   Socioeconomic History  . Marital status: Single    Spouse name: Not on file  . Number of children: Not on file  . Years of education: Not on file  . Highest education level: Not on file  Occupational History  . Not on file  Social Needs  . Financial resource strain: Not on file  . Food insecurity:    Worry: Not on file    Inability: Not on file  . Transportation needs:    Medical: Not on file    Non-medical: Not on file  Tobacco Use  . Smoking status: Current Every Day Smoker    Packs/day: 1.00    Types: Cigarettes  . Smokeless tobacco: Former Network engineer and Sexual Activity  . Alcohol use: Yes    Alcohol/week: 84.0 standard drinks    Types: 84 Cans of beer per week    Comment: Daily  . Drug use: No    Types: Marijuana, Cocaine  . Sexual activity: Not on file  Lifestyle  . Physical activity:    Days per week: Not on file    Minutes per session: Not on file  . Stress: Not on file  Relationships  . Social connections:    Talks on phone: Not on file    Gets together: Not on file    Attends religious service: Not on file    Active member of club or organization: Not on file    Attends meetings of clubs or organizations: Not on file    Relationship status: Not on file  Other Topics Concern  . Not on file  Social History Narrative  . Not on file   Additional Social History:   Sleep: Poor  Appetite:  Fair  Current Medications: Current Facility-Administered Medications  Medication Dose Route Frequency Provider Last Rate Last Dose  . alum & mag hydroxide-simeth (MAALOX/MYLANTA) 200-200-20 MG/5ML suspension 30 mL  30 mL Oral Q4H PRN  Patriciaann Clan E, PA-C      . feeding supplement (ENSURE ENLIVE) (ENSURE ENLIVE) liquid 237 mL  237 mL Oral BID BM Sharma Covert, MD   237 mL at 09/17/18 1018  . hydrOXYzine (ATARAX/VISTARIL) tablet 25 mg  25 mg Oral Q6H PRN Patriciaann Clan E, PA-C   25 mg at 09/16/18 0844  . loperamide (IMODIUM) capsule 2-4 mg  2-4 mg Oral PRN Pennelope Bracken, MD   4 mg at 09/16/18 1725  . LORazepam (ATIVAN) tablet 1 mg  1 mg Oral Q6H PRN Pennelope Bracken, MD   1 mg at 09/16/18 1048  . LORazepam (ATIVAN) tablet 1 mg  1 mg Oral TID Pennelope Bracken, MD   1 mg at 09/17/18 7591   Followed by  . [  START ON 09/18/2018] LORazepam (ATIVAN) tablet 1 mg  1 mg Oral BID Pennelope Bracken, MD       Followed by  . [START ON 09/19/2018] LORazepam (ATIVAN) tablet 1 mg  1 mg Oral Daily [place], Christopher T, MD      . magnesium hydroxide (MILK OF MAGNESIA) suspension 30 mL  30 mL Oral Daily PRN Patriciaann Clan E, PA-C      . mirtazapine (REMERON) tablet 15 mg  15 mg Oral QHS Pennelope Bracken, MD   15 mg at 09/16/18 2113  . multivitamin with minerals tablet 1 tablet  1 tablet Oral Daily Pennelope Bracken, MD   1 tablet at 09/17/18 [id number]  . nicotine (NICODERM CQ - dosed in mg/24 hours) patch 21 mg  21 mg Transdermal Daily Pennelope Bracken, MD   21 mg at 09/17/18 0834  . ondansetron (ZOFRAN-ODT) disintegrating tablet 4 mg  4 mg Oral Q6H PRN Pennelope Bracken, MD   4 mg at 09/16/18 1725  . pantoprazole (PROTONIX) EC tablet 40 mg  40 mg Oral Daily Pennelope Bracken, MD   40 mg at 09/17/18 0768  . QUEtiapine (SEROQUEL) tablet 200 mg  200 mg Oral QHS Galen Malkowski I, NP      . QUEtiapine (SEROQUEL) tablet 50 mg  50 mg Oral Daily Denesha Brouse I, NP      . thiamine (VITAMIN B-1) tablet 100 mg  100 mg Oral Daily Pennelope Bracken, MD   100 mg at 09/17/18 0881  . traZODone (DESYREL) tablet 100 mg  100 mg Oral QHS Lindell Spar I, NP       Lab Results:  Results  for orders placed or performed during the hospital encounter of 09/15/18 (from the past 48 hour(s))  Comprehensive metabolic panel     Status: Abnormal   Collection Time: 09/15/18  4:22 PM  Result Value Ref Range   Sodium 126 (L) 135 - 145 mmol/L   Potassium 3.3 (L) 3.5 - 5.1 mmol/L   Chloride 85 (L) 98 - 111 mmol/L   CO2 24 22 - 32 mmol/L   Glucose, Bld 136 (H) 70 - 99 mg/dL   BUN <5 (L) 6 - 20 mg/dL   Creatinine, Ser 0.80 0.61 - 1.24 mg/dL   Calcium 8.9 8.9 - 10.3 mg/dL   Total Protein 7.3 6.5 - 8.1 g/dL   Albumin 4.3 3.5 - 5.0 g/dL   AST 181 (H) 15 - 41 U/L   ALT 191 (H) 0 - 44 U/L   Alkaline Phosphatase 56 38 - 126 U/L   Total Bilirubin 0.7 0.3 - 1.2 mg/dL   GFR calc non Af Amer >60 >60 mL/min   GFR calc Af Amer >60 >60 mL/min    Comment: (NOTE) The eGFR has been calculated using the CKD EPI equation. This calculation has not been validated in all clinical situations. eGFR's persistently <60 mL/min signify possible Chronic Kidney Disease.    Anion gap 17 (H) 5 - 15    Comment: Performed at [facility], [place] [address], [place], [place] [address]  Ethanol     Status: Abnormal   Collection Time: 09/15/18  4:22 PM  Result Value Ref Range   Alcohol, Ethyl (B) 224 (H) <10 mg/dL    Comment: (NOTE) Lowest detectable limit for serum alcohol is 10 mg/dL. For medical purposes only. Performed at [facility], [place] [address] [place], [place] [address]   Salicylate level  Status: None   Collection Time: 09/15/18  4:22 PM  Result Value Ref Range   Salicylate Lvl <9.5 2.8 - 30.0 mg/dL    Comment: Performed at [facility], [place] [address], [place], [place] [address]  Acetaminophen level     Status: Abnormal   Collection Time: 09/15/18  4:22 PM  Result Value Ref Range   Acetaminophen (Tylenol), Serum <10 (L) 10 - 30 ug/mL    Comment: (NOTE) Therapeutic concentrations vary significantly. A range of 10-30 ug/mL  may be an effective concentration for many  patients. However, some  are best treated at concentrations outside of this range. Acetaminophen concentrations >150 ug/mL at 4 hours after ingestion  and >50 ug/mL at 12 hours after ingestion are often associated with  toxic reactions. Performed at [facility], [place] [address] [place] [address]   cbc     Status: None   Collection Time: 09/15/18  4:22 PM  Result Value Ref Range   WBC 4.6 4.0 - 10.5 K/uL   RBC 4.72 4.22 - 5.81 MIL/uL   Hemoglobin 15.2 13.0 - 17.0 g/dL   HCT 43.4 39.0 - 52.0 %   MCV 91.9 78.0 - 100.0 fL   MCH 32.2 26.0 - 34.0 pg   MCHC 35.0 30.0 - 36.0 g/dL   RDW 12.3 11.5 - 15.5 %   Platelets 180 150 - 400 K/uL    Comment: Performed at [facility], [place] [address] [place], [place] [address]  Rapid urine drug screen (hospital performed)     Status: Abnormal   Collection Time: 09/15/18  4:51 PM  Result Value Ref Range   Opiates NONE DETECTED NONE DETECTED   Cocaine NONE DETECTED NONE DETECTED   Benzodiazepines NONE DETECTED NONE DETECTED   Amphetamines NONE DETECTED NONE DETECTED   Tetrahydrocannabinol POSITIVE (A) NONE DETECTED   Barbiturates NONE DETECTED NONE DETECTED    Comment: (NOTE) DRUG SCREEN FOR MEDICAL PURPOSES ONLY.  IF CONFIRMATION IS NEEDED FOR ANY PURPOSE, NOTIFY LAB WITHIN 5 DAYS. LOWEST DETECTABLE LIMITS FOR URINE DRUG SCREEN Drug Class                     Cutoff (ng/mL) Amphetamine and metabolites    1000 Barbiturate and metabolites    200 Benzodiazepine                 366 Tricyclics and metabolites     300 Opiates and metabolites        300 Cocaine and metabolites        300 THC                            50 Performed at [facility], [place] [address] [place], [place] [address]    Blood Alcohol level:  Lab Results  Component Value Date   ETH 224 (H) 09/15/2018   ETH <10 74/25/9563    Metabolic Disorder Labs: Lab Results  Component Value Date   HGBA1C 4.6 02/10/2016   Lab Results  Component  Value Date   PROLACTIN 32.3 (H) 02/10/2016   Lab Results  Component Value Date   CHOL 209 (H) 02/10/2016   TRIG 120 02/10/2016   HDL 86 02/10/2016   CHOLHDL 2.4 02/10/2016   VLDL 24 02/10/2016   LDLCALC 99 02/10/2016   LDLCALC 100 (H) 08/12/2015   Physical Findings: AIMS: Facial and Oral Movements Muscles of Facial Expression:  None, normal Lips and Perioral Area: None, normal Jaw: None, normal Tongue: None, normal,Extremity Movements Upper (arms, wrists, hands, fingers): None, normal Lower (legs, knees, ankles, toes): None, normal, Trunk Movements Neck, shoulders, hips: None, normal, Overall Severity Severity of abnormal movements (highest score from questions above): None, normal Incapacitation due to abnormal movements: None, normal Patient's awareness of abnormal movements (rate only patient's report): No Awareness, Dental Status Current problems with teeth and/or dentures?: No Does patient usually wear dentures?: No  CIWA:  CIWA-Ar Total: 6 COWS:  COWS Total Score: 9  Musculoskeletal: Strength & Muscle Tone: within normal limits Gait & Station: normal Patient leans: N/A  Psychiatric Specialty Exam: Physical Exam  Nursing note and vitals reviewed.   Review of Systems  Psychiatric/Behavioral: Positive for depression and substance abuse (Alcohol use disorder). Negative for hallucinations, memory loss and suicidal ideas. The patient is nervous/anxious and has insomnia.     Blood pressure 112/82, pulse (!) 122, temperature 98.5 F (36.9 C), temperature source Oral, resp. rate 16, height 5' 5.98" (1.676 m), weight 49 kg.Body mass index is 17.44 kg/m.  General Appearance: Casual and Fairly Groomed  Eye Contact:  Good  Speech:  Clear and Coherent and Normal Rate  Volume:  Normal  Mood:  Anxious and Depressed  Affect:  Congruent and Depressed  Thought Process:  Coherent and Goal Directed  Orientation:  Full (Time, Place, and Person)  Thought Content:  Logical   Suicidal Thoughts:  Yes.  without intent/plan  Homicidal Thoughts:  No  Memory:  Immediate;   Fair Recent;   Fair Remote;   Fair  Judgement:  Poor  Insight:  Lacking  Psychomotor Activity:  Normal  Concentration:  Concentration: Fair  Recall:  [employer] of Knowledge:  Fair  Language:  Fair  Akathisia:  No  Handed:    AIMS (if indicated):     Assets:  Desire for Improvement Resilience Social Support  ADL's:  Intact  Cognition:  WNL  Sleep:  Number of Hours: 4.75     Treatment Plan Summary: Daily contact with patient to assess and evaluate symptoms and progress in treatment.  - Continue inpatient hospitalization.  - Will continue today 09/17/2018 plan as below except where it is noted.  Alcohol detox.     - Continue the Ativan detoxification protocols.  Mood control.     - Increased Seroquel from 100 mg po Q hs to Seroquel 200 mg po Q hs.      - Initiated Seroquel 50 mg po daily for agitation.  Depression.     - Continue Mirtazapine 15 mg po Q hs.  Insomnia.     - Changes Trazodone 100 mg po Q prn to Trazodone 100 mg po Q hs routinely.  GERD.    - Continue Protonix 40 mg po daily.  Nicotine withdrawal.    - Continue Nicotine CQ 21 mg topically Q 24 hours.  Patient to attend & participate in the group counseling sessions. Discharge disposition plan ongoing.    Lindell Spar, NP, PMHNP, FNP-BC. 09/17/2018, 11:44 AM

## 2018-09-17 NOTE — [facility] Group Notes (Signed)
[facility] Group Therapy      09/17/2018 3:10 PM  Type of Therapy: Mental Health Association Presentation  Participation Level: Active  Participation Quality: Attentive  Affect: Appropriate  Cognitive: Oriented  Insight: Developing/Improving  Engagement in Therapy: Engaged  Modes of Intervention: Discussion, Education and Socialization  Summary of Progress/Problems: Mental Health Association (MHA) Speaker came to talk about his personal journey with mental health. The pt processed ways by which to relate to the speaker. MHA speaker provided handouts and educational information pertaining to groups and services offered by the MHA. Pt was engaged in speaker's presentation and was receptive to resources provided.    Shalese Strahan LCSWA Clinical Social Worker   

## 2018-09-17 NOTE — Progress Notes (Signed)
D - Pt states "his depression is 10/10, anxiety is 8/10, he did not sleep last night, he is still experiencing night sweats, has no N/V today, and did have diarrhea last night." Pt denies SI/HI and AVH. Pt verbally contracts for safety. Pt denies pain this morning. Pt has visible tremors in hands. Pt denies any pain at this time.  A - Schedule medication administration to pt, per MD orders. Support and encouragement provided. Routine safety checks conducted every 15 minutes.  R - No adverse drug reactions noted. Pt contracts for safety at this time. Pt complaint with medications. Pt remains safe at this time.

## 2018-09-17 NOTE — Progress Notes (Signed)
Nursing Progress Note: 7p-7a D: Pt currently presents with a sad/flat/depressed/irratable affect and behavior. Pt states "I am a wreck. I feel like shit, but I am trying to go to groups." Not Interacting with the milieu. Pt reports poor sleep during the previous night with current medication regimen. Pt did not attend wrap-up group.  A: Pt provided with medications per providers orders. Pt's labs and vitals were monitored throughout the night. Pt supported emotionally and encouraged to express concerns and questions. Pt educated on medications.  R: Pt's safety ensured with 15 minute and environmental checks. Pt currently denies SI, HI, and AVH. Pt verbally contracts to seek staff if SI,HI, or AVH occurs and to consult with staff before acting on any harmful thoughts. Will continue to monitor.

## 2018-09-18 MED ORDER — POTASSIUM CHLORIDE CRYS ER 20 MEQ PO TBCR
20.0000 meq | EXTENDED_RELEASE_TABLET | Freq: Once | ORAL | Status: AC
  Administered 2018-09-18: 20 meq via ORAL
  Filled 2018-09-18 (×2): qty 1

## 2018-09-18 MED ORDER — NICOTINE POLACRILEX 2 MG MT GUM
2.0000 mg | CHEWING_GUM | OROMUCOSAL | Status: DC | PRN
  Administered 2018-09-18: 2 mg via ORAL

## 2018-09-18 MED ORDER — HYDROXYZINE HCL 25 MG PO TABS: 25.0000 mg | ORAL_TABLET | Freq: Four times a day (QID) | ORAL | 0 refills | Status: DC | PRN

## 2018-09-18 MED ORDER — NICOTINE POLACRILEX 2 MG MT GUM
CHEWING_GUM | OROMUCOSAL | Status: AC
  Filled 2018-09-18: qty 6

## 2018-09-18 MED ORDER — QUETIAPINE FUMARATE 200 MG PO TABS: 200.0000 mg | ORAL_TABLET | Freq: Every day | ORAL | 0 refills | Status: DC

## 2018-09-18 MED ORDER — NICOTINE POLACRILEX 2 MG MT GUM
4.0000 mg | CHEWING_GUM | OROMUCOSAL | Status: DC | PRN
  Administered 2018-09-18 (×3): 4 mg via ORAL

## 2018-09-18 MED ORDER — NICOTINE POLACRILEX 4 MG MT GUM: 4.0000 mg | CHEWING_GUM | OROMUCOSAL | 0 refills | Status: DC | PRN

## 2018-09-18 MED ORDER — PANTOPRAZOLE SODIUM 40 MG PO TBEC: 40.0000 mg | DELAYED_RELEASE_TABLET | Freq: Every day | ORAL | 0 refills | Status: DC

## 2018-09-18 MED ORDER — MIRTAZAPINE 15 MG PO TABS: 15.0000 mg | ORAL_TABLET | Freq: Every day | ORAL | 0 refills | Status: DC

## 2018-09-18 MED ORDER — TRAZODONE HCL 100 MG PO TABS: 100.0000 mg | ORAL_TABLET | Freq: Every day | ORAL | 0 refills | Status: DC

## 2018-09-18 MED ORDER — QUETIAPINE FUMARATE 50 MG PO TABS: 50.0000 mg | ORAL_TABLET | Freq: Every day | ORAL | 0 refills | Status: DC

## 2018-09-18 MED ORDER — QUETIAPINE FUMARATE 50 MG PO TABS: 50.0000 mg | ORAL_TABLET | Freq: Two times a day (BID) | ORAL | 0 refills | Status: DC

## 2018-09-18 MED ORDER — QUETIAPINE FUMARATE 50 MG PO TABS
50.0000 mg | ORAL_TABLET | Freq: Two times a day (BID) | ORAL | Status: DC
  Administered 2018-09-18: 50 mg via ORAL
  Filled 2018-09-18 (×4): qty 1

## 2018-09-18 NOTE — [facility] Group Notes (Signed)
[facility] Group Notes:  (Nursing/MHT/Case Management/Adjunct)  Date:  09/18/2018  Time:  5:54 PM   Type of Therapy:  Psychoeducational Skills  Participation Level:  Did Not Attend  Participation Quality:  DID NOT ATTEND  Affect:  DID NOT ATTEND  Cognitive:  DID NOT ATTEND  Insight:  None  Engagement in Group:  DID NOT ATTEND  Modes of Intervention:  DID NOT ATTEND  Summary of Progress/Problems: Pt did not attend patient self inventory group.    Bethann Punches 09/18/2018, 5:54 PM

## 2018-09-18 NOTE — [facility] Suicide Risk Assessment (Signed)
[facility] INPATIENT:  Family/Significant Other Suicide Prevention Education  Suicide Prevention Education:  Education Completed; Harriett Rush (pt's mother) [phone] has been identified by the patient as the family member/significant other with whom the patient will be residing, and identified as the person(s) who will aid the patient in the event of a mental health crisis (suicidal ideations/suicide attempt).  With written consent from the patient, the family member/significant other has been provided the following suicide prevention education, prior to the and/or following the discharge of the patient.  The suicide prevention education provided includes the following:  Suicide risk factors  Suicide prevention and interventions  National Suicide Hotline telephone number  [facility] assessment telephone number  [facility] Emergency Assistance 911  [facility] and/or Residential Mobile Crisis Unit telephone number  Request made of family/significant other to:  Remove weapons (e.g., guns, rifles, knives), all items previously/currently identified as safety concern.    Remove drugs/medications (over-the-counter, prescriptions, illicit drugs), all items previously/currently identified as a safety concern.  The family member/significant other verbalizes understanding of the suicide prevention education information provided.  The family member/significant other agrees to remove the items of safety concern listed above.  Rona Ravens LCSW 09/18/2018, 2:04 PM

## 2018-09-18 NOTE — BHH Suicide Risk Assessment (Signed)
[facility] Discharge Suicide Risk Assessment   Principal Problem: MDD (major depressive disorder), recurrent severe, without psychosis (HCC) Discharge Diagnoses:  Patient Active Problem List   Diagnosis Date Noted  . PTSD (post-traumatic stress disorder) [F43.10] 09/16/2018  . MDD (major depressive disorder), recurrent severe, without psychosis (HCC) [F33.2] 11/15/2017  . MDD (major depressive disorder), severe (HCC) [F32.2] 11/03/2017  . Depression [F32.9]   . Borderline personality disorder (HCC) [F60.3] 02/10/2016  . Substance induced mood disorder (HCC) [F19.94] 02/09/2016  . Alcohol withdrawal (HCC) [F10.239] 08/23/2015  . Tobacco use disorder [F17.200] 08/23/2015  . Cocaine use disorder, severe, dependence (HCC) [F14.20] 08/11/2015  . Alcohol use disorder, severe, dependence (HCC) [F10.20] 08/11/2015  . Cannabis use disorder, severe, dependence (HCC) [F12.20] 08/11/2015    Total Time spent with patient: 30 minutes  Musculoskeletal: Strength & Muscle Tone: within normal limits Gait & Station: normal Patient leans: N/A  Psychiatric Specialty Exam: Review of Systems  Constitutional: Negative for chills and fever.  Respiratory: Negative for cough and shortness of breath.   Cardiovascular: Negative for chest pain.  Gastrointestinal: Negative for abdominal pain, heartburn, nausea and vomiting.  Psychiatric/Behavioral: Negative for depression, hallucinations and suicidal ideas. The patient is not nervous/anxious and does not have insomnia.     Blood pressure 112/77, pulse 61, temperature 98 F (36.7 C), resp. rate 16, height 5' 5.98" (1.676 m), weight 49 kg, SpO2 95 %.Body mass index is 17.44 kg/m.  General Appearance: Casual and Fairly Groomed  Patent attorney::  Good  Speech:  Clear and Coherent and Normal Rate  Volume:  Normal  Mood:  Euthymic  Affect:  Appropriate and Congruent  Thought Process:  Coherent and Goal Directed  Orientation:  Full (Time, Place, and Person)  Thought  Content:  Logical  Suicidal Thoughts:  No  Homicidal Thoughts:  No  Memory:  Immediate;   Fair Recent;   Fair Remote;   Fair  Judgement:  Fair  Insight:  Fair  Psychomotor Activity:  Normal  Concentration:  Fair  Recall:  Good  Fund of Knowledge:Good  Language: Good  Akathisia:  No  Handed:    AIMS (if indicated):     Assets:  Resilience Social Support  Sleep:  Number of Hours: 5.25  Cognition: WNL  ADL's:  Intact   Mental Status Per Nursing Assessment::   On Admission:  NA  Demographic Factors:  Male, Adolescent or young adult and Low socioeconomic status  Loss Factors: Financial problems/change in socioeconomic status  Historical Factors: Impulsivity  Risk Reduction Factors:   Positive social support, Positive therapeutic relationship and Positive coping skills or problem solving skills  Continued Clinical Symptoms:  Depression:   Impulsivity Alcohol/Substance Abuse/Dependencies  Cognitive Features That Contribute To Risk:  None    Suicide Risk:  Minimal: No identifiable suicidal ideation.  Patients presenting with no risk factors but with morbid ruminations; may be classified as minimal risk based on the severity of the depressive symptoms  Follow-up Information    [facility], [facility] Follow up on 09/19/2018.   Why:  Screening for possible admission on Thursday, 09/19/18 at 7:45AM. Please bring: photo ID/proof of [employer], 14 day supply of medications provided by hospital, and clothing. Thank you.  Contact information: [address] [place] [address] [phone]        [facility] Follow up.   Specialty:  Behavioral Health Why:  Please walk in within 3 days of rehab discharge to be assessed for outpatient mental health services including medication management  and therapy. Walk in hours: Mon-Fri 8am-10am. Thank you.  Contact information: [address] [place] [address] [phone]         Subjective Data: Zyon Rosser is a 27 y/o M with history of MDD, borderline personality disorder, and alcohol use disorder who [facility] with worsening depression, SI without plan, medication non-adherence, and worsening use of alcohol. Pt was medically cleared and then transferred to [facility] for additional treatment and stabilization. He was started on alcohol withdrawal protocol. He was also restarted on previous psychotropic medications of seroquel, remeron, and vistaril, and doses have been titrated up during his stay. Pt was been working with SW team in arranging for referral to substance use treatment, and pt was accepted for a [facility] screening appointment on AM of 9/26. Pt has been reporting incremental improvement of his presenting symptoms.  Today upon evaluation, pt shares, "I'm doing pretty good - much better. I'm still having a little agitation some times." Pt reports that overall he is feeling much better in terms of his mood. He denies physical complaints. He is sleeping well. His appetite is good. He denies SI/HI/AH/VH. He is tolerating his medications well. He is still in agreement to discharge in the AM directly to [facility] for intake screening. We discussed continuing his medications as currently ordered with exception of adding small dose of seroquel in the afternoon, and pt was in agreement. He was able to engage in safety planning including plan to return to [facility] or contact emergency services if he feels unable to maintain his own safety or the safety of others. Pt had no further questions, comments, or concerns.   Plan Of Care/Follow-up recommendations:   - DC to outpatient level of care in AM (pt will go directly [facility] for intake screening).  -MDD, recurrent, severe     - Continue Seroquel 200 mg po Q hs.      -Change Seroquel 50 mg po daily for agitation to seroquel 50mg  po BID (at 0800 and 1400)     - Continue Mirtazapine 15 mg po Q hs.  Insomnia.     - Changes Trazodone 100  mg po qhs  GERD.    - Continue Protonix 40 mg po daily.  Activity:  as tolerated Diet:  normal Tests:  NA Other:  see above for DC plan  Micheal Likens, MD 09/18/2018, 3:16 PM

## 2018-09-18 NOTE — Plan of Care (Signed)
D: Patient presents irritable, depressed. He approached RN and stated "I am agitated as hell." He reports being in withdrawal from alcohol and is shaky and sweating, however, no tremor or diaphoresis was observed. He denies pain or other physical complaints. He is restless. Patient scored 3 and 8 on CIWA in morning and afternoon, respectively. Potassium was noted to be low on admission and provider Dr. [place]e [place] notified. Patient denies SI/HI/AVH.  A: Administered potassium 20mEq. Patient checked q15 min, and checks reviewed. Reviewed medication changes with patient and educated on side effects. Educated patient on importance of attending group therapy sessions and educated on several coping skills. Encouarged participation in milieu through recreation therapy and attending meals with peers. Support and encouragement provided. Fluids offered. R: Patient receptive to education on medications, and is medication compliant. Patient contracts for safety on the unit. Patient refused to fill out self inventory.

## 2018-09-18 NOTE — Progress Notes (Signed)
  [facility] :  Will you be returning to the same living situation after discharge:  No. [facility].  At discharge, do you have transportation home?: Yes,  pt's girlfriend will pick him up between 7am-7:15am on Thursday, 9/26 to [facility] Do you have the ability to pay for your medications: Yes,  mental health  Release of information consent forms completed and submitted to medical records by CSW.   Patient to Follow up at: Follow-up Information    [facility] Follow up on 09/19/2018.   Why:  Screening for possible admission on Thursday, 09/19/18 at 7:45AM. Please [place]: [place], 14 day supply of medications provided by hospital, and clothing. Thank you.  Contact information: [address] [place] [id number] [phone]        [facility] Follow up.   Specialty:  Behavioral Health Why:  Please walk in within 3 days of rehab discharge to be assessed for outpatient mental health services including medication management and therapy. Walk in hours: Mon-Fri 8am-10am. Thank you.  Contact information: [address] [place] [address] [phone]           Next level of care provider has access to [facility] Link:no  Safety Planning and Suicide Prevention discussed: Yes,  SPE completed with pt's mother and with pt. SPI pamphlet and Mobile Crisis information provided.     Has patient been referred to the Quitline?: Patient refused referral  Patient has been referred for addiction treatment: Yes  Rona RavensHeather S Shelby Anderle, LCSW 09/18/2018, 2:04 PM

## 2018-09-18 NOTE — [facility] Group Notes (Signed)
[facility] Group Notes:  (Nursing/MHT/Case Management/Adjunct)  Date:  09/18/2018  Time:  5:40 PM   Type of Therapy:  Psychoeducational Skills  Participation Level:  Did Not Attend  Participation Quality:  DID NOT ATTEND  Affect:  DID NOT ATTEND  Cognitive:  DID NOT ATTEND  Insight:  None  Engagement in Group:  DID NOT ATTEND  Modes of Intervention:  DID NOT ATTEND  Summary of Progress/Problems: Pt did not attend patient self inventory group.   Bethann Punches 09/18/2018, 5:40 PM

## 2018-09-18 NOTE — Discharge Summary (Addendum)
Physician Discharge Summary Note  Patient:  Carlos French is an 27 y.o., male  MRN:  [id number]  DOB:  1991-02-15  Patient phone:  [phone] (home)   Patient address:   [address] [place] [address],   Total Time spent with patient: Greater than 30 minutes  Date of Admission:  09/15/2018  Date of Discharge: 09/18/18  Reason for Admission: Worsening depression, SI without plan, medication non-adherence, and worsening use of alcohol.   Principal Problem: MDD (major depressive disorder), recurrent severe, without psychosis [facility])  Discharge Diagnoses: Patient Active Problem List   Diagnosis Date Noted  . PTSD (post-traumatic stress disorder) [F43.10] 09/16/2018  . MDD (major depressive disorder), recurrent severe, without psychosis (HCC) [F33.2] 11/15/2017  . MDD (major depressive disorder), severe (HCC) [F32.2] 11/03/2017  . Depression [F32.9]   . Borderline personality disorder (HCC) [F60.3] 02/10/2016  . Substance induced mood disorder (HCC) [F19.94] 02/09/2016  . Alcohol withdrawal (HCC) [F10.239] 08/23/2015  . Tobacco use disorder [F17.200] 08/23/2015  . Cocaine use disorder, severe, dependence (HCC) [F14.20] 08/11/2015  . Alcohol use disorder, severe, dependence (HCC) [F10.20] 08/11/2015  . Cannabis use disorder, severe, dependence (HCC) [F12.20] 08/11/2015   Past Psychiatric History: Anxiety, Depression, PTSD, Bipolar, Alcohol abuse, cocaine abuse, SUD  Past Medical History:  Past Medical History:  Diagnosis Date  . Allergy   . Anxiety   . Bipolar 1 disorder (HCC)   . Depression   . GERD (gastroesophageal reflux disease)   . GI (gastrointestinal bleed)   . GI bleeding   . PTSD (post-traumatic stress disorder)   . Suicidal ideation     Past Surgical History:  Procedure Laterality Date  . NO PAST SURGERIES     Family History:  Family History  Problem Relation Age of Onset  . Stroke Mother    Family Psychiatric  History: Mom- PTSD,  Depression, anxiety  Social History:  Social History   Substance and Sexual Activity  Alcohol Use Yes  . Alcohol/week: 84.0 standard drinks  . Types: 84 Cans of beer per week   Comment: Daily     Social History   Substance and Sexual Activity  Drug Use No  . Types: Marijuana, Cocaine    Social History   Socioeconomic History  . Marital status: Single    Spouse name: Not on file  . Number of children: Not on file  . Years of education: Not on file  . Highest education level: Not on file  Occupational History  . Not on file  Social Needs  . Financial resource strain: Not on file  . Food insecurity:    Worry: Not on file    Inability: Not on file  . Transportation needs:    Medical: Not on file    Non-medical: Not on file  Tobacco Use  . Smoking status: Current Every Day Smoker    Packs/day: 1.00    Types: Cigarettes  . Smokeless tobacco: Former Engineer, water and Sexual Activity  . Alcohol use: Yes    Alcohol/week: 84.0 standard drinks    Types: 84 Cans of beer per week    Comment: Daily  . Drug use: No    Types: Marijuana, Cocaine  . Sexual activity: Not on file  Lifestyle  . Physical activity:    Days per week: Not on file    Minutes per session: Not on file  . Stress: Not on file  Relationships  . Social connections:    Talks on phone:  Not on file    Gets together: Not on file    Attends religious service: Not on file    Active member of club or organization: Not on file    Attends meetings of clubs or organizations: Not on file    Relationship status: Not on file  Other Topics Concern  . Not on file  Social History Narrative  . Not on file   Hospital Course: (Per Md's discharge SRA): Onix Jumper is a 27 y/o M with history of MDD, borderline personality disorder, and alcohol use disorder who [facility] with worsening depression, SI without plan, medication non-adherence, and worsening use of alcohol. Pt was medically  cleared and then transferred to [facility] for additional treatment and stabilization. He was started on alcohol withdrawal protocol. He was also restarted on previous psychotropic medications of seroquel, remeron, and vistaril, and doses have been titrated up during his stay. Pt was been working with SW team in arranging for referral to substance use treatment, and pt was accepted for a [facility] screening appointment on AM of 9/26. Pt has been reporting incremental improvement of his presenting symptoms.  Today upon his discharge evaluation, pt shares, "I'm doing pretty good - much better. I'm still having a little agitation some times." Pt reports that overall he is feeling much better in terms of his mood. He denies physical complaints. He is sleeping well. His appetite is good. He denies SI/HI/AH/VH. He is tolerating his medications well. He is still in agreement to discharge in the AM directly to [facility] for intake screening. We discussed continuing his medications as currently ordered with exception of adding small dose of seroquel in the afternoon, and pt was in agreement. He was able to engage in safety planning including plan to return to [facility] or contact emergency services if he feels unable to maintain his own safety or the safety of others. Pt had no further questions, comments, or concerns.  Plan Of Care/Follow-up recommendations:   - DC to outpatient level of care in AM (pt will go directly [facility] for intake screening).  -MDD, recurrent, severe - Continue Seroquel 200 mg po Q hs. -Continue Seroquel 50 mg Seroquel 50mg  po BID (at 0800 and 1400) - Continue Mirtazapine 15 mg po Q hs.  Insomnia. - Changes Trazodone 100 mg po qhs  GERD. - Continue Protonix 40 mg po daily.  Activity:  as tolerated Diet:  normal Tests:  NA Other:  see above for DC plan  Physical Findings: AIMS: Facial and Oral Movements Muscles of Facial Expression: None, normal Lips and Perioral  Area: None, normal Jaw: None, normal Tongue: None, normal,Extremity Movements Upper (arms, wrists, hands, fingers): None, normal Lower (legs, knees, ankles, toes): None, normal, Trunk Movements Neck, shoulders, hips: None, normal, Overall Severity Severity of abnormal movements (highest score from questions above): None, normal Incapacitation due to abnormal movements: None, normal Patient's awareness of abnormal movements (rate only patient's report): No Awareness, Dental Status Current problems with teeth and/or dentures?: No Does patient usually wear dentures?: No  CIWA:  CIWA-Ar Total: 8 COWS:  COWS Total Score: 2  Musculoskeletal: Strength & Muscle Tone: within normal limits Gait & Station: normal Patient leans: N/A  Psychiatric Specialty Exam: Physical Exam  Nursing note and vitals reviewed. Constitutional: He is oriented to person, place, and time. He appears well-developed and well-nourished.  HENT:  Head: Normocephalic.  Eyes: Pupils are equal, round, and reactive to light.  Neck: Normal range of motion.  Cardiovascular: Normal  rate.  Respiratory: Effort normal.  GI: Soft.  Genitourinary:  Genitourinary Comments: Deferred  Musculoskeletal: Normal range of motion.  Neurological: He is alert and oriented to person, place, and time.  Skin: Skin is warm.    Review of Systems  Constitutional: Negative.   HENT: Negative.   Eyes: Negative.   Respiratory: Negative.   Cardiovascular: Negative.   Gastrointestinal: Negative.   Genitourinary: Negative.   Musculoskeletal: Negative.   Skin: Negative.   Neurological: Negative.   Endo/Heme/Allergies: Negative.   Psychiatric/Behavioral: Positive for depression (Stable), hallucinations (Hx. psychosis) and substance abuse (Hx. polysubstance use disorder). Negative for memory loss and suicidal ideas. The patient has insomnia (Stable). The patient is not nervous/anxious.     Blood pressure 112/77, pulse 61, temperature 98 F  (36.7 C), resp. rate 16, height 5' 5.98" (1.676 m), weight 49 kg, SpO2 95 %.Body mass index is 17.44 kg/m.  See Md's SRA   Has this patient used any form of tobacco in the last 30 days? (Cigarettes, Smokeless Tobacco, Cigars, and/or Pipes): Yes, an FDA-approved tobacco cessation medication was offered at discharge.  Blood Alcohol level:  Lab Results  Component Value Date   ETH 224 (H) 09/15/2018   ETH <10 05/01/2018   Metabolic Disorder Labs:  Lab Results  Component Value Date   HGBA1C 4.6 02/10/2016   Lab Results  Component Value Date   PROLACTIN 32.3 (H) 02/10/2016   Lab Results  Component Value Date   CHOL 209 (H) 02/10/2016   TRIG 120 02/10/2016   HDL 86 02/10/2016   CHOLHDL 2.4 02/10/2016   VLDL 24 02/10/2016   LDLCALC 99 02/10/2016   LDLCALC 100 (H) 08/12/2015   See Psychiatric Specialty Exam and Suicide Risk Assessment completed by Attending Physician prior to discharge.  Discharge destination:  Home  Is patient on multiple antipsychotic therapies at discharge:  No   Has Patient had three or more failed trials of antipsychotic monotherapy by history:  No  Recommended Plan for Multiple Antipsychotic Therapies: NA  Allergies as of 09/18/2018      Reactions   Acetaminophen Other (See Comments)   CANNOT TAKE (causes stomach bleeding)   Ibuprofen    History of GI bleed      Medication List    TAKE these medications     Indication  hydrOXYzine 25 MG tablet Commonly known as:  ATARAX/VISTARIL Take 1 tablet (25 mg total) by mouth every 6 (six) hours as needed for anxiety.  Indication:  Feeling Anxious   mirtazapine 15 MG tablet Commonly known as:  REMERON Take 1 tablet (15 mg total) by mouth at bedtime. For depression/insomnia  Indication:  Major Depressive Disorder, Insomnia   nicotine polacrilex 4 MG gum Commonly known as:  NICORETTE Take 1 each (4 mg total) by mouth as needed for smoking cessation. (May buy from over the counter): For smoking  cessation  Indication:  Nicotine Addiction   pantoprazole 40 MG tablet Commonly known as:  PROTONIX Take 1 tablet (40 mg total) by mouth daily. For acid reflux Start taking on:  09/19/2018  Indication:  Gastroesophageal Reflux Disease   QUEtiapine 200 MG tablet Commonly known as:  SEROQUEL Take 1 tablet (200 mg total) by mouth at bedtime. For mood control  Indication:  Agitation, Mood control   QUEtiapine 50 MG tablet Commonly known as:  SEROQUEL Take 1 tablet (50 mg total) by mouth 2 (two) times daily. For agitation Start taking on:  09/19/2018  Indication:  Agitation   traZODone 100  MG tablet Commonly known as:  DESYREL Take 1 tablet (100 mg total) by mouth at bedtime. For sleep  Indication:  Trouble Sleeping      Follow-up Information    [facility] Follow up on 09/19/2018.   Why:  Screening for possible admission on Thursday, 09/19/18 at 7:45AM. Please bring: photo ID/proof of [employer], 14 day supply of medications provided by hospital, and clothing. Thank you.  Contact information: [address] [place] [address] [phone]        [facility] Follow up.   Specialty:  Behavioral Health Why:  Please walk in within 3 days of rehab discharge to be assessed for outpatient mental health services including medication management and therapy. Walk in hours: Mon-Fri 8am-10am. Thank you.  Contact [address] [place] [address] [phone]          Follow-up recommendations: Activity:  As tolerated Diet: As recommended by your primary care doctor. Keep all scheduled follow-up appointments as recommended.   Comments: Patient is instructed prior to discharge to: Take all medications as prescribed by his/her mental healthcare provider. Report any adverse effects and or reactions from the medicines to his/her outpatient provider promptly. Patient has been instructed & cautioned: To not engage in alcohol and or illegal  drug use while on prescription medicines. In the event of worsening symptoms, patient is instructed to call the crisis hotline, 911 and or go to the nearest ED for appropriate evaluation and treatment of symptoms. To follow-up with his/her primary care provider for your other medical issues, concerns and or health care needs.    Signed: Armandina Stammer, NP, PMHNP, FNP-BC 09/18/2018, 3:19 PM   Patient seen, Suicide Assessment Completed.  Disposition Plan Reviewed

## 2018-09-18 NOTE — [facility] Group Notes (Signed)
LCSW Group Therapy Note 09/18/2018 1:38 PM  Type of Therapy and Topic: Group Therapy: Avoiding Self-Sabotaging and Enabling Behaviors  Participation Level: Active  Description of Group:  In this group, patients will learn how to identify obstacles, self-sabotaging and enabling behaviors, as well as: what are they, why do we do them and what needs these behaviors meet. Discuss unhealthy relationships and how to have positive healthy boundaries with those that sabotage and enable. Explore aspects of self-sabotage and enabling in yourself and how to limit these self-destructive behaviors in everyday life.  Therapeutic Goals: 1. Patient will identify one obstacle that relates to self-sabotage and enabling behaviors 2. Patient will identify one personal self-sabotaging or enabling behavior they did prior to admission 3. Patient will state a plan to change the above identified behavior 4. Patient will demonstrate ability to communicate their needs through discussion and/or role play.   Summary of Patient Progress:  Carlos French was pleasant and cooperative with providing information. Carlos French reports that he he struggles with using alcohol. Carlos French reports that although excessive alcohol use is main self sabotaging behavior, he reports that he also struggles with procrastination and staying around a negative environment.     Therapeutic Modalities:  Cognitive Behavioral Therapy Person-Centered Therapy Motivational Interviewing   Baldo Daub LCSWA Clinical Social Worker

## 2018-09-19 NOTE — Progress Notes (Signed)
Pt. Discharged per MD orders;  PT. Currently denies any HI/SI or AVH.  Pt. Was given education regarding follow up appointments and medications by RN.  Pt. Denies any questions or concerns about the medications.  Pt. Was escorted to the search room to retrieve his belongings by RN before being discharged to the hospital lobby.  

## 2018-09-19 NOTE — Progress Notes (Signed)
D: Patient denies SI, HI or AVH this evening. Patient presents as anxious but pleasant and cooperative.  Pt. Speaks about his discharge to [facility] in the morning and that it is his first time going there.  Pt. States he is otherwise feeling better but is nervous about his elevated liver enzymes which we spoke about, decreasing his anxiety and creating an opportunity to make a plan for follow up.  Pt. Reports that his appetite and sleep are good and he had no other complaints.  Pt. Is visualized in the dayroom interacting with staff and others.  A: Patient given emotional support from RN. Patient encouraged to come to staff with concerns and/or questions. Patient's medication routine continued. Patient's orders and plan of care reviewed.   R: Patient remains appropriate and cooperative. Will continue to monitor patient q15 minutes for safety.

## 2018-09-26 ENCOUNTER — Encounter (HOSPITAL_COMMUNITY): Payer: Self-pay | Admitting: Emergency Medicine

## 2018-09-26 ENCOUNTER — Emergency Department (HOSPITAL_COMMUNITY)
Admission: EM | Admit: 2018-09-26 | Discharge: 2018-09-29 | Disposition: A | Discharge status: Other Acute Inpatient Hospital | Payer: Medicaid Other | Attending: Emergency Medicine | Admitting: Emergency Medicine

## 2018-09-26 DIAGNOSIS — E876 Hypokalemia: Secondary | ICD-10-CM | POA: Insufficient documentation

## 2018-09-26 DIAGNOSIS — F10920 Alcohol use, unspecified with intoxication, uncomplicated: Secondary | ICD-10-CM

## 2018-09-26 DIAGNOSIS — R45851 Suicidal ideations: Secondary | ICD-10-CM

## 2018-09-26 DIAGNOSIS — F332 Major depressive disorder, recurrent severe without psychotic features: Secondary | ICD-10-CM | POA: Insufficient documentation

## 2018-09-26 DIAGNOSIS — F1020 Alcohol dependence, uncomplicated: Secondary | ICD-10-CM | POA: Insufficient documentation

## 2018-09-26 DIAGNOSIS — R443 Hallucinations, unspecified: Secondary | ICD-10-CM

## 2018-09-26 DIAGNOSIS — F1220 Cannabis dependence, uncomplicated: Secondary | ICD-10-CM | POA: Insufficient documentation

## 2018-09-26 DIAGNOSIS — F17210 Nicotine dependence, cigarettes, uncomplicated: Secondary | ICD-10-CM | POA: Insufficient documentation

## 2018-09-26 DIAGNOSIS — Z79899 Other long term (current) drug therapy: Secondary | ICD-10-CM | POA: Insufficient documentation

## 2018-09-26 LAB — CBC
HEMATOCRIT: 44.1 % (ref 39.0–52.0)
HEMOGLOBIN: 15 g/dL (ref 13.0–17.0)
MCH: 32.2 pg (ref 26.0–34.0)
MCHC: 34 g/dL (ref 30.0–36.0)
MCV: 94.6 fL (ref 78.0–100.0)
Platelets: 359 10*3/uL (ref 150–400)
RBC: 4.66 MIL/uL (ref 4.22–5.81)
RDW: 13.4 % (ref 11.5–15.5)
WBC: 7.6 10*3/uL (ref 4.0–10.5)

## 2018-09-26 LAB — COMPREHENSIVE METABOLIC PANEL
ALBUMIN: 4.5 g/dL (ref 3.5–5.0)
ALT: 120 U/L — ABNORMAL HIGH (ref 0–44)
ANION GAP: 12 (ref 5–15)
AST: 51 U/L — AB (ref 15–41)
Alkaline Phosphatase: 61 U/L (ref 38–126)
BUN: 12 mg/dL (ref 6–20)
CO2: 25 mmol/L (ref 22–32)
Calcium: 9.3 mg/dL (ref 8.9–10.3)
Chloride: 100 mmol/L (ref 98–111)
Creatinine, Ser: 1.03 mg/dL (ref 0.61–1.24)
GFR calc Af Amer: >60 mL/min (ref >60)
GFR calc non Af Amer: >60 mL/min (ref >60)
GLUCOSE: 89 mg/dL (ref 70–99)
POTASSIUM: 3.4 mmol/L — AB (ref 3.5–5.1)
SODIUM: 137 mmol/L (ref 135–145)
Total Bilirubin: 0.5 mg/dL (ref 0.3–1.2)
Total Protein: 7.7 g/dL (ref 6.5–8.1)

## 2018-09-26 LAB — ACETAMINOPHEN LEVEL: Acetaminophen (Tylenol), Serum: <10 ug/mL — ABNORMAL LOW (ref 10–30)

## 2018-09-26 LAB — RAPID URINE DRUG SCREEN, HOSP PERFORMED
AMPHETAMINES: NOT DETECTED
BARBITURATES: NOT DETECTED
BENZODIAZEPINES: NOT DETECTED
Cocaine: NOT DETECTED
Opiates: NOT DETECTED
Tetrahydrocannabinol: POSITIVE — AB

## 2018-09-26 LAB — ETHANOL: Alcohol, Ethyl (B): 154 mg/dL — ABNORMAL HIGH (ref <10)

## 2018-09-26 LAB — SALICYLATE LEVEL: Salicylate Lvl: <7 mg/dL (ref 2.8–30.0)

## 2018-09-26 MED ORDER — DIPHENHYDRAMINE HCL 50 MG/ML IJ SOLN
25.0000 mg | Freq: Once | INTRAMUSCULAR | Status: AC
  Administered 2018-09-26: 25 mg via INTRAMUSCULAR
  Filled 2018-09-26: qty 1

## 2018-09-26 MED ORDER — THIAMINE HCL 100 MG/ML IJ SOLN: 100.0000 mg | Freq: Every day | INTRAMUSCULAR | Status: DC

## 2018-09-26 MED ORDER — LORAZEPAM 1 MG PO TABS
0.0000 mg | ORAL_TABLET | Freq: Two times a day (BID) | ORAL | Status: DC
  Administered 2018-09-29: 1 mg via ORAL
  Filled 2018-09-26: qty 1

## 2018-09-26 MED ORDER — POTASSIUM CHLORIDE CRYS ER 20 MEQ PO TBCR
40.0000 meq | EXTENDED_RELEASE_TABLET | Freq: Once | ORAL | Status: AC
  Administered 2018-09-26: 40 meq via ORAL
  Filled 2018-09-26: qty 2

## 2018-09-26 MED ORDER — VITAMIN B-1 100 MG PO TABS
100.0000 mg | ORAL_TABLET | Freq: Every day | ORAL | Status: DC
  Administered 2018-09-28 – 2018-09-29 (×2): 100 mg via ORAL
  Filled 2018-09-26 (×2): qty 1

## 2018-09-26 MED ORDER — LORAZEPAM 2 MG/ML IJ SOLN: 0.0000 mg | Freq: Two times a day (BID) | INTRAMUSCULAR | Status: DC

## 2018-09-26 MED ORDER — LORAZEPAM 1 MG PO TABS
0.0000 mg | ORAL_TABLET | Freq: Four times a day (QID) | ORAL | Status: AC
  Administered 2018-09-27 – 2018-09-28 (×4): 1 mg via ORAL
  Filled 2018-09-26 (×4): qty 1

## 2018-09-26 MED ORDER — METOCLOPRAMIDE HCL 5 MG/ML IJ SOLN
10.0000 mg | Freq: Once | INTRAMUSCULAR | Status: AC
  Administered 2018-09-26: 10 mg via INTRAMUSCULAR
  Filled 2018-09-26: qty 2

## 2018-09-26 MED ORDER — LORAZEPAM 2 MG/ML IJ SOLN: 0.0000 mg | Freq: Four times a day (QID) | INTRAMUSCULAR | Status: AC

## 2018-09-26 NOTE — ED Provider Notes (Signed)
[facility] EMERGENCY DEPARTMENT Provider Note   CSN: [id number] Arrival date & time: 09/26/18  2036     History   Chief Complaint Chief Complaint  Patient presents with  . Suicidal    HPI Carlos French is a 27 y.o. male.   Carlos French is a 27 y.o. Male with history of bipolar disorder, PTSD, GERD and GI bleeding, who presents to the emergency department for evaluation of suicidal ideations and hallucinations.  Patient presents accompanied by [facility] under IVC from family member.  Patient reports that he has had increasing thoughts of suicide recently and was planning to jump out of the car this evening but his girlfriend talked him out of it.  He reports he still feels suicidal, denies homicidal ideations.  He reports auditory and visual hallucinations and that the auditory hallucinations are telling him to kill himself he also reports having very vivid dreams of his death.  He reports he is tired of feeling this way and has given up and does not want to live anymore.  He does endorse using alcohol earlier tonight and reports frequent heavy drinking, he also endorses marijuana use but denies any other substance use.  He denies any focal medical complaints today aside from a headache which he attributes to his alcohol use, reports he is unable to take Tylenol or ibuprofen for this.  Patient denies any fevers or recent illnesses, no chest pain, shortness of breath or abdominal pain.     Past Medical History:  Diagnosis Date  . Allergy   . Anxiety   . Bipolar 1 disorder (HCC)   . Depression   . GERD (gastroesophageal reflux disease)   . GI (gastrointestinal bleed)   . GI bleeding   . PTSD (post-traumatic stress disorder)   . Suicidal ideation     Patient Active Problem List   Diagnosis Date Noted  . PTSD (post-traumatic stress disorder) 09/16/2018  . MDD (major depressive disorder), recurrent severe, without psychosis (HCC) 11/15/2017  . MDD (major  depressive disorder), severe (HCC) 11/03/2017  . Depression   . Borderline personality disorder (HCC) 02/10/2016  . Substance induced mood disorder (HCC) 02/09/2016  . Alcohol withdrawal (HCC) 08/23/2015  . Tobacco use disorder 08/23/2015  . Cocaine use disorder, severe, dependence (HCC) 08/11/2015  . Alcohol use disorder, severe, dependence (HCC) 08/11/2015  . Cannabis use disorder, severe, dependence (HCC) 08/11/2015    Past Surgical History:  Procedure Laterality Date  . NO PAST SURGERIES          Home Medications    Prior to Admission medications   Medication Sig Start Date End Date Taking? Authorizing Provider  hydrOXYzine (ATARAX/VISTARIL) 25 MG tablet Take 1 tablet (25 mg total) by mouth every 6 (six) hours as needed for anxiety. 09/18/18   Armandina Stammer I, NP  mirtazapine (REMERON) 15 MG tablet Take 1 tablet (15 mg total) by mouth at bedtime. For depression/insomnia 09/18/18   Armandina Stammer I, NP  nicotine polacrilex (NICORETTE) 4 MG gum Take 1 each (4 mg total) by mouth as needed for smoking cessation. (May buy from over the counter): For smoking cessation 09/18/18   Armandina Stammer I, NP  pantoprazole (PROTONIX) 40 MG tablet Take 1 tablet (40 mg total) by mouth daily. For acid reflux 09/19/18   Armandina Stammer I, NP  QUEtiapine (SEROQUEL) 200 MG tablet Take 1 tablet (200 mg total) by mouth at bedtime. For mood control 09/18/18   Armandina Stammer I, NP  QUEtiapine (SEROQUEL)  50 MG tablet Take 1 tablet (50 mg total) by mouth 2 (two) times daily. For agitation 09/19/18   Armandina Stammer I, NP  traZODone (DESYREL) 100 MG tablet Take 1 tablet (100 mg total) by mouth at bedtime. For sleep 09/18/18   Sanjuana Kava, NP    Family History Family History  Problem Relation Age of Onset  . Stroke Mother     Social History Social History   Tobacco Use  . Smoking status: Current Every Day Smoker    Packs/day: 1.00    Types: Cigarettes  . Smokeless tobacco: Former Engineer, water Use Topics  .  Alcohol use: Yes    Alcohol/week: 84.0 standard drinks    Types: 84 Cans of beer per week    Comment: Daily  . Drug use: No    Types: Marijuana, Cocaine     Allergies   Acetaminophen and Ibuprofen   Review of Systems Review of Systems  Constitutional: Negative for chills and fever.  HENT: Negative.   Eyes: Negative for visual disturbance.  Respiratory: Negative for cough and shortness of breath.   Cardiovascular: Negative for chest pain.  Gastrointestinal: Negative for abdominal pain, nausea and vomiting.  Genitourinary: Negative for dysuria.  Musculoskeletal: Negative for arthralgias and myalgias.  Skin: Negative for color change and rash.  Neurological: Positive for headaches. Negative for dizziness, seizures, speech difficulty, weakness and numbness.  Psychiatric/Behavioral: Positive for dysphoric mood, hallucinations and suicidal ideas.     Physical Exam Updated Vital Signs BP 128/86 (BP Location: Right Arm)   Pulse 98   Temp 99.1 F (37.3 C) (Oral)   Resp 18   Ht 5\' 6"  (1.676 m)   SpO2 97%   BMI 17.43 kg/m    Physical Exam  Constitutional: He appears well-developed and well-nourished. No distress.  HENT:  Head: Normocephalic and atraumatic.  Mouth/Throat: Oropharynx is clear and moist.  Eyes: Pupils are equal, round, and reactive to light. EOM are normal. Right eye exhibits no discharge. Left eye exhibits no discharge.  Neck: Neck supple.  No rigidity  Cardiovascular: Normal rate, regular rhythm, normal heart sounds and intact distal pulses.  Pulmonary/Chest: Effort normal and breath sounds normal. No respiratory distress. He has no wheezes. He has no rales.  Respirations equal and unlabored, patient able to speak in full sentences, lungs clear to auscultation bilaterally  Abdominal: Soft. Bowel sounds are normal. He exhibits no distension and no mass. There is no tenderness. There is no guarding.  Abdomen soft, nondistended, nontender to palpation in all  quadrants without guarding or peritoneal signs  Musculoskeletal: He exhibits no edema or deformity.  Neurological: He is alert. Coordination normal.  Speech is clear, able to follow commands CN III-XII intact Normal strength in upper and lower extremities bilaterally including dorsiflexion and plantar flexion, strong and equal grip strength Sensation normal to light and sharp touch Moves extremities without ataxia, coordination intact  Skin: Skin is warm and dry. Capillary refill takes less than 2 seconds. He is not diaphoretic.  Psychiatric: He has a normal mood and affect. His behavior is normal.  Nursing note and vitals reviewed.    ED Treatments / Results  Labs (all labs ordered are listed, but only abnormal results are displayed) Labs Reviewed  COMPREHENSIVE METABOLIC PANEL - Abnormal; Notable for the following components:      Result Value   Potassium 3.4 (*)    AST 51 (*)    ALT 120 (*)    All other components within normal  limits  ETHANOL - Abnormal; Notable for the following components:   Alcohol, Ethyl (B) 154 (*)    All other components within normal limits  ACETAMINOPHEN LEVEL - Abnormal; Notable for the following components:   Acetaminophen (Tylenol), Serum <10 (*)    All other components within normal limits  RAPID URINE DRUG SCREEN, HOSP PERFORMED - Abnormal; Notable for the following components:   Tetrahydrocannabinol POSITIVE (*)    All other components within normal limits  SALICYLATE LEVEL  CBC    EKG None  Radiology No results found.  Procedures Procedures (including critical care time)  Medications Ordered in ED Medications  LORazepam (ATIVAN) injection 0-4 mg (0 mg Intravenous Not Given 09/27/18 0525)    Or  LORazepam (ATIVAN) tablet 0-4 mg ( Oral See Alternative 09/27/18 0525)  LORazepam (ATIVAN) injection 0-4 mg (has no administration in time range)    Or  LORazepam (ATIVAN) tablet 0-4 mg (has no administration in time range)  thiamine  (VITAMIN B-1) tablet 100 mg (has no administration in time range)    Or  thiamine (B-1) injection 100 mg (has no administration in time range)  ondansetron (ZOFRAN) tablet 4 mg (has no administration in time range)  diphenhydrAMINE (BENADRYL) injection 25 mg (25 mg Intramuscular Given 09/26/18 2215)  metoCLOPramide (REGLAN) injection 10 mg (10 mg Intramuscular Given 09/26/18 2215)  potassium chloride SA (K-DUR,KLOR-CON) CR tablet 40 mEq (40 mEq Oral Given 09/26/18 2354)     Initial Impression / Assessment and Plan / ED Course  I have reviewed the triage vital signs and the nursing notes.  Pertinent labs & imaging results that were available during my care of the patient were reviewed by me and considered in my medical decision making (see chart for details).  Patient presents for suicidal ideations as well as hallucinations, history of the same.  Reports compliance with his medications and yet worsening of the psychiatric symptoms.  Patient also complaining of headache which he attributes to alcohol use reports he has been drinking heavily over the past several days.  Normal neurologic exam, no concern for meningitis, story and exam not consistent with SAH or ICH.  Treated with Reglan and Benadryl IM with improvement in symptoms.  No other focal medical symptoms and vital stable here in the emergency department.  Feel patient would benefit from psychiatric evaluation, get medical screening labs and TTS consult has been placed.  Labs are overall reassuring, no leukocytosis, normal hemoglobin, mild hypokalemia of 3.4, no other acute electrolyte derangements, normal renal function, AST and ALT are somewhat elevated but this is improved from when patient was evaluated 11 days ago, acetaminophen and salicylate levels are negative, ethanol level is 154, UDS positive for THC.  At this time patient is medically cleared for psychiatric evaluation, he has been placed under ED psych hold and CIWA protocol is in  place.  TTS has evaluated patient and recommends overnight observation with reevaluation by psychiatry in the morning.  Final Clinical Impressions(s) / ED Diagnoses   Final diagnoses:  Suicidal ideation  Hallucinations  Alcoholic intoxication without complication [facility])  Hypokalemia    ED Discharge Orders    None      Dartha Lodge, [place] 09/27/18 1610  Doug Sou, MD 09/27/18 1453
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
## 2018-09-26 NOTE — ED Notes (Addendum)
Security wanded by pt. at triage .

## 2018-09-26 NOTE — ED Notes (Signed)
Patient belongings inventoried and placed in locker #6; patient has 1 teal T-shirt, 1 pair of boxers, 1 pair of grey jogger pant, and one pair of sneakers-Monique,RN

## 2018-09-26 NOTE — ED Triage Notes (Signed)
Patient arrived with sheriff officer ( IVC) reports suicidal ideation with visual/auditory hallucinations , plans to jump in front of cars , history of depression and SI .

## 2018-09-26 NOTE — ED Notes (Addendum)
Staffing office notified for pt.'s sitter , maroon paper scrubs given to pt. , security notified to wand pt.

## 2018-09-26 NOTE — ED Notes (Signed)
Pt belongings placed in Pod F locker #6.

## 2018-09-27 MED ORDER — ONDANSETRON HCL 4 MG PO TABS
4.0000 mg | ORAL_TABLET | Freq: Three times a day (TID) | ORAL | Status: DC | PRN
  Administered 2018-09-27 – 2018-09-28 (×3): 4 mg via ORAL
  Filled 2018-09-27 (×3): qty 1

## 2018-09-27 MED ORDER — PANTOPRAZOLE SODIUM 40 MG PO TBEC
40.0000 mg | DELAYED_RELEASE_TABLET | Freq: Every day | ORAL | Status: DC
  Administered 2018-09-28 – 2018-09-29 (×2): 40 mg via ORAL
  Filled 2018-09-27 (×2): qty 1

## 2018-09-27 MED ORDER — TRAZODONE HCL 100 MG PO TABS
100.0000 mg | ORAL_TABLET | Freq: Every day | ORAL | Status: DC
  Administered 2018-09-27 – 2018-09-28 (×2): 100 mg via ORAL
  Filled 2018-09-27 (×2): qty 1

## 2018-09-27 MED ORDER — QUETIAPINE FUMARATE 50 MG PO TABS
50.0000 mg | ORAL_TABLET | Freq: Two times a day (BID) | ORAL | Status: DC
  Administered 2018-09-28 – 2018-09-29 (×3): 50 mg via ORAL
  Filled 2018-09-27 (×3): qty 1

## 2018-09-27 MED ORDER — QUETIAPINE FUMARATE 200 MG PO TABS
200.0000 mg | ORAL_TABLET | Freq: Every day | ORAL | Status: DC
  Administered 2018-09-28: 200 mg via ORAL
  Filled 2018-09-27: qty 1

## 2018-09-27 MED ORDER — MIRTAZAPINE 15 MG PO TABS
15.0000 mg | ORAL_TABLET | Freq: Every day | ORAL | Status: DC
  Administered 2018-09-27 – 2018-09-28 (×2): 15 mg via ORAL
  Filled 2018-09-27 (×2): qty 1

## 2018-09-27 MED ORDER — HALOPERIDOL LACTATE 5 MG/ML IJ SOLN
5.0000 mg | Freq: Once | INTRAMUSCULAR | Status: AC
  Administered 2018-09-27: 5 mg via INTRAMUSCULAR
  Filled 2018-09-27: qty 1

## 2018-09-27 MED ORDER — HYDROXYZINE HCL 25 MG PO TABS
25.0000 mg | ORAL_TABLET | Freq: Four times a day (QID) | ORAL | Status: DC | PRN
  Administered 2018-09-28 – 2018-09-29 (×2): 25 mg via ORAL
  Filled 2018-09-27 (×3): qty 1

## 2018-09-27 NOTE — ED Notes (Signed)
ED Provider at bedside. 

## 2018-09-27 NOTE — ED Notes (Signed)
Pt given snack. 

## 2018-09-27 NOTE — ED Notes (Signed)
To pts room again for striking himself in the head. Pt states "the thoughts they won't stop". Advised pt to stop striking himself in the head and asked what he would we could do to better support him. Pt states "I don't know just make it stop". Told pt he had nighttime medications that would assist him in sleeping, states "I'm not fucking taking those they don't work".

## 2018-09-27 NOTE — ED Notes (Signed)
Pt initially refused haldol IM/. Explained to pt that he needed to receive the medication, but how he did was up to him. Informed him if he refused security would be required to assist, pt states "I don't want that" and allowed this RN to admin IM haldol w/out incident.

## 2018-09-27 NOTE — ED Provider Notes (Signed)
Patient is under involuntary psychiatric commitment.  He complains of feeling suicidal.  Plan to jump out of a car.  He admits to drinking alcohol earlier tonight.  He is presently alert Glasgow Coma Score 15.  Moves all extremities well.  Depressed affect.  First exam form filled out by me   Doug Sou, MD 09/27/18 [phone]

## 2018-09-27 NOTE — ED Notes (Signed)
Question if patient was here for detox from ETOH or SI, pt states "I really don't want to talk about it".

## 2018-09-27 NOTE — ED Notes (Signed)
On entering room to introduce self to pt, pt is tearful and frustrated. Stating "I feel like I'm going to go crazy." Offered emotional support and asked if I could support pt in any way. States "I just want to be alone". Advised pt to inform RN if he required anything.

## 2018-09-27 NOTE — BHH Counselor (Addendum)
Patient evaluated by Reola Calkins, NP and meets criteria for inpatient treatment. Patient to be referred to the [facility]. Contacted the [facility] and no beds are available. Contacted the [facility] and left a voicemail with the transfer coordinator (April Clearance Coots) #[phone] extension [phone]. Also, referred patient to the following hospital for review: [place], [facility], [facility] [address], [address], [address], [place], [place], [address], [place], [place], [address], [place], and [place].

## 2018-09-27 NOTE — ED Notes (Addendum)
First Exam has been completed by EDP and complete IVC papers has been faxed to [facility], Copy placed in medical records and original in red folder; 3 copies on chart-Monique,RN

## 2018-09-27 NOTE — ED Provider Notes (Signed)
8:18 PM Patient agitated.  He stating that his head does not feel right and he is refusing other p.o. meds.  I do not think he is withdrawing from alcohol given no obvious tremor, diaphoresis and his vital signs have recently been pretty stable.  However given his agitation and now some self injury I will give him IM Haldol.  Last 2 ECGs have been reviewed and do not show any obvious QT prolongation.   Pricilla Loveless, MD 09/27/18 2036

## 2018-09-27 NOTE — ED Notes (Signed)
Pt is punching himself in the head. Offered vistaril for agitation/anxiety, pt refused stating "that doesn't fucking working I'm not taking it. That's why I'm here". MD Criss Alvine aware pt agitation. Pt remaining lying in bed at this time, offered emotional support

## 2018-09-27 NOTE — ED Notes (Signed)
TTS at bedside. 

## 2018-09-27 NOTE — BH Assessment (Signed)
Counselor made another attempt to contact the transfer coordinator at the [facility] to complete a referral.  Left a voicemail with the transfer coordinator (April Harper) #[phone] extension [phone].

## 2018-09-27 NOTE — ED Notes (Signed)
Lunch ordered via Service Response. 

## 2018-09-27 NOTE — Progress Notes (Signed)
Patient is seen by me via tele-psych and I have consulted with Dr. Lucianne Muss.  Patient still claims that he is "pretty suicidal."  He denies any HI or hallucinations today.  Patient reports that he was discharged from [facility] and [facility], but instead he decided to have his girlfriend pick him up and he never went.  Patient stated that he stayed on his medications and is currently still taking them as prescribed.  He is blaming the medications for his behavior, however, he started drinking again 4 days ago and per the patient has been about 3 40 ounce beers a day.  Patient states that he does not want to go to a 30-day program and that he just wants his medications fixed.  However patient is questioned if he lied about being stable when he was discharged from the hospital in September and he stated that he was stable.  Patient also agreed that between his stressors at home and alcohol use has worsened his depression which is brought him back to the hospital claiming suicidal thoughts.  However at this time I cannot clear patient completely due to his continued suicidal ideations.  At this time we will say that patient meets inpatient criteria, however there is not an appropriate bed at this time at [facility].

## 2018-09-27 NOTE — ED Notes (Signed)
MD Criss Alvine at bedside for escalating agitation

## 2018-09-28 DIAGNOSIS — F4310 Post-traumatic stress disorder, unspecified: Secondary | ICD-10-CM

## 2018-09-28 DIAGNOSIS — F17210 Nicotine dependence, cigarettes, uncomplicated: Secondary | ICD-10-CM

## 2018-09-28 DIAGNOSIS — F1410 Cocaine abuse, uncomplicated: Secondary | ICD-10-CM

## 2018-09-28 DIAGNOSIS — F1210 Cannabis abuse, uncomplicated: Secondary | ICD-10-CM

## 2018-09-28 MED ORDER — NICOTINE 21 MG/24HR TD PT24
21.0000 mg | MEDICATED_PATCH | Freq: Once | TRANSDERMAL | Status: AC
  Administered 2018-09-28: 21 mg via TRANSDERMAL
  Filled 2018-09-28: qty 1

## 2018-09-28 NOTE — ED Notes (Signed)
Pt awake eating breakfast 

## 2018-09-28 NOTE — ED Notes (Addendum)
Pt up and ambulatory, denies acute complaints. Pt complains he is hot, room temperature adjusted. Shower provided w/ fresh scrubs. Bed changed performed at this time-breakfast tray ordered at this time

## 2018-09-28 NOTE — ED Notes (Signed)
Pt resting in bed sitter remains at bedside.
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
## 2018-09-28 NOTE — ED Notes (Signed)
Pt returns to room 

## 2018-09-28 NOTE — ED Notes (Signed)
Pt resting sitter  bedside.

## 2018-09-28 NOTE — ED Notes (Signed)
Pt at nurses station with sitter placing phone call.

## 2018-09-28 NOTE — Progress Notes (Signed)
CSW called and checked the referral status at the following:    [facility] - no beds available [facility] - no beds available [facility] - no answer [facility] - person who handles referrals was unavailable [facility] - waiting to review    CSW will continue to follow-up.   Roselyn Bering, MSW, LCSW Clinical Social Work

## 2018-09-28 NOTE — ED Notes (Signed)
Sitter at bedside.

## 2018-09-28 NOTE — ED Notes (Signed)
Pt having TTS consult. Sitter at bedside

## 2018-09-28 NOTE — ED Triage Notes (Signed)
Pt reported needing something for anxiety .

## 2018-09-28 NOTE — ED Notes (Signed)
Pt watching tv. Sitter at bedside.

## 2018-09-28 NOTE — ED Notes (Signed)
Pt up in bed eating lunch. Sitter remains at bedside.

## 2018-09-28 NOTE — ED Notes (Signed)
Pt remains sleeping sitter at bedside.

## 2018-09-28 NOTE — Progress Notes (Signed)
CSW faxed placement referrals to the following:  [facility]  [facility]  [facility]  [facility]  [facility]  [facility]  [facility]       CSW will follow-up.   Roselyn Bering, MSW, LCSW Clinical Social Work

## 2018-09-29 ENCOUNTER — Inpatient Hospital Stay (HOSPITAL_COMMUNITY)
Admission: AD | Admit: 2018-09-29 | Discharge: 2018-10-03 | DRG: 885 | Disposition: A | Discharge status: Home / Self Care | Payer: Federal, State, Local not specified - Other | Attending: Psychiatry | Admitting: Psychiatry

## 2018-09-29 ENCOUNTER — Other Ambulatory Visit: Payer: Self-pay

## 2018-09-29 ENCOUNTER — Encounter (HOSPITAL_COMMUNITY): Payer: Self-pay | Admitting: *Deleted

## 2018-09-29 DIAGNOSIS — F603 Borderline personality disorder: Secondary | ICD-10-CM | POA: Diagnosis present

## 2018-09-29 DIAGNOSIS — F333 Major depressive disorder, recurrent, severe with psychotic symptoms: Secondary | ICD-10-CM | POA: Diagnosis present

## 2018-09-29 DIAGNOSIS — F10239 Alcohol dependence with withdrawal, unspecified: Secondary | ICD-10-CM | POA: Diagnosis present

## 2018-09-29 DIAGNOSIS — R45851 Suicidal ideations: Secondary | ICD-10-CM | POA: Diagnosis present

## 2018-09-29 DIAGNOSIS — F429 Obsessive-compulsive disorder, unspecified: Secondary | ICD-10-CM | POA: Diagnosis present

## 2018-09-29 DIAGNOSIS — F1220 Cannabis dependence, uncomplicated: Secondary | ICD-10-CM | POA: Diagnosis present

## 2018-09-29 DIAGNOSIS — F17210 Nicotine dependence, cigarettes, uncomplicated: Secondary | ICD-10-CM | POA: Diagnosis present

## 2018-09-29 DIAGNOSIS — Z915 Personal history of self-harm: Secondary | ICD-10-CM | POA: Diagnosis not present

## 2018-09-29 DIAGNOSIS — F1020 Alcohol dependence, uncomplicated: Secondary | ICD-10-CM | POA: Diagnosis present

## 2018-09-29 DIAGNOSIS — Z79899 Other long term (current) drug therapy: Secondary | ICD-10-CM

## 2018-09-29 DIAGNOSIS — Z91410 Personal history of adult physical and sexual abuse: Secondary | ICD-10-CM | POA: Diagnosis not present

## 2018-09-29 DIAGNOSIS — F4310 Post-traumatic stress disorder, unspecified: Secondary | ICD-10-CM | POA: Diagnosis present

## 2018-09-29 DIAGNOSIS — Z886 Allergy status to analgesic agent status: Secondary | ICD-10-CM | POA: Diagnosis not present

## 2018-09-29 DIAGNOSIS — Y906 Blood alcohol level of 120-199 mg/100 ml: Secondary | ICD-10-CM | POA: Diagnosis present

## 2018-09-29 DIAGNOSIS — Z560 Unemployment, unspecified: Secondary | ICD-10-CM

## 2018-09-29 DIAGNOSIS — G4700 Insomnia, unspecified: Secondary | ICD-10-CM | POA: Diagnosis present

## 2018-09-29 DIAGNOSIS — F1420 Cocaine dependence, uncomplicated: Secondary | ICD-10-CM | POA: Diagnosis present

## 2018-09-29 MED ORDER — NICOTINE POLACRILEX 2 MG MT GUM
2.0000 mg | CHEWING_GUM | OROMUCOSAL | Status: DC | PRN
  Administered 2018-09-29 (×2): 2 mg via ORAL
  Filled 2018-09-29 (×2): qty 1

## 2018-09-29 MED ORDER — LORAZEPAM 1 MG PO TABS: 1.0000 mg | ORAL_TABLET | Freq: Every day | ORAL | Status: DC

## 2018-09-29 MED ORDER — MAGNESIUM HYDROXIDE 400 MG/5ML PO SUSP: 30.0000 mL | Freq: Every day | ORAL | Status: DC | PRN

## 2018-09-29 MED ORDER — LORAZEPAM 1 MG PO TABS
1.0000 mg | ORAL_TABLET | Freq: Three times a day (TID) | ORAL | Status: AC
  Administered 2018-10-01 – 2018-10-02 (×3): 1 mg via ORAL
  Filled 2018-09-29 (×3): qty 1

## 2018-09-29 MED ORDER — HYDROXYZINE HCL 25 MG PO TABS: 25.0000 mg | ORAL_TABLET | Freq: Three times a day (TID) | ORAL | Status: DC | PRN

## 2018-09-29 MED ORDER — ONDANSETRON 4 MG PO TBDP
4.0000 mg | ORAL_TABLET | Freq: Four times a day (QID) | ORAL | Status: AC | PRN
  Administered 2018-09-30 – 2018-10-01 (×3): 4 mg via ORAL
  Filled 2018-09-29 (×3): qty 1

## 2018-09-29 MED ORDER — THIAMINE HCL 100 MG/ML IJ SOLN: 100.0000 mg | Freq: Once | INTRAMUSCULAR | Status: DC

## 2018-09-29 MED ORDER — ALUM & MAG HYDROXIDE-SIMETH 200-200-20 MG/5ML PO SUSP
30.0000 mL | ORAL | Status: DC | PRN
  Administered 2018-10-01 – 2018-10-03 (×4): 30 mL via ORAL
  Filled 2018-09-29 (×4): qty 30

## 2018-09-29 MED ORDER — ADULT MULTIVITAMIN W/MINERALS CH
1.0000 | ORAL_TABLET | Freq: Every day | ORAL | Status: DC
  Administered 2018-09-30 – 2018-10-03 (×4): 1 via ORAL
  Filled 2018-09-29 (×6): qty 1

## 2018-09-29 MED ORDER — LORAZEPAM 1 MG PO TABS
1.0000 mg | ORAL_TABLET | Freq: Four times a day (QID) | ORAL | Status: AC
  Administered 2018-09-29 – 2018-10-01 (×6): 1 mg via ORAL
  Filled 2018-09-29 (×6): qty 1

## 2018-09-29 MED ORDER — TRAZODONE HCL 50 MG PO TABS: 50.0000 mg | ORAL_TABLET | Freq: Every evening | ORAL | Status: DC | PRN

## 2018-09-29 MED ORDER — LOPERAMIDE HCL 2 MG PO CAPS: 2.0000 mg | ORAL_CAPSULE | ORAL | Status: AC | PRN

## 2018-09-29 MED ORDER — HYDROXYZINE HCL 25 MG PO TABS
25.0000 mg | ORAL_TABLET | Freq: Four times a day (QID) | ORAL | Status: DC | PRN
  Administered 2018-09-29: 25 mg via ORAL
  Filled 2018-09-29: qty 1

## 2018-09-29 MED ORDER — LORAZEPAM 1 MG PO TABS: 1.0000 mg | ORAL_TABLET | Freq: Four times a day (QID) | ORAL | Status: AC | PRN

## 2018-09-29 MED ORDER — VITAMIN B-1 100 MG PO TABS
100.0000 mg | ORAL_TABLET | Freq: Every day | ORAL | Status: DC
  Administered 2018-09-30 – 2018-10-02 (×3): 100 mg via ORAL
  Filled 2018-09-29 (×6): qty 1

## 2018-09-29 MED ORDER — NICOTINE 21 MG/24HR TD PT24
21.0000 mg | MEDICATED_PATCH | Freq: Once | TRANSDERMAL | Status: DC
  Administered 2018-09-29: 21 mg via TRANSDERMAL
  Filled 2018-09-29: qty 1

## 2018-09-29 MED ORDER — LORAZEPAM 1 MG PO TABS
1.0000 mg | ORAL_TABLET | Freq: Two times a day (BID) | ORAL | Status: AC
  Administered 2018-10-02 – 2018-10-03 (×2): 1 mg via ORAL
  Filled 2018-09-29 (×2): qty 1

## 2018-09-29 MED ORDER — TRAZODONE HCL 50 MG PO TABS
50.0000 mg | ORAL_TABLET | Freq: Every evening | ORAL | Status: DC | PRN
  Administered 2018-09-29 (×2): 50 mg via ORAL
  Filled 2018-09-29 (×2): qty 1

## 2018-09-29 NOTE — ED Triage Notes (Signed)
Pt having verbal out burst and stating he was anxious . Pt cussing because he was tired of waiting for a room.

## 2018-09-29 NOTE — ED Triage Notes (Signed)
TC to [facility] to arrange transport to [facility] at 1915 tonight.

## 2018-09-29 NOTE — Progress Notes (Signed)
CSW contacted [facility] ED, spoke to [place], and informed that pt's admission needs to be delayed due to walk-in patients at [facility] this afternoon.  Timmothy Euler. Kaylyn Lim, MSW, LCSWA Disposition Clinical Social Work [phone] (cell) [phone] (office)
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
## 2018-09-29 NOTE — ED Notes (Signed)
Pt has male visitor. Sitter at bedside.

## 2018-09-29 NOTE — ED Notes (Signed)
TTS was completed. Pt was understanding and receptive of treatment plan.

## 2018-09-29 NOTE — Tx Team (Addendum)
Initial Treatment Plan 09/29/2018 8:54 PM Carlos French ZOX:[id number]    PATIENT STRESSORS: Financial difficulties Occupational concerns Substance abuse Other: "worried about my mom all the time.  She just recently got out of the hospital for aspiration pneumonia"   PATIENT STRENGTHS: Average or above average intelligence Communication skills General fund of knowledge Special hobby/interest Supportive family/friends   PATIENT IDENTIFIED PROBLEMS: "My meds"  "getting some outpatient treatment"    SI  Substance abuse  depression           DISCHARGE CRITERIA:  Improved stabilization in mood, thinking, and/or behavior Motivation to continue treatment in a less acute level of care Need for constant or close observation no longer present  PRELIMINARY DISCHARGE PLAN: Outpatient therapy  PATIENT/FAMILY INVOLVEMENT: This treatment plan has been presented to and reviewed with the patient, Carlos French.  The patient and family have been given the opportunity to ask questions and make suggestions.  Arrie Aran, [place] 09/29/2018, 8:54 PM

## 2018-09-29 NOTE — ED Notes (Signed)
Report has been received.  Patient is asleep.  Environmental check completed.

## 2018-09-29 NOTE — ED Notes (Signed)
Pt consumed all items on breakfast tray. Pt requested another carton of milk.

## 2018-09-29 NOTE — Progress Notes (Signed)
Pt accepted to [facility], Bed 305-1  Delma Officer, is the accepting provider.  Nehemiah Massed, MD. is the attending provider.  Call report to [phone]  Magda Paganini @ [facility] ED notified.   Pt is IVC'd Pt may be transported by [facility] Pt scheduled to arrive at [facility] @13 :30  Carney Bern T. Kaylyn Lim, MSW, LCSWA Disposition Clinical Social Work [phone] (cell) [phone] (office)

## 2018-09-29 NOTE — ED Notes (Signed)
Pt was given two warm blankets upon request.

## 2018-09-29 NOTE — BHH Counselor (Signed)
Patient re-assessed this morning. Patient expressed he continues to feel suicidal and  depressed. He stated, "I want ya'll to hurry up and find me a fucking place to go. I am getting sick and fucking tired of being in this room. If ya'll cannot find me a place to go I will just go to [place]."   Denies homicidal ideations, denies auditory / visual hallucinations.

## 2018-09-29 NOTE — ED Notes (Signed)
Breakfast tray was given.  Pt is awake and consuming meal.

## 2018-09-30 DIAGNOSIS — F1020 Alcohol dependence, uncomplicated: Secondary | ICD-10-CM

## 2018-09-30 DIAGNOSIS — F333 Major depressive disorder, recurrent, severe with psychotic symptoms: Principal | ICD-10-CM

## 2018-09-30 DIAGNOSIS — F1220 Cannabis dependence, uncomplicated: Secondary | ICD-10-CM

## 2018-09-30 DIAGNOSIS — F603 Borderline personality disorder: Secondary | ICD-10-CM

## 2018-09-30 MED ORDER — RAMELTEON 8 MG PO TABS
8.0000 mg | ORAL_TABLET | Freq: Every day | ORAL | Status: DC
  Administered 2018-09-30 – 2018-10-02 (×3): 8 mg via ORAL
  Filled 2018-09-30: qty 1
  Filled 2018-09-30: qty 7
  Filled 2018-09-30 (×3): qty 1

## 2018-09-30 MED ORDER — OLANZAPINE 10 MG PO TABS
10.0000 mg | ORAL_TABLET | Freq: Every day | ORAL | Status: DC
  Administered 2018-09-30 – 2018-10-02 (×3): 10 mg via ORAL
  Filled 2018-09-30: qty 1
  Filled 2018-09-30: qty 7
  Filled 2018-09-30 (×3): qty 1

## 2018-09-30 MED ORDER — LITHIUM CARBONATE ER 300 MG PO TBCR
300.0000 mg | EXTENDED_RELEASE_TABLET | Freq: Two times a day (BID) | ORAL | Status: DC
  Administered 2018-09-30 – 2018-10-03 (×6): 300 mg via ORAL
  Filled 2018-09-30: qty 14
  Filled 2018-09-30 (×6): qty 1
  Filled 2018-09-30: qty 14
  Filled 2018-09-30 (×2): qty 1

## 2018-09-30 MED ORDER — NICOTINE 21 MG/24HR TD PT24
21.0000 mg | MEDICATED_PATCH | Freq: Every day | TRANSDERMAL | Status: DC
  Administered 2018-09-30 – 2018-10-03 (×4): 21 mg via TRANSDERMAL
  Filled 2018-09-30 (×6): qty 1

## 2018-09-30 MED ORDER — HYDROXYZINE HCL 50 MG PO TABS
50.0000 mg | ORAL_TABLET | Freq: Four times a day (QID) | ORAL | Status: DC | PRN
  Administered 2018-10-01 – 2018-10-02 (×3): 50 mg via ORAL
  Filled 2018-09-30 (×3): qty 1
  Filled 2018-09-30: qty 10

## 2018-09-30 MED ORDER — MIRTAZAPINE 15 MG PO TABS
15.0000 mg | ORAL_TABLET | Freq: Every day | ORAL | Status: DC
  Administered 2018-09-30 – 2018-10-02 (×3): 15 mg via ORAL
  Filled 2018-09-30: qty 1
  Filled 2018-09-30: qty 7
  Filled 2018-09-30 (×3): qty 1

## 2018-09-30 NOTE — Progress Notes (Signed)
D: Pt was in dayroom upon initial approach.  Pt presents with depressed affect and mood.  Brightens with interaction.  Describes his day as "all right" and reports goal is to "sleep."  He reports he had a good visit with his girlfriend tonight.  Pt denies SI/HI, denies hallucinations, denies pain.  Pt has been visible in milieu interacting with peers and staff appropriately.  Pt attended evening group.    A: Met with pt 1:1.  Actively listened to pt and offered support and encouragement. Medications administered per order.  Q15 minute safety checks maintained.  R: Pt is safe on the unit.  Pt is compliant with medications.  Pt verbally contracts for safety.  Will continue to monitor and assess.

## 2018-09-30 NOTE — BHH Suicide Risk Assessment (Signed)
BHH INPATIENT:  Family/Significant Other Suicide Prevention Education  Suicide Prevention Education:  Patient Refusal for Family/Significant Other Suicide Prevention Education: The patient Carlos French has refused to provide written consent for family/significant other to be provided Family/Significant Other Suicide Prevention Education during admission and/or prior to discharge.  Physician notified.  SPE completed with patient, as patient refused to consent to family contact. Patient was encouraged to share information with support network, ask questions, and talk about any concerns relating to SPE. Patient denies access to guns/firearms and verbalized understanding of information provided. Mobile Crisis information also provided to patient.    Maeola Sarah 09/30/2018, 10:44 AM
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
## 2018-09-30 NOTE — Tx Team (Addendum)
Interdisciplinary Treatment and Diagnostic Plan Update  09/30/2018 Time of Session: 0830AM LOYED WILMES MRN: [id number]  Principal Diagnosis: MDD, recurrent, severe  Secondary Diagnoses: Active Problems:   MDD (major depressive disorder), severe (HCC)   Current Medications:  Current Facility-Administered Medications  Medication Dose Route Frequency Provider Last Rate Last Dose  . alum & mag hydroxide-simeth (MAALOX/MYLANTA) 200-200-20 MG/5ML suspension 30 mL  30 mL Oral Q4H PRN Money, Lowry Ram, FNP      . hydrOXYzine (ATARAX/VISTARIL) tablet 25 mg  25 mg Oral Q6H PRN Laverle Hobby, PA-C   25 mg at 09/29/18 2235  . loperamide (IMODIUM) capsule 2-4 mg  2-4 mg Oral PRN Laverle Hobby, PA-C      . LORazepam (ATIVAN) tablet 1 mg  1 mg Oral Q6H PRN Laverle Hobby, PA-C      . LORazepam (ATIVAN) tablet 1 mg  1 mg Oral QID Patriciaann Clan E, PA-C   1 mg at 09/30/18 0825   Followed by  . [START ON 10/01/2018] LORazepam (ATIVAN) tablet 1 mg  1 mg Oral TID Laverle Hobby, PA-C       Followed by  . [START ON 10/02/2018] LORazepam (ATIVAN) tablet 1 mg  1 mg Oral BID Patriciaann Clan E, PA-C       Followed by  . [START ON 10/04/2018] LORazepam (ATIVAN) tablet 1 mg  1 mg Oral Daily Simon, Spencer E, PA-C      . magnesium hydroxide (MILK OF MAGNESIA) suspension 30 mL  30 mL Oral Daily PRN Money, Lowry Ram, FNP      . multivitamin with minerals tablet 1 tablet  1 tablet Oral Daily Laverle Hobby, PA-C   1 tablet at 09/30/18 4098  . nicotine (NICODERM CQ - dosed in mg/24 hours) patch 21 mg  21 mg Transdermal Daily Cobos, Fernando A, MD      . ondansetron (ZOFRAN-ODT) disintegrating tablet 4 mg  4 mg Oral Q6H PRN Patriciaann Clan E, PA-C   4 mg at 09/30/18 1191  . thiamine (VITAMIN B-1) tablet 100 mg  100 mg Oral Daily Laverle Hobby, PA-C   100 mg at 09/30/18 4782  . traZODone (DESYREL) tablet 50 mg  50 mg Oral QHS PRN,MR X 1 Laverle Hobby, PA-C   50 mg at 09/29/18 2235   PTA  Medications: Medications Prior to Admission  Medication Sig Dispense Refill Last Dose  . acetaminophen (TYLENOL) 500 MG tablet Take 500 mg by mouth every 6 (six) hours as needed for mild pain.    Past Week at Unknown time  . hydrOXYzine (ATARAX/VISTARIL) 25 MG tablet Take 1 tablet (25 mg total) by mouth every 6 (six) hours as needed for anxiety. 75 tablet 0 Past Week at Unknown time  . mirtazapine (REMERON) 15 MG tablet Take 1 tablet (15 mg total) by mouth at bedtime. For depression/insomnia 30 tablet 0 Past Week at Unknown time  . nicotine polacrilex (NICORETTE) 4 MG gum Take 1 each (4 mg total) by mouth as needed for smoking cessation. (May buy from over the counter): For smoking cessation 100 tablet 0 Past Month at Unknown time  . pantoprazole (PROTONIX) 40 MG tablet Take 1 tablet (40 mg total) by mouth daily. For acid reflux 30 tablet 0 Past Week at Unknown time  . QUEtiapine (SEROQUEL) 200 MG tablet Take 1 tablet (200 mg total) by mouth at bedtime. For mood control 30 tablet 0 Past Week at Unknown time  . QUEtiapine (SEROQUEL) 50 MG  tablet Take 1 tablet (50 mg total) by mouth 2 (two) times daily. For agitation 60 tablet 0 Past Week at Unknown time  . traZODone (DESYREL) 100 MG tablet Take 1 tablet (100 mg total) by mouth at bedtime. For sleep 30 tablet 0 Past Week at Unknown time    Patient Stressors:    Patient Strengths: Average or above average intelligence Communication skills General fund of knowledge Special hobby/interest Supportive family/friends  Treatment Modalities: Medication Management, Group therapy, Case management,  1 to 1 session with clinician, Psychoeducation, Recreational therapy.   Physician Treatment Plan for Primary Diagnosis: MDD, recurrent, severe  Medication Management: Evaluate patient's response, side effects, and tolerance of medication regimen.  Therapeutic Interventions: 1 to 1 sessions, Unit Group sessions and Medication administration.  Evaluation  of Outcomes: Not Met  Physician Treatment Plan for Secondary Diagnosis: Active Problems:   MDD (major depressive disorder), severe ([place])   Medication Management: Evaluate patient's response, side effects, and tolerance of medication regimen.  Therapeutic Interventions: 1 to 1 sessions, Unit Group sessions and Medication administration.  Evaluation of Outcomes: Not Met  RN Treatment Plan for Primary Diagnosis: MDD, recurrent, severe Long Term Goal(s): Knowledge of disease and therapeutic regimen to maintain health will improve  Short Term Goals: Ability to remain free from injury will improve, Ability to demonstrate self-control, Ability to disclose and discuss suicidal ideas and Ability to identify and develop effective coping behaviors will improve  Medication Management: RN will administer medications as ordered by provider, will assess and evaluate patient's response and provide education to patient for prescribed medication. RN will report any adverse and/or side effects to prescribing provider.  Therapeutic Interventions: 1 on 1 counseling sessions, Psychoeducation, Medication administration, Evaluate responses to treatment, Monitor vital signs and CBGs as ordered, Perform/monitor CIWA, COWS, AIMS and Fall Risk screenings as ordered, Perform wound care treatments as ordered.  Evaluation of Outcomes: Not Met   LCSW Treatment Plan for Primary Diagnosis: MDD, recurrent, severe Long Term Goal(s): Safe transition to appropriate next level of care at discharge, Engage patient in therapeutic group addressing interpersonal concerns.  Short Term Goals: Engage patient in aftercare planning with referrals and resources, Facilitate patient progression through stages of change regarding substance use diagnoses and concerns and Identify triggers associated with mental health/substance abuse issues  Therapeutic Interventions: Assess for all discharge needs, 1 to 1 time with Social worker, Explore  available resources and support systems, Assess for adequacy in community support network, Educate family and significant other(s) on suicide prevention, Complete Psychosocial Assessment, Interpersonal group therapy.  Evaluation of Outcomes: Not Met   Progress in Treatment: Attending groups: No. New to unit. Continuing to assess.  Participating in groups: No. Taking medication as prescribed: Yes. Toleration medication: Yes. Family/Significant other contact made: No, will contact:  family member if pt consents to collateral contact. Patient understands diagnosis: Yes. Discussing patient identified problems/goals with staff: Yes. Medical problems stabilized or resolved: Yes. Denies suicidal/homicidal ideation: Yes. Issues/concerns per patient self-inventory: No. Other: n/a   New problem(s) identified: No, Describe:  n/a  New Short Term/Long Term Goal(s): detox, medication management for mood stabilization; elimination of SI thoughts; development of comprehensive mental wellness/sobriety plan.   Patient Goals:  "I need to get linked up with outpatient treatment."   Discharge Plan or Barriers: CSW assessing. Pt was discharged from [facility] 2 weeks ago and had been referred to [place]. [place] pamphlet, Mobile Crisis information, and AA/NA information provided to patient for additional community support and resources.  Reason for Continuation of Hospitalization: Anxiety Depression Medication stabilization Suicidal ideation Withdrawal symptoms  Estimated Length of Stay: Thursday, 10/03/18  Attendees: Patient: 09/30/2018 8:54 AM  Physician: Dr. Parke Poisson MD; Dr. Nancy Fetter MD 09/30/2018 8:54 AM  Nursing: Rise Paganini RN; Olivette RN 09/30/2018 8:54 AM  RN Care Manager:x 09/30/2018 8:54 AM  Social Worker: Janice Norrie LCSW 09/30/2018 8:54 AM  Recreational Therapist: x 09/30/2018 8:54 AM  Other: Ricky Ala NP 09/30/2018 8:54 AM  Other:  09/30/2018 8:54 AM  Other: 09/30/2018 8:54  AM    Scribe for Treatment Team: Avelina Laine, LCSW 09/30/2018 8:54 AM
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
## 2018-09-30 NOTE — Progress Notes (Signed)
D:  Patient's self inventory sheet, patient has poor sleep, sleep medictiton not helpful.  Fair appetite, low energy level, poor concentration.  Rated depression and hopeless 8, anxiety 10.  Withdrawals, tremors, chilling, cravings, agitation, nausea, runny nose, irritability.  Denied SI.  Physical problems, lightheaded, dizziness.  Denied physical pain.  Goal is talk to MD.  No discharge plans. A:  Medications administered per MD orders.  Emotional support and encouragement given patient. R:  Denied SI and HI, contracts for safety.  Denied A/V hallucinations.  Safety maintained with 15 minute checks.

## 2018-09-30 NOTE — Plan of Care (Signed)
Nurse discussed anxiety, depression, coping skills with patient. 

## 2018-09-30 NOTE — BHH Group Notes (Signed)
LCSW Group Therapy Note   09/30/2018 1:15pm   Type of Therapy and Topic:  Group Therapy:  Overcoming Obstacles   Participation Level:  Did Not Attend   Description of Group:    In this group patients will be encouraged to explore what they see as obstacles to their own wellness and recovery. They will be guided to discuss their thoughts, feelings, and behaviors related to these obstacles. The group will process together ways to cope with barriers, with attention given to specific choices patients can make. Each patient will be challenged to identify changes they are motivated to make in order to overcome their obstacles. This group will be process-oriented, with patients participating in exploration of their own experiences as well as giving and receiving support and challenge from other group members.   Therapeutic Goals: 1. Patient will identify personal and current obstacles as they relate to admission. 2. Patient will identify barriers that currently interfere with their wellness or overcoming obstacles.  3. Patient will identify feelings, thought process and behaviors related to these barriers. 4. Patient will identify two changes they are willing to make to overcome these obstacles:      Summary of Patient Progress   x  Therapeutic Modalities:   Cognitive Behavioral Therapy Solution Focused Therapy Motivational Interviewing Relapse Prevention Therapy  Rona Ravens, LCSW 09/30/2018 1:06 PM
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
## 2018-10-01 NOTE — Plan of Care (Signed)
Nurse discussed anxiety, depression, coping skills with patient. 

## 2018-10-01 NOTE — Progress Notes (Signed)
D:  Patient's self inventory sheet, patient sleeps good, sleep medication helpful.  Fair appetite, normal energy level, good concentration.  Rate depression 6, hopeless 4, anxiety 8.  Withdrawals, tremors, chilling, cravings, agitation, nausea, runny nose, irritability.  Denied SI.  Denied physical problems.  Denied physical pain.  Goal is talk to MD.  Plans to meet with MD.  Does have discharge plans. A:  Medications administered per MD orders.  Emotional support and encouragement given patient. R:  Denied SI and HI, contracts for safety.  Denied A/V hallucinations.  Safety maintained with 15 minute checks.

## 2018-10-01 NOTE — [facility] Group Notes (Signed)
[facility] Group Therapy 10/01/2018 1:15pm  Type of Therapy: [facility] Presentation  Participation Level: Active  Participation Quality: Attentive  Affect: Appropriate  Cognitive: Oriented  Insight: Developing/Improving  Engagement in Therapy: Engaged  Modes of Intervention: Discussion, Education and Socialization  Summary of Progress/Problems: [facility] ([facility]) Speaker came to talk about his personal journey with mental health. The pt processed ways by which to relate to the speaker. [facility] speaker provided handouts and educational information pertaining to groups and services offered by the [facility]. Pt was engaged in speaker's presentation and was receptive to resources provided.    Kristien Salatino S Jarod Bozzo, LCSW 10/01/2018 2:04 PM  

## 2018-10-01 NOTE — Progress Notes (Signed)
[facility] MD Progress Note  10/01/2018 2:06 PM Carlos French  MRN:  [id number] Subjective:  I feel better than I did yesterday. I am having sweaty palms and shakiness, tremors.    Objective: 27 year old male with history of MDD, Borderline personality disorder, and alcohol use disored admitted via IVC after attempting suicide via jumping out of a moving vehicle. He was recently discharged from [facility] in 09/18/2018 and did not follow up with inpatient rehab. He still feels as thought hat is the best decision form him to follow up outpatient therapy, desprite recurrent admissions within 2 weeks. He reporsts some withdrawal symptoms during this evaluation and is observed in SW group. His goal today is to take his mediatication remain compliant and get through the day. He is eating and sleeping well at this time. He denies SI/HI/AVH at this time.    Principal Problem: MDD (major depressive disorder), recurrent, severe, with psychosis (HCC) Diagnosis:   Patient Active Problem List   Diagnosis Date Noted  . 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 PTSD (post-traumatic stress disorder) [F43.10] 09/16/2018  . MDD (major depressive disorder), recurrent severe, without psychosis (HCC) [F33.2] 11/15/2017  . MDD (major depressive disorder), recurrent, severe, with psychosis (HCC) [F33.3] 11/03/2017  . Depression [F32.9]   . Borderline personality disorder (HCC) [F60.3] 02/10/2016  . Substance induced mood disorder (HCC) [F19.94] 02/09/2016  . Alcohol withdrawal (HCC) [F10.239] 08/23/2015  . Tobacco use disorder [F17.200] 08/23/2015  . Cocaine use disorder, severe, dependence (HCC) [F14.20] 08/11/2015  . Alcohol use disorder, severe, dependence (HCC) [F10.20] 08/11/2015  . Cannabis use disorder, severe, dependence (HCC) [F12.20] 08/11/2015   Total Time spent with patient: 20 minutes  Past Psychiatric History: MDD, Borderline personality disorder, PTSD, OCD  Recent admission to [facility] in  September was discharged however did not follow up with outpatient resources.   Past Medical History:  Past Medical History:  Diagnosis Date  . Allergy   . Anxiety   . Bipolar 1 disorder (HCC)   . Depression   . GERD (gastroesophageal reflux disease)   . GI (gastrointestinal bleed)   . GI bleeding   . PTSD (post-traumatic stress disorder)   . Suicidal ideation     Past Surgical History:  Procedure Laterality Date  . NO PAST SURGERIES     Family History:  Family History  Problem Relation Age of Onset  . Stroke Mother    Family Psychiatric  History: pt's mother has hx of bipolar disorder. Social History:  Social History   Substance and Sexual Activity  Alcohol Use Yes  . Alcohol/week: 84.0 standard drinks  . Types: 84 Cans of beer per week   Comment: Daily     Social History   Substance and Sexual Activity  Drug Use No  . Types: Marijuana, Cocaine    Social History   Socioeconomic History  . Marital status: Significant Other    Spouse name: Not on file  . Number of children: Not on file  . Years of education: Not on file  . Highest education level: Not on file  Occupational History  . Not on file  Social Needs  . Financial resource strain: Not on file  . Food insecurity:    Worry: Not on file    Inability: Not on file  . Transportation needs:    Medical: Not on file    Non-medical: Not on file  Tobacco Use  . Smoking status: Current Every Day Smoker    Packs/day:  2.00    Types: Cigarettes  . Smokeless tobacco: Former Engineer, water and Sexual Activity  . Alcohol use: Yes    Alcohol/week: 84.0 standard drinks    Types: 84 Cans of beer per week    Comment: Daily  . Drug use: No    Types: Marijuana, Cocaine  . Sexual activity: Yes    Birth control/protection: None  Lifestyle  . Physical activity:    Days per week: Not on file    Minutes per session: Not on file  . Stress: Not on file  Relationships  . Social connections:    Talks on phone:  Not on file    Gets together: Not on file    Attends religious service: Not on file    Active member of club or organization: Not on file    Attends meetings of clubs or organizations: Not on file    Relationship status: Not on file  Other Topics Concern  . Not on file  Social History Narrative  . Not on file   Additional Social History:    Pain Medications: denies Prescriptions: denies Over the Counter: denies History of alcohol / drug use?: Yes Longest period of sobriety (when/how long): 5 monhts Negative Consequences of Use: Financial, Personal relationships, Work / Programmer, multimedia Withdrawal Symptoms: Tremors, Patient aware of relationship between substance abuse and physical/medical complications Name of Substance 1: alcohol 1 - Age of First Use: 15 1 - Amount (size/oz): 12-24 beers 1 - Frequency: daily 1 - Duration: 12 years 1 - Last Use / Amount: 09/26/18 Name of Substance 2: marijuana 2 - Age of First Use: 12 2 - Amount (size/oz): half gram 2 - Frequency: daily 2 - Duration: 12 years 2 - Last Use / Amount: 09/26/2018     Sleep: Fair  Appetite:  Fair  Current Medications: Current Facility-Administered Medications  Medication Dose Route Frequency Provider Last Rate Last Dose  . alum & mag hydroxide-simeth (MAALOX/MYLANTA) 200-200-20 MG/5ML suspension 30 mL  30 mL Oral Q4H PRN Money, Feliz Beam B, FNP   30 mL at 10/01/18 0842  . hydrOXYzine (ATARAX/VISTARIL) tablet 50 mg  50 mg Oral Q6H PRN Micheal Likens, MD      . lithium carbonate (LITHOBID) CR tablet 300 mg  300 mg Oral Q12H Micheal Likens, MD   300 mg at 10/01/18 0837  . loperamide (IMODIUM) capsule 2-4 mg  2-4 mg Oral PRN Kerry Hough, PA-C      . LORazepam (ATIVAN) tablet 1 mg  1 mg Oral Q6H PRN Kerry Hough, PA-C      . LORazepam (ATIVAN) tablet 1 mg  1 mg Oral TID Donell Sievert E, PA-C   1 mg at 10/01/18 1211   Followed by  . [START ON 10/02/2018] LORazepam (ATIVAN) tablet 1 mg  1 mg Oral BID  Donell Sievert E, PA-C       Followed by  . [START ON 10/04/2018] LORazepam (ATIVAN) tablet 1 mg  1 mg Oral Daily Simon, Spencer E, PA-C      . magnesium hydroxide (MILK OF MAGNESIA) suspension 30 mL  30 mL Oral Daily PRN Money, Feliz Beam B, FNP      . mirtazapine (REMERON) tablet 15 mg  15 mg Oral QHS Micheal Likens, MD   15 mg at 09/30/18 2229  . multivitamin with minerals tablet 1 tablet  1 tablet Oral Daily Kerry Hough, PA-C   1 tablet at 10/01/18 0837  . nicotine (NICODERM CQ - dosed  in mg/24 hours) patch 21 mg  21 mg Transdermal Daily Cobos, Rockey Situ, MD   21 mg at 10/01/18 1610  . OLANZapine (ZYPREXA) tablet 10 mg  10 mg Oral QHS Micheal Likens, MD   10 mg at 09/30/18 2229  . ondansetron (ZOFRAN-ODT) disintegrating tablet 4 mg  4 mg Oral Q6H PRN Kerry Hough, PA-C   4 mg at 10/01/18 0841  . ramelteon (ROZEREM) tablet 8 mg  8 mg Oral QHS Micheal Likens, MD   8 mg at 09/30/18 2127  . thiamine (VITAMIN B-1) tablet 100 mg  100 mg Oral Daily Kerry Hough, PA-C   100 mg at 10/01/18 9604  . traZODone (DESYREL) tablet 50 mg  50 mg Oral QHS PRN,MR X 1 Kerry Hough, PA-C   50 mg at 09/29/18 2235    Lab Results: No results found for this or any previous visit (from the past 48 hour(s)).  Blood Alcohol level:  Lab Results  Component Value Date   ETH 154 (H) 09/26/2018   ETH 224 (H) 09/15/2018    Metabolic Disorder Labs: Lab Results  Component Value Date   HGBA1C 4.6 02/10/2016   Lab Results  Component Value Date   PROLACTIN 32.3 (H) 02/10/2016   Lab Results  Component Value Date   CHOL 209 (H) 02/10/2016   TRIG 120 02/10/2016   HDL 86 02/10/2016   CHOLHDL 2.4 02/10/2016   VLDL 24 02/10/2016   LDLCALC 99 02/10/2016   LDLCALC 100 (H) 08/12/2015    Physical Findings: AIMS: Facial and Oral Movements Muscles of Facial Expression: None, normal Lips and Perioral Area: None, normal Jaw: None, normal Tongue: None, normal,Extremity  Movements Upper (arms, wrists, hands, fingers): None, normal Lower (legs, knees, ankles, toes): None, normal, Trunk Movements Neck, shoulders, hips: None, normal, Overall Severity Severity of abnormal movements (highest score from questions above): None, normal Incapacitation due to abnormal movements: None, normal Patient's awareness of abnormal movements (rate only patient's report): No Awareness, Dental Status Current problems with teeth and/or dentures?: No Does patient usually wear dentures?: No  CIWA:  CIWA-Ar Total: 5 COWS:  COWS Total Score: 4  Musculoskeletal: Strength & Muscle Tone: within normal limits Gait & Station: normal Patient leans: N/A  Psychiatric Specialty Exam: Physical Exam  ROS  Blood pressure 125/84, pulse 96, temperature 98.4 F (36.9 C), temperature source Oral, resp. rate 16, height 5' 4.5" (1.638 m), weight 49.9 kg.Body mass index is 18.59 kg/m.  General Appearance: Disheveled and Guarded  Eye Contact:  Fair  Speech:  Clear and Coherent and Normal Rate  Volume:  Normal  Mood:  Depressed and Dysphoric  Affect:  Depressed  Thought Process:  Coherent and Descriptions of Associations: Intact  Orientation:  Full (Time, Place, and Person)  Thought Content:  Logical  Suicidal Thoughts:  No  Homicidal Thoughts:  No  Memory:  Immediate;   Fair Recent;   Fair  Judgement:  Poor  Insight:  Shallow  Psychomotor Activity:  Normal  Concentration:  Concentration: Fair and Attention Span: Fair  Recall:  [employer] of Knowledge:  Fair  Language:  Fair  Akathisia:  No  Handed:  Right  AIMS (if indicated):     Assets:  Architect Leisure Time Physical Health Social Support  ADL's:  Intact  Cognition:  WNL  Sleep:  Number of Hours: 6     Treatment Plan Summary: Daily contact with patient to assess and evaluate symptoms and progress in  treatment and Medication management   1 Admit for crisis management and  stabilization.  2. Medication management to reduce symptoms to baseline and improved the patient's overall level of functioning. Closely monitor the side effects, efficacy and therapeutic response of medication.  3. Treat health problem as indicated. Alcohol USE-COntinue CIWA protocol at this time.  Mood disorder-Continue Remeron 15mg  po qhs for depressive symptoms. COntinue Lithium 300mg  po BID and zyprexa 10mg  po daily for mood stabilization and impulsivity.  Insomnia- Rozerem 8mg  po qhs.  4. Developed treatment plan to decrease the risk of relapse upon discharge and to reduce the need for readmission.  5. Psychosocial education regarding relapse prevention in self-care.  6. Healthcare followup as needed for medical problems and called consults as indicated.  7. Increase collateral information.  8. Restart home medication where appropriate  9. Encouraged to participate and verbalize into group milieu therapy.   Maryagnes Amos, FNP 10/01/2018, 2:06 PM

## 2018-10-02 DIAGNOSIS — F17210 Nicotine dependence, cigarettes, uncomplicated: Secondary | ICD-10-CM

## 2018-10-02 DIAGNOSIS — G4700 Insomnia, unspecified: Secondary | ICD-10-CM

## 2018-10-02 MED ORDER — HYDROXYZINE HCL 25 MG PO TABS
25.0000 mg | ORAL_TABLET | Freq: Once | ORAL | Status: DC
  Filled 2018-10-02: qty 1

## 2018-10-02 MED ORDER — POTASSIUM CHLORIDE CRYS ER 10 MEQ PO TBCR
10.0000 meq | EXTENDED_RELEASE_TABLET | Freq: Once | ORAL | Status: DC
  Filled 2018-10-02: qty 1

## 2018-10-02 NOTE — Progress Notes (Signed)
Recreation Therapy Notes  Date: 10.9.19 Time: 0930 Location: 300 Hall Dayroom  Group Topic: Stress Management  Goal Area(s) Addresses:  Patient will verbalize importance of using healthy stress management.  Patient will identify positive emotions associated with healthy stress management.   Intervention: Stress Management  Activity :  Guided Imagery.  LRT introduced the stress management technique of guided imagery.  LRT read a script that allowed patients to envision a starry sky at night.  Patients were to listen as the script was read to engage in the activity.  Education:  Stress Management, Discharge Planning.   Education Outcome: Acknowledges edcuation/In group clarification offered/Needs additional education  Clinical Observations/Feedback:  Pt did not attend group.      Caroll Rancher, LRT/CTRS         Caroll Rancher A 10/02/2018 12:32 PM

## 2018-10-02 NOTE — Progress Notes (Signed)
Adult Psychoeducational Group Note  Date:  10/02/2018 Time:  1600 Group Topic/Focus:  Personal Development   Participation Level:  Active  Participation Quality:  Appropriate  Affect:  Appropriate  Cognitive:  Appropriate  Insight: Appropriate  Engagement in Group:  Monopolizing  Modes of Intervention:  Activity  Additional Comments:

## 2018-10-02 NOTE — B[facility] Group Notes (Signed)
LCSW Group Therapy Note 10/02/2018 2:41 PM  Type of Therapy/Topic: Group Therapy: Feelings about Diagnosis  Participation Level: Active   Description of Group:  This group will allow patients to explore their thoughts and feelings about diagnoses they have received. Patients will be guided to explore their level of understanding and acceptance of these diagnoses. Facilitator will encourage patients to process their thoughts and feelings about the reactions of others to their diagnosis and will guide patients in identifying ways to discuss their diagnosis with significant others in their lives. This group will be process-oriented, with patients participating in exploration of their own experiences, giving and receiving support, and processing challenge from other group members.  Therapeutic Goals: 1. Patient will demonstrate understanding of diagnosis as evidenced by identifying two or more symptoms of the disorder 2. Patient will be able to express two feelings regarding the diagnosis 3. Patient will demonstrate their ability to communicate their needs through discussion and/or role play  Summary of Patient Progress:  Carlos French was engaged and participated throughout the group session. Carlos French states that he felt overwhelmed when on learning new coping skills. Carlos French states he plan on using his ten year old daughter as a mode of motivation.     Therapeutic Modalities:  Cognitive Behavioral Therapy Brief Therapy Feelings Identification    [name] Catalina [place]ua Clinical Social Worker

## 2018-10-02 NOTE — Plan of Care (Signed)
D: Patient presents flat, depressed. He reports his mood is "good." He complains of indigestion and heartburn. He slept well last night and received medication that was helpful. His appetite is good, energy normal and concentration good. He rates his depression and hopelessness 2/10 and anxiety 5/10. He complains of tremors, cravings, runny nose and irritability. Patient denies SI/HI/AVH.  A: Administered maalox for indigestion. Patient checked q15 min, and checks reviewed. Reviewed medication changes with patient and educated on side effects. Educated patient on importance of attending group therapy sessions and educated on several coping skills. Encouarged participation in milieu through recreation therapy and attending meals with peers. Support and encouragement provided. Fluids offered. R: Patient receptive to education on medications, and is medication compliant. Patient contracts for safety on the unit. Goal: "Talk to doctor about discharge" and "talk to Child psychotherapist."

## 2018-10-02 NOTE — Therapy (Signed)
Occupational Therapy Group Note  Date:  10/02/2018 Time:  12:59 PM  Group Topic/Focus:  Self Esteem  Participation Level:  Active  Participation Quality:  Redirectable  Affect:  Flat  Cognitive:  Appropriate  Insight: Improving  Engagement in Group:  Engaged  Modes of Intervention:  Activity, Discussion, Education and Socialization  Additional Comments:    S: "Relationships help bring up self esteem when they are healthy"  O: OT tx with focus on self esteem building this date. Education given on definition of self esteem, with both causes of low and high self esteem identified. Activity given for pt to identify a positive/aspiring trait for each letter of the alphabet. Pt to work with peers to help complete activity and build positive thinking.   A: Pt presents to group with flat affect, engaged and participatory throughout session. Pt occasionally needing redirection, talking and chatting with other group members when others were sharing. Pt helped add to self esteem education by using workbook given for [facility] stay. Pt shared important points by reading them aloud. A-z activity completed with min-mod VC's. Pt appropriately brainstorming with other peers for support. Noted improved affect after completion of activity.   P: Education given on self esteem and how to improve this date. Handouts and activities given to help facilitate skills when reintegrating into community.   Dalphine Handing, MSOT, OTR/L Behavioral Health OT/ Acute Relief OT  Dalphine Handing 10/02/2018, 12:59 PM

## 2018-10-02 NOTE — Progress Notes (Signed)
When asked about his day pt stated, " It's rough. A bunch of emotional stuff." Pt informed the writer that he's "ready to go home and do O/P at [facility]". Stated, "I'm ready to get back to my family and my life". Pt informed the writer that he wants vistaril with his remeron, because "they are taking away his ativan". Pt has no other questions or concerns.    A: Writer gave vistaril as requested. Support and encouragement was offered. 15 min checks continued for safety.  R: Pt remains safe.

## 2018-10-02 NOTE — Progress Notes (Signed)
[name] Progress Note  10/02/2018 10:42 AM Carlos French  MRN:  [id number]   Subjective: Patient reports this morning that he is doing extremely well.  He denies any suicidal or homicidal ideations and denies any hallucinations.  He also denies that he is having any withdrawal symptoms.  He reports sleeping very well last night and that he has been eating very well.  He reports that he has a good plan for when he leaves and plans to go to [facility] for follow-up and is planning on joining an [employer]  He states that the medications have really helped him out and plans on continuing them as prescribed.  He is hoping to discharge within the next day or so and feels that he is ready to go.  Objective: Patient's chart and findings reviewed and discussed with treatment team.  Patient presents in his room lying in his bed, but he is awake.  He is pleasant, calm, and cooperative.  He has a bright affect and has been seen interacting with peers and staff on the unit appropriately.  Patient has been attending groups and going to meals as scheduled.   Principal Problem: MDD (major depressive disorder), recurrent, severe, with psychosis (HCC) Diagnosis:   Patient Active Problem List   Diagnosis Date Noted  . 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 PTSD (post-traumatic stress disorder) [F43.10] 09/16/2018  . MDD (major depressive disorder), recurrent severe, without psychosis (HCC) [F33.2] 11/15/2017  . MDD (major depressive disorder), recurrent, severe, with psychosis (HCC) [F33.3] 11/03/2017  . Depression [F32.9]   . Borderline personality disorder (HCC) [F60.3] 02/10/2016  . Substance induced mood disorder (HCC) [F19.94] 02/09/2016  . Alcohol withdrawal (HCC) [F10.239] 08/23/2015  . Tobacco use disorder [F17.200] 08/23/2015  . Cocaine use disorder, severe, dependence (HCC) [F14.20] 08/11/2015  . Alcohol use disorder, severe, dependence (HCC) [F10.20] 08/11/2015  . Cannabis use  disorder, severe, dependence (HCC) [F12.20] 08/11/2015   Total Time spent with patient: 20 minutes  Past Psychiatric History: See H&P  Past Medical History:  Past Medical History:  Diagnosis Date  . Allergy   . Anxiety   . Bipolar 1 disorder (HCC)   . Depression   . GERD (gastroesophageal reflux disease)   . GI (gastrointestinal bleed)   . GI bleeding   . PTSD (post-traumatic stress disorder)   . Suicidal ideation     Past Surgical History:  Procedure Laterality Date  . NO PAST SURGERIES     Family History:  Family History  Problem Relation Age of Onset  . Stroke Mother    Family Psychiatric  History: See H&P Social History:  Social History   Substance and Sexual Activity  Alcohol Use Yes  . Alcohol/week: 84.0 standard drinks  . Types: 84 Cans of beer per week   Comment: Daily     Social History   Substance and Sexual Activity  Drug Use No  . Types: Marijuana, Cocaine    Social History   Socioeconomic History  . Marital status: Significant Other    Spouse name: Not on file  . Number of children: Not on file  . Years of education: Not on file  . Highest education level: Not on file  Occupational History  . Not on file  Social Needs  . Financial resource strain: Not on file  . Food insecurity:    Worry: Not on file    Inability: Not on file  . Transportation needs:    Medical: Not on  file    Non-medical: Not on file  Tobacco Use  . Smoking status: Current Every Day Smoker    Packs/day: 2.00    Types: Cigarettes  . Smokeless tobacco: Former Engineer, water and Sexual Activity  . Alcohol use: Yes    Alcohol/week: 84.0 standard drinks    Types: 84 Cans of beer per week    Comment: Daily  . Drug use: No    Types: Marijuana, Cocaine  . Sexual activity: Yes    Birth control/protection: None  Lifestyle  . Physical activity:    Days per week: Not on file    Minutes per session: Not on file  . Stress: Not on file  Relationships  . Social  connections:    Talks on phone: Not on file    Gets together: Not on file    Attends religious service: Not on file    Active member of club or organization: Not on file    Attends meetings of clubs or organizations: Not on file    Relationship status: Not on file  Other Topics Concern  . Not on file  Social History Narrative  . Not on file   Additional Social History:    Pain Medications: denies Prescriptions: denies Over the Counter: denies History of alcohol / drug use?: Yes Longest period of sobriety (when/how long): 5 monhts Negative Consequences of Use: Financial, Personal relationships, Work / Programmer, multimedia Withdrawal Symptoms: Tremors, Patient aware of relationship between substance abuse and physical/medical complications Name of Substance 1: alcohol 1 - Age of First Use: 15 1 - Amount (size/oz): 12-24 beers 1 - Frequency: daily 1 - Duration: 12 years 1 - Last Use / Amount: 09/26/18 Name of Substance 2: marijuana 2 - Age of First Use: 12 2 - Amount (size/oz): half gram 2 - Frequency: daily 2 - Duration: 12 years 2 - Last Use / Amount: 09/26/2018                Sleep: Good  Appetite:  Good  Current Medications: Current Facility-Administered Medications  Medication Dose Route Frequency Provider Last Rate Last Dose  . alum & mag hydroxide-simeth (MAALOX/MYLANTA) 200-200-20 MG/5ML suspension 30 mL  30 mL Oral Q4H PRN Kelsey Edman, Feliz Beam B, FNP   30 mL at 10/01/18 0842  . hydrOXYzine (ATARAX/VISTARIL) tablet 50 mg  50 mg Oral Q6H PRN Micheal Likens, MD   50 mg at 10/01/18 2252  . lithium carbonate (LITHOBID) CR tablet 300 mg  300 mg Oral Q12H Micheal Likens, MD   300 mg at 10/02/18 1610  . loperamide (IMODIUM) capsule 2-4 mg  2-4 mg Oral PRN Kerry Hough, PA-C      . LORazepam (ATIVAN) tablet 1 mg  1 mg Oral Q6H PRN Kerry Hough, PA-C      . LORazepam (ATIVAN) tablet 1 mg  1 mg Oral BID Donell Sievert E, PA-C       Followed by  . [START ON  10/04/2018] LORazepam (ATIVAN) tablet 1 mg  1 mg Oral Daily Simon, Spencer E, PA-C      . magnesium hydroxide (MILK OF MAGNESIA) suspension 30 mL  30 mL Oral Daily PRN Fenix Ruppe, Feliz Beam B, FNP      . mirtazapine (REMERON) tablet 15 mg  15 mg Oral QHS Micheal Likens, MD   15 mg at 10/01/18 2250  . multivitamin with minerals tablet 1 tablet  1 tablet Oral Daily Kerry Hough, PA-C   1 tablet at 10/02/18 9604  .  nicotine (NICODERM CQ - dosed in mg/24 hours) patch 21 mg  21 mg Transdermal Daily Cobos, Rockey Situ, MD   21 mg at 10/02/18 1610  . OLANZapine (ZYPREXA) tablet 10 mg  10 mg Oral QHS Micheal Likens, MD   10 mg at 10/01/18 2250  . ondansetron (ZOFRAN-ODT) disintegrating tablet 4 mg  4 mg Oral Q6H PRN Kerry Hough, PA-C   4 mg at 10/01/18 0841  . ramelteon (ROZEREM) tablet 8 mg  8 mg Oral QHS Micheal Likens, MD   8 mg at 10/01/18 2013  . thiamine (VITAMIN B-1) tablet 100 mg  100 mg Oral Daily Kerry Hough, PA-C   100 mg at 10/02/18 9604  . traZODone (DESYREL) tablet 50 mg  50 mg Oral QHS PRN,MR X 1 Kerry Hough, PA-C   50 mg at 09/29/18 2235    Lab Results: No results found for this or any previous visit (from the past 48 hour(s)).  Blood Alcohol level:  Lab Results  Component Value Date   ETH 154 (H) 09/26/2018   ETH 224 (H) 09/15/2018    Metabolic Disorder Labs: Lab Results  Component Value Date   HGBA1C 4.6 02/10/2016   Lab Results  Component Value Date   PROLACTIN 32.3 (H) 02/10/2016   Lab Results  Component Value Date   CHOL 209 (H) 02/10/2016   TRIG 120 02/10/2016   HDL 86 02/10/2016   CHOLHDL 2.4 02/10/2016   VLDL 24 02/10/2016   LDLCALC 99 02/10/2016   LDLCALC 100 (H) 08/12/2015    Physical Findings: AIMS: Facial and Oral Movements Muscles of Facial Expression: None, normal Lips and Perioral Area: None, normal Jaw: None, normal Tongue: None, normal,Extremity Movements Upper (arms, wrists, hands, fingers): None,  normal Lower (legs, knees, ankles, toes): None, normal, Trunk Movements Neck, shoulders, hips: None, normal, Overall Severity Severity of abnormal movements (highest score from questions above): None, normal Incapacitation due to abnormal movements: None, normal Patient's awareness of abnormal movements (rate only patient's report): No Awareness, Dental Status Current problems with teeth and/or dentures?: No Does patient usually wear dentures?: No  CIWA:  CIWA-Ar Total: 1 COWS:  COWS Total Score: 2  Musculoskeletal: Strength & Muscle Tone: within normal limits Gait & Station: normal Patient leans: N/A  Psychiatric Specialty Exam: Physical Exam  Nursing note and vitals reviewed. Constitutional: He is oriented to person, place, and time. He appears well-developed and well-nourished.  Respiratory: Effort normal.  Musculoskeletal: Normal range of motion.  Neurological: He is alert and oriented to person, place, and time.  Skin: Skin is warm.    Review of Systems  Constitutional: Negative.   HENT: Negative.   Eyes: Negative.   Respiratory: Negative.   Cardiovascular: Negative.   Gastrointestinal: Negative.   Genitourinary: Negative.   Musculoskeletal: Negative.   Skin: Negative.   Neurological: Negative.   Endo/Heme/Allergies: Negative.   Psychiatric/Behavioral: Negative.     Blood pressure 127/88, pulse (!) 104, temperature 97.8 F (36.6 C), temperature source Oral, resp. rate 16, height 5' 4.5" (1.638 m), weight 49.9 kg.Body mass index is 18.59 kg/m.  General Appearance: Casual  Eye Contact:  Good  Speech:  Clear and Coherent and Normal Rate  Volume:  Normal  Mood:  Euthymic  Affect:  Congruent  Thought Process:  Goal Directed and Descriptions of Associations: Intact  Orientation:  Full (Time, Place, and Person)  Thought Content:  WDL  Suicidal Thoughts:  No  Homicidal Thoughts:  No  Memory:  Immediate;  Good Recent;   Good Remote;   Good  Judgement:  Fair   Insight:  Fair  Psychomotor Activity:  Normal  Concentration:  Concentration: Good and Attention Span: Good  Recall:  Good  Fund of Knowledge:  Good  Language:  Good  Akathisia:  No  Handed:  Right  AIMS (if indicated):     Assets:  Communication Skills Desire for Improvement Physical Health Social Support  ADL's:  Intact  Cognition:  WNL  Sleep:  Number of Hours: 6.25   Problems addressed MDD severe recurrent with psychosis Borderline personality disorder Alcohol use disorder severe dependence Moderate cannabis use disorder  Treatment Plan Summary: Daily contact with patient to assess and evaluate symptoms and progress in treatment, Medication management and Plan is to: Continue Vistaril 50 mg p.o. every 6 hours as needed for anxiety Continue lithium carbonate 300 mg p.o. every 12 hours for mood stability Continue Ativan detox protocol Continue Remeron 15 mg p.o. nightly for mood stability and sleep Continue Zyprexa 10 mg p.o. nightly for mood stability Continue Rozerem 8 mg p.o. nightly for insomnia Continue trazodone 50 mg p.o. nightly as needed for insomnia Encourage group therapy participation Plan to discharge is set for tomorrow  Maryfrances Bunnell, FNP 10/02/2018, 10:42 AM

## 2018-10-03 MED ORDER — OLANZAPINE 10 MG PO TABS: 10.0000 mg | ORAL_TABLET | Freq: Every day | ORAL | 0 refills | Status: DC

## 2018-10-03 MED ORDER — LITHIUM CARBONATE ER 300 MG PO TBCR: 300.0000 mg | EXTENDED_RELEASE_TABLET | Freq: Two times a day (BID) | ORAL | 0 refills | Status: DC

## 2018-10-03 MED ORDER — HYDROXYZINE HCL 50 MG PO TABS: 50.0000 mg | ORAL_TABLET | Freq: Four times a day (QID) | ORAL | 0 refills | Status: DC | PRN

## 2018-10-03 MED ORDER — MIRTAZAPINE 15 MG PO TABS: 15.0000 mg | ORAL_TABLET | Freq: Every day | ORAL | 0 refills | Status: DC

## 2018-10-03 MED ORDER — TRAZODONE HCL 50 MG PO TABS
50.0000 mg | ORAL_TABLET | Freq: Every evening | ORAL | Status: DC | PRN
  Filled 2018-10-03: qty 7

## 2018-10-03 MED ORDER — RAMELTEON 8 MG PO TABS: 8.0000 mg | ORAL_TABLET | Freq: Every day | ORAL | 0 refills | Status: DC

## 2018-10-03 MED ORDER — TRAZODONE HCL 100 MG PO TABS
50.0000 mg | ORAL_TABLET | Freq: Every evening | ORAL | Status: DC | PRN
  Filled 2018-10-03: qty 4

## 2018-10-03 MED ORDER — TRAZODONE HCL 50 MG PO TABS: 50.0000 mg | ORAL_TABLET | Freq: Every evening | ORAL | 0 refills | Status: DC | PRN

## 2018-10-03 NOTE — BHH Suicide Risk Assessment (Signed)
[facility] Discharge Suicide Risk Assessment   Principal Problem: MDD (major depressive disorder), recurrent, severe, with psychosis (HCC) Discharge Diagnoses:  Patient Active Problem List   Diagnosis Date Noted  . 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 PTSD (post-traumatic stress disorder) [F43.10] 09/16/2018  . MDD (major depressive disorder), recurrent severe, without psychosis (HCC) [F33.2] 11/15/2017  . MDD (major depressive disorder), recurrent, severe, with psychosis (HCC) [F33.3] 11/03/2017  . Depression [F32.9]   . Borderline personality disorder (HCC) [F60.3] 02/10/2016  . Substance induced mood disorder (HCC) [F19.94] 02/09/2016  . Alcohol withdrawal (HCC) [F10.239] 08/23/2015  . Tobacco use disorder [F17.200] 08/23/2015  . Cocaine use disorder, severe, dependence (HCC) [F14.20] 08/11/2015  . Alcohol use disorder, severe, dependence (HCC) [F10.20] 08/11/2015  . Cannabis use disorder, severe, dependence (HCC) [F12.20] 08/11/2015    Total Time spent with patient: 30 minutes  Musculoskeletal: Strength & Muscle Tone: within normal limits Gait & Station: normal Patient leans: N/A  Psychiatric Specialty Exam: Review of Systems  Constitutional: Negative for chills and fever.  Respiratory: Negative for cough and shortness of breath.   Cardiovascular: Negative for chest pain.  Gastrointestinal: Negative for abdominal pain, heartburn, nausea and vomiting.  Psychiatric/Behavioral: Negative for depression, hallucinations and suicidal ideas. The patient is not nervous/anxious and does not have insomnia.     Blood pressure 120/86, pulse 80, temperature 97.6 F (36.4 C), temperature source Oral, resp. rate 16, height 5' 4.5" (1.638 m), weight 49.9 kg.Body mass index is 18.59 kg/m.  General Appearance: Casual and Fairly Groomed  Patent attorney::  Good  Speech:  Clear and Coherent and Normal Rate  Volume:  Normal  Mood:  Euthymic  Affect:  Appropriate and Congruent   Thought Process:  Coherent and Goal Directed  Orientation:  Full (Time, Place, and Person)  Thought Content:  Logical  Suicidal Thoughts:  No  Homicidal Thoughts:  No  Memory:  Immediate;   Fair Recent;   Fair Remote;   Fair  Judgement:  Fair  Insight:  Fair  Psychomotor Activity:  Normal  Concentration:  Good  Recall:  Good  Fund of Knowledge:Good  Language: Good  Akathisia:  No  Handed:    AIMS (if indicated):     Assets:  Communication Skills Resilience Social Support  Sleep:  Number of Hours: 6  Cognition: WNL  ADL's:  Intact   Mental Status Per Nursing Assessment::   On Admission:  NA  Demographic Factors:  Male, Adolescent or young adult, Caucasian and Low socioeconomic status  Loss Factors: NA  Historical Factors: Impulsivity  Risk Reduction Factors:   Positive social support, Positive therapeutic relationship and Positive coping skills or problem solving skills  Continued Clinical Symptoms:  Severe Anxiety and/or Agitation Depression:   Severe Alcohol/Substance Abuse/Dependencies  Cognitive Features That Contribute To Risk:  None    Suicide Risk:  Minimal: No identifiable suicidal ideation.  Patients presenting with no risk factors but with morbid ruminations; may be classified as minimal risk based on the severity of the depressive symptoms  Follow-up Information    [facility] Follow up on 10/07/2018.   Specialty:  Behavioral Health Why:  Appointment for medication management and therapy services is Monday, 10/07/18 at 11:00am with Tim. Please be sure to bring your hospital discharge paperwork and your medications.  [address] [place] [address] [phone]         Subjective Data:  Carlos French is a 27 y/o M with history of MDD, borderline personality disorder,  and alcohol use disorder who was admitted on IVC initiated by his family from [facility] where he was brought with worsening depression, SI to jump in front of  car, impulsivity such as jumping out of a moving vehicle, worsening use of alcohol and cannabis, and poor adherence to his outpatient medications. Pt has recent relevant history of discharge from [facility] on 09/18/18 with plan to go to [facility] for substance use treatment, but pt did not go. He was medically stabilized and started on alcohol withdrawal protocol with ativan. He was then transferred to [facility] for additional treatment and stabilization. He was started on trial of lithium, remeron, and zyprexa, and he was monitored on the alcohol withdrawal protocol. He reported incremental improvement of his presenting symptoms.    Today upon evaluation, pt shares, "I'm doing good." He denies any specific concerns. He is sleeping well. His appetite is good. He denies other physical complaints. He denies SI/HI/AH/VH. He is tolerating his medications well, and he is in agreement to continue his current regimen without changes. He plans to attend AA meetings and outpatient therapy for substance use treatment after discharge. He was able to engage in safety planning including plan to return to [facility] or contact emergency services if he feels unable to maintain his own safety or the safety of others. Pt had no further questions, comments, or concerns.    Plan Of Care/Follow-up recommendations:   -Discharge to outpatient level of care  -MDD, recurrent, severe, with psychosis   - Continue lithium carbonate 300 mg p.o. every 12 hours for mood stability   - Continue Remeron 15 mg p.o. nightly for mood stability and sleep   -Continue Zyprexa 10 mg p.o. nightly for mood stability  -Alcohol use disorder    -DC  Ativan detox protocol  -insomnia   -Continue Rozerem 8 mg p.o. nightly for insomnia   -Continue trazodone 50 mg p.o. nightly as needed for insomnia  -Anxiety   -Continue Vistaril 50 mg p.o. every 6 hours as needed for anxiety   Activity:  as tolerated Diet:  normal Tests:  NA Other:  see above for DC  plan  Micheal Likens, MD 10/03/2018, 9:11 AM

## 2018-10-03 NOTE — Plan of Care (Signed)
  Problem: Education: Goal: Mental status will improve Outcome: Adequate for Discharge   Problem: Education: Goal: Verbalization of understanding the information provided will improve Outcome: Adequate for Discharge   Problem: Activity: Goal: Interest or engagement in activities will improve Outcome: Adequate for Discharge

## 2018-10-03 NOTE — Progress Notes (Signed)
Discharge note: Patient reviewed discharge paperwork with RN including prescriptions, follow up appointments, and lab work. Patient given the opportunity to ask questions. All concerns were addressed. All belongings were returned to patient. Denied SI/HI/AVH. Patient thanked staff for their care while at the hospital.  Patient's girlfriend's mother was waiting in the lobby to pick him up.  Patient refused to fill out suicide safety plan.

## 2018-10-03 NOTE — Progress Notes (Signed)
  [facility] Adult Case Management Discharge Plan :  Will you be returning to the same living situation after discharge:  Yes,  patient reports he is returning home with his mother At discharge, do you have transportation home?: Yes,  patient reports his girlfriend's mother is picking him up at discharge Do you have the ability to pay for your medications: No.  Release of information consent forms completed and in the chart;  Patient's signature needed at discharge.  Patient to Follow up at: Follow-up Information    [facility] Follow up on 10/07/2018.   Specialty:  Behavioral Health Why:  Appointment for medication management and therapy services is Monday, 10/07/18 at 11:00am with Tim. Please be sure to bring your hospital discharge paperwork and your medications.  Contact information: [address] [place] [address] [phone]           Next level of care provider has access to [facility] Link:yes  Safety Planning and Suicide Prevention discussed: Yes,  with the patient  Have you used any form of tobacco in the last 30 days? (Cigarettes, Smokeless Tobacco, Cigars, and/or Pipes): Yes  Has patient been referred to the Quitline?: Patient refused referral  Patient has been referred for addiction treatment: Pt. refused referral  Maeola Sarah, LCSWA 10/03/2018, 9:19 AM

## 2018-10-03 NOTE — Progress Notes (Signed)
D: Pt was observed interacting with his peers. Informed the writer that he may be "discharging tomorrow". Stated, "that makes me happy". Pt stated he's to follow up at [facility]. Pt has no questions or concerns.    A:  Support and encouragement was offered. 15 min checks continued for safety.  R: Pt remains safe.

## 2018-10-03 NOTE — BHH Group Notes (Signed)
Adult Psychoeducational Group Note  Date:  10/03/2018 Time:  9:05 AM  Group Topic/Focus:  Goals Group:   The focus of this group is to help patients establish daily goals to achieve during treatment and discuss how the patient can incorporate goal setting into their daily lives to aide in recovery.  Participation Level:  Active  Participation Quality:  Appropriate  Affect:  Appropriate  Cognitive:  Alert  Insight: Appropriate  Engagement in Group:  Engaged  Modes of Intervention:  Orientation  Additional Comments:  Pt attended orientation/goals group facilitated by MHT Kevin Fenton B.  Dellia Nims 10/03/2018, 9:05 AM

## 2018-10-03 NOTE — Discharge Summary (Signed)
Physician Discharge Summary Note  Patient:  Carlos French is an 27 y.o., male MRN:  [id number] DOB:  06-May-1991 Patient phone:  [phone] (home)  Patient address:   [address] [place] [address],  Total Time spent with patient: 20 minutes  Date of Admission:  09/29/2018 Date of Discharge: 10/03/18  Reason for Admission:  Worsening depression with SI  Principal Problem: MDD (major depressive disorder), recurrent, severe, with psychosis [facility]) Discharge Diagnoses: Patient Active Problem List   Diagnosis Date Noted  . 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 PTSD (post-traumatic stress disorder) [F43.10] 09/16/2018  . MDD (major depressive disorder), recurrent severe, without psychosis (HCC) [F33.2] 11/15/2017  . MDD (major depressive disorder), recurrent, severe, with psychosis (HCC) [F33.3] 11/03/2017  . Depression [F32.9]   . Borderline personality disorder (HCC) [F60.3] 02/10/2016  . Substance induced mood disorder (HCC) [F19.94] 02/09/2016  . Alcohol withdrawal (HCC) [F10.239] 08/23/2015  . Tobacco use disorder [F17.200] 08/23/2015  . Cocaine use disorder, severe, dependence (HCC) [F14.20] 08/11/2015  . Alcohol use disorder, severe, dependence (HCC) [F10.20] 08/11/2015  . Cannabis use disorder, severe, dependence (HCC) [F12.20] 08/11/2015    Past Psychiatric History: -dx of MDD recurrent severe, borderline personality disorder, and alcohol use disorder - last inpt to [facility] in 09/18/18 and previously Nov 2018 - most recent outpt at [facility] - hx of multiple suicide attempt via overdose  Past Medical History:  Past Medical History:  Diagnosis Date  . Allergy   . Anxiety   . Bipolar 1 disorder (HCC)   . Depression   . GERD (gastroesophageal reflux disease)   . GI (gastrointestinal bleed)   . GI bleeding   . PTSD (post-traumatic stress disorder)   . Suicidal ideation     Past Surgical History:  Procedure Laterality Date  . NO PAST  SURGERIES     Family History:  Family History  Problem Relation Age of Onset  . Stroke Mother    Family Psychiatric  History: pt's mother has hx of bipolar disorder Social History:  Social History   Substance and Sexual Activity  Alcohol Use Yes  . Alcohol/week: 84.0 standard drinks  . Types: 84 Cans of beer per week   Comment: Daily     Social History   Substance and Sexual Activity  Drug Use No  . Types: Marijuana, Cocaine    Social History   Socioeconomic History  . Marital status: Significant Other    Spouse name: Not on file  . Number of children: Not on file  . Years of education: Not on file  . Highest education level: Not on file  Occupational History  . Not on file  Social Needs  . Financial resource strain: Not on file  . Food insecurity:    Worry: Not on file    Inability: Not on file  . Transportation needs:    Medical: Not on file    Non-medical: Not on file  Tobacco Use  . Smoking status: Current Every Day Smoker    Packs/day: 2.00    Types: Cigarettes  . Smokeless tobacco: Former Engineer, water and Sexual Activity  . Alcohol use: Yes    Alcohol/week: 84.0 standard drinks    Types: 84 Cans of beer per week    Comment: Daily  . Drug use: No    Types: Marijuana, Cocaine  . Sexual activity: Yes    Birth control/protection: None  Lifestyle  . Physical activity:    Days per  week: Not on file    Minutes per session: Not on file  . Stress: Not on file  Relationships  . Social connections:    Talks on phone: Not on file    Gets together: Not on file    Attends religious service: Not on file    Active member of club or organization: Not on file    Attends meetings of clubs or organizations: Not on file    Relationship status: Not on file  Other Topics Concern  . Not on file  Social History Narrative  . Not on file    Hospital Course:   09/30/18 [facility] MD Assessment: 27 y/o M with history of MDD, borderline personality disorder, and alcohol  use disorder who was admitted on [facility] where he was brought with worsening depression, SI to jump in front of car, impulsivity such as jumping out of a moving vehicle, worsening use of alcohol and cannabis, and poor adherence to his outpatient medications. Pt has recent relevant history of discharge from [facility] on 09/18/18 with plan to go to [facility] for substance use treatment, but pt did not go. He was medically stabilized and started on alcohol withdrawal protocol with ativan. He was then transferred to [facility] for additional treatment and stabilization. Upon initial interview, pt shares, "I stayed sober for five days, and then my medications stopped working. I hadn't slept, so I started drinking to sedate myself, and things just kept getting worse." Pt endorses significant depressed mood, initial insomnia, anhedonia, guilty feelings, low energy, poor concentration, and fluctuant appetite. He endorses ongoing SI with plan to jump into traffic, but he is able to contract for safety while in the hospital. He denies HI. He endorses AH of multiple voices which say demeaning things. He endorses VH of seeing "shadows" He denies symptoms of mania, OCD, and PTSD. He has been drinking about 12 beers per day and using 0.5 grams of concentrated cannabis daily. He denies other illicit substance use. Discussed with patient about treatment options. He requests specifically to address his impulsivity and mood swings, and we discussed a mood stabilizer such as depakote - and pt reports previous trial of depakote for about 6 months, and he did not notice significant benefit. We discussed trial of lithium and pt was in agreement. He also felt that seroquel could have helped more, so we instead discussed trial of zyprexa. Pt will be restarted on remeron for depression and insomnia and we will also attempt trial of rozerem. Pt expresses that he would like referral to outpatient substance use treatment, and  he will discuss options with the SW team. Pt was in agreement with the above plan, and he had no further questions, comments, or concerns.  Patient remained on the [facility] unit for 3 days. The patient stabilized on medication and therapy. Patient was discharged on Vistaril 50 mg Q6H PRN, Lithobid 300 mg Q12H, Remeron 15 mg QHS, Zyprexa 10 mg QHS, Rozerem 8 mg QHS, and Trazodone 50 mg QHS PRN. Patient has shown improvement with improved mood, affect, sleep, appetite, and interaction. Patient has attended group and participated. Patient has been seen in the day room interacting with peers and staff appropriately. Patient denies any SI/HI/AVH and contracts for safety. Patient agrees to follow up at [facility]. Patient is provided with prescriptions for their medications upon discharge.  Physical Findings: AIMS: Facial and Oral Movements Muscles of Facial Expression: None, normal Lips and Perioral Area: None, normal Jaw: None, normal  Tongue: None, normal,Extremity Movements Upper (arms, wrists, hands, fingers): None, normal Lower (legs, knees, ankles, toes): None, normal, Trunk Movements Neck, shoulders, hips: None, normal, Overall Severity Severity of abnormal movements (highest score from questions above): None, normal Incapacitation due to abnormal movements: None, normal Patient's awareness of abnormal movements (rate only patient's report): No Awareness, Dental Status Current problems with teeth and/or dentures?: No Does patient usually wear dentures?: No  CIWA:  CIWA-Ar Total: 0 COWS:  COWS Total Score: 3  Musculoskeletal: Strength & Muscle Tone: within normal limits Gait & Station: normal Patient leans: N/A  Psychiatric Specialty Exam: Physical Exam  Nursing note and vitals reviewed. Constitutional: He is oriented to person, place, and time. He appears well-developed and well-nourished.  Cardiovascular: Normal rate.  Respiratory: Effort normal.  Musculoskeletal: Normal range of motion.   Neurological: He is alert and oriented to person, place, and time.  Skin: Skin is warm.    Review of Systems  Constitutional: Negative.   HENT: Negative.   Eyes: Negative.   Respiratory: Negative.   Cardiovascular: Negative.   Gastrointestinal: Negative.   Genitourinary: Negative.   Musculoskeletal: Negative.   Skin: Negative.   Neurological: Negative.   Endo/Heme/Allergies: Negative.   Psychiatric/Behavioral: Negative.     Blood pressure 120/86, pulse 80, temperature 97.6 F (36.4 C), temperature source Oral, resp. rate 16, height 5' 4.5" (1.638 m), weight 49.9 kg.Body mass index is 18.59 kg/m.  General Appearance: Casual  Eye Contact:  Good  Speech:  Clear and Coherent and Normal Rate  Volume:  Normal  Mood:  Euthymic  Affect:  Congruent  Thought Process:  Goal Directed and Descriptions of Associations: Intact  Orientation:  Full (Time, Place, and Person)  Thought Content:  WDL  Suicidal Thoughts:  No  Homicidal Thoughts:  No  Memory:  Immediate;   Good Recent;   Good Remote;   Good  Judgement:  Fair  Insight:  Fair  Psychomotor Activity:  Normal  Concentration:  Concentration: Good and Attention Span: Good  Recall:  Good  Fund of Knowledge:  Good  Language:  Good  Akathisia:  No  Handed:  Right  AIMS (if indicated):     Assets:  Communication Skills Desire for Improvement Financial Resources/Insurance Housing Physical Health Social Support Transportation  ADL's:  Intact  Cognition:  WNL  Sleep:  Number of Hours: 6     Have you used any form of tobacco in the last 30 days? (Cigarettes, Smokeless Tobacco, Cigars, and/or Pipes): Yes  Has this patient used any form of tobacco in the last 30 days? (Cigarettes, Smokeless Tobacco, Cigars, and/or Pipes) Yes, Yes, A prescription for an FDA-approved tobacco cessation medication was offered at discharge and the patient refused  Blood Alcohol level:  Lab Results  Component Value Date   ETH 154 (H) 09/26/2018    ETH 224 (H) 09/15/2018    Metabolic Disorder Labs:  Lab Results  Component Value Date   HGBA1C 4.6 02/10/2016   Lab Results  Component Value Date   PROLACTIN 32.3 (H) 02/10/2016   Lab Results  Component Value Date   CHOL 209 (H) 02/10/2016   TRIG 120 02/10/2016   HDL 86 02/10/2016   CHOLHDL 2.4 02/10/2016   VLDL 24 02/10/2016   LDLCALC 99 02/10/2016   LDLCALC 100 (H) 08/12/2015    See Psychiatric Specialty Exam and Suicide Risk Assessment completed by Attending Physician prior to discharge.  Discharge destination:  Home  Is patient on multiple antipsychotic therapies at discharge:  No  Has Patient had three or more failed trials of antipsychotic monotherapy by history:  No  Recommended Plan for Multiple Antipsychotic Therapies: NA   Allergies as of 10/03/2018      Reactions   Acetaminophen Other (See Comments)   CANNOT TAKE (causes stomach bleeding)   Ibuprofen    History of GI bleed      Medication List    STOP taking these medications   acetaminophen 500 MG tablet Commonly known as:  TYLENOL   nicotine polacrilex 4 MG gum Commonly known as:  NICORETTE   pantoprazole 40 MG tablet Commonly known as:  PROTONIX   QUEtiapine 200 MG tablet Commonly known as:  SEROQUEL   QUEtiapine 50 MG tablet Commonly known as:  SEROQUEL     TAKE these medications     Indication  hydrOXYzine 50 MG tablet Commonly known as:  ATARAX/VISTARIL Take 1 tablet (50 mg total) by mouth every 6 (six) hours as needed for anxiety. What changed:    medication strength  how much to take  Indication:  Feeling Anxious   lithium carbonate 300 MG CR tablet Commonly known as:  LITHOBID Take 1 tablet (300 mg total) by mouth every 12 (twelve) hours. For mood control  Indication:  mood stability   mirtazapine 15 MG tablet Commonly known as:  REMERON Take 1 tablet (15 mg total) by mouth at bedtime. For mood control What changed:  additional instructions  Indication:  mood  stability   OLANZapine 10 MG tablet Commonly known as:  ZYPREXA Take 1 tablet (10 mg total) by mouth at bedtime. For mood control  Indication:  mood stability   ramelteon 8 MG tablet Commonly known as:  ROZEREM Take 1 tablet (8 mg total) by mouth at bedtime. For insomnia  Indication:  Trouble Sleeping   traZODone 50 MG tablet Commonly known as:  DESYREL Take 1 tablet (50 mg total) by mouth at bedtime as needed for sleep. What changed:    medication strength  how much to take  when to take this  reasons to take this  additional instructions  Indication:  Trouble Sleeping      Follow-up Information    [facility] Follow up on 10/07/2018.   Specialty:  Behavioral Health Why:  Appointment for medication management and therapy services is Monday, 10/07/18 at 11:00am with Tim. Please be sure to bring your hospital discharge paperwork and your medications.  Contact [address] [place] [address] [phone]           Follow-up recommendations:  Continue activity as tolerated. Continue diet as recommended by your PCP. Ensure to keep all appointments with outpatient providers.  Comments:  Patient is instructed prior to discharge to: Take all medications as prescribed by his/her mental healthcare provider. Report any adverse effects and or reactions from the medicines to his/her outpatient provider promptly. Patient has been instructed & cautioned: To not engage in alcohol and or illegal drug use while on prescription medicines. In the event of worsening symptoms, patient is instructed to call the crisis hotline, 911 and or go to the nearest ED for appropriate evaluation and treatment of symptoms. To follow-up with his/her primary care provider for your other medical issues, concerns and or health care needs.    Signed: Gerlene Burdock Money, FNP 10/03/2018, 8:20 AM

## 2018-10-05 ENCOUNTER — Emergency Department (HOSPITAL_COMMUNITY)
Admission: EM | Admit: 2018-10-05 | Discharge: 2018-10-05 | Disposition: A | Discharge status: Home / Self Care | Payer: Medicaid Other | Attending: Emergency Medicine | Admitting: Emergency Medicine

## 2018-10-05 ENCOUNTER — Other Ambulatory Visit: Payer: Self-pay

## 2018-10-05 ENCOUNTER — Encounter (HOSPITAL_COMMUNITY): Payer: Self-pay | Admitting: Emergency Medicine

## 2018-10-05 DIAGNOSIS — Z79899 Other long term (current) drug therapy: Secondary | ICD-10-CM | POA: Insufficient documentation

## 2018-10-05 DIAGNOSIS — F17210 Nicotine dependence, cigarettes, uncomplicated: Secondary | ICD-10-CM | POA: Insufficient documentation

## 2018-10-05 DIAGNOSIS — L630 Alopecia (capitis) totalis: Secondary | ICD-10-CM | POA: Insufficient documentation

## 2018-10-05 DIAGNOSIS — R42 Dizziness and giddiness: Secondary | ICD-10-CM

## 2018-10-05 DIAGNOSIS — K1370 Unspecified lesions of oral mucosa: Secondary | ICD-10-CM | POA: Insufficient documentation

## 2018-10-05 DIAGNOSIS — I951 Orthostatic hypotension: Secondary | ICD-10-CM

## 2018-10-05 LAB — CBC
HCT: 42.2 % (ref 39.0–52.0)
Hemoglobin: 13.9 g/dL (ref 13.0–17.0)
MCH: 32.4 pg (ref 26.0–34.0)
MCHC: 32.9 g/dL (ref 30.0–36.0)
MCV: 98.4 fL (ref 80.0–100.0)
NRBC: 0 % (ref 0.0–0.2)
PLATELETS: 263 10*3/uL (ref 150–400)
RBC: 4.29 MIL/uL (ref 4.22–5.81)
RDW: 13.5 % (ref 11.5–15.5)
WBC: 9 10*3/uL (ref 4.0–10.5)

## 2018-10-05 LAB — BASIC METABOLIC PANEL
ANION GAP: 3 — AB (ref 5–15)
BUN: 10 mg/dL (ref 6–20)
CALCIUM: 8.8 mg/dL — AB (ref 8.9–10.3)
CHLORIDE: 110 mmol/L (ref 98–111)
CO2: 26 mmol/L (ref 22–32)
CREATININE: 0.89 mg/dL (ref 0.61–1.24)
GFR calc non Af Amer: >60 mL/min (ref >60)
Glucose, Bld: 129 mg/dL — ABNORMAL HIGH (ref 70–99)
POTASSIUM: 3.9 mmol/L (ref 3.5–5.1)
SODIUM: 139 mmol/L (ref 135–145)

## 2018-10-05 LAB — URINALYSIS, ROUTINE W REFLEX MICROSCOPIC
Bilirubin Urine: NEGATIVE
Glucose, UA: NEGATIVE mg/dL
Hgb urine dipstick: NEGATIVE
KETONES UR: NEGATIVE mg/dL
LEUKOCYTES UA: NEGATIVE
NITRITE: NEGATIVE
PROTEIN: NEGATIVE mg/dL
Specific Gravity, Urine: 1.025 (ref 1.005–1.030)
pH: 7 (ref 5.0–8.0)

## 2018-10-05 LAB — CBG MONITORING, ED: Glucose-Capillary: 129 mg/dL — ABNORMAL HIGH (ref 70–99)

## 2018-10-05 MED ORDER — SODIUM CHLORIDE 0.9 % IV BOLUS: 1000.0000 mL | Freq: Once | INTRAVENOUS | Status: DC

## 2018-10-05 MED ORDER — SODIUM CHLORIDE 0.9 % IV BOLUS
1000.0000 mL | Freq: Once | INTRAVENOUS | Status: AC
  Administered 2018-10-05: 1000 mL via INTRAVENOUS

## 2018-10-05 NOTE — Discharge Instructions (Addendum)
Please return to the Emergency Department for any new or worsening symptoms or if your symptoms do not improve. Please be sure to follow up with your Primary Care Physician as soon as possible regarding your visit today. If you do not have a Primary Doctor please use the resources below to establish one. It is possible that your dizziness today is due to your recent medication changes and dehydration.  Please follow-up with your primary care doctor and behavioral health specialists as soon as possible for medication management.  Please be sure to drink plenty water and get plenty of rest. Please go to your appointment at [facility] on Monday for reevaluation.  Contact a health care provider if: You vomit. You have diarrhea. You have a fever for more than 2-3 days. You feel more thirsty than usual. You feel weak and tired. Get help right away if: You have chest pain. You have a fast or irregular heartbeat. You develop numbness in any part of your body. You cannot move your arms or your legs. You have trouble speaking. You become sweaty or feel lightheaded. You faint. You feel short of breath. You have trouble staying awake. You feel confused. Contact a doctor if: Your dizziness does not go away. Your dizziness or light-headedness gets worse. You feel sick to your stomach (nauseous). You have trouble hearing. You have new symptoms. You are unsteady on your feet. You feel like the room is spinning. Get help right away if: You throw up (vomit) or have watery poop (diarrhea), and you cannot eat or drink anything. You have trouble: Talking. Walking. Swallowing. Using your arms, hands, or legs. You feel generally weak. You are not thinking clearly, or you have trouble forming sentences. A friend or family member may notice this. You have: Chest pain. Pain in your belly (abdomen). Shortness of breath. Sweating. Your vision changes. You are bleeding. You have a very bad  headache. You have neck pain or a stiff neck. You have a fever.   Do not take your medicine if  develop an itchy rash, swelling in your mouth or lips, or difficulty breathing.   RESOURCE GUIDE  Chronic Pain Problems: Contact [facility]  [phone] Patients need to be referred by their primary care doctor.  Insufficient Money for Medicine: Contact United Way:  call "211" or [facility] (929)223-6920.  No Primary Care Doctor: Call Health Connect  [phone] - can help you locate a primary care doctor that  accepts your insurance, provides certain services, etc. Physician Referral [phone]  Agencies that provide inexpensive medical care: [facility]  [phone] [facility]  [phone] [facility]  [phone] [facility]  [phone] Planned Parenthood  [phone] [facility]  [phone]  Medicaid-accepting [facility] Providers: [facility]- [address]  [phone], Mon-Fri 9am-7pm, Sat 9am-1pm [facility]- [address] [place], Suite [place]  [phone] [facility]- [address], Suite [place]  [phone] [facility]- [address]  [phone] Renaye Rakers- [address] [place], [address], [phone]  Only accepts [place] patients after they have their name  applied to their card  Self Pay (no insurance) in [facility]: Sickle Cell Patients: Dr Willey Blade, [facility]  [address] [place], [phone] [facility]- [address] [place]  [phone]       -  [facility] [place]- [address]       -     [facility]- see information above (Speak to [employer] if you do not have insurance)       -  [facility]- [address] [place], [phone]       -  [facility]- [address] [place],  [phone]       -  [facility]- [address] [phone]       -  Dr Julio Sicks-  [address], [place], [phone]       -  [facility]- [address], [phone]       -  [facility]- [address] [phone], also [address], [phone]       -    [facility]- [address] [place], [phone], 1st & 3rd Saturday   every month, 10am-1pm  1) Find a Doctor and Pay Out of Pocket Although you won't have to find out who is covered by your insurance plan, it is a good idea to ask around and get recommendations. You will then need to call the office and see if the doctor you have chosen will accept you as a new patient and what types of options they offer for patients who are self-pay. Some doctors offer discounts or will set up payment plans for their patients who do not have insurance, but you will need to ask so you aren't surprised when you get to your appointment.  2) Contact Your Local Health Department Not all health departments have doctors that can see patients for sick visits, but many do, so it is worth a call to see if yours does. If you don't know where your local health department is, you can check in your phone book. The CDC also has a tool to help you locate your state's health department, and many state websites also have listings of all of their local health departments.  3) Find a Walk-in Clinic If your illness is not likely to be very severe or complicated, you may want to try a walk in clinic. These are popping up all over the country in pharmacies, drugstores, and shopping centers. They're usually staffed by nurse practitioners or physician assistants that have been trained to treat common illnesses and complaints. They're usually fairly quick and inexpensive. However, if you have serious medical issues or chronic medical problems, these are probably not your best option  STD Testing [facility] Department of [facility]  [place], STD Clinic, [address] [place], phone [phone] or [phone].  Monday - Friday, call for an appointment. [facility] Department of [employer], STD Clinic, [place] [address], [place], phone [phone] or [phone].  Monday - Friday, call for an appointment.  Abuse/Neglect: [facility] Child Abuse Hotline 8603227801 [facility] Child Abuse Hotline 717-444-3129 (After Hours)  Emergency Shelter:  [facility] 508 146 2501  Maternity Homes: Room at the [place] [facility] 516-323-7504 [facility] ([phone]  MRSA Hotline #:   403 586 8065  [facility] Resources  [facility] [place]  United Way [facility] Dept. [address]                 [address]         [address] [place] [address] [place]  Blondell Reveal Phone:  [phone]                                  Phone:  [phone]                   Phone:  [phone]  [facility], [phone] [facility] [place]- [phone]       -     [facility] in [place], [address]                                  [phone], [facility] Child Abuse Hotline 774-639-7149 or (418) 173-0191 (After Hours)   Behavioral Health Services  Substance Abuse Resources: [facility] and Drug Services  [phone] [facility] [phone] [facility] ([phone] Floydene Flock [phone] Residential & Outpatient Substance Abuse Program  [phone]  Psychological Services: [facility]  [phone] [facility] Services  [phone] [facility], [phone] [place]. [address], [place], ACCESS LINE: (918)535-8508 or [phone], [employer]za  Dental Assistance  If unable to pay  or uninsured, contact:  [facility] or [facility]. to become qualified for the adult dental clinic.  Patients with Medicaid: [facility] [phone] [address], [phone] [address], [phone]  If unable to pay, or uninsured, contact [facility] (409)002-0633) or [facility] Department [phone] in [place], [phone] in [facility] to become qualified for the adult dental clinic   Other Low-Cost Community Dental Services: [facility]- [address] [place], [place], [place], [address], [phone], Ext. [phone], 2nd and 4th Thursday of the month at 6:30am.  10 clients each day by appointment, can sometimes see walk-in patients if someone does not show for an appointment. [facility]- [address] [place] [address] [place], [place], [address], [phone] [facility] [address] [place], [place], [address], [phone] [facility] Health Department- [phone] [facility] Health Department- [phone] [facility] [phone]
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
## 2018-10-05 NOTE — ED Notes (Addendum)
Temp 98.7 

## 2018-10-05 NOTE — ED Notes (Signed)
Pt's girlfriend in room with list of new meds and side effects.

## 2018-10-05 NOTE — ED Provider Notes (Signed)
Assumed care of patient from Harlene Salts, [place], see his note for complete H&P. Patient presents today with complaint of dizzinesss after starting new psych meds. Labs and EKG unremarkable. Orthostatic VS + after NS, plan is to complete fluid bolus, recheck orthostatics, may dc if vitals improve and patient feeling well. Physical Exam  BP 112/68   Pulse 80   Resp (!) 21   Ht 5\' 6"  (1.676 m)   Wt 52.6 kg   SpO2 98%   BMI 18.72 kg/m   Physical Exam  ED Course/Procedures   Clinical Course as of Oct 05 1646  Sat Oct 05, 2018  1604 Discussed with Dr. Effie Shy. Pending ortho vital signs and urinalysis, discharge with PCP follow-up.   [BM]    Clinical Course User Index [BM] Bill Salinas, PA-C    Procedures  MDM  Patient requests discharge, states feeling better, no longer symptomatic. Plan is to follow up as previously scheduled.       Jeannie Fend, PA-C 10/05/18 2111    Terrilee Files, MD 10/06/18 1018

## 2018-10-05 NOTE — ED Notes (Signed)
Pt states he wants to leave and go home.
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
## 2018-10-05 NOTE — ED Notes (Signed)
Patient verbalizes understanding of discharge instructions. Opportunity for questioning and answers were provided. Armband removed by staff, pt discharged from ED.  

## 2018-10-05 NOTE — ED Notes (Signed)
Pt refused 2nd bag of fluid and said he isn't dizzy now. Pt stood at bedside with this RN with no pain or dizziness.

## 2018-10-05 NOTE — ED Notes (Signed)
Pt. Stated that he was dizzy after providing standing for standing portion of orthostatic vs.

## 2018-10-07 ENCOUNTER — Ambulatory Visit (HOSPITAL_COMMUNITY)
Admission: AD | Admit: 2018-10-07 | Discharge: 2018-10-07 | Disposition: A | Discharge status: Home / Self Care | Payer: Federal, State, Local not specified - Other | Source: Intra-hospital | Attending: Psychiatry | Admitting: Psychiatry

## 2018-10-07 DIAGNOSIS — K219 Gastro-esophageal reflux disease without esophagitis: Secondary | ICD-10-CM | POA: Insufficient documentation

## 2018-10-07 DIAGNOSIS — F4310 Post-traumatic stress disorder, unspecified: Secondary | ICD-10-CM | POA: Insufficient documentation

## 2018-10-07 DIAGNOSIS — F17210 Nicotine dependence, cigarettes, uncomplicated: Secondary | ICD-10-CM | POA: Insufficient documentation

## 2018-10-07 DIAGNOSIS — F419 Anxiety disorder, unspecified: Secondary | ICD-10-CM | POA: Insufficient documentation

## 2018-10-07 DIAGNOSIS — F314 Bipolar disorder, current episode depressed, severe, without psychotic features: Secondary | ICD-10-CM | POA: Insufficient documentation

## 2018-10-07 DIAGNOSIS — R45851 Suicidal ideations: Secondary | ICD-10-CM | POA: Insufficient documentation

## 2018-10-07 NOTE — H&P (Signed)
Behavioral Health Medical Screening Exam  Carlos French is an 27 y.o. male.  Total Time spent with patient: 30 minutes  Psychiatric Specialty Exam: Physical Exam  Constitutional: He is oriented to person, place, and time. He appears well-developed and well-nourished. No distress.  HENT:  Head: Normocephalic and atraumatic.  Right Ear: External ear normal.  Left Ear: External ear normal.  Eyes: Pupils are equal, round, and reactive to light. Right eye exhibits no discharge. Left eye exhibits no discharge.  Respiratory: Effort normal. No respiratory distress.  Musculoskeletal: Normal range of motion.  Neurological: He is alert and oriented to person, place, and time.  Skin: Skin is warm and dry. He is not diaphoretic.  Psychiatric: His mood appears anxious. His speech is slurred. He is not withdrawn and not actively hallucinating. Thought content is not paranoid and not delusional. Cognition and memory are normal. He expresses impulsivity and inappropriate judgment. He exhibits a depressed mood. He expresses suicidal ideation. He expresses no homicidal ideation. He expresses suicidal plans.    Review of Systems  Constitutional: Negative for chills, fever and weight loss.  Psychiatric/Behavioral: Positive for depression, substance abuse and suicidal ideas. Negative for hallucinations and memory loss. The patient is nervous/anxious and has insomnia.       General Appearance: Casual and Fairly Groomed  Eye Contact:  Fair  Speech:  Slurred  Volume:  Decreased  Mood:  Anxious, Depressed, Dysphoric, Hopeless, Irritable and Worthless  Affect:  Congruent and Depressed  Thought Process:  Coherent, Goal Directed and Descriptions of Associations: Intact  Orientation:  Full (Time, Place, and Person)  Thought Content:  Logical and Hallucinations: None  Suicidal Thoughts:  Yes.  with intent/plan  Homicidal Thoughts:  No  Memory:  Immediate;   Fair Recent;   Fair  Judgement:  Impaired   Insight:  Fair  Psychomotor Activity:  Normal  Concentration: Concentration: Fair and Attention Span: Fair  Recall:  [employer] of Knowledge:Good  Language: Good  Akathisia:  No  Handed:  Right  AIMS (if indicated):     Assets:  Communication Skills Desire for Improvement Financial Resources/Insurance Housing Intimacy Leisure Time Physical Health Transportation  Sleep:       Musculoskeletal: Strength & Muscle Tone: within normal limits Gait & Station: normal   Recommendations:  Based on my evaluation the patient does not appear to have an emergency medical condition.  Patient states that he has been drinking beer all days. Appears to be intoxicated. Patient has had 2 recent inpatient admissions to [facility]. Recommend overnight observation and reevaluation in the AM.   Jackelyn Poling, NP 10/07/2018, 11:42 PM

## 2018-10-08 ENCOUNTER — Emergency Department (HOSPITAL_COMMUNITY)
Admission: EM | Admit: 2018-10-08 | Discharge: 2018-10-08 | Disposition: A | Discharge status: Home / Self Care | Payer: Medicaid Other | Attending: Emergency Medicine | Admitting: Emergency Medicine

## 2018-10-08 DIAGNOSIS — F603 Borderline personality disorder: Secondary | ICD-10-CM | POA: Insufficient documentation

## 2018-10-08 DIAGNOSIS — Z79899 Other long term (current) drug therapy: Secondary | ICD-10-CM | POA: Insufficient documentation

## 2018-10-08 DIAGNOSIS — F1020 Alcohol dependence, uncomplicated: Secondary | ICD-10-CM | POA: Diagnosis present

## 2018-10-08 DIAGNOSIS — F4310 Post-traumatic stress disorder, unspecified: Secondary | ICD-10-CM | POA: Insufficient documentation

## 2018-10-08 DIAGNOSIS — F319 Bipolar disorder, unspecified: Secondary | ICD-10-CM | POA: Insufficient documentation

## 2018-10-08 DIAGNOSIS — F1019 Alcohol abuse with unspecified alcohol-induced disorder: Secondary | ICD-10-CM | POA: Insufficient documentation

## 2018-10-08 DIAGNOSIS — F1010 Alcohol abuse, uncomplicated: Secondary | ICD-10-CM | POA: Insufficient documentation

## 2018-10-08 DIAGNOSIS — F1220 Cannabis dependence, uncomplicated: Secondary | ICD-10-CM

## 2018-10-08 DIAGNOSIS — R45851 Suicidal ideations: Secondary | ICD-10-CM | POA: Insufficient documentation

## 2018-10-08 DIAGNOSIS — F17210 Nicotine dependence, cigarettes, uncomplicated: Secondary | ICD-10-CM | POA: Insufficient documentation

## 2018-10-08 LAB — RAPID URINE DRUG SCREEN, HOSP PERFORMED
Amphetamines: NOT DETECTED
BARBITURATES: NOT DETECTED
Benzodiazepines: NOT DETECTED
COCAINE: NOT DETECTED
Opiates: NOT DETECTED
Tetrahydrocannabinol: POSITIVE — AB

## 2018-10-08 LAB — COMPREHENSIVE METABOLIC PANEL
ALT: 47 U/L — ABNORMAL HIGH (ref 0–44)
ANION GAP: 10 (ref 5–15)
AST: 29 U/L (ref 15–41)
Albumin: 4 g/dL (ref 3.5–5.0)
Alkaline Phosphatase: 56 U/L (ref 38–126)
BILIRUBIN TOTAL: 0.5 mg/dL (ref 0.3–1.2)
BUN: 12 mg/dL (ref 6–20)
CO2: 25 mmol/L (ref 22–32)
Calcium: 8.8 mg/dL — ABNORMAL LOW (ref 8.9–10.3)
Chloride: 107 mmol/L (ref 98–111)
Creatinine, Ser: 0.87 mg/dL (ref 0.61–1.24)
GFR calc Af Amer: >60 mL/min (ref >60)
GFR calc non Af Amer: >60 mL/min (ref >60)
Glucose, Bld: 96 mg/dL (ref 70–99)
POTASSIUM: 4.1 mmol/L (ref 3.5–5.1)
Sodium: 142 mmol/L (ref 135–145)
TOTAL PROTEIN: 7 g/dL (ref 6.5–8.1)

## 2018-10-08 LAB — CBC
HEMATOCRIT: 41.7 % (ref 39.0–52.0)
Hemoglobin: 13.8 g/dL (ref 13.0–17.0)
MCH: 32.1 pg (ref 26.0–34.0)
MCHC: 33.1 g/dL (ref 30.0–36.0)
MCV: 97 fL (ref 80.0–100.0)
Platelets: 299 10*3/uL (ref 150–400)
RBC: 4.3 MIL/uL (ref 4.22–5.81)
RDW: 13.9 % (ref 11.5–15.5)
WBC: 7.8 10*3/uL (ref 4.0–10.5)
nRBC: 0 % (ref 0.0–0.2)

## 2018-10-08 LAB — SALICYLATE LEVEL: Salicylate Lvl: <7 mg/dL (ref 2.8–30.0)

## 2018-10-08 LAB — ACETAMINOPHEN LEVEL: Acetaminophen (Tylenol), Serum: <10 ug/mL — ABNORMAL LOW (ref 10–30)

## 2018-10-08 LAB — ETHANOL: ALCOHOL ETHYL (B): 147 mg/dL — AB (ref <10)

## 2018-10-08 MED ORDER — ONDANSETRON 4 MG PO TBDP
4.0000 mg | ORAL_TABLET | Freq: Once | ORAL | Status: AC
  Administered 2018-10-08: 4 mg via ORAL
  Filled 2018-10-08: qty 1

## 2018-10-08 NOTE — ED Notes (Signed)
Pt was asked for a urine sample. When he was walking back to his room he was heard saying "shut up mother fucker" to staff
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
## 2018-10-08 NOTE — Discharge Instructions (Signed)
For your mental health needs, you are advised to follow up with [facility].  New and returning patients are seen at their walk-in clinic.  Walk-in hours are Monday - Friday from 8:00 am - 3:00 pm.  Walk-in patients are seen on a first come, first served basis.  Try to arrive as early as possible for he best chance of being seen the same day: ° °     [facility] °     [address] °     [place],  [address] °     [phone] °
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
## 2018-10-08 NOTE — BH Assessment (Signed)
[facility] Assessment Progress Note  Per Jacqueline [name], DO, this pt does not require psychiatric hospitalization at this time.  Pt is to be discharged from [facility] with recommendation to follow up with [facility].  This has been included in pt's discharge instructions.  Pt would also benefit from seeing Peer Support Specialists; they will be asked to speak to pt.  Pt's nurse, Cynthia, has been notified.  Carlos Mcnelly, MA Triage Specialist [phone]     

## 2018-10-08 NOTE — ED Notes (Signed)
Patient had another episode of vomiting this morning.  Dr. Ranae Palms notified and zofran ordered.

## 2018-10-08 NOTE — ED Triage Notes (Signed)
Pt here from [facility] with SI. Pt is not participating in assessment. And is refusing to answer other questions except that he drank 12 beers today

## 2018-10-08 NOTE — ED Notes (Signed)
Patient calling for a ride.

## 2018-10-08 NOTE — Patient Outreach (Signed)
ED Peer Support Specialist Patient Intake (Complete at intake & 30-60 Day Follow-up)  Name: Carlos French  MRN: [id number]  Age: 27 y.o.   Date of Admission: 10/08/2018  Intake: Initial Comments:      Primary Reason Admitted: poly substance use with alcohol/marijuana, SI, depression  Lab values: Alcohol/ETOH: Positive Positive UDS? Yes Amphetamines: No Barbiturates: No Benzodiazepines: No Cocaine: No Opiates: No Cannabinoids: Yes  Demographic information: Gender: Male Ethnicity: White Marital Status: Single Insurance Status: Medicaid(Medicaid Psychologist, educational) Ecologist (Work Neurosurgeon, Physicist, medical, Social research officer, government.: Yes(Food Public librarian) Lives with: Parent Living situation: House/Apartment  Reported Patient History: Patient reported health conditions: Anxiety disorders, Depression, Trauma, Other (comment)(PTSD) Patient aware of HIV and hepatitis status: Yes (comment)(HIV negative)  In past year, has patient visited ED for any reason? Yes  Number of ED visits: 5  Reason(s) for visit: SI, depression, poly substance use with alcohol/marijuana  In past year, has patient been hospitalized for any reason? Yes  Number of hospitalizations: 3  Reason(s) for hospitalization: poly substance use with alcohol/marijuana, SI, depression at [facility] 11/03/17, 09/15/18, and 09/29/18  In past year, has patient been arrested? No  Number of arrests:    Reason(s) for arrest:    In past year, has patient been incarcerated? No  Number of incarcerations:    Reason(s) for incarceration:    In past year, has patient received medication-assisted treatment? No  In past year, patient received the following treatments: Group therapy, Peer support group(Peer support groups at the [facility])  In past year, has patient received any harm reduction services? No  Did this include any of the following?    In past year, has patient received care from a mental  health provider for diagnosis other than SUD? No  In past year, is this first time patient has overdosed? (has not overdosed)  Number of past overdoses:    In past year, is this first time patient has been hospitalized for an overdose? (has not overdosed)  Number of hospitalizations for overdose(s):    Is patient currently receiving treatment for a mental health diagnosis? No  Patient reports experiencing difficulty participating in SUD treatment: No    Most important reason(s) for this difficulty?    Has patient received prior services for treatment? Yes(substance use treatment in [place] in the past)  In past, patient has received services from following agencies:    Plan of Care:  Suggested follow up at these agencies/treatment centers: (Patient is interested in getting connected to detox treatment facility for his alcohol use. [facility] will help with hose resources.)  Other information: [facility] met with the patient to provide substance use recovery support and help with recovery resources. CPSS tried an [facility] referral for detox, but they do not have any beds available today. Patient did not want a referral to [facility]. CPSS talked to the patient about a detox facility in [facility] and provided information for their program. Patient has family supports to help with transportation to [facility]. CPSS provided the patient with other substance use recovery resources including residential/outpatient substance use treatment center list, AA meeting list, and [facility] contact information. CPSS strongly encouraged the patient to follow up with [facility] for further help with getting connected with recovery resources after discharge from the [facility]. CPSS also informed the patient to no hesitate to contact [facility] after discharge for substance use recovery support from [facility].     Mason Jim, CPSS  10/08/2018 12:19 PM

## 2018-10-08 NOTE — ED Provider Notes (Signed)
[facility]-EMERGENCY DEPT Provider Note   CSN: [id number] Arrival date & time: 10/08/18  0032     History   Chief Complaint Chief Complaint  Patient presents with  . Suicidal    HPI Carlos French is a 27 y.o. male.  The history is provided by medical records and the patient.     27 year old male with history of allergies, anxiety, bipolar disorder, depression, GERD, PTSD, polysubstance abuse, borderline personality disorder, presenting to the ED from behavioral health for medical clearance, overnight observation and reassessment by psychiatry in the morning.    Patient generally uncooperative here, will not answer questions when asked and has been cursing at staff.  He does report drinking 12 beers today. He appears intoxicated currently. Majority of the history is obtained from behavioral health notes from earlier this evening.  Apparently he was brought to behavioral health by his girlfriend who had concerns about some statements he was making.  He has had some ongoing SI, relapsed on alcohol today.  Has had multiple prior psychiatric hospitalizations.  Past Medical History:  Diagnosis Date  . Allergy   . Anxiety   . Bipolar 1 disorder (HCC)   . Depression   . GERD (gastroesophageal reflux disease)   . GI (gastrointestinal bleed)   . GI bleeding   . PTSD (post-traumatic stress disorder)   . Suicidal ideation     Patient Active Problem List   Diagnosis Date Noted  . Moderate cannabis use disorder (HCC) 09/30/2018  . PTSD (post-traumatic stress disorder) 09/16/2018  . MDD (major depressive disorder), recurrent severe, without psychosis (HCC) 11/15/2017  . MDD (major depressive disorder), recurrent, severe, with psychosis (HCC) 11/03/2017  . Depression   . Borderline personality disorder (HCC) 02/10/2016  . Substance induced mood disorder (HCC) 02/09/2016  . Alcohol withdrawal (HCC) 08/23/2015  . Tobacco use disorder 08/23/2015  . Cocaine use  disorder, severe, dependence (HCC) 08/11/2015  . Alcohol use disorder, severe, dependence (HCC) 08/11/2015  . Cannabis use disorder, severe, dependence (HCC) 08/11/2015    Past Surgical History:  Procedure Laterality Date  . NO PAST SURGERIES          Home Medications    Prior to Admission medications   Medication Sig Start Date End Date Taking? Authorizing Provider  albuterol (PROVENTIL HFA;VENTOLIN HFA) 108 (90 Base) MCG/ACT inhaler Inhale 2 puffs into the lungs every 6 (six) hours as needed for wheezing or shortness of breath.    [provider]  ENSURE (ENSURE) Take 237 mLs by mouth daily as needed (meal supplement).    [provider]  hydrOXYzine (ATARAX/VISTARIL) 50 MG tablet Take 1 tablet (50 mg total) by mouth every 6 (six) hours as needed for anxiety. 10/03/18   Money, Gerlene Burdock, FNP  lithium carbonate (LITHOBID) 300 MG CR tablet Take 1 tablet (300 mg total) by mouth every 12 (twelve) hours. For mood control 10/03/18   Money, Feliz Beam B, FNP  mirtazapine (REMERON) 15 MG tablet Take 1 tablet (15 mg total) by mouth at bedtime. For mood control 10/03/18   Money, Gerlene Burdock, FNP  OLANZapine (ZYPREXA) 10 MG tablet Take 1 tablet (10 mg total) by mouth at bedtime. For mood control 10/03/18   Money, Gerlene Burdock, FNP  pantoprazole (PROTONIX) 40 MG tablet Take 40 mg by mouth daily.    [provider]  ramelteon (ROZEREM) 8 MG tablet Take 1 tablet (8 mg total) by mouth at bedtime. For insomnia 10/03/18   Money, Gerlene Burdock, FNP  traZODone (DESYREL) 50 MG tablet Take 1 tablet (50 mg total) by mouth at bedtime as needed for sleep. Patient not taking: Reported on 10/05/2018 10/03/18   Money, Gerlene Burdock, FNP    Family History Family History  Problem Relation Age of Onset  . Stroke Mother     Social History Social History   Tobacco Use  . Smoking status: Current Every Day Smoker    Packs/day: 2.00    Types: Cigarettes  . Smokeless tobacco: Former Engineer, water Use  Topics  . Alcohol use: Yes    Alcohol/week: 84.0 standard drinks    Types: 84 Cans of beer per week    Comment: Daily  . Drug use: No    Types: Marijuana, Cocaine     Allergies   Acetaminophen and Ibuprofen   Review of Systems Review of Systems  Psychiatric/Behavioral: Positive for suicidal ideas.  All other systems reviewed and are negative.    Physical Exam Updated Vital Signs BP 105/72 (BP Location: Left Arm)   Pulse 80   Temp 98.9 F (37.2 C)   SpO2 100%   Physical Exam  Constitutional: He is oriented to person, place, and time. He appears well-developed and well-nourished.  Appears intoxicated, barely able to hold his head upright in chair  HENT:  Head: Normocephalic and atraumatic.  Mouth/Throat: Oropharynx is clear and moist.  Eyes: Pupils are equal, round, and reactive to light. Conjunctivae and EOM are normal.  Neck: Normal range of motion.  Cardiovascular: Normal rate, regular rhythm and normal heart sounds.  Pulmonary/Chest: Effort normal and breath sounds normal.  Abdominal: Soft. Bowel sounds are normal.  Musculoskeletal: Normal range of motion.  Neurological: He is alert and oriented to person, place, and time.  Skin: Skin is warm and dry.  Psychiatric: He has a normal mood and affect.  Nursing note and vitals reviewed.    ED Treatments / Results  Labs (all labs ordered are listed, but only abnormal results are displayed) Labs Reviewed  COMPREHENSIVE METABOLIC PANEL - Abnormal; Notable for the following components:      Result Value   Calcium 8.8 (*)    ALT 47 (*)    All other components within normal limits  ETHANOL - Abnormal; Notable for the following components:   Alcohol, Ethyl (B) 147 (*)    All other components within normal limits  ACETAMINOPHEN LEVEL - Abnormal; Notable for the following components:   Acetaminophen (Tylenol), Serum <10 (*)    All other components within normal limits  RAPID URINE DRUG SCREEN, HOSP PERFORMED -  Abnormal; Notable for the following components:   Tetrahydrocannabinol POSITIVE (*)    All other components within normal limits  SALICYLATE LEVEL  CBC  1  EKG None  Radiology No results found.  Procedures Procedures (including critical care time)  Medications Ordered in ED Medications - No data to display   Initial Impression / Assessment and Plan / ED Course  I have reviewed the triage vital signs and the nursing notes.  Pertinent labs & imaging results that were available during my care of the patient were reviewed by me and considered in my medical decision making (see chart for details).  27 year old male sent over from behavioral health for medical clearance and overnight observation.  He is to be reassessed by psychiatry in the morning.  Patient somewhat uncooperative here, answering very limited questions.  Does report drinking 12 beers today.  Apparently relapsed on alcohol today and girlfriend was concerned about his continued  suicidal thoughts.  He has had multiple prior psychiatric hospitalizations.  Labs reviewed, ethanol 147, UDS positive for THC.  Patient medically cleared.  Will monitor overnight, psychiatry to reassess in the morning and determine disposition.  Final Clinical Impressions(s) / ED Diagnoses   Final diagnoses:  Alcohol abuse  Suicidal thoughts    ED Discharge Orders    None       Garlon Hatchet, PA-C 10/08/18 0345    Zadie Rhine, MD 10/08/18 [phone]

## 2018-10-08 NOTE — ED Notes (Signed)
Bed: [facility] Expected date:  Expected time:  Means of arrival:  Comments: 

## 2018-10-08 NOTE — ED Notes (Addendum)
Patient vomited again after zofran given.  Dr. Ranae Palms notified.  No further orders at this time.  Patient reports he has been drinking since he was 13, but recently had stopped for a week, then went on a binge last night.  Usually drinks a 12 pack everyday.  Patient resting now with no more vomiting episodes.  Patient not tremulous or exhibiting signs of withdrawal.

## 2018-10-08 NOTE — BHH Suicide Risk Assessment (Signed)
[facility] Discharge Suicide Risk Assessment   Principal Problem: Alcohol use disorder, severe, dependence (HCC) Discharge Diagnoses:  Patient Active Problem List   Diagnosis Date Noted  . 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 PTSD (post-traumatic stress disorder) [F43.10] 09/16/2018  . MDD (major depressive disorder), recurrent severe, without psychosis (HCC) [F33.2] 11/15/2017  . MDD (major depressive disorder), recurrent, severe, with psychosis (HCC) [F33.3] 11/03/2017  . Depression [F32.9]   . Borderline personality disorder (HCC) [F60.3] 02/10/2016  . Substance induced mood disorder (HCC) [F19.94] 02/09/2016  . Alcohol withdrawal (HCC) [F10.239] 08/23/2015  . Tobacco use disorder [F17.200] 08/23/2015  . Cocaine use disorder, severe, dependence (HCC) [F14.20] 08/11/2015  . Alcohol use disorder, severe, dependence (HCC) [F10.20] 08/11/2015  . Cannabis use disorder, severe, dependence (HCC) [F12.20] 08/11/2015    Total Time spent with patient: 30 minutes   Mr. Cedano reports relapsing on alcohol 2 days ago with onset of SI around the same time. BAL was 147 on admission. UDS is positive for THC. He reports that his mother is also a stressor. He lives at home with his mother and 70 y/o daughter. He has been unable to afford his medications from [facility]. He would like to go to rehab for alcohol abuse. He is able to safety plan.    Musculoskeletal: Strength & Muscle Tone: within normal limits Gait & Station: normal Patient leans: N/A  Psychiatric Specialty Exam: Review of Systems  Psychiatric/Behavioral: Positive for substance abuse. Negative for hallucinations.  All other systems reviewed and are negative.   Blood pressure (!) 96/53, pulse 81, temperature 97.8 F (36.6 C), temperature source Oral, resp. rate 14, SpO2 100 %.There is no height or weight on file to calculate BMI.  General Appearance: Fairly Groomed, young, Caucasian male, wearing paper hospital scrubs  and lying in bed. NAD.   Eye Contact::  Good  Speech:  Clear and Coherent and Normal Rate  Volume:  Normal  Mood:  Depressed  Affect:  Congruent  Thought Process:  Goal Directed, Linear and Descriptions of Associations: Intact  Orientation:  Full (Time, Place, and Person)  Thought Content:  Logical  Suicidal Thoughts:  No  Homicidal Thoughts:  No  Memory:  Immediate;   Good Recent;   Good Remote;   Good  Judgement:  Fair  Insight:  Fair  Psychomotor Activity:  Normal  Concentration:  Good  Recall:  Fair  Fund of Knowledge:Good  Language: Good  Akathisia:  No  Handed:  Right  AIMS (if indicated):   N/A  Assets:  Communication Skills Desire for Improvement Housing Social Support  Sleep:   N/A  Cognition: WNL  ADL's:  Intact   Mental Status Per Nursing Assessment::   On Admission:    Psychiatric: His mood appears anxious. His speech is slurred. He is not withdrawn and not actively hallucinating. Thought content is not paranoid and not delusional. Cognition and memory are normal. He expresses impulsivity and inappropriate judgment. He exhibits a depressed mood. He expresses suicidal ideation. He expresses no homicidal ideation. He expresses suicidal plans.   Demographic Factors:  Male, Adolescent or young adult and Caucasian  Loss Factors: NA  Historical Factors: Prior suicide attempts, Family history of mental illness or substance abuse and Impulsivity  Risk Reduction Factors:   Living with another person, especially a relative and Positive social support  Continued Clinical Symptoms:  Alcohol/Substance Abuse/Dependencies More than one psychiatric diagnosis Previous Psychiatric Diagnoses and Treatments  Cognitive Features That Contribute To Risk:  None  Suicide Risk:  Minimal: No identifiable suicidal ideation.  Patients presenting with no risk factors but with morbid ruminations; may be classified as minimal risk based on the severity of the depressive  symptoms  Assessment:  Carlos French is a 27 y.o. male who was admitted with SI in the setting of relapsing on alcohol. He is able to safety plan. He is future oriented and interested in completing rehab. Peer support will provide substance abuse resources. He does not warrant inpatient psychiatric hospitalization at this time.   Plan Of Care/Follow-up recommendations:  -Continue psychotropic medications as prescribed. -Peer support consulted to provide substance abuse resources for patient as he is interested in rehab.   Cherly Beach, DO 10/08/2018, 11:26 AM

## 2018-10-22 ENCOUNTER — Ambulatory Visit: Payer: Self-pay | Admitting: Internal Medicine

## 2019-02-04 ENCOUNTER — Emergency Department (HOSPITAL_COMMUNITY)
Admission: EM | Admit: 2019-02-04 | Discharge: 2019-02-04 | Disposition: A | Discharge status: Home / Self Care | Payer: Medicaid Other | Attending: Emergency Medicine | Admitting: Emergency Medicine

## 2019-02-04 ENCOUNTER — Encounter (HOSPITAL_COMMUNITY): Payer: Self-pay | Admitting: Emergency Medicine

## 2019-02-04 ENCOUNTER — Other Ambulatory Visit: Payer: Self-pay

## 2019-02-04 DIAGNOSIS — Z5321 Procedure and treatment not carried out due to patient leaving prior to being seen by health care provider: Secondary | ICD-10-CM | POA: Insufficient documentation

## 2019-02-04 DIAGNOSIS — R2242 Localized swelling, mass and lump, left lower limb: Secondary | ICD-10-CM | POA: Insufficient documentation

## 2019-02-04 NOTE — ED Triage Notes (Signed)
Pt. Stated, I have a knot on the left side of my genital, its been there for about a year and I think its gotten bigger.

## 2019-05-26 ENCOUNTER — Emergency Department (HOSPITAL_COMMUNITY)
Admission: EM | Admit: 2019-05-26 | Discharge: 2019-05-26 | Disposition: A | Discharge status: Home Health | Payer: Self-pay | Attending: Emergency Medicine | Admitting: Emergency Medicine

## 2019-05-26 ENCOUNTER — Encounter (HOSPITAL_COMMUNITY): Payer: Self-pay

## 2019-05-26 ENCOUNTER — Emergency Department (HOSPITAL_COMMUNITY): Payer: Self-pay

## 2019-05-26 ENCOUNTER — Other Ambulatory Visit: Payer: Self-pay

## 2019-05-26 DIAGNOSIS — Z79899 Other long term (current) drug therapy: Secondary | ICD-10-CM | POA: Insufficient documentation

## 2019-05-26 DIAGNOSIS — N492 Inflammatory disorders of scrotum: Secondary | ICD-10-CM | POA: Insufficient documentation

## 2019-05-26 DIAGNOSIS — F17210 Nicotine dependence, cigarettes, uncomplicated: Secondary | ICD-10-CM | POA: Insufficient documentation

## 2019-05-26 MED ORDER — AMOXICILLIN-POT CLAVULANATE 875-125 MG PO TABS: 1.0000 | ORAL_TABLET | Freq: Two times a day (BID) | ORAL | 0 refills | Status: DC

## 2019-05-26 MED ORDER — TRAMADOL HCL 50 MG PO TABS: 50.0000 mg | ORAL_TABLET | Freq: Four times a day (QID) | ORAL | 0 refills | Status: DC | PRN

## 2019-05-26 MED ORDER — LIDOCAINE HCL 2 % IJ SOLN
20.0000 mL | Freq: Once | INTRAMUSCULAR | Status: AC
  Administered 2019-05-26: 400 mg
  Filled 2019-05-26: qty 20

## 2019-05-26 NOTE — ED Provider Notes (Signed)
  Face-to-face evaluation   History: He complains of a mass in his left scrotum, present for several weeks, worsening in the last 2 to 3 days.  The mass is gotten bigger and more painful.  No known trauma.  No urinary tract symptoms.  Physical exam: Alert, calm, cooperative.  Scrotum with 3 to 4 cm mass, seemingly adherent to the scrotal wall, moderately tender.  Otherwise normal penis, scrotum and scrotal contents.  Small lymph nodes left groin, nontender to palpation.   Procedure-needle aspiration of abscess of left scrotal wall.  After cleansing with Betadine I was able to insert an 18-gauge needle on a 5 cc syringe into the abscess.  I was able to remove 4 cc of purulent material, with partial decompression of the abscess and dramatic improvement of the patient's pain.  Additional treatment, with formal I&D to be done at this point, by physicians assistant who I saw the patient with.  Medical screening examination/treatment/procedure(s) were conducted as a shared visit with non-physician practitioner(s) and myself.  I personally evaluated the patient during the encounter    Mancel Bale, MD 06/02/19 1455

## 2019-05-26 NOTE — Discharge Instructions (Signed)
Return here as needed. Follow up with the urologist provided. Keep the area clean and dry.

## 2019-05-26 NOTE — ED Triage Notes (Signed)
Patient reports that he had a pea size abscess to the left scrotal area 7 months ago and 2 days ago the area is bigger than a golf ball and has swelling and redness.

## 2019-06-01 NOTE — ED Provider Notes (Signed)
[facility]-EMERGENCY DEPT Provider Note   CSN: [id number] Arrival date & time: 05/26/19  [id number]    History   Chief Complaint Chief Complaint  Patient presents with  . Abscess    HPI Carlos French is a 28 y.o. male.     HPI Patient presents to the emergency department with a small area that is been on the left side of his scrotum for the last 7 months but got worse over the last 2 days.  Patient states the area got very swollen tender and painful with increased warmth.  The patient states that nothing seems to make the condition better but palpation makes the pain worse.  Patient denies dysuria, penile discharge, fever, weakness, dizziness, abdominal pain, nausea, vomiting, weakness or syncope. Past Medical History:  Diagnosis Date  . Allergy   . Anxiety   . Bipolar 1 disorder (HCC)   . Depression   . GERD (gastroesophageal reflux disease)   . GI (gastrointestinal bleed)   . GI bleeding   . PTSD (post-traumatic stress disorder)   . Suicidal ideation     Patient Active Problem List   Diagnosis Date Noted  . Alcohol abuse with alcohol-induced disorder (HCC)   . Moderate cannabis use disorder (HCC) 09/30/2018  . PTSD (post-traumatic stress disorder) 09/16/2018  . MDD (major depressive disorder), recurrent severe, without psychosis (HCC) 11/15/2017  . MDD (major depressive disorder), recurrent, severe, with psychosis (HCC) 11/03/2017  . Depression   . Borderline personality disorder (HCC) 02/10/2016  . Substance induced mood disorder (HCC) 02/09/2016  . Alcohol withdrawal (HCC) 08/23/2015  . Tobacco use disorder 08/23/2015  . Cocaine use disorder, severe, dependence (HCC) 08/11/2015  . Alcohol use disorder, severe, dependence (HCC) 08/11/2015  . Cannabis use disorder, severe, dependence (HCC) 08/11/2015    Past Surgical History:  Procedure Laterality Date  . NO PAST SURGERIES          Home Medications    Prior to Admission medications    Medication Sig Start Date End Date Taking? Authorizing Provider  hydrOXYzine (ATARAX/VISTARIL) 50 MG tablet Take 1 tablet (50 mg total) by mouth every 6 (six) hours as needed for anxiety. 10/03/18  Yes Money, Gerlene Burdockravis B, FNP  amoxicillin-clavulanate (AUGMENTIN) 875-125 MG tablet Take 1 tablet by mouth every 12 (twelve) hours. 05/26/19   Berneita Sanagustin, Cristal Deerhristopher, PA-C  lithium carbonate (LITHOBID) 300 MG CR tablet Take 1 tablet (300 mg total) by mouth every 12 (twelve) hours. For mood control Patient not taking: Reported on 05/26/2019 10/03/18   Money, Gerlene Burdockravis B, FNP  mirtazapine (REMERON) 15 MG tablet Take 1 tablet (15 mg total) by mouth at bedtime. For mood control Patient not taking: Reported on 05/26/2019 10/03/18   Money, Gerlene Burdockravis B, FNP  OLANZapine (ZYPREXA) 10 MG tablet Take 1 tablet (10 mg total) by mouth at bedtime. For mood control Patient not taking: Reported on 05/26/2019 10/03/18   Money, Gerlene Burdockravis B, FNP  ramelteon (ROZEREM) 8 MG tablet Take 1 tablet (8 mg total) by mouth at bedtime. For insomnia Patient not taking: Reported on 05/26/2019 10/03/18   Money, Gerlene Burdockravis B, FNP  traMADol (ULTRAM) 50 MG tablet Take 1 tablet (50 mg total) by mouth every 6 (six) hours as needed for severe pain. 05/26/19   Dusten Ellinwood, Cristal Deerhristopher, PA-C  traZODone (DESYREL) 50 MG tablet Take 1 tablet (50 mg total) by mouth at bedtime as needed for sleep. Patient not taking: Reported on 10/05/2018 10/03/18   Money, Gerlene Burdockravis B, FNP    Family History Family  History  Problem Relation Age of Onset  . Stroke Mother     Social History Social History   Tobacco Use  . Smoking status: Current Every Day Smoker    Packs/day: 1.50    Types: Cigarettes  . Smokeless tobacco: Former Network engineer Use Topics  . Alcohol use: Not Currently    Alcohol/week: 84.0 standard drinks    Types: 84 Cans of beer per week  . Drug use: Yes    Types: Marijuana     Allergies   Acetaminophen and Ibuprofen   Review of Systems Review of Systems All  other systems negative except as documented in the HPI. All pertinent positives and negatives as reviewed in the HPI.  Physical Exam Updated Vital Signs BP 133/89 (BP Location: Right Arm)   Pulse 76   Temp 98.4 F (36.9 C) (Oral)   Resp 17   Ht 5\' 6"  (1.676 m)   Wt 52.2 kg   SpO2 100%   BMI 18.56 kg/m   Physical Exam Vitals signs and nursing note reviewed.  Constitutional:      General: He is not in acute distress.    Appearance: He is well-developed.  HENT:     Head: Normocephalic and atraumatic.  Eyes:     Pupils: Pupils are equal, round, and reactive to light.  Pulmonary:     Effort: Pulmonary effort is normal.  Genitourinary:   Skin:    General: Skin is warm and dry.  Neurological:     Mental Status: He is alert and oriented to person, place, and time.      ED Treatments / Results  Labs (all labs ordered are listed, but only abnormal results are displayed) Labs Reviewed - No data to display  EKG None  Radiology No results found.  Procedures Procedures (including critical care time)  Medications Ordered in ED Medications  lidocaine (XYLOCAINE) 2 % (with pres) injection 400 mg (400 mg Infiltration Given 05/26/19 1430)     Initial Impression / Assessment and Plan / ED Course  I have reviewed the triage vital signs and the nursing notes.  Pertinent labs & imaging results that were available during my care of the patient were reviewed by me and considered in my medical decision making (see chart for details).        INCISION AND DRAINAGE Performed by: Resa Miner Salil Raineri Consent: Verbal consent obtained. Risks and benefits: risks, benefits and alternatives were discussed Type: abscess  Body area: L scrotum  Anesthesia: local infiltration  Incision was made with a scalpel.  Local anesthetic: lidocaine 2% wo epinephrine  Anesthetic total: 6 ml  Complexity: complex Blunt dissection to break up loculations  Drainage: purulent  Drainage  amount: large  Packing material: 1/4 in iodoform gauze  Patient tolerance: Patient tolerated the procedure well with no immediate complications.    I spoke with urology about the patient and this abnormal area involving the epididymis and this abscess.  They will follow-up with him in 2 days for recheck of the area and further evaluation.   Final Clinical Impressions(s) / ED Diagnoses   Final diagnoses:  Abscess of scrotum    ED Discharge Orders         Ordered    amoxicillin-clavulanate (AUGMENTIN) 875-125 MG tablet  Every 12 hours,   Status:  Discontinued     05/26/19 1535    traMADol (ULTRAM) 50 MG tablet  Every 6 hours PRN,   Status:  Discontinued     05/26/19  1535    amoxicillin-clavulanate (AUGMENTIN) 875-125 MG tablet  Every 12 hours     05/26/19 1538    traMADol (ULTRAM) 50 MG tablet  Every 6 hours PRN     05/26/19 1538           Rozalyn Osland, [place]-Bay[place]ristopher, PA-C 06/01/19 1522    Mancel BaleWentz, Elliott, MD 06/02/19 1455

## 2019-09-21 IMAGING — CR DG CHEST 2V
2 series · 3 of 3 positions shown · non-contrast
Comparison: October 25, 2017

CLINICAL DATA: Productive cough.

EXAM:
CHEST  2 VIEW

[Series 2: chest lat · 0.14mm/px · 2 of 2 slices shown]
[im 1/2]
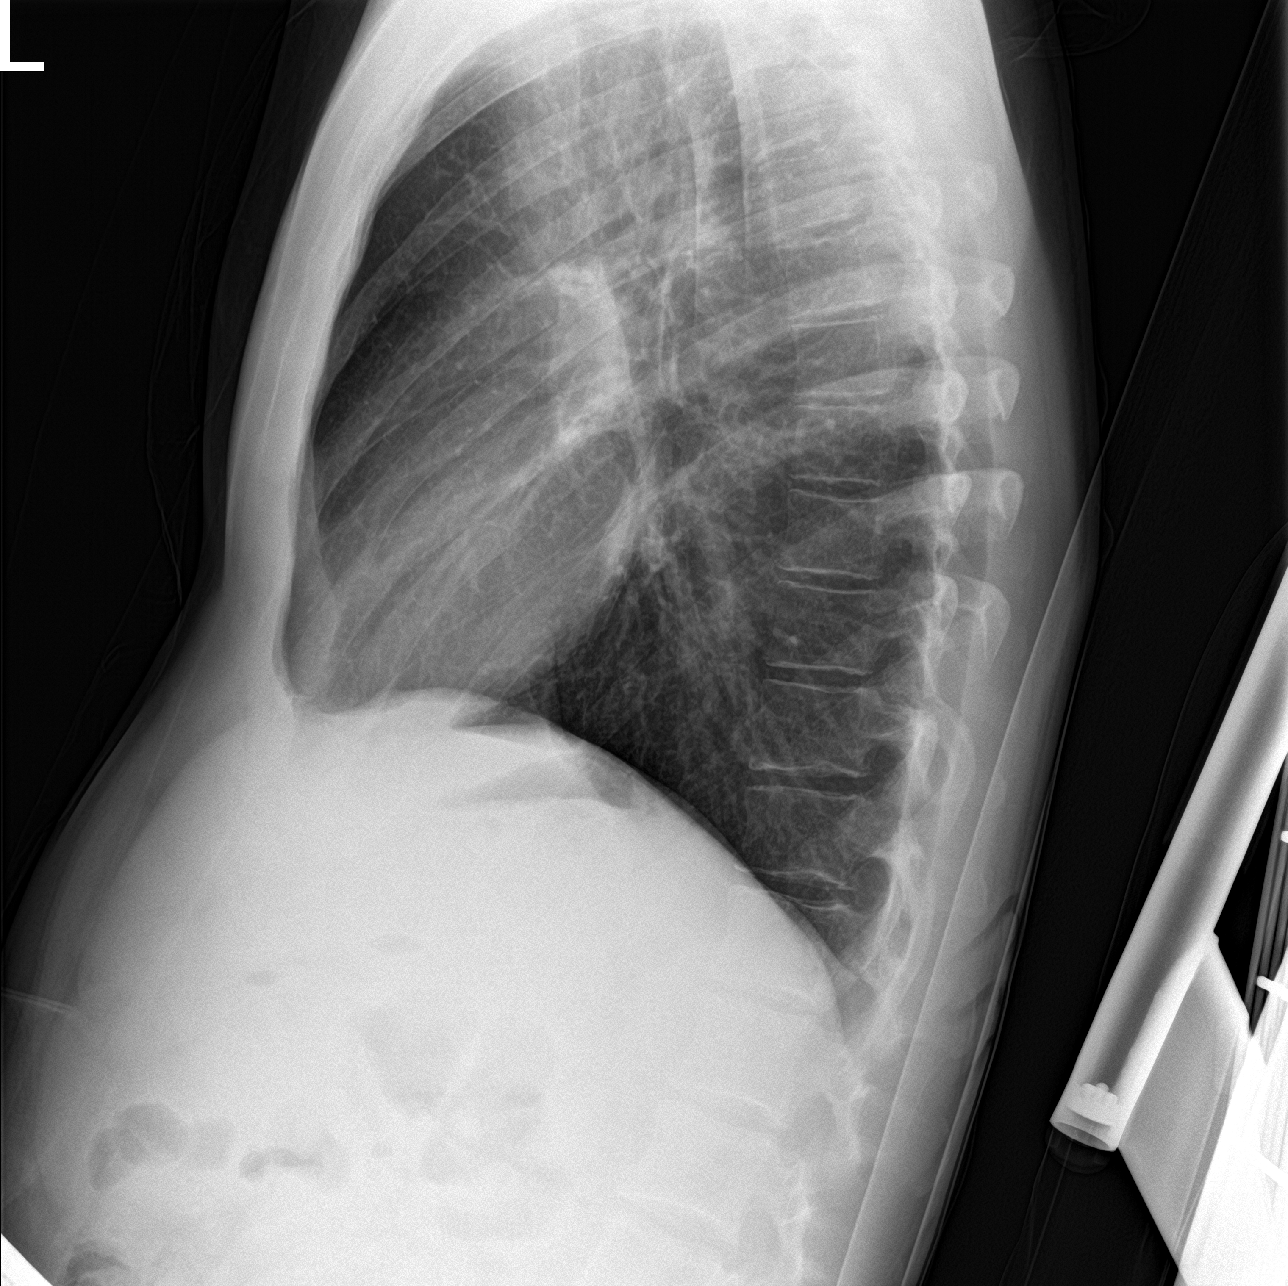
[im 2/2]
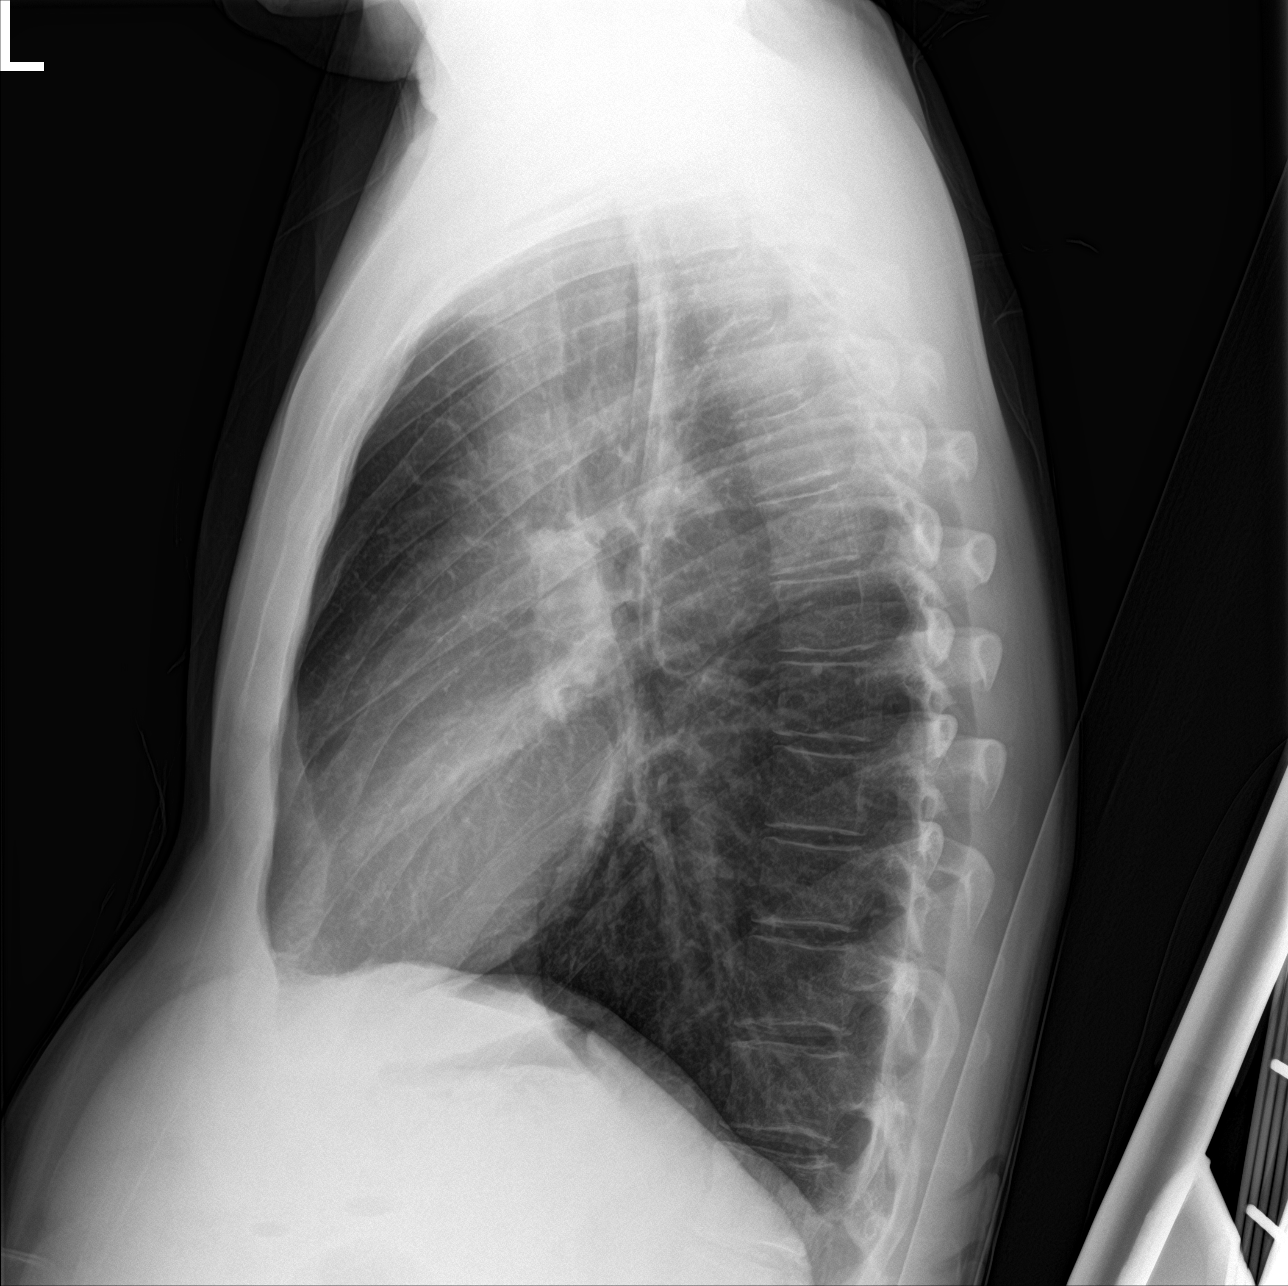

[chest ap]
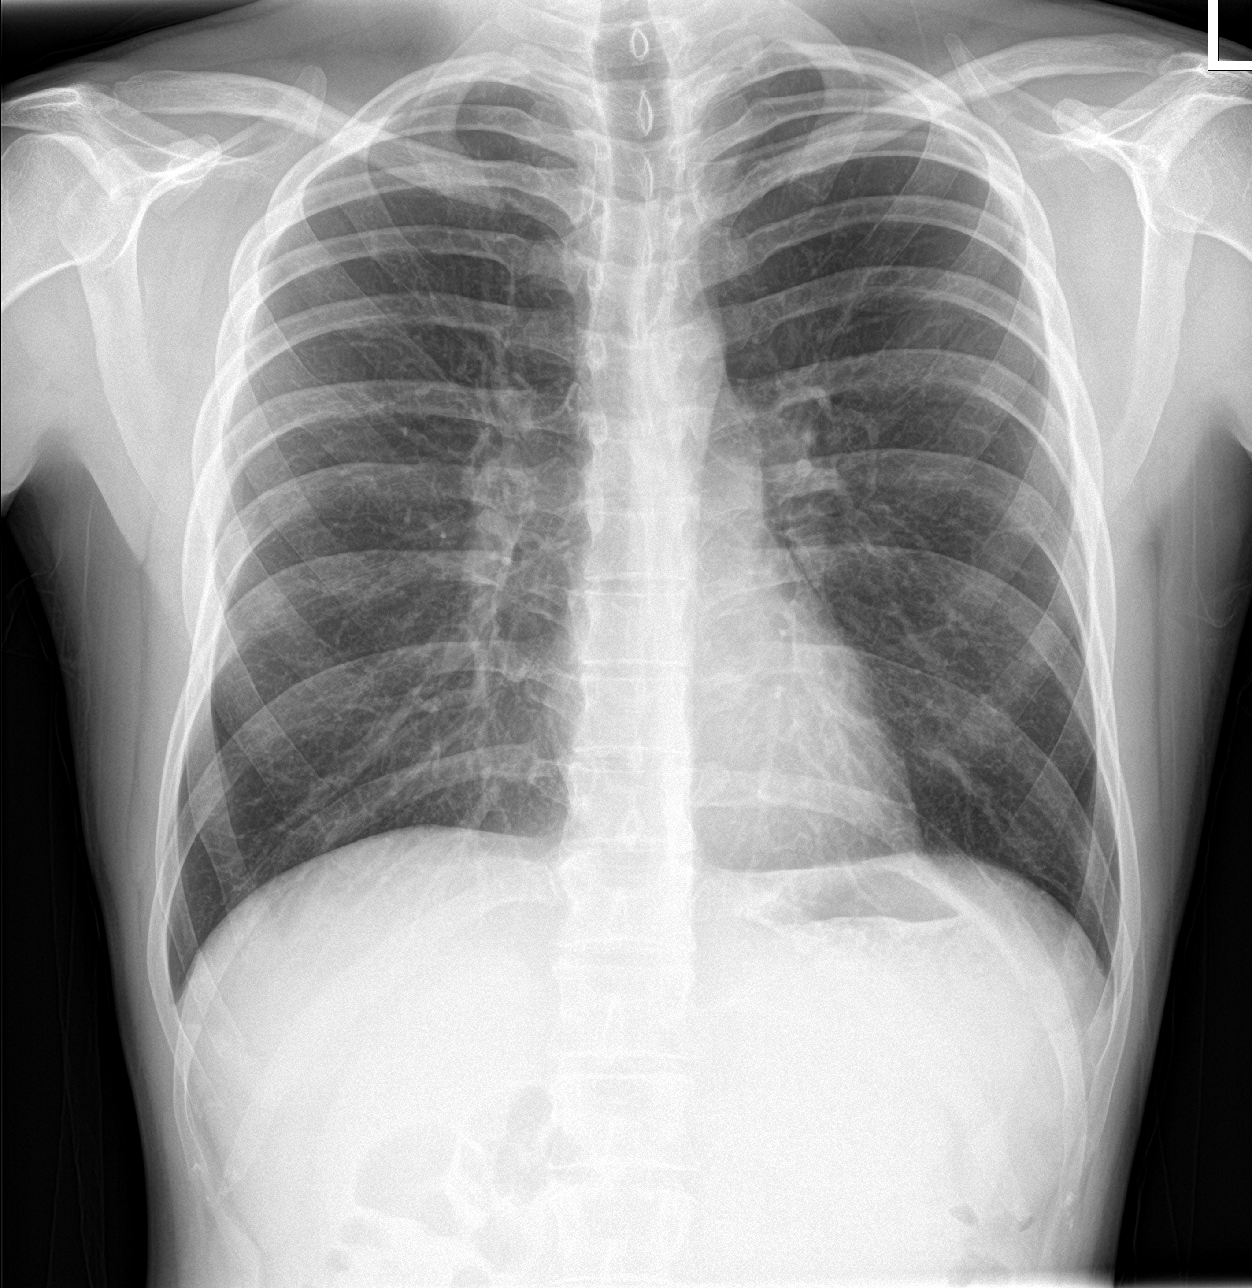

[3 of 3 positions shown; findings below may reference images not displayed]

FINDINGS: The heart size and mediastinal contours are within normal limits.
Both lungs are clear. The visualized skeletal structures are
unremarkable.
IMPRESSION: No active cardiopulmonary disease.

## 2020-04-12 ENCOUNTER — Encounter (HOSPITAL_COMMUNITY): Payer: Self-pay | Admitting: Registered Nurse

## 2020-04-12 ENCOUNTER — Emergency Department (HOSPITAL_COMMUNITY)
Admission: EM | Admit: 2020-04-12 | Discharge: 2020-04-12 | Disposition: A | Discharge status: Other Acute Inpatient Hospital | Payer: Self-pay | Attending: Emergency Medicine | Admitting: Emergency Medicine

## 2020-04-12 ENCOUNTER — Inpatient Hospital Stay (HOSPITAL_COMMUNITY)
Admission: AD | Admit: 2020-04-12 | Discharge: 2020-04-15 | DRG: 885 | Disposition: A | Discharge status: Home / Self Care | Payer: Federal, State, Local not specified - Other | Source: Intra-hospital | Attending: Psychiatry | Admitting: Psychiatry

## 2020-04-12 ENCOUNTER — Encounter (HOSPITAL_COMMUNITY): Payer: Self-pay | Admitting: Emergency Medicine

## 2020-04-12 DIAGNOSIS — Z818 Family history of other mental and behavioral disorders: Secondary | ICD-10-CM

## 2020-04-12 DIAGNOSIS — F10239 Alcohol dependence with withdrawal, unspecified: Secondary | ICD-10-CM | POA: Diagnosis not present

## 2020-04-12 DIAGNOSIS — K219 Gastro-esophageal reflux disease without esophagitis: Secondary | ICD-10-CM | POA: Insufficient documentation

## 2020-04-12 DIAGNOSIS — F332 Major depressive disorder, recurrent severe without psychotic features: Secondary | ICD-10-CM | POA: Diagnosis present

## 2020-04-12 DIAGNOSIS — F4310 Post-traumatic stress disorder, unspecified: Secondary | ICD-10-CM | POA: Diagnosis present

## 2020-04-12 DIAGNOSIS — Z886 Allergy status to analgesic agent status: Secondary | ICD-10-CM | POA: Diagnosis not present

## 2020-04-12 DIAGNOSIS — F4312 Post-traumatic stress disorder, chronic: Secondary | ICD-10-CM | POA: Insufficient documentation

## 2020-04-12 DIAGNOSIS — E538 Deficiency of other specified B group vitamins: Secondary | ICD-10-CM | POA: Diagnosis present

## 2020-04-12 DIAGNOSIS — G4700 Insomnia, unspecified: Secondary | ICD-10-CM | POA: Diagnosis present

## 2020-04-12 DIAGNOSIS — R45851 Suicidal ideations: Secondary | ICD-10-CM | POA: Diagnosis present

## 2020-04-12 DIAGNOSIS — F10229 Alcohol dependence with intoxication, unspecified: Secondary | ICD-10-CM | POA: Insufficient documentation

## 2020-04-12 DIAGNOSIS — Z79899 Other long term (current) drug therapy: Secondary | ICD-10-CM | POA: Insufficient documentation

## 2020-04-12 DIAGNOSIS — F1020 Alcohol dependence, uncomplicated: Secondary | ICD-10-CM | POA: Diagnosis not present

## 2020-04-12 DIAGNOSIS — Z823 Family history of stroke: Secondary | ICD-10-CM | POA: Diagnosis not present

## 2020-04-12 DIAGNOSIS — Y906 Blood alcohol level of 120-199 mg/100 ml: Secondary | ICD-10-CM | POA: Insufficient documentation

## 2020-04-12 DIAGNOSIS — Z20822 Contact with and (suspected) exposure to covid-19: Secondary | ICD-10-CM | POA: Insufficient documentation

## 2020-04-12 DIAGNOSIS — E519 Thiamine deficiency, unspecified: Secondary | ICD-10-CM | POA: Diagnosis present

## 2020-04-12 DIAGNOSIS — F1010 Alcohol abuse, uncomplicated: Secondary | ICD-10-CM

## 2020-04-12 DIAGNOSIS — F17203 Nicotine dependence unspecified, with withdrawal: Secondary | ICD-10-CM | POA: Diagnosis not present

## 2020-04-12 DIAGNOSIS — Z915 Personal history of self-harm: Secondary | ICD-10-CM

## 2020-04-12 DIAGNOSIS — F17210 Nicotine dependence, cigarettes, uncomplicated: Secondary | ICD-10-CM | POA: Insufficient documentation

## 2020-04-12 LAB — CBC
HCT: 47.3 % (ref 39.0–52.0)
Hemoglobin: 16.2 g/dL (ref 13.0–17.0)
MCH: 33.4 pg (ref 26.0–34.0)
MCHC: 34.2 g/dL (ref 30.0–36.0)
MCV: 97.5 fL (ref 80.0–100.0)
Platelets: 285 10*3/uL (ref 150–400)
RBC: 4.85 MIL/uL (ref 4.22–5.81)
RDW: 13.6 % (ref 11.5–15.5)
WBC: 8.4 10*3/uL (ref 4.0–10.5)
nRBC: 0 % (ref 0.0–0.2)

## 2020-04-12 LAB — RAPID URINE DRUG SCREEN, HOSP PERFORMED
Amphetamines: NOT DETECTED
Barbiturates: NOT DETECTED
Benzodiazepines: NOT DETECTED
Cocaine: NOT DETECTED
Opiates: NOT DETECTED
Tetrahydrocannabinol: POSITIVE — AB

## 2020-04-12 LAB — COMPREHENSIVE METABOLIC PANEL
ALT: 68 U/L — ABNORMAL HIGH (ref 0–44)
AST: 85 U/L — ABNORMAL HIGH (ref 15–41)
Albumin: 4.6 g/dL (ref 3.5–5.0)
Alkaline Phosphatase: 72 U/L (ref 38–126)
Anion gap: 17 — ABNORMAL HIGH (ref 5–15)
BUN: 6 mg/dL (ref 6–20)
CO2: 22 mmol/L (ref 22–32)
Calcium: 9.2 mg/dL (ref 8.9–10.3)
Chloride: 96 mmol/L — ABNORMAL LOW (ref 98–111)
Creatinine, Ser: 0.86 mg/dL (ref 0.61–1.24)
GFR calc Af Amer: >60 mL/min (ref >60)
GFR calc non Af Amer: >60 mL/min (ref >60)
Glucose, Bld: 105 mg/dL — ABNORMAL HIGH (ref 70–99)
Potassium: 3.7 mmol/L (ref 3.5–5.1)
Sodium: 135 mmol/L (ref 135–145)
Total Bilirubin: 0.8 mg/dL (ref 0.3–1.2)
Total Protein: 7.9 g/dL (ref 6.5–8.1)

## 2020-04-12 LAB — ETHANOL: Alcohol, Ethyl (B): 171 mg/dL — ABNORMAL HIGH (ref <10)

## 2020-04-12 LAB — RESPIRATORY PANEL BY RT PCR (FLU A&B, COVID)
Influenza A by PCR: NEGATIVE
Influenza B by PCR: NEGATIVE
SARS Coronavirus 2 by RT PCR: NEGATIVE

## 2020-04-12 LAB — SALICYLATE LEVEL: Salicylate Lvl: <7 mg/dL — ABNORMAL LOW (ref 7.0–30.0)

## 2020-04-12 LAB — ACETAMINOPHEN LEVEL: Acetaminophen (Tylenol), Serum: <10 ug/mL — ABNORMAL LOW (ref 10–30)

## 2020-04-12 MED ORDER — THIAMINE HCL 100 MG PO TABS
100.0000 mg | ORAL_TABLET | Freq: Every day | ORAL | Status: DC
  Administered 2020-04-13 – 2020-04-15 (×3): 100 mg via ORAL
  Filled 2020-04-12 (×5): qty 1

## 2020-04-12 MED ORDER — THIAMINE HCL 100 MG PO TABS
100.0000 mg | ORAL_TABLET | Freq: Every day | ORAL | Status: DC
  Administered 2020-04-12: 100 mg via ORAL
  Filled 2020-04-12: qty 1

## 2020-04-12 MED ORDER — LORAZEPAM 1 MG PO TABS
0.0000 mg | ORAL_TABLET | Freq: Four times a day (QID) | ORAL | Status: DC
  Administered 2020-04-12: 1 mg via ORAL
  Filled 2020-04-12: qty 1

## 2020-04-12 MED ORDER — FAMOTIDINE 20 MG PO TABS
20.0000 mg | ORAL_TABLET | Freq: Two times a day (BID) | ORAL | Status: DC
  Administered 2020-04-12: 20 mg via ORAL
  Filled 2020-04-12: qty 1

## 2020-04-12 MED ORDER — ONDANSETRON 4 MG PO TBDP
4.0000 mg | ORAL_TABLET | Freq: Once | ORAL | Status: AC
  Administered 2020-04-12: 4 mg via ORAL
  Filled 2020-04-12: qty 1

## 2020-04-12 MED ORDER — LORAZEPAM 1 MG PO TABS
1.0000 mg | ORAL_TABLET | Freq: Four times a day (QID) | ORAL | Status: DC | PRN
  Administered 2020-04-12 – 2020-04-13 (×2): 1 mg via ORAL
  Filled 2020-04-12 (×2): qty 1

## 2020-04-12 MED ORDER — ALUM & MAG HYDROXIDE-SIMETH 200-200-20 MG/5ML PO SUSP: 30.0000 mL | Freq: Four times a day (QID) | ORAL | Status: DC | PRN

## 2020-04-12 MED ORDER — TRAZODONE HCL 50 MG PO TABS
50.0000 mg | ORAL_TABLET | Freq: Every evening | ORAL | Status: DC | PRN
  Administered 2020-04-12 – 2020-04-14 (×3): 50 mg via ORAL
  Filled 2020-04-12: qty 1
  Filled 2020-04-12: qty 7
  Filled 2020-04-12 (×2): qty 1

## 2020-04-12 MED ORDER — LORAZEPAM 1 MG PO TABS: 0.0000 mg | ORAL_TABLET | Freq: Two times a day (BID) | ORAL | Status: DC

## 2020-04-12 MED ORDER — HYDROXYZINE HCL 25 MG PO TABS
25.0000 mg | ORAL_TABLET | Freq: Four times a day (QID) | ORAL | Status: DC | PRN
  Administered 2020-04-13 – 2020-04-14 (×4): 25 mg via ORAL
  Filled 2020-04-12 (×4): qty 1
  Filled 2020-04-12: qty 10
  Filled 2020-04-12: qty 1

## 2020-04-12 MED ORDER — THIAMINE HCL 100 MG/ML IJ SOLN: 100.0000 mg | Freq: Every day | INTRAMUSCULAR | Status: DC

## 2020-04-12 MED ORDER — MAGNESIUM HYDROXIDE 400 MG/5ML PO SUSP: 30.0000 mL | Freq: Every day | ORAL | Status: DC | PRN

## 2020-04-12 MED ORDER — ONDANSETRON 4 MG PO TBDP: 4.0000 mg | ORAL_TABLET | Freq: Four times a day (QID) | ORAL | Status: DC | PRN

## 2020-04-12 MED ORDER — LORAZEPAM 2 MG/ML IJ SOLN: 0.0000 mg | Freq: Four times a day (QID) | INTRAMUSCULAR | Status: DC

## 2020-04-12 MED ORDER — LOPERAMIDE HCL 2 MG PO CAPS
2.0000 mg | ORAL_CAPSULE | ORAL | Status: DC | PRN
  Administered 2020-04-12: 2 mg via ORAL
  Administered 2020-04-14: 21:00:00 4 mg via ORAL
  Filled 2020-04-12: qty 2
  Filled 2020-04-12: qty 1

## 2020-04-12 MED ORDER — LORAZEPAM 2 MG/ML IJ SOLN: 0.0000 mg | Freq: Two times a day (BID) | INTRAMUSCULAR | Status: DC

## 2020-04-12 MED ORDER — ALUM & MAG HYDROXIDE-SIMETH 200-200-20 MG/5ML PO SUSP: 30.0000 mL | ORAL | Status: DC | PRN

## 2020-04-12 MED ORDER — ADULT MULTIVITAMIN W/MINERALS CH
1.0000 | ORAL_TABLET | Freq: Every day | ORAL | Status: DC
  Administered 2020-04-13 – 2020-04-15 (×3): 1 via ORAL
  Filled 2020-04-12 (×5): qty 1
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
## 2020-04-12 NOTE — BHH Counselor (Signed)
Inpatient treatment is recommended per Assunta Found, NP.  Patient will be considered for admission to [facility] once he has a negative Covid test.   RN, Azzie Roup. is aware of the disposition plan.

## 2020-04-12 NOTE — ED Triage Notes (Addendum)
Pt arrives to ED reporting alcohol abuse for over 1 year and recently had a lot of loss with the mother of his child and no longer has custody of daughter. Pt states he has been trying to drink himself to death.  Last drank a 1/5 of liquor last night.

## 2020-04-12 NOTE — ED Provider Notes (Signed)
[facility] EMERGENCY DEPARTMENT Provider Note   CSN: [id number] Arrival date & time: 04/12/20  1022     History Chief Complaint  Patient presents with  . Alcohol Problem  . Suicidal    Carlos French is a 29 y.o. male.  HPI Patient has multiple situational stressors at this time.  Patient reports he is a chronic alcoholic.  He has been drinking heavily.  He last had alcohol at approximately 4 AM the morning prior to presentation.  Reports he drank about 7 beers and part of 1/5 of liquor.  Patient reports he does start to experience some withdrawal symptoms if he stops drinking.  He reports his drinking is escalated and this is in an attempt to essentially kill himself by alcohol.  He reports his aunt died recently and the mother of his child died of overdose.  He reports his 64 year old daughter does not want to have any interaction with him.  Patient reports he does have a distant history of GI bleed but has not been having any vomiting or seeing any blood.    Past Medical History:  Diagnosis Date  . Allergy   . Anxiety   . Bipolar 1 disorder ([place])   . Depression   . GERD (gastroesophageal reflux disease)   . GI (gastrointestinal bleed)   . GI bleeding   . PTSD (post-traumatic stress disorder)   . Suicidal ideation     Patient Active Problem List   Diagnosis Date Noted  . Alcohol abuse with alcohol-induced disorder ([place])   . Moderate cannabis use disorder ([place]) 09/30/2018  . PTSD (post-traumatic stress disorder) 09/16/2018  . MDD (major depressive disorder), recurrent severe, without psychosis ([place]) 11/15/2017  . MDD (major depressive disorder), recurrent, severe, with psychosis ([place]) 11/03/2017  . Depression   . Borderline personality disorder ([place]) 02/10/2016  . Substance induced mood disorder ([place]) 02/09/2016  . Alcohol withdrawal ([place]) 08/23/2015  . Tobacco use disorder 08/23/2015  . Cocaine use disorder, severe, dependence ([place]) 08/11/2015  .  Alcohol use disorder, severe, dependence ([place]) 08/11/2015  . Cannabis use disorder, severe, dependence ([place]) 08/11/2015    Past Surgical History:  Procedure Laterality Date  . NO PAST SURGERIES         Family History  Problem Relation Age of Onset  . Stroke Mother     Social History   Tobacco Use  . Smoking status: Current Every Day Smoker    Packs/day: 1.50    Types: Cigarettes  . Smokeless tobacco: Former Network engineer Use Topics  . Alcohol use: Not Currently    Alcohol/week: 84.0 standard drinks    Types: 84 Cans of beer per week  . Drug use: Yes    Types: Marijuana    Home Medications Prior to Admission medications   Medication Sig Start Date End Date Taking? Authorizing Provider  calcium carbonate (TUMS EX) 750 MG chewable tablet Chew 1 tablet by mouth daily.   Yes [provider]  Multiple Vitamin (MULTIVITAMIN WITH MINERALS) TABS tablet Take 1 tablet by mouth daily.   Yes [provider]  amoxicillin-clavulanate (AUGMENTIN) 875-125 MG tablet Take 1 tablet by mouth every 12 (twelve) hours. Patient not taking: Reported on 04/12/2020 05/26/19   Dalia Heading, PA-C  hydrOXYzine (ATARAX/VISTARIL) 50 MG tablet Take 1 tablet (50 mg total) by mouth every 6 (six) hours as needed for anxiety. Patient not taking: Reported on 04/12/2020 10/03/18   Money, Lowry Ram, FNP  lithium carbonate (LITHOBID) 300 MG CR  tablet Take 1 tablet (300 mg total) by mouth every 12 (twelve) hours. For mood control Patient not taking: Reported on 05/26/2019 10/03/18   Money, Gerlene Burdock, FNP  mirtazapine (REMERON) 15 MG tablet Take 1 tablet (15 mg total) by mouth at bedtime. For mood control Patient not taking: Reported on 05/26/2019 10/03/18   Money, Gerlene Burdock, FNP  OLANZapine (ZYPREXA) 10 MG tablet Take 1 tablet (10 mg total) by mouth at bedtime. For mood control Patient not taking: Reported on 05/26/2019 10/03/18   Money, Gerlene Burdock, FNP  ramelteon (ROZEREM) 8 MG tablet Take 1 tablet  (8 mg total) by mouth at bedtime. For insomnia Patient not taking: Reported on 05/26/2019 10/03/18   Money, Gerlene Burdock, FNP  traMADol (ULTRAM) 50 MG tablet Take 1 tablet (50 mg total) by mouth every 6 (six) hours as needed for severe pain. Patient not taking: Reported on 04/12/2020 05/26/19   Charlestine Night, PA-C  traZODone (DESYREL) 50 MG tablet Take 1 tablet (50 mg total) by mouth at bedtime as needed for sleep. Patient not taking: Reported on 10/05/2018 10/03/18   Money, Gerlene Burdock, FNP    Allergies    Acetaminophen and Ibuprofen  Review of Systems   Review of Systems 10 Systems reviewed and are negative for acute change except as noted in the HPI.  Physical Exam Updated Vital Signs BP (!) 141/101 (BP Location: Left Arm)   Pulse (!) 101   Temp 97.7 F (36.5 C) (Oral)   Resp 14   Ht 5\' 6"  (1.676 m)   Wt 54.4 kg   SpO2 99%   BMI 19.37 kg/m   Physical Exam Constitutional:      Appearance: He is well-developed.     Comments: Patient is alert with clear mental status.  No confusion.  No respiratory distress.  HENT:     Head: Normocephalic and atraumatic.  Eyes:     Pupils: Pupils are equal, round, and reactive to light.  Cardiovascular:     Rate and Rhythm: Normal rate and regular rhythm.     Heart sounds: Normal heart sounds.  Pulmonary:     Effort: Pulmonary effort is normal.     Breath sounds: Normal breath sounds.  Abdominal:     General: Bowel sounds are normal. There is no distension.     Palpations: Abdomen is soft.     Tenderness: There is no abdominal tenderness.  Musculoskeletal:        General: Normal range of motion.     Cervical back: Neck supple.  Skin:    General: Skin is warm and dry.  Neurological:     Mental Status: He is alert and oriented to person, place, and time.     GCS: GCS eye subscore is 4. GCS verbal subscore is 5. GCS motor subscore is 6.     Coordination: Coordination normal.     Comments: Patient is not tremulous or confused.  Follows  commands without difficulty.  Movements are coordinated purposeful symmetric.     ED Results / Procedures / Treatments   Labs (all labs ordered are listed, but only abnormal results are displayed) Labs Reviewed  COMPREHENSIVE METABOLIC PANEL - Abnormal; Notable for the following components:      Result Value   Chloride 96 (*)    Glucose, Bld 105 (*)    AST 85 (*)    ALT 68 (*)    Anion gap 17 (*)    All other components within normal limits  ETHANOL -  Abnormal; Notable for the following components:   Alcohol, Ethyl (B) 171 (*)    All other components within normal limits  SALICYLATE LEVEL - Abnormal; Notable for the following components:   Salicylate Lvl <7.0 (*)    All other components within normal limits  ACETAMINOPHEN LEVEL - Abnormal; Notable for the following components:   Acetaminophen (Tylenol), Serum <10 (*)    All other components within normal limits  RAPID URINE DRUG SCREEN, HOSP PERFORMED - Abnormal; Notable for the following components:   Tetrahydrocannabinol POSITIVE (*)    All other components within normal limits  RESPIRATORY PANEL BY RT PCR (FLU A&B, COVID)  CBC    EKG None  Radiology No results found.  Procedures Procedures (including critical care time)  Medications Ordered in ED Medications  LORazepam (ATIVAN) injection 0-4 mg (has no administration in time range)    Or  LORazepam (ATIVAN) tablet 0-4 mg (has no administration in time range)  LORazepam (ATIVAN) injection 0-4 mg (has no administration in time range)    Or  LORazepam (ATIVAN) tablet 0-4 mg (has no administration in time range)  thiamine tablet 100 mg (has no administration in time range)    Or  thiamine (B-1) injection 100 mg (has no administration in time range)  alum & mag hydroxide-simeth (MAALOX/MYLANTA) 200-200-20 MG/5ML suspension 30 mL (has no administration in time range)  famotidine (PEPCID) tablet 20 mg (has no administration in time range)  ondansetron (ZOFRAN-ODT)  disintegrating tablet 4 mg (4 mg Oral Given 04/12/20 1438)    ED Course  I have reviewed the triage vital signs and the nursing notes.  Pertinent labs & imaging results that were available during my care of the patient were reviewed by me and considered in my medical decision making (see chart for details).    MDM Rules/Calculators/A&P                     Patient presents with concern for alcohol abuse and dependency and suicidal ideation.  Patient is alert and appropriate.  He does not show evidence of acute alcohol withdrawal.  His mental status is clear.  Patient has not been having any active vomiting.  He has distant history of GI bleed but no recent symptoms.  Patient is medically cleared for TTS evaluation and disposition.  Behavioral health has evaluated and recommended inpatient treatment at [facility].   Final Clinical Impression(s) / ED Diagnoses Final diagnoses:  Alcohol abuse  Suicidal ideations    Rx / DC Orders ED Discharge Orders    None       Arby Barrette, MD 04/12/20 1515

## 2020-04-12 NOTE — ED Notes (Signed)
REport called to [facility]

## 2020-04-12 NOTE — Progress Notes (Signed)
Pt accepted to [facility], bed 306-1    Shuvon Rankin, NP is the accepting provider.    Dr. Jama Flavors is the attending provider.    Call report to [phone]    Magda Paganini @ [facility] ED notified.     Pt is voluntary and will be transported by [employer]  Pt may arrive to [facility] after his Covid test results return as negative.   Wells Guiles, LCSW, LCAS Disposition CSW [facility] BHH/TTS [phone]

## 2020-04-12 NOTE — ED Notes (Signed)
Lunch Tray Ordered @ 1737. 

## 2020-04-12 NOTE — ED Notes (Signed)
Pt belongings given to [employer]. Safe transport is taking patient to [facility]

## 2020-04-13 ENCOUNTER — Other Ambulatory Visit: Payer: Self-pay

## 2020-04-13 LAB — TSH: TSH: 1.607 u[IU]/mL (ref 0.350–4.500)

## 2020-04-13 MED ORDER — MIRTAZAPINE 15 MG PO TABS
15.0000 mg | ORAL_TABLET | Freq: Every day | ORAL | Status: DC
  Administered 2020-04-13 – 2020-04-14 (×2): 15 mg via ORAL
  Filled 2020-04-13 (×2): qty 1
  Filled 2020-04-13: qty 7
  Filled 2020-04-13: qty 1

## 2020-04-13 MED ORDER — FOLIC ACID 1 MG PO TABS
1.0000 mg | ORAL_TABLET | Freq: Every day | ORAL | Status: DC
  Administered 2020-04-13 – 2020-04-15 (×3): 1 mg via ORAL
  Filled 2020-04-13 (×6): qty 1

## 2020-04-13 MED ORDER — NICOTINE POLACRILEX 2 MG MT GUM
2.0000 mg | CHEWING_GUM | OROMUCOSAL | Status: DC | PRN
  Administered 2020-04-13 – 2020-04-14 (×4): 2 mg via ORAL
  Filled 2020-04-13: qty 1

## 2020-04-13 MED ORDER — PANTOPRAZOLE SODIUM 40 MG PO TBEC
40.0000 mg | DELAYED_RELEASE_TABLET | Freq: Every day | ORAL | Status: DC
  Administered 2020-04-13 – 2020-04-15 (×3): 40 mg via ORAL
  Filled 2020-04-13 (×6): qty 1

## 2020-04-13 MED ORDER — ENSURE ENLIVE PO LIQD
237.0000 mL | ORAL | Status: DC
  Administered 2020-04-13 – 2020-04-14 (×2): 237 mL via ORAL
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
## 2020-04-13 NOTE — Progress Notes (Signed)
NUTRITION ASSESSMENT  Pt identified as at risk on the Malnutrition Screen Tool  INTERVENTION: Ensure Enlive po daily, each supplement provides 350 kcal and 20 grams of protein  Continue daily MVI, Folic acid, Thiamine   Patient is at high risk for refeeding, if able recommend monitoring magnesium, potassium, and phosphorus daily, MD to replete as needed.  NUTRITION DIAGNOSIS: Unintentional weight loss related to sub-optimal intake as evidenced by pt report.   Goal: Pt to meet >/= 90% of their estimated nutrition needs.  Monitor:  PO intake  Assessment:  RD working remotely.  29 y.o. male with history of MDD, recurrent, severe, alcohol use disorder, PTSD, anxiety, bipolar I disorder, history of GI bleeding, and GERD was brought in voluntarily by his uncle due to heavy alcohol use and SI.Marland Kitchen  Per assessment note, he reports recent significant losses that has triggered increased alcohol use and SI, reports drinking 1-3 cases of beer per day in addition to a fifth on some days for the past month. Noted 4/19 blood alcohol 171 mg/dL.   Patient endorses poor appetite and 30 lb wt loss in 6 months. Patient currently weighs 120 lbs, last weight prior to admission was recorded in June 2020 and he weighed 115 lbs at that time. Per history, weights have fluctuated 108-130 lbs over the past 2.5 years. Given report of poor po, wt loss, and daily alcohol use, patient would likely benefit from nutrition supplement. RD will provide Ensure daily.   Per review of labs, patient 4/19 K 3.7 (WNL), no magnesium/phosphours for review. Given pt report of significant daily alcohol consumption, concerned that patient could refeed, recommend monitoring electrolytes if able, MD to replete as needed.  Height: Ht Readings from Last 1 Encounters:  04/12/20 5\' 6"  (1.676 m)    Weight: Wt Readings from Last 1 Encounters:  04/12/20 54.4 kg    Weight Hx: Wt Readings from Last 10 Encounters:  04/12/20 54.4 kg   04/12/20 54.4 kg  05/26/19 52.2 kg  02/04/19 59 kg  10/05/18 52.6 kg  09/29/18 49.9 kg  09/15/18 49 kg  11/14/17 59 kg  11/10/17 56.7 kg  11/03/17 51.3 kg    BMI:  Body mass index is 19.37 kg/m. Pt meets criteria for normal based on current BMI.  Estimated Nutritional Needs: Kcal: 25-30 kcal/kg Protein: > 1 gram protein/kg Fluid: 1 ml/kcal  Diet Order:  Diet Order            Diet regular Room service appropriate? Yes; Fluid consistency: Thin  Diet effective now             Pt is also offered choice of unit snacks mid-morning and mid-afternoon.    Lab results and medications reviewed.   13/10/18, RD, LDN Clinical Nutrition After Hours/Weekend Pager # in Amion
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
## 2020-04-13 NOTE — Progress Notes (Signed)
   04/12/20 2352  Psych Admission Type (Psych Patients Only)  Admission Status Voluntary  Psychosocial Assessment  Patient Complaints Anxiety;Sadness;Helplessness;Hopelessness;Depression;Worthlessness  Scientist, research (life sciences)  Facial Expression Blank  Affect Anxious  Speech Logical/coherent  Interaction Assertive  Motor Activity Slow  Appearance/Hygiene In scrubs  Behavior Characteristics Cooperative;Calm  Mood Depressed  Aggressive Behavior  Targets Other (Comment) (isolates)  Type of Behavior Verbal  Effect No apparent injury  Thought Process  Coherency WDL  Content WDL  Delusions None reported or observed  Perception WDL  Hallucination None reported or observed  Judgment WDL  Confusion None  Danger to Self  Current suicidal ideation? Denies  Danger to Others  Danger to Others None reported or observed

## 2020-04-13 NOTE — Tx Team (Signed)
Initial Treatment Plan 04/13/2020 12:10 AM Carlos French OIL:[id number]    PATIENT STRESSORS: Financial difficulties Marital or family conflict Substance abuse   PATIENT STRENGTHS: Ability for insight Motivation for treatment/growth Supportive family/friends   PATIENT IDENTIFIED PROBLEMS: depression  Substance abuse  " alcohol management"  "grieving counsel"               DISCHARGE CRITERIA:  Ability to meet basic life and health needs Improved stabilization in mood, thinking, and/or behavior Reduction of life-threatening or endangering symptoms to within safe limits Verbal commitment to aftercare and medication compliance  PRELIMINARY DISCHARGE PLAN: Attend aftercare/continuing care group Attend PHP/IOP Outpatient therapy Return to previous living arrangement  PATIENT/FAMILY INVOLVEMENT: This treatment plan has been presented to and reviewed with the patient, Carlos French, and/or family member.  The patient and family have been given the opportunity to ask questions and make suggestions.  Bethann Punches, RN 04/13/2020, 12:10 AM

## 2020-04-13 NOTE — Progress Notes (Signed)
Carlos French is a 29 y.o. male Voluntary admitted for heavy alcohol consumption and suicide ideation. Pt stated he has recently experience loses in his life. Lost his daughter's mother to heroin overdose, he also lost his aunt. Pt is requesting for grief counseling. Pt is calm and cooperative, although complained of withdrawal symptoms and shakiness. Pt alert and oriented x 4, denied SI/HI, AVH and contracted for safety. Consents signed, skin/belongings search completed and pt oriented to unit. Pt stable at this time. Pt given the opportunity to express concerns and ask questions. Pt given toiletries. Will continue to monitor.
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
## 2020-04-13 NOTE — Progress Notes (Signed)
Patient states that he had a pretty good day overall. He attributed his positive day to feeling better and because he spent less time in bed. His goal for tomorrow is to get discharged.

## 2020-04-13 NOTE — Progress Notes (Signed)
Recreation Therapy Notes  Animal-Assisted Activity (AAA) Program Checklist/Progress Notes Patient Eligibility Criteria Checklist & Daily Group note for Rec Tx Intervention  Date: 4.20.21 Time: 1430 Location: [address]   AAA/T Program Assumption of Risk Form signed by Engineer, production or Parent Legal Guardian  YES   Patient is free of allergies or sever asthma  YES   Patient reports no fear of animals  YES   Patient reports no history of cruelty to animals  YES   Patient understands his/her participation is voluntary  YES  Patient washes hands before animal contact  YES   Patient washes hands after animal contact  YES   Education: Charity fundraiser, Appropriate Animal Interaction   Education Outcome: Acknowledges understanding/In group clarification offered/Needs additional education.   Clinical Observations/Feedback: Pt did not attend activity.    Caroll Rancher, LRT/CTRS         Caroll Rancher A 04/13/2020 3:35 PM

## 2020-04-13 NOTE — [facility] Suicide Risk Assessment (Signed)
[facility] INPATIENT:  Family/Significant Other Suicide Prevention Education  Suicide Prevention Education:  Patient Refusal for Family/Significant Other Suicide Prevention Education: The patient Carlos French has refused to provide written consent for family/significant other to be provided Family/Significant Other Suicide Prevention Education during admission and/or prior to discharge.  Physician notified.  SPE completed with patient, as patient refused to consent to family contact. SPI pamphlet provided to pt and pt was encouraged to share information with support network, ask questions, and talk about any concerns relating to SPE. Patient denies access to guns/firearms and verbalized understanding of information provided. Mobile Crisis information also provided to patient.    Maeola Sarah 04/13/2020, 1:42 PM

## 2020-04-14 DIAGNOSIS — F332 Major depressive disorder, recurrent severe without psychotic features: Secondary | ICD-10-CM

## 2020-04-14 DIAGNOSIS — F1020 Alcohol dependence, uncomplicated: Secondary | ICD-10-CM

## 2020-04-14 NOTE — [facility] Group Notes (Signed)
LCSW Group Therapy Note  Type of Therapy/Topic: Group Therapy: Six Dimensions of Wellness  Participation Level: Minimal  Description of Group: This group will address the concept of wellness and the six concepts of wellness: occupational, physical, social, intellectual, spiritual, and emotional. Patients will be encouraged to process areas in their lives that are out of balance and identify reasons for remaining unbalanced. Patients will be encouraged to explore ways to practice healthy habits on a daily basis to attain better physical and mental health outcomes.  Therapeutic Goals: 1. Identify aspects of wellness that they are doing well. 2. Identify aspects of wellness that they would like to improve upon. 3. Identify one action they can take to improve an aspect of wellness in their lives.    Summary of Patient Progress: Johnwesley came and left group a couple of times, while in group he listened appropriately to his peers. Kalid stated he wants to improve upon the social aspects of wellness.  Therapeutic Modalities: Cognitive Behavioral Therapy Solution-Focused Therapy Relapse Prevention

## 2020-04-14 NOTE — Progress Notes (Signed)
   04/14/20 2211  Psych Admission Type (Psych Patients Only)  Admission Status Voluntary  Psychosocial Assessment  Patient Complaints Substance abuse  Eye Contact Fair  Facial Expression Anxious  Affect Anxious  Speech Logical/coherent  Interaction Assertive  Motor Activity Slow  Appearance/Hygiene Unremarkable  Behavior Characteristics Cooperative;Anxious  Mood Depressed;Anxious  Thought Process  Coherency WDL  Content WDL  Delusions None reported or observed  Perception WDL  Hallucination None reported or observed  Judgment Poor  Confusion None  Danger to Self  Current suicidal ideation? Denies  Danger to Others  Danger to Others None reported or observed   Pt states that he has had a good day. Pt denies anxiety. Pt endorses diarrhea and was given an antidiarrheal. No other withdrawal symptoms noted.

## 2020-04-14 NOTE — Progress Notes (Signed)
Patient stated he was having stomach cramping.

## 2020-04-14 NOTE — Tx Team (Signed)
Interdisciplinary Treatment and Diagnostic Plan Update  04/14/2020 Time of Session: 9:50am Carlos French MRN: [id number]  Principal Diagnosis: <principal problem not specified>  Secondary Diagnoses: Active Problems:   MDD (major depressive disorder), recurrent severe, without psychosis ([place])   Current Medications:  Current Facility-Administered Medications  Medication Dose Route Frequency Provider Last Rate Last Admin  . alum & mag hydroxide-simeth (MAALOX/MYLANTA) 200-200-20 MG/5ML suspension 30 mL  30 mL Oral Q4H PRN French, Carlos B, NP      . feeding supplement (ENSURE ENLIVE) (ENSURE ENLIVE) liquid 237 mL  237 mL Oral Q24H French, Carlos A, MD   237 mL at 04/14/20 1002  . folic acid (FOLVITE) tablet 1 mg  1 mg Oral Daily Sharma Covert, MD   1 mg at 04/14/20 0749  . hydrOXYzine (ATARAX/VISTARIL) tablet 25 mg  25 mg Oral Q6H PRN French, Carlos B, NP   25 mg at 04/14/20 0751  . loperamide (IMODIUM) capsule 2-4 mg  2-4 mg Oral PRN French, Carlos B, NP   2 mg at 04/12/20 2159  . LORazepam (ATIVAN) tablet 1 mg  1 mg Oral Q6H PRN French, Carlos B, NP   1 mg at 04/13/20 0902  . magnesium hydroxide (MILK OF MAGNESIA) suspension 30 mL  30 mL Oral Daily PRN French, Carlos B, NP      . mirtazapine (REMERON) tablet 15 mg  15 mg Oral QHS Sharma Covert, MD   15 mg at 04/13/20 2145  . multivitamin with minerals tablet 1 tablet  1 tablet Oral Daily French, Carlos B, NP   1 tablet at 04/14/20 0750  . nicotine polacrilex (NICORETTE) gum 2 mg  2 mg Oral PRN Sharma Covert, MD   2 mg at 04/14/20 1000  . ondansetron (ZOFRAN-ODT) disintegrating tablet 4 mg  4 mg Oral Q6H PRN French, Carlos B, NP      . pantoprazole (PROTONIX) EC tablet 40 mg  40 mg Oral Daily Sharma Covert, MD   40 mg at 04/14/20 0749  . thiamine tablet 100 mg  100 mg Oral Daily French, Carlos B, NP   100 mg at 04/14/20 0750  . traZODone (DESYREL) tablet 50 mg  50 mg Oral QHS PRN French, Carlos B, NP   50 mg at  04/13/20 2145   PTA Medications: Medications Prior to Admission  Medication Sig Dispense Refill Last Dose  . calcium carbonate (TUMS EX) 750 MG chewable tablet Chew 1 tablet by mouth daily.     . hydrOXYzine (ATARAX/VISTARIL) 50 MG tablet Take 1 tablet (50 mg total) by mouth every 6 (six) hours as needed for anxiety. (Patient not taking: Reported on 04/12/2020) 30 tablet 0   . lithium carbonate (LITHOBID) 300 MG CR tablet Take 1 tablet (300 mg total) by mouth every 12 (twelve) hours. For mood control (Patient not taking: Reported on 05/26/2019) 60 tablet 0   . mirtazapine (REMERON) 15 MG tablet Take 1 tablet (15 mg total) by mouth at bedtime. For mood control (Patient not taking: Reported on 05/26/2019) 30 tablet 0   . Multiple Vitamin (MULTIVITAMIN WITH MINERALS) TABS tablet Take 1 tablet by mouth daily.     Marland Kitchen OLANZapine (ZYPREXA) 10 MG tablet Take 1 tablet (10 mg total) by mouth at bedtime. For mood control (Patient not taking: Reported on 05/26/2019) 30 tablet 0   . ramelteon (ROZEREM) 8 MG tablet Take 1 tablet (8 mg total) by mouth at bedtime. For insomnia (Patient not taking: Reported on 05/26/2019) 30 tablet  0   . traZODone (DESYREL) 50 MG tablet Take 1 tablet (50 mg total) by mouth at bedtime as needed for sleep. (Patient not taking: Reported on 10/05/2018) 30 tablet 0     Patient Stressors: Financial difficulties Marital or family conflict Substance abuse  Patient Strengths: Ability for insight Motivation for treatment/growth Supportive family/friends  Treatment Modalities: Medication Management, Group therapy, Case management,  1 to 1 session with clinician, Psychoeducation, Recreational therapy.   Physician Treatment Plan for Primary Diagnosis: <principal problem not specified> Long Term Goal(s): Improvement in symptoms so as ready for discharge Improvement in symptoms so as ready for discharge   Short Term Goals: Ability to identify changes in lifestyle to reduce recurrence of  condition will improve Ability to verbalize feelings will improve Ability to disclose and discuss suicidal ideas Ability to demonstrate self-control will improve Ability to identify and develop effective coping behaviors will improve  Medication Management: Evaluate patient's response, side effects, and tolerance of medication regimen.  Therapeutic Interventions: 1 to 1 sessions, Unit Group sessions and Medication administration.  Evaluation of Outcomes: Not Met  Physician Treatment Plan for Secondary Diagnosis: Active Problems:   MDD (major depressive disorder), recurrent severe, without psychosis ([place])  Long Term Goal(s): Improvement in symptoms so as ready for discharge Improvement in symptoms so as ready for discharge   Short Term Goals: Ability to identify changes in lifestyle to reduce recurrence of condition will improve Ability to verbalize feelings will improve Ability to disclose and discuss suicidal ideas Ability to demonstrate self-control will improve Ability to identify and develop effective coping behaviors will improve     Medication Management: Evaluate patient's response, side effects, and tolerance of medication regimen.  Therapeutic Interventions: 1 to 1 sessions, Unit Group sessions and Medication administration.  Evaluation of Outcomes: Not Met   RN Treatment Plan for Primary Diagnosis: <principal problem not specified> Long Term Goal(s): Knowledge of disease and therapeutic regimen to maintain health will improve  Short Term Goals: Ability to participate in decision making will improve, Ability to disclose and discuss suicidal ideas, Ability to identify and develop effective coping behaviors will improve and Compliance with prescribed medications will improve  Medication Management: RN will administer medications as ordered by provider, will assess and evaluate patient's response and provide education to patient for prescribed medication. RN will report any  adverse and/or side effects to prescribing provider.  Therapeutic Interventions: 1 on 1 counseling sessions, Psychoeducation, Medication administration, Evaluate responses to treatment, Monitor vital signs and CBGs as ordered, Perform/monitor CIWA, COWS, AIMS and Fall Risk screenings as ordered, Perform wound care treatments as ordered.  Evaluation of Outcomes: Not Met   LCSW Treatment Plan for Primary Diagnosis: <principal problem not specified> Long Term Goal(s): Safe transition to appropriate next level of care at discharge, Engage patient in therapeutic group addressing interpersonal concerns.  Short Term Goals: Engage patient in aftercare planning with referrals and resources  Therapeutic Interventions: Assess for all discharge needs, 1 to 1 time with Social worker, Explore available resources and support systems, Assess for adequacy in community support network, Educate family and significant other(s) on suicide prevention, Complete Psychosocial Assessment, Interpersonal group therapy.  Evaluation of Outcomes: Not Met   Progress in Treatment: Attending groups: No. Participating in groups: No. Taking medication as prescribed: Yes. Toleration medication: Yes. Family/Significant other contact made: No, will contact:  no one, patient declined consent for collateral contacts Patient understands diagnosis: Yes. Discussing patient identified problems/goals with staff: Yes. Medical problems stabilized or resolved: Yes. Denies  suicidal/homicidal ideation: Yes. Issues/concerns per patient self-inventory: No. Other:   New problem(s) identified: None   New Short Term/Long Term Goal(s): Detox, medication stabilization, elimination of SI thoughts, development of comprehensive mental wellness plan.    Patient Goals: "Get into some good outpatient services"   Discharge Plan or Barriers: Patient plans ton return home with his uncle in [place], [place]. He will follow up with [facility] [place]  for medication management, therapy and SAIOP services.    Reason for Continuation of Hospitalization: Depression Medication stabilization  Estimated Length of Stay: 3-5 days   Attendees: Patient: Carlos French  04/14/2020 11:03 AM  Physician: Dr. Myles Lipps, MD 04/14/2020 11:03 AM  Nursing:  04/14/2020 11:03 AM  RN Care Manager: 04/14/2020 11:03 AM  Social Worker: Radonna Ricker, LCSW 04/14/2020 11:03 AM  Recreational Therapist:  04/14/2020 11:03 AM  Other:  04/14/2020 11:03 AM  Other:  04/14/2020 11:03 AM  Other: 04/14/2020 11:03 AM    Scribe for Treatment Team: Marylee Floras, [place] 04/14/2020 11:03 AM

## 2020-04-14 NOTE — Progress Notes (Signed)
Recreation Therapy Notes  Date:  4.21.21 Time: 0930 Location: 300 Hall Group Room  Group Topic: Stress Management  Goal Area(s) Addresses:  Patient will identify positive stress management techniques. Patient will identify benefits of using stress management post d/c.  Behavioral Response: Engaged  Intervention: Stress Management  Activity :  Guided Imagery.  LRT read a script that took patients on a walk along the beach.  Patients were to listen and follow along as script was read to engage in activity.  Education:  Stress Management, Discharge Planning.   Education Outcome: Acknowledges Education  Clinical Observations/Feedback: Pt attended and participated in activity.    Caroll Rancher, LRT/CTRS         Caroll Rancher A 04/14/2020 11:14 AM

## 2020-04-14 NOTE — Progress Notes (Signed)
   04/13/20 1944  Psych Admission Type (Psych Patients Only)  Admission Status Voluntary  Psychosocial Assessment  Patient Complaints Substance abuse  Eye Contact Fair  Facial Expression Anxious  Affect Anxious  Speech Logical/coherent  Interaction Assertive  Motor Activity Slow  Appearance/Hygiene Unremarkable  Behavior Characteristics Cooperative;Anxious  Mood Depressed;Anxious  Thought Process  Coherency WDL  Content WDL  Delusions None reported or observed  Perception WDL  Hallucination None reported or observed  Judgment Poor  Confusion None  Danger to Self  Current suicidal ideation? Denies  Danger to Others  Danger to Others None reported or observed   Pt seen at med window. Rates anxiety 5/10. Pt given Vistaril 25 mg at the time. Pt endorses withdrawal symptoms: shakes, sweats and anxiety.

## 2020-04-14 NOTE — Progress Notes (Signed)
D:  Patient's self inventory sheet, patient sleeps good, no sleep medication given.  Good appetite, normal energy level, good concentration.  Denied depression and hopeless, rated anxiety #2.  Denied withdrawals.  Denied SI.  Denied physical problems.  Denied physical pain.  Goal is discharge.  Plans to talk to MD.  Does have discharge plans. A:  Medications administered per MD orders.  Emotional support and encouragement given patient. R:  Denied SI and HI, contracts for safety.  Denied A/V hallucinations.  Safety maintained with 15 minute checks. '

## 2020-04-14 NOTE — Progress Notes (Signed)
[facility] Group Notes:  (Nursing/MHT/Case Management/Adjunct)  Date:  04/14/2020  Time:  2030  Type of Therapy:  wrap up group  Participation Level:  Active  Participation Quality:  Appropriate, Attentive, Sharing and Supportive  Affect:  Depressed  Cognitive:  Appropriate  Insight:  Improving  Engagement in Group:  Engaged  Modes of Intervention:  Clarification, Education and Support  Summary of Progress/Problems: Positive thinking and positive change were discussed.   Carlos French 04/14/2020, 9:26 PM

## 2020-04-14 NOTE — Plan of Care (Signed)
Nurse discussed anxiety, depression and coping skills with patient.  

## 2020-04-14 NOTE — Progress Notes (Signed)
[facility] MD Progress Note  04/14/2020 12:23 PM Carlos French  MRN:  [id number]  Subjective: Carlos French reports, "I'm doing really well than when I came to the hospital. I came to the hospital because I was dealing with a lot of deaths in my family. First, my baby-mama died, then my aunt's death followed. I got really very sad. I shut myself in my room & started drinking heavily for a whole month. I was struggling emotionally then. Then, I came to the hospital for detox. I'm feeling good today. I have no symptoms of depression at all & my anxiety symptoms are not really bad today. I slept well last night & my appetite has improved a lot. The good news is, I will be going to the IOP at [facility] in [place], [place] after discharge"..  Objective: Patient is a 29 year old male with a past psychiatric history significant of alcohol dependence, major depression versus substance-induced mood disorder and PTSD who presented to the [facility] emergency department on 04/12/2020 with his uncle secondary to heavy alcohol use.  The patient stated he had been drinking 1-2 cases of beer a day as well as hard liquor.  He stated that he has been drinking more over the last 2 months, and then will stay in bed all day long.  He stated that the mother of his biological daughter had died last month from a heroin overdose, and he had an aunt passed away after a pulmonary emboli last week.  The patient stated he was going to drink himself to death, and that he no longer cared.  His last psychiatric hospitalization at our facility was in 09/29/2018.  [name] is seen, chart reviewed. The chart findings discussed with the treatment team. He presents alert, oriented & aware of situation. He is sitting down on his bed. He is verbally responsive. He says he is doing very well today. He denies any alcohol withdrawal symptoms. Denies any symptoms of depression. Says his anxiety level is manageable today. He is visible on the unit,  attending group sessions. He denies any SIHI, AVH, delusional thoughts or paranoia. He does not appear to be responding to any internal stimuli. [name] is instructed & encouraged to continue to attend & participate in the group counseling sessions. He is happy that he will be attending the IOP for substance abuse treatment after discharge at the [facility] clinic in [place], [place]. He has agreed to continue current plan of care as already in progress. He denies side effects.  Principal Problem: Alcohol use disorder, severe, dependence (HCC)  Diagnosis:   Patient Active Problem List   Diagnosis Date Noted  . Alcohol use disorder, severe, dependence (HCC) [F10.20] 08/11/2015    Priority: High  . Alcohol abuse with alcohol-induced disorder (HCC) [F10.19]   . 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 PTSD (post-traumatic stress disorder) [F43.10] 09/16/2018  . MDD (major depressive disorder), recurrent severe, without psychosis (HCC) [F33.2] 11/15/2017  . MDD (major depressive disorder), recurrent, severe, with psychosis (HCC) [F33.3] 11/03/2017  . Depression [F32.9]   . Borderline personality disorder (HCC) [F60.3] 02/10/2016  . Substance induced mood disorder (HCC) [F19.94] 02/09/2016  . Alcohol withdrawal (HCC) [F10.239] 08/23/2015  . Tobacco use disorder [F17.200] 08/23/2015  . Cocaine use disorder, severe, dependence (HCC) [F14.20] 08/11/2015  . Cannabis use disorder, severe, dependence (HCC) [F12.20] 08/11/2015   Total Time spent with patient: 30 minutes  Past Psychiatric History: See H&P  Past Medical History:  Past Medical History:  Diagnosis Date  . Allergy   . Anxiety   . Bipolar 1 disorder ([place])   . Depression   . GERD (gastroesophageal reflux disease)   . GI (gastrointestinal bleed)   . GI bleeding   . PTSD (post-traumatic stress disorder)   . Suicidal ideation     Past Surgical History:  Procedure Laterality Date  . NO PAST SURGERIES     Family  History:  Family History  Problem Relation Age of Onset  . Stroke Mother    Family Psychiatric  History: See H&P.  Social History:  Social History   Substance and Sexual Activity  Alcohol Use Not Currently  . Alcohol/week: 84.0 standard drinks  . Types: 84 Cans of beer per week     Social History   Substance and Sexual Activity  Drug Use Yes  . Types: Marijuana    Social History   Socioeconomic History  . Marital status: Single    Spouse name: Not on file  . Number of children: Not on file  . Years of education: Not on file  . Highest education level: Not on file  Occupational History  . Not on file  Tobacco Use  . Smoking status: Current Every Day Smoker    Packs/day: 1.50    Types: Cigarettes  . Smokeless tobacco: Former Network engineer and Sexual Activity  . Alcohol use: Not Currently    Alcohol/week: 84.0 standard drinks    Types: 84 Cans of beer per week  . Drug use: Yes    Types: Marijuana  . Sexual activity: Yes    Birth control/protection: None  Other Topics Concern  . Not on file  Social History Narrative  . Not on file   Social Determinants of Health   Financial Resource Strain:   . Difficulty of Paying Living Expenses:   Food Insecurity:   . Worried About Charity fundraiser in the Last Year:   . Arboriculturist in the Last Year:   Transportation Needs:   . Film/video editor (Medical):   Marland Kitchen Lack of Transportation (Non-Medical):   Physical Activity:   . Days of Exercise per Week:   . Minutes of Exercise per Session:   Stress:   . Feeling of Stress :   Social Connections:   . Frequency of Communication with Friends and Family:   . Frequency of Social Gatherings with Friends and Family:   . Attends Religious Services:   . Active Member of Clubs or Organizations:   . Attends Archivist Meetings:   Marland Kitchen Marital Status:    Additional Social History:   Sleep: Good  Appetite:  Good  Current Medications: Current  Facility-Administered Medications  Medication Dose Route Frequency Provider Last Rate Last Admin  . alum & mag hydroxide-simeth (MAALOX/MYLANTA) 200-200-20 MG/5ML suspension 30 mL  30 mL Oral Q4H PRN Rankin, Shuvon B, NP      . feeding supplement (ENSURE ENLIVE) (ENSURE ENLIVE) liquid 237 mL  237 mL Oral Q24H Cobos, Fernando A, MD   237 mL at 04/14/20 1002  . folic acid (FOLVITE) tablet 1 mg  1 mg Oral Daily Sharma Covert, MD   1 mg at 04/14/20 0749  . hydrOXYzine (ATARAX/VISTARIL) tablet 25 mg  25 mg Oral Q6H PRN Rankin, Shuvon B, NP   25 mg at 04/14/20 0751  . loperamide (IMODIUM) capsule 2-4 mg  2-4 mg Oral PRN Rankin, Shuvon B, NP   2 mg at 04/12/20 2159  .  LORazepam (ATIVAN) tablet 1 mg  1 mg Oral Q6H PRN Rankin, Shuvon B, NP   1 mg at 04/13/20 0902  . magnesium hydroxide (MILK OF MAGNESIA) suspension 30 mL  30 mL Oral Daily PRN Rankin, Shuvon B, NP      . mirtazapine (REMERON) tablet 15 mg  15 mg Oral QHS Antonieta Pert, MD   15 mg at 04/13/20 2145  . multivitamin with minerals tablet 1 tablet  1 tablet Oral Daily Rankin, Shuvon B, NP   1 tablet at 04/14/20 0750  . nicotine polacrilex (NICORETTE) gum 2 mg  2 mg Oral PRN Antonieta Pert, MD   2 mg at 04/14/20 1000  . ondansetron (ZOFRAN-ODT) disintegrating tablet 4 mg  4 mg Oral Q6H PRN Rankin, Shuvon B, NP      . pantoprazole (PROTONIX) EC tablet 40 mg  40 mg Oral Daily Antonieta Pert, MD   40 mg at 04/14/20 0749  . thiamine tablet 100 mg  100 mg Oral Daily Rankin, Shuvon B, NP   100 mg at 04/14/20 0750  . traZODone (DESYREL) tablet 50 mg  50 mg Oral QHS PRN Rankin, Shuvon B, NP   50 mg at 04/13/20 2145   Lab Results:  Results for orders placed or performed during the hospital encounter of 04/12/20 (from the past 48 hour(s))  TSH     Status: None   Collection Time: 04/13/20  6:36 PM  Result Value Ref Range   TSH 1.607 0.350 - 4.500 uIU/mL    Comment: Performed by a 3rd Generation assay with a functional sensitivity of  <=0.01 uIU/mL. Performed at [facility], [address] [place], [place] [address]    Blood Alcohol level:  Lab Results  Component Value Date   ETH 171 (H) 04/12/2020   ETH 147 (H) 10/08/2018    Metabolic Disorder Labs: Lab Results  Component Value Date   HGBA1C 4.6 02/10/2016   Lab Results  Component Value Date   PROLACTIN 32.3 (H) 02/10/2016   Lab Results  Component Value Date   CHOL 209 (H) 02/10/2016   TRIG 120 02/10/2016   HDL 86 02/10/2016   CHOLHDL 2.4 02/10/2016   VLDL 24 02/10/2016   LDLCALC 99 02/10/2016   LDLCALC 100 (H) 08/12/2015   Physical Findings: AIMS: Facial and Oral Movements Muscles of Facial Expression: None, normal Lips and Perioral Area: None, normal Jaw: None, normal Tongue: None, normal,Extremity Movements Upper (arms, wrists, hands, fingers): None, normal Lower (legs, knees, ankles, toes): None, normal, Trunk Movements Neck, shoulders, hips: None, normal, Overall Severity Severity of abnormal movements (highest score from questions above): None, normal Incapacitation due to abnormal movements: None, normal Patient's awareness of abnormal movements (rate only patient's report): No Awareness, Dental Status Current problems with teeth and/or dentures?: No Does patient usually wear dentures?: No  CIWA:  CIWA-Ar Total: 0 COWS:  COWS Total Score: 10  Musculoskeletal: Strength & Muscle Tone: within normal limits Gait & Station: normal Patient leans: N/A  Psychiatric Specialty Exam: Physical Exam  Nursing note and vitals reviewed. Constitutional: He is oriented to person, place, and time. He appears well-developed.  HENT:  Head: Normocephalic.  Cardiovascular: Normal rate.  Respiratory: No respiratory distress. He has no wheezes.  Genitourinary:    Genitourinary Comments: Deferred   Musculoskeletal:        General: Normal range of motion.     Cervical back: Normal range of motion.  Neurological: He is alert and  oriented to person,  place, and time.  Skin: Skin is warm and dry.    Review of Systems  Constitutional: Negative for chills and diaphoresis.  HENT: Negative for congestion and sore throat.   Respiratory: Negative for cough, shortness of breath and wheezing.   Cardiovascular: Negative for chest pain and palpitations.  Gastrointestinal: Negative for diarrhea, heartburn, nausea and vomiting.  Genitourinary: Negative for dysuria.  Musculoskeletal: Negative for myalgias.  Skin: Negative for itching and rash.  Neurological: Negative for dizziness, tremors, seizures and headaches.  Endo/Heme/Allergies: Negative for environmental allergies.       Allergies: Acetaminophen & Ibuprofen.  Psychiatric/Behavioral: Positive for substance abuse (Hx. Alcohol use disorder ). Negative for depression ("Improving"), hallucinations, memory loss and suicidal ideas. The patient is not nervous/anxious and does not have insomnia.     Blood pressure 113/82, pulse 88, temperature 97.7 F (36.5 C), temperature source Oral, resp. rate 16, height 5\' 6"  (1.676 m), weight 54.4 kg, SpO2 100 %.Body mass index is 19.37 kg/m.  Eye Contact:  Fair  Speech:  Normal Rate  Volume: Normal.  Mood: "I'm feeling a lot better today. No depression or anxiety".  Affect: Appropriate, reactive.  Thought Process:  Coherent and Descriptions of Associations: Intact  Orientation:  Full (Time, Place, and Person)  Thought Content:  Logical  Suicidal Thoughts:  No  Homicidal Thoughts:  No  Memory:  Immediate;   Fair Recent;   Fair Remote;   Fair  Judgement:  Intact  Insight:  Fair  Psychomotor Activity:  Decreased  Concentration:  Concentration: Fair and Attention Span: Fair  Recall:  of Knowledge:  Fair  Language:  Good  Akathisia:  Negative  Handed:  Right  AIMS (if indicated):     Assets:  Desire for Improvement Resilience  ADL's:  Intact  Cognition:  WNL    Sleep:  Number of Hours: 6.25     Treatment Plan  Summary: Daily contact with patient to assess and evaluate symptoms and progress in treatment and Medication management.  - Continue inpatient hospitalization.  - Will continue today 04/14/2020 plan as below except where it is noted.  Alcohol detox.     - Continue the Ativan 1 mg po Q 6 hrs prn for CIWA > 10.     - Continue Vistaril 25 mg po Q 6 hrs prn for CIWA > 10.  Anxiety.      - Continue Vistaril 25 mg po Q 6 hrs prn.  Depression.     - Continue Mirtazapine 15 mg po Q hs.  Insomnia.     -  Continue Trazodone 50 mg po Q hs prn.     -  Continue Mirtazapine 15 mg po Q hs.  GERD.    - Continue Protonix 40 mg po daily.  Nicotine withdrawal.    - Continue Nicorette gum 25 mg prn.  Supplements.    - Continue Folic acid 1 mg po daily for Folate deficiency.    -  Continue Thiamine 100 mg po for thiamine deficiency.    -  Continue Ensure 237 ml Q 24 hrs.  Patient to attend & participate in the group counseling sessions.  Discharge disposition plan ongoing.    04/16/2020, NP, PMHNP, FNP-BC. 04/14/2020, 12:23 PMPatient ID: 04/16/2020, male   DOB: Oct 22, 1991, 29 y.o.   MRN: [id number]

## 2020-04-15 MED ORDER — HYDROXYZINE HCL 25 MG PO TABS: 25.0000 mg | ORAL_TABLET | Freq: Four times a day (QID) | ORAL | 0 refills | Status: DC | PRN

## 2020-04-15 MED ORDER — TRAZODONE HCL 50 MG PO TABS: 50.0000 mg | ORAL_TABLET | Freq: Every evening | ORAL | 0 refills | Status: DC | PRN

## 2020-04-15 MED ORDER — MIRTAZAPINE 15 MG PO TABS: 15.0000 mg | ORAL_TABLET | Freq: Every day | ORAL | 0 refills | Status: DC

## 2020-04-15 MED ORDER — NICOTINE POLACRILEX 2 MG MT GUM: 2.0000 mg | CHEWING_GUM | OROMUCOSAL | 0 refills | Status: DC | PRN

## 2020-04-15 NOTE — Progress Notes (Signed)
  [facility] Adult Case Management Discharge Plan :  Will you be returning to the same living situation after discharge:  Yes,  patient is returning home with his uncle At discharge, do you have transportation home?: Yes,  patient's mother is picking him up Do you have the ability to pay for your medications: No.  Release of information consent forms completed and in the chart;  Patient's signature needed at discharge.  Patient to Follow up at: Follow-up Information    [facility]. Go on 04/19/2020.   Why: You are scheduled for an appointment on 04/19/20 at 8:30 am for outpatient services.  This appointment will be held in person. Substance abuse intensive outpatient treatment will be established during this visit. Contact information: [address] [place] [address] [phone]           Next level of care provider has access to [facility] Link:yes  Safety Planning and Suicide Prevention discussed: Yes,  with the patient  Have you used any form of tobacco in the last 30 days? (Cigarettes, Smokeless Tobacco, Cigars, and/or Pipes): Yes  Has patient been referred to the Quitline?: Patient refused referral  Patient has been referred for addiction treatment: Yes  Maeola Sarah, LCSWA 04/15/2020, 9:54 AM

## 2020-04-15 NOTE — Progress Notes (Signed)
  [facility] Adult Case Management Discharge Plan :  Will you be returning to the same living situation after discharge:  Yes,  patient is returning home with his uncle At discharge, do you have transportation home?: Yes,  patient's mother is picking him up Do you have the ability to pay for your medications: No.  Release of information consent forms completed and in the chart;  Patient's signature needed at discharge.  Patient to Follow up at: Follow-up Information    [facility]. Go on 04/19/2020.   Why: You are scheduled for an appointment on 04/19/20 at 8:30 am.  This appointment will be held in person.  At that time they will will substance abuse intensive outpatient treatment. Contact information: [address] [place] [address] [phone]           Next level of care provider has access to [facility] Link:yes  Safety Planning and Suicide Prevention discussed: Yes,  with the patient  Have you used any form of tobacco in the last 30 days? (Cigarettes, Smokeless Tobacco, Cigars, and/or Pipes): Yes  Has patient been referred to the Quitline?: Patient refused referral  Patient has been referred for addiction treatment: Yes  Maeola Sarah, LCSWA 04/15/2020, 9:52 AM

## 2020-04-15 NOTE — [facility]HH Suicide Risk Assessment (Signed)
[facility] Discharge Suicide Risk Assessment   Principal Problem: Alcohol use disorder, severe, dependence (HCC) Discharge Diagnoses: Principal Problem:   Alcohol use disorder, severe, dependence (HCC) Active Problems:   MDD (major depressive disorder), recurrent severe, without psychosis (HCC)   Total Time spent with patient: 15 minutes  Musculoskeletal: Strength & Muscle Tone: within normal limits Gait & Station: normal Patient leans: N/A  Psychiatric Specialty Exam: Review of Systems  All other systems reviewed and are negative.   Blood pressure (!) 125/99, pulse 79, temperature (!) 97.4 F (36.3 C), temperature source Oral, resp. rate 18, height 5\' 6"  (1.676 m), weight 54.4 kg, SpO2 99 %.Body mass index is 19.37 kg/m.  General Appearance: Casual  Eye Contact::  Good  Speech:  Normal Rate409  Volume:  Normal  Mood:  Euthymic  Affect:  Congruent  Thought Process:  Coherent and Descriptions of Associations: Intact  Orientation:  Full (Time, Place, and Person)  Thought Content:  Logical  Suicidal Thoughts:  No  Homicidal Thoughts:  No  Memory:  Immediate;   Good Recent;   Good Remote;   Good  Judgement:  Intact  Insight:  Fair  Psychomotor Activity:  Normal  Concentration:  Good  Recall:  Good  Fund of Knowledge:Fair  Language: Good  Akathisia:  Negative  Handed:  Right  AIMS (if indicated):     Assets:  Desire for Improvement Resilience  Sleep:  Number of Hours: 2  Cognition: WNL  ADL's:  Intact   Mental Status Per Nursing Assessment::   On Admission:  NA  Demographic Factors:  Male, Divorced or widowed, Low socioeconomic status and Unemployed  Loss Factors: Financial problems/change in socioeconomic status  Historical Factors: Impulsivity  Risk Reduction Factors:   Sense of responsibility to family and Living with another person, especially a relative  Continued Clinical Symptoms:  Depression:   Comorbid alcohol  abuse/dependence Impulsivity Alcohol/Substance Abuse/Dependencies  Cognitive Features That Contribute To Risk:  None    Suicide Risk:  Minimal: No identifiable suicidal ideation.  Patients presenting with no risk factors but with morbid ruminations; may be classified as minimal risk based on the severity of the depressive symptoms  Follow-up Information    [facility]. Go on 04/19/2020.   Why: You are scheduled for an appointment on 04/19/20 at 8:30 am.  This appointment will be held in person.  At that time they will will substance abuse intensive outpatient treatment. Contact information: [address] [place] [address]           Plan Of Care/Follow-up recommendations:  Activity:  ad lib  [phone], MD 04/15/2020, 8:24 AM

## 2020-04-15 NOTE — Progress Notes (Signed)
   04/15/20 1007  Psych Admission Type (Psych Patients Only)  Admission Status Voluntary  Psychosocial Assessment  Patient Complaints Substance abuse  Eye Contact Fair  Facial Expression Anxious  Affect Anxious  Speech Logical/coherent  Interaction Assertive  Motor Activity Slow  Appearance/Hygiene Unremarkable  Thought Process  Coherency WDL  Content WDL  Delusions None reported or observed  Perception WDL  Hallucination None reported or observed  Judgment Poor  Confusion None  Danger to Self  Current suicidal ideation? Denies  Danger to Others  Danger to Others None reported or observed

## 2020-04-15 NOTE — Discharge Summary (Signed)
Physician Discharge Summary Note  Patient:  Carlos French is an 29 y.o., male  MRN:  [id number]  DOB:  23-Jun-1991  Patient phone:  [phone] (home)   Patient address:   [address] [place] [address],   Total Time spent with patient: Greater than 30 minutes  Date of Admission:  04/12/2020  Date of Discharge: 04/15/20  Reason for Admission: Worsening alcohol consumption triggered by deaths of family members.  Principal Problem: Alcohol use disorder, severe, dependence [facility]  Discharge Diagnoses: Patient Active Problem List   Diagnosis Date Noted  . Alcohol use disorder, severe, dependence (HCC) [F10.20] 08/11/2015    Priority: High  . Alcohol abuse with alcohol-induced disorder (HCC) [F10.19]   . 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 PTSD (post-traumatic stress disorder) [F43.10] 09/16/2018  . MDD (major depressive disorder), recurrent severe, without psychosis (HCC) [F33.2] 11/15/2017  . MDD (major depressive disorder), recurrent, severe, with psychosis (HCC) [F33.3] 11/03/2017  . Depression [F32.9]   . Borderline personality disorder (HCC) [F60.3] 02/10/2016  . Substance induced mood disorder (HCC) [F19.94] 02/09/2016  . Alcohol withdrawal (HCC) [F10.239] 08/23/2015  . Tobacco use disorder [F17.200] 08/23/2015  . Cocaine use disorder, severe, dependence (HCC) [F14.20] 08/11/2015  . Cannabis use disorder, severe, dependence (HCC) [F12.20] 08/11/2015   Past Psychiatric History: Anxiety, Depression, PTSD, Bipolar, Alcohol abuse, cocaine abuse, SUD  Past Medical History:  Past Medical History:  Diagnosis Date  . Allergy   . Anxiety   . Bipolar 1 disorder (HCC)   . Depression   . GERD (gastroesophageal reflux disease)   . GI (gastrointestinal bleed)   . GI bleeding   . PTSD (post-traumatic stress disorder)   . Suicidal ideation     Past Surgical History:  Procedure Laterality Date  . NO PAST SURGERIES     Family History:  Family  History  Problem Relation Age of Onset  . Stroke Mother    Family Psychiatric  History: Mom- PTSD, Depression, Anxiety  Social History:  Social History   Substance and Sexual Activity  Alcohol Use Not Currently  . Alcohol/week: 84.0 standard drinks  . Types: 84 Cans of beer per week     Social History   Substance and Sexual Activity  Drug Use Yes  . Types: Marijuana    Social History   Socioeconomic History  . Marital status: Single    Spouse name: Not on file  . Number of children: Not on file  . Years of education: Not on file  . Highest education level: Not on file  Occupational History  . Not on file  Tobacco Use  . Smoking status: Current Every Day Smoker    Packs/day: 1.50    Types: Cigarettes  . Smokeless tobacco: Former Engineer, water and Sexual Activity  . Alcohol use: Not Currently    Alcohol/week: 84.0 standard drinks    Types: 84 Cans of beer per week  . Drug use: Yes    Types: Marijuana  . Sexual activity: Yes    Birth control/protection: None  Other Topics Concern  . Not on file  Social History Narrative  . Not on file   Social Determinants of Health   Financial Resource Strain:   . Difficulty of Paying Living Expenses:   Food Insecurity:   . Worried About Programme researcher, broadcasting/film/video in the Last Year:   . Barista in the Last Year:   Transportation Needs:   . Freight forwarder (Medical):   Marland Kitchen  Lack of Transportation (Non-Medical):   Physical Activity:   . Days of Exercise per Week:   . Minutes of Exercise per Session:   Stress:   . Feeling of Stress :   Social Connections:   . Frequency of Communication with Friends and Family:   . Frequency of Social Gatherings with Friends and Family:   . Attends Religious Services:   . Active Member of Clubs or Organizations:   . Attends Archivist Meetings:   Marland Kitchen Marital Status:    Hospital Course: (Per Md's admission evaluation notes): Patient is a 29 year old male with a past  psychiatric history significant for alcohol dependence, major depression/substance-induced mood disorder & PTSD who presented to the [facility] emergency department on 04/12/2020 with his uncle secondary to heavy alcohol use.  The patient stated he had been drinking 1-2 cases of beer a day as well as hard liquor.  He stated that he has been drinking more over the last 2 months, and then will stay in bed all day long.  He stated that the mother of his biological daughter had died last month from a heroin overdose, and he had an aunt passed away after a pulmonary emboli last week.  The patient stated he was going to drink himself to death, and that he no longer cared.  His last psychiatric hospitalization at our facility was in 09/29/2018.  Diagnosis at that time included major depression, alcohol use disorder and borderline personality disorder.  He had been suicidal at that time with impulsivity to jump out of a moving car.  Besides his alcohol use he admitted to cannabis use but no other substances.  His discharge medications at that time included hydroxyzine, lithium carbonate, mirtazapine, olanzapine, ramelteon and trazodone.  He stated his last residential treatment program was approximately a year ago.  He declined residential treatment at this point.  He stated that he wanted to have an outpatient program.  Review of the electronic medical record shows that the last time and any of those medications were prescribed was on his hospitalization here in 2019.  He was admitted to the hospital for evaluation and stabilization.  This is one of several psychiatric discharge summaries for from this [facility] & other  systems for this 29 year old Caucasian male with hx of polysubstance use disorders, substance induced mood disorder & multiple psychiatric admissions. He is known in this [facility] for worsening symptoms of mood disorder, alcoholism & other substance use disorders. He has been tried on  multiple psychotropic medications for his symptoms & it appeared Reece is likely not compliant with his treatment regimen. He was brought to the [facility] this time around for evaluation for worsening alcoholism & suicidal ideations which he stated triggered by recent death of family members. He was admitted for mood stabilization & alcohol detoxification treatments.  After evaluation of his presenting symptoms, [facility] was recommended for mood stabilization & alcohol detoxification treatments. The medication regimen for his presenting symptoms were discussed & with his consent initiated. He received, stabilized & was discharged on the medications as listed below on his discharge medication lists. He did received Ativan 1 mg on a prn basis for alcohol withdrawal symptoms. He was also enrolled & participated in the group counseling sessions being offered & held on this unit. He learned coping skills. He presented on this admission, no other chronic significant medical conditions that required treatment & monitoring. He tolerated his treatment regimen without any adverse effects or reactions  reported.  During the course of his hospitalization, the 15-minute checks were adequate to ensure Kiran's safety. Patient did not display any dangerous, violent or suicidal behavior on the unit. He interacted with patients & staff appropriately, participated appropriately in the group sessions/therapies. His medications were addressed & adjusted to meet his needs. He was recommended for an outpatient follow-up care & medication management upon discharge to assure his continuity of care.  At the time of discharge today, patient is not reporting any acute suicidal/homicidal ideations. He feels more confident about his self-care & in managing his symptoms. He currently denies any new issues or concerns. Education and supportive counseling provided throughout her hospital stay & upon discharge. He will be attending the IONP at the [facility]  clinic in [place], [place].  Today upon his discharge evaluation with the attending psychiatrist, Carole shares he is doing well. He denies any other specific concerns. He is sleeping well. His appetite is good. He denies other physical complaints. He denies AH/VH. He feels that his medications have been helpful & is in agreement to continue his current treatment regimen as recommended. He was able to engage in safety planning including plan to return to [facility] or contact emergency services if he feels unable to maintain his own safety or the safety of others. Pt had no further questions, comments, or concerns. He left [facility] with all personal belongings in no apparent distress. Transportation per patient's mother.  Physical Findings: AIMS: Facial and Oral Movements Muscles of Facial Expression: None, normal Lips and Perioral Area: None, normal Jaw: None, normal Tongue: None, normal,Extremity Movements Upper (arms, wrists, hands, fingers): None, normal Lower (legs, knees, ankles, toes): None, normal, Trunk Movements Neck, shoulders, hips: None, normal, Overall Severity Severity of abnormal movements (highest score from questions above): None, normal Incapacitation due to abnormal movements: None, normal Patient's awareness of abnormal movements (rate only patient's report): No Awareness, Dental Status Current problems with teeth and/or dentures?: No Does patient usually wear dentures?: No  CIWA:  CIWA-Ar Total: 0 COWS:  COWS Total Score: 10  Musculoskeletal: Strength & Muscle Tone: within normal limits Gait & Station: normal Patient leans: N/A  Psychiatric Specialty Exam: Physical Exam  Nursing note and vitals reviewed. Constitutional: He is oriented to person, place, and time. He appears well-developed and well-nourished.  HENT:  Head: Normocephalic.  Eyes: Pupils are equal, round, and reactive to light.  Cardiovascular: Normal rate.  Respiratory: Effort normal. No respiratory distress. He  has no wheezes.  GI: Soft.  Genitourinary:    Genitourinary Comments: Deferred   Musculoskeletal:        General: Normal range of motion.     Cervical back: Normal range of motion.  Neurological: He is alert and oriented to person, place, and time.  Skin: Skin is warm and dry.    Review of Systems  Constitutional: Negative.  Negative for chills, diaphoresis and fever.  HENT: Negative.   Eyes: Negative.   Respiratory: Negative.  Negative for cough, shortness of breath and wheezing.   Cardiovascular: Negative.  Negative for chest pain and palpitations.  Gastrointestinal: Negative.  Negative for diarrhea, heartburn, nausea and vomiting.  Genitourinary: Negative.   Musculoskeletal: Negative.  Negative for back pain and myalgias.  Skin: Negative.  Negative for itching and rash.  Neurological: Negative.  Negative for dizziness, tremors, seizures, weakness and headaches.  Endo/Heme/Allergies: Negative.  Negative for environmental allergies. Does not bruise/bleed easily.       Allergies: Acetaminophen, Ibuprofen  Psychiatric/Behavioral: Positive for hallucinations (Hx.  psychosis (Stable)) and substance abuse (Hx. polysubstance use disorder (stable)). Negative for depression, memory loss and suicidal ideas. The patient has insomnia (Stabilized with medication prior to discharge.). The patient is not nervous/anxious (Stable).     Blood pressure (!) 125/99, pulse 79, temperature (!) 97.4 F (36.3 C), temperature source Oral, resp. rate 18, height 5\' 6"  (1.676 m), weight 54.4 kg, SpO2 99 %.Body mass index is 19.37 kg/m.  See Md's SRA   Has this patient used any form of tobacco in the last 30 days? (Cigarettes, Smokeless Tobacco, Cigars, and/or Pipes): Yes, an FDA-approved tobacco cessation medication was offered at discharge.  Blood Alcohol level:  Lab Results  Component Value Date   ETH 171 (H) 04/12/2020   ETH 147 (H) 10/08/2018   Metabolic Disorder Labs:  Lab Results  Component Value  Date   HGBA1C 4.6 02/10/2016   Lab Results  Component Value Date   PROLACTIN 32.3 (H) 02/10/2016   Lab Results  Component Value Date   CHOL 209 (H) 02/10/2016   TRIG 120 02/10/2016   HDL 86 02/10/2016   CHOLHDL 2.4 02/10/2016   VLDL 24 02/10/2016   LDLCALC 99 02/10/2016   LDLCALC 100 (H) 08/12/2015   See Psychiatric Specialty Exam and Suicide Risk Assessment completed by Attending Physician prior to discharge.  Discharge destination:  Home  Is patient on multiple antipsychotic therapies at discharge:  No   Has Patient had three or more failed trials of antipsychotic monotherapy by history:  No  Recommended Plan for Multiple Antipsychotic Therapies: NA  Allergies as of 04/15/2020      Reactions   Acetaminophen Other (See Comments)   Due to elevated liver enzymes   Ibuprofen Other (See Comments)   History of GI bleed      Medication List    STOP taking these medications   calcium carbonate 750 MG chewable tablet Commonly known as: TUMS EX   lithium carbonate 300 MG CR tablet Commonly known as: LITHOBID   OLANZapine 10 MG tablet Commonly known as: ZYPREXA   ramelteon 8 MG tablet Commonly known as: ROZEREM     TAKE these medications     Indication  hydrOXYzine 25 MG tablet Commonly known as: ATARAX/VISTARIL Take 1 tablet (25 mg total) by mouth every 6 (six) hours as needed. For anxiety What changed:   medication strength  how much to take  reasons to take this  additional instructions  Indication: Feeling Anxious   mirtazapine 15 MG tablet Commonly known as: REMERON Take 1 tablet (15 mg total) by mouth at bedtime. For depression/sleep What changed: additional instructions  Indication: Major Depressive Disorder, Insomnia   multivitamin with minerals Tabs tablet Take 1 tablet by mouth daily.  Indication: Vitamine supplementation   nicotine polacrilex 2 MG gum Commonly known as: NICORETTE Take 1 each (2 mg total) by mouth as needed. (May buy from  over the counter): For smoking cessation  Indication: Nicotine Addiction   traZODone 50 MG tablet Commonly known as: DESYREL Take 1 tablet (50 mg total) by mouth at bedtime as needed for sleep.  Indication: Trouble Sleeping      Follow-up 04/17/2020, [employer]. Go on 04/19/2020.   Why: You are scheduled for an appointment on 04/19/20 at 8:30 am for outpatient services.  This appointment will be held in person. Substance abuse intensive outpatient treatment will be established during this visit. Contact information: [address] [place] [phone]  Follow-up recommendations: Activity:  As tolerated Diet: As recommended by your primary care doctor. Keep all scheduled follow-up appointments as recommended.   Comments: Prescriptions given at discharge.  Patient agreeable to plan.  Given opportunity to ask questions.  Appears to feel comfortable with discharge denies any current suicidal or homicidal thought. Patient is also instructed prior to discharge to: Take all medications as prescribed by his/her mental healthcare provider. Report any adverse effects and or reactions from the medicines to his/her outpatient provider promptly. Patient has been instructed & cautioned: To not engage in alcohol and or illegal drug use while on prescription medicines. In the event of worsening symptoms, patient is instructed to call the crisis hotline, 911 and or go to the nearest ED for appropriate evaluation and treatment of symptoms. To follow-up with his/her primary care provider for your other medical issues, concerns and or health care needs.    Signed: Armandina StammerAgnes Lusero Nordlund, NP, PMHNP, FNP-BC 04/15/2020, 9:56 AM

## 2020-04-15 NOTE — Progress Notes (Signed)
Pt discharged to lobby. Pt was stable and appreciative at that time. All papers, samples and prescriptions were given and valuables returned. Verbal understanding expressed. Denies SI/HI and A/VH. Pt given opportunity to express concerns and ask questions.  

## 2020-09-23 ENCOUNTER — Other Ambulatory Visit: Payer: Medicaid Other

## 2020-09-23 DIAGNOSIS — Z20822 Contact with and (suspected) exposure to covid-19: Secondary | ICD-10-CM

## 2020-09-24 LAB — NOVEL CORONAVIRUS, NAA: SARS-CoV-2, NAA: NOT DETECTED

## 2020-09-24 LAB — SARS-COV-2, NAA 2 DAY TAT

## 2020-10-06 ENCOUNTER — Other Ambulatory Visit: Payer: Medicaid Other

## 2020-10-06 DIAGNOSIS — Z20822 Contact with and (suspected) exposure to covid-19: Secondary | ICD-10-CM

## 2020-10-07 LAB — NOVEL CORONAVIRUS, NAA: SARS-CoV-2, NAA: NOT DETECTED

## 2020-10-07 LAB — SARS-COV-2, NAA 2 DAY TAT

## 2020-11-29 ENCOUNTER — Encounter: Payer: Self-pay | Admitting: Emergency Medicine

## 2020-11-29 ENCOUNTER — Ambulatory Visit
Admission: EM | Admit: 2020-11-29 | Discharge: 2020-11-29 | Disposition: A | Discharge status: Home / Self Care | Payer: Self-pay | Attending: Emergency Medicine | Admitting: Emergency Medicine

## 2020-11-29 DIAGNOSIS — F1019 Alcohol abuse with unspecified alcohol-induced disorder: Secondary | ICD-10-CM

## 2020-11-29 DIAGNOSIS — R1011 Right upper quadrant pain: Secondary | ICD-10-CM

## 2020-11-29 MED ORDER — OMEPRAZOLE 20 MG PO CPDR: 20.0000 mg | DELAYED_RELEASE_CAPSULE | Freq: Two times a day (BID) | ORAL | 0 refills | Status: DC

## 2020-11-29 MED ORDER — ONDANSETRON 4 MG PO TBDP: 4.0000 mg | ORAL_TABLET | Freq: Three times a day (TID) | ORAL | 0 refills | Status: DC | PRN

## 2020-11-29 NOTE — ED Provider Notes (Signed)
[facility]    CSN: [id number] Arrival date & time: 11/29/20  1147      History   Chief Complaint Chief Complaint  Patient presents with  . Abdominal Pain    HPI Carlos French is a 29 y.o. male history of substance abuse, prior GI bleeds, presenting today for evaluation of right upper quadrant pain and nausea.  Patient reports that he has significant history of alcohol use, reports approximately 12-18 beers per day.  He has previously been evaluated in the emergency room and noted to have elevated liver enzymes.  He was recommended to stop drinking at this time.  He reports over the past few weeks he has had worsening symptoms notable for nausea in the morning, diarrhea frequently as well as intermittent right upper quadrant abdominal pain.  Pain will typically last for 5 to 10 minutes and is throbbing in nature.  Pain has eased off of currently and denies at time of evaluation.  Is also had associated fatigue.  Reports approximately 1 to 2 pack/day tobacco use.  Denies blood in stool.  Denies chest pain or shortness of breath.     HPI  Past Medical History:  Diagnosis Date  . Allergy   . Anxiety   . Bipolar 1 disorder (HCC)   . Depression   . GERD (gastroesophageal reflux disease)   . GI (gastrointestinal bleed)   . GI bleeding   . PTSD (post-traumatic stress disorder)   . Suicidal ideation     Patient Active Problem List   Diagnosis Date Noted  . Alcohol abuse with alcohol-induced disorder (HCC)   . Moderate cannabis use disorder (HCC) 09/30/2018  . PTSD (post-traumatic stress disorder) 09/16/2018  . MDD (major depressive disorder), recurrent severe, without psychosis (HCC) 11/15/2017  . MDD (major depressive disorder), recurrent, severe, with psychosis (HCC) 11/03/2017  . Depression   . Borderline personality disorder (HCC) 02/10/2016  . Substance induced mood disorder (HCC) 02/09/2016  . Alcohol withdrawal (HCC) 08/23/2015  . Tobacco use disorder  08/23/2015  . Cocaine use disorder, severe, dependence (HCC) 08/11/2015  . Alcohol use disorder, severe, dependence (HCC) 08/11/2015  . Cannabis use disorder, severe, dependence (HCC) 08/11/2015    Past Surgical History:  Procedure Laterality Date  . NO PAST SURGERIES         Home Medications    Prior to Admission medications   Medication Sig Start Date End Date Taking? Authorizing Provider  hydrOXYzine (ATARAX/VISTARIL) 25 MG tablet Take 1 tablet (25 mg total) by mouth every 6 (six) hours as needed. For anxiety 04/15/20   Armandina Stammer I, NP  mirtazapine (REMERON) 15 MG tablet Take 1 tablet (15 mg total) by mouth at bedtime. For depression/sleep 04/15/20   Armandina Stammer I, NP  Multiple Vitamin (MULTIVITAMIN WITH MINERALS) TABS tablet Take 1 tablet by mouth daily.    [provider]  nicotine polacrilex (NICORETTE) 2 MG gum Take 1 each (2 mg total) by mouth as needed. (May buy from over the counter): For smoking cessation 04/15/20   Armandina Stammer I, NP  omeprazole (PRILOSEC) 20 MG capsule Take 1 capsule (20 mg total) by mouth 2 (two) times daily before a meal. 11/29/20   Kieren Ricci C, PA-C  ondansetron (ZOFRAN ODT) 4 MG disintegrating tablet Take 1 tablet (4 mg total) by mouth every 8 (eight) hours as needed for nausea or vomiting. 11/29/20   Devarious Pavek C, PA-C  traZODone (DESYREL) 50 MG tablet Take 1 tablet (50 mg total) by mouth  at bedtime as needed for sleep. 04/15/20   Sanjuana Kava, NP    Family History Family History  Problem Relation Age of Onset  . Stroke Mother     Social History Social History   Tobacco Use  . Smoking status: Current Every Day Smoker    Packs/day: 1.50    Types: Cigarettes  . Smokeless tobacco: Former Clinical biochemist  . Vaping Use: Never used  Substance Use Topics  . Alcohol use: Not Currently    Alcohol/week: 84.0 standard drinks    Types: 84 Cans of beer per week  . Drug use: Yes    Types: Marijuana     Allergies     Acetaminophen and Ibuprofen   Review of Systems Review of Systems  Constitutional: Positive for fatigue. Negative for activity change, appetite change, chills and fever.  HENT: Negative for congestion, ear pain, rhinorrhea, sinus pressure, sore throat and trouble swallowing.   Eyes: Negative for discharge and redness.  Respiratory: Negative for cough, chest tightness and shortness of breath.   Cardiovascular: Negative for chest pain.  Gastrointestinal: Positive for abdominal pain, diarrhea and nausea. Negative for vomiting.  Musculoskeletal: Negative for myalgias.  Skin: Negative for rash.  Neurological: Negative for dizziness, light-headedness and headaches.     Physical Exam Triage Vital Signs ED Triage Vitals  Enc Vitals Group     BP 11/29/20 1233 132/84     Pulse Rate 11/29/20 1233 82     Resp 11/29/20 1233 18     Temp 11/29/20 1234 98.6 F (37 C)     Temp Source 11/29/20 1234 Oral     SpO2 11/29/20 1233 96 %     Weight 11/29/20 1234 125 lb (56.7 kg)     Height 11/29/20 1234 5\' 5"  (1.651 m)     Head Circumference --      Peak Flow --      Pain Score --      Pain Loc --      Pain Edu? --      Excl. in GC? --    No data found.  Updated Vital Signs BP 132/84 (BP Location: Right Arm)   Pulse 82   Temp 98.6 F (37 C) (Oral)   Resp 18   Ht 5\' 5"  (1.651 m)   Wt 125 lb (56.7 kg)   SpO2 96%   BMI 20.80 kg/m   Visual Acuity Right Eye Distance:   Left Eye Distance:   Bilateral Distance:    Right Eye Near:   Left Eye Near:    Bilateral Near:     Physical Exam Vitals and nursing note reviewed.  Constitutional:      Appearance: He is well-developed.     Comments: No acute distress  HENT:     Head: Normocephalic and atraumatic.     Nose: Nose normal.     Mouth/Throat:     Comments: Oral mucosa pink and moist, no tonsillar enlargement or exudate. Posterior pharynx patent and nonerythematous, no uvula deviation or swelling. Normal phonation. Eyes:      Conjunctiva/sclera: Conjunctivae normal.  Cardiovascular:     Rate and Rhythm: Normal rate.  Pulmonary:     Effort: Pulmonary effort is normal. No respiratory distress.     Comments: Breathing comfortably at rest, CTABL, no wheezing, rales or other adventitious sounds auscultated Abdominal:     General: There is no distension.     Comments: Soft, nondistended, mild tenderness to palpation to right upper quadrant,  negative rebound, negative Murphy's  Musculoskeletal:        General: Normal range of motion.     Cervical back: Neck supple.  Skin:    General: Skin is warm and dry.  Neurological:     Mental Status: He is alert and oriented to person, place, and time.      UC Treatments / Results  Labs (all labs ordered are listed, but only abnormal results are displayed) Labs Reviewed  COMPREHENSIVE METABOLIC PANEL - Abnormal; Notable for the following components:      Result Value   BUN 5 (*)    Creatinine, Ser 0.74 (*)    BUN/Creatinine Ratio 7 (*)    Chloride 94 (*)    AST 122 (*)    ALT 136 (*)    All other components within normal limits   Narrative:    Performed at:  [address] [place], [place]  [id number] Lab Director: Jolene Schimke MD, Phone:  [phone]  CBC WITH DIFFERENTIAL/PLATELET - Abnormal; Notable for the following components:   MCH 33.7 (*)    All other components within normal limits   Narrative:    Performed at:  [address], [place], [place]  [id number] Lab Director: Jolene Schimke MD, Phone:  [phone]  LIPASE   Narrative:    Performed at:  [address] [place] [address], [place], [place]  [id number] Lab Director: Jolene Schimke MD, Phone:  [phone]    EKG   Radiology No results found.  Procedures Procedures (including critical care time)  Medications Ordered in UC Medications - No data to display  Initial Impression / Assessment and Plan / UC Course  I have reviewed the  triage vital signs and the nursing notes.  Pertinent labs & imaging results that were available during my care of the patient were reviewed by me and considered in my medical decision making (see chart for details).    Right upper quadrant abdominal pain in setting of alcohol abuse, may have some underlying gastritis, but given prior transaminitis will reevaluate labs today and will set up referral to gastroenterology for further evaluation of possible underlying cirrhosis/hepatitis.  Discussed also establishing care with PCP.  Treating symptomatically and supportively in the meantime with Zofran, omeprazole, provided resources for alcohol cessation.  If patient developing worsening symptoms to follow-up in the emergency room.  Final Clinical Impressions(s) / UC Diagnoses   Final diagnoses:  RUQ abdominal pain  Alcohol abuse with alcohol-induced disorder [facility])     Discharge Instructions     Blood work pending Referral to gastroneterology placed- they should call you Set up a primary care- contacts below- please call them Begin omeprazole twice daily Zofran as needed for nausea Be sure to drink plenty of fluids Please go to emergency room if developing persistent or worsening pain, worsening fatigue lightheadedness, dizziness    ED Prescriptions    Medication Sig Dispense Auth. Provider   omeprazole (PRILOSEC) 20 MG capsule Take 1 capsule (20 mg total) by mouth 2 (two) times daily before a meal. 30 capsule Nakiea Metzner C, PA-C   ondansetron (ZOFRAN ODT) 4 MG disintegrating tablet Take 1 tablet (4 mg total) by mouth every 8 (eight) hours as needed for nausea or vomiting. 20 tablet Anaalicia Reimann, [place] C, PA-C     PDMP not reviewed this encounter.   Sharyon Cable [place] C, [place] 11/30/20 [phone]

## 2020-11-29 NOTE — Discharge Instructions (Addendum)
Blood work pending Referral to gastroneterology placed- they should call you Set up a primary care- contacts below- please call them Begin omeprazole twice daily Zofran as needed for nausea Be sure to drink plenty of fluids Please go to emergency room if developing persistent or worsening pain, worsening fatigue lightheadedness, dizziness

## 2020-11-29 NOTE — ED Triage Notes (Addendum)
Pt said he had some test done a while back and showed his liver enzymes are elevated bc he is a heavy drinker and the past few day has been noticing RUQ pain with n/v and severe cramping.

## 2020-11-30 LAB — CBC WITH DIFFERENTIAL/PLATELET
Basophils Absolute: 0.1 10*3/uL (ref 0.0–0.2)
Basos: 1 %
EOS (ABSOLUTE): 0.2 10*3/uL (ref 0.0–0.4)
Eos: 4 %
Hematocrit: 44.5 % (ref 37.5–51.0)
Hemoglobin: 15.8 g/dL (ref 13.0–17.7)
Immature Grans (Abs): 0 10*3/uL (ref 0.0–0.1)
Immature Granulocytes: 1 %
Lymphocytes Absolute: 1.5 10*3/uL (ref 0.7–3.1)
Lymphs: 24 %
MCH: 33.7 pg — ABNORMAL HIGH (ref 26.6–33.0)
MCHC: 35.5 g/dL (ref 31.5–35.7)
MCV: 95 fL (ref 79–97)
Monocytes Absolute: 0.8 10*3/uL (ref 0.1–0.9)
Monocytes: 12 %
Neutrophils Absolute: 3.5 10*3/uL (ref 1.4–7.0)
Neutrophils: 58 %
Platelets: 213 10*3/uL (ref 150–450)
RBC: 4.69 x10E6/uL (ref 4.14–5.80)
RDW: 12 % (ref 11.6–15.4)
WBC: 6.1 10*3/uL (ref 3.4–10.8)

## 2020-11-30 LAB — COMPREHENSIVE METABOLIC PANEL
ALT: 136 IU/L — ABNORMAL HIGH (ref 0–44)
AST: 122 IU/L — ABNORMAL HIGH (ref 0–40)
Albumin/Globulin Ratio: 2.2 (ref 1.2–2.2)
Albumin: 5.2 g/dL (ref 4.1–5.2)
Alkaline Phosphatase: 77 IU/L (ref 44–121)
BUN/Creatinine Ratio: 7 — ABNORMAL LOW (ref 9–20)
BUN: 5 mg/dL — ABNORMAL LOW (ref 6–20)
Bilirubin Total: 0.6 mg/dL (ref 0.0–1.2)
CO2: 23 mmol/L (ref 20–29)
Calcium: 9.6 mg/dL (ref 8.7–10.2)
Chloride: 94 mmol/L — ABNORMAL LOW (ref 96–106)
Creatinine, Ser: 0.74 mg/dL — ABNORMAL LOW (ref 0.76–1.27)
GFR calc Af Amer: 143 mL/min/{1.73_m2} (ref >59)
GFR calc non Af Amer: 124 mL/min/{1.73_m2} (ref >59)
Globulin, Total: 2.4 g/dL (ref 1.5–4.5)
Glucose: 78 mg/dL (ref 65–99)
Potassium: 3.9 mmol/L (ref 3.5–5.2)
Sodium: 134 mmol/L (ref 134–144)
Total Protein: 7.6 g/dL (ref 6.0–8.5)

## 2020-11-30 LAB — LIPASE: Lipase: 24 U/L (ref 13–78)

## 2021-04-05 IMAGING — US ULTRASOUND OF SCROTUM
1 series · 13 of 25 positions shown · non-contrast
Comparison: 08/13/2017

CLINICAL DATA: Palpable lump on the left

EXAM:
SCROTAL ULTRASOUND
DOPPLER ULTRASOUND OF THE TESTICLES
TECHNIQUE: Complete ultrasound examination of the testicles, epididymis, and
other scrotal structures was performed. Color and spectral Doppler
ultrasound were also utilized to evaluate blood flow to the
testicles.

[Series 1: ultrasound of scrotum · 13 of 36 slices shown]
[im 1/36]
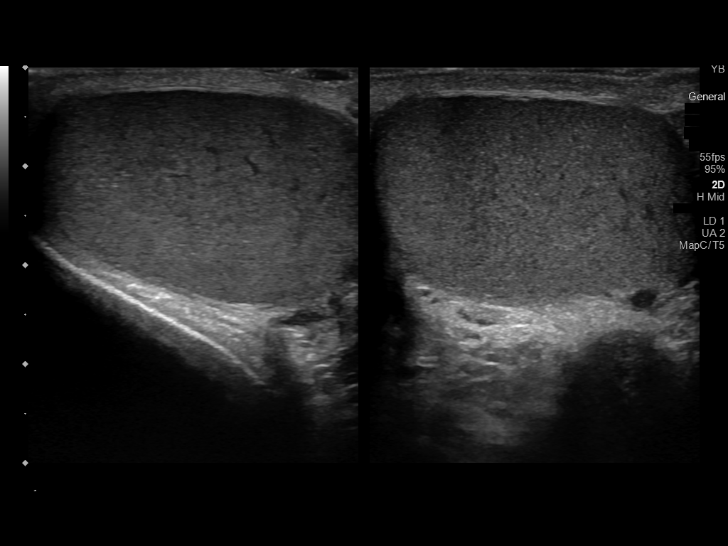
[im 3/36]
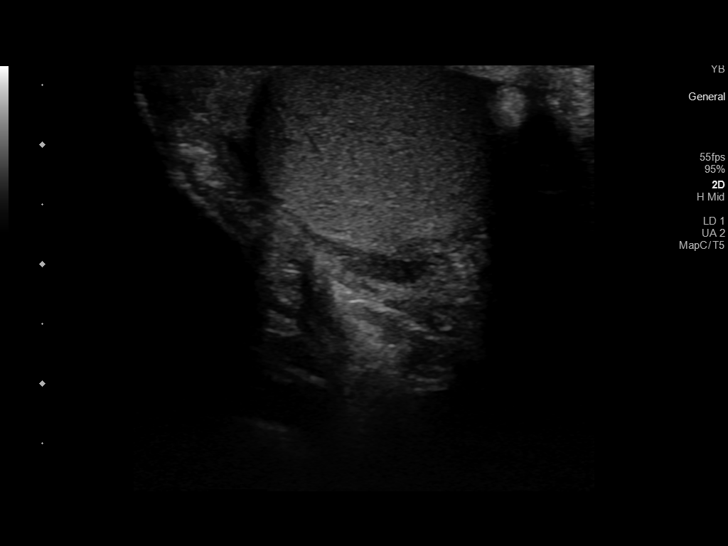
[im 6/36]
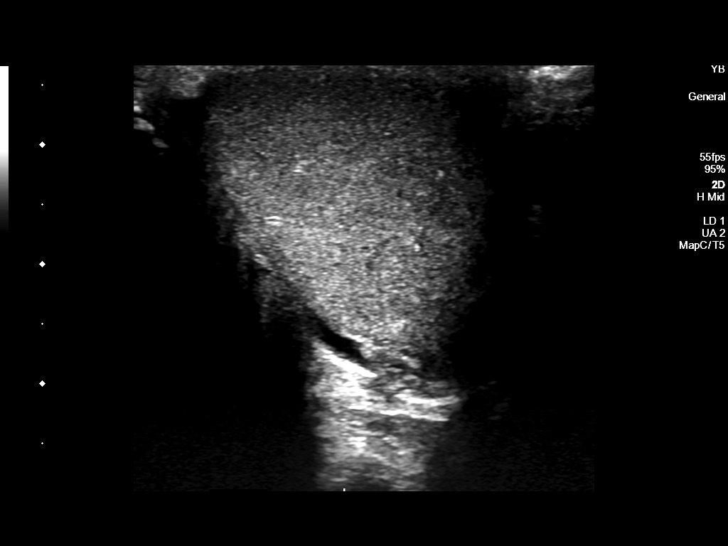
[im 9/36]
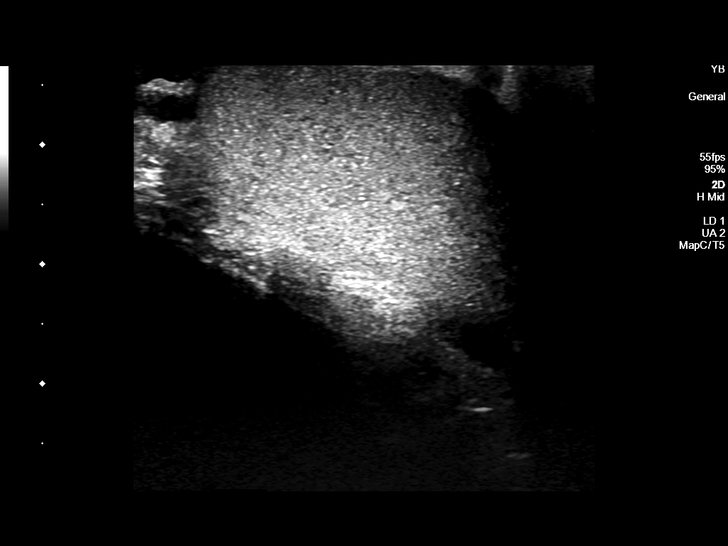
[im 12/36]
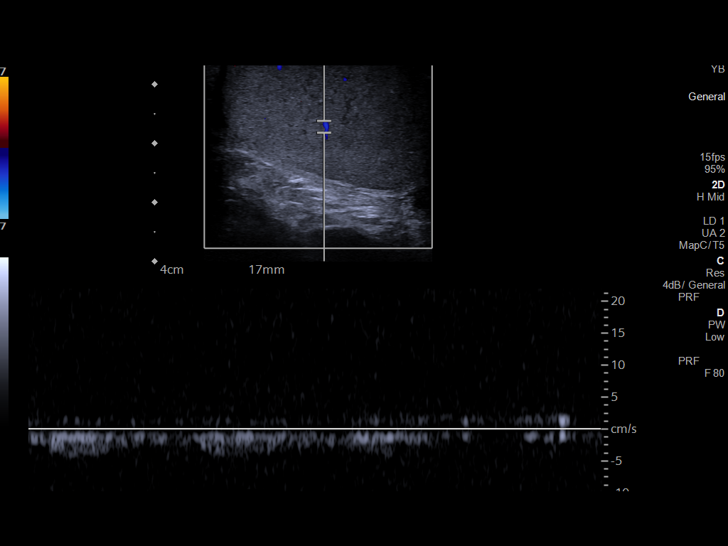
[im 15/36]
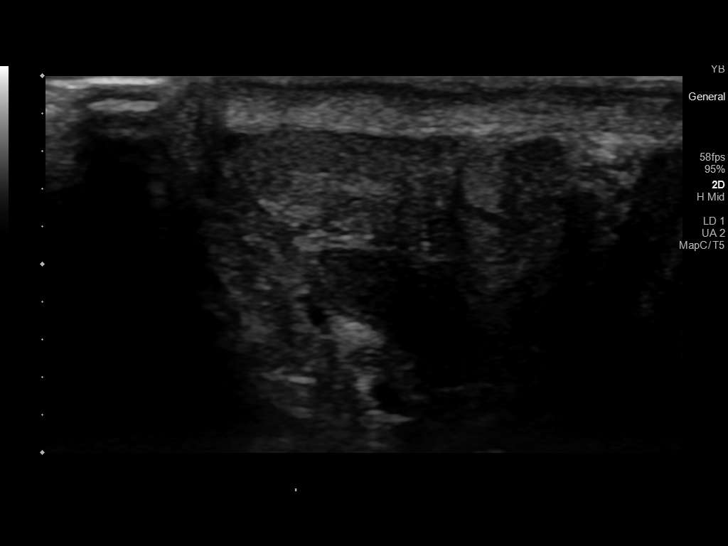
[im 18/36]
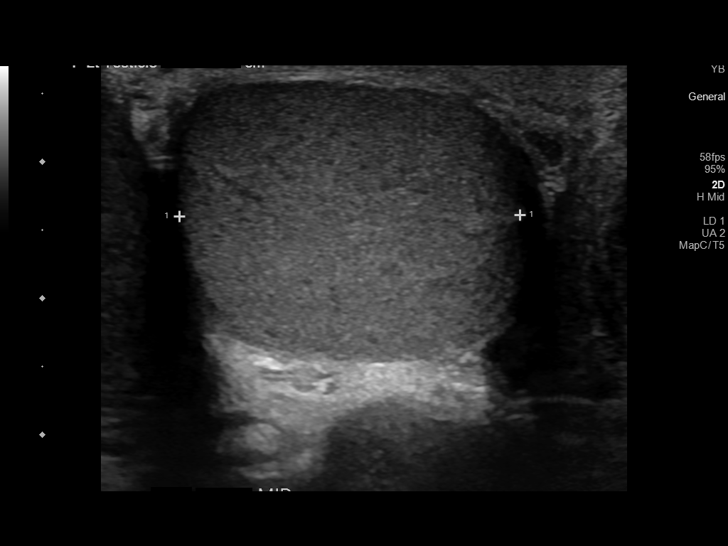
[im 21/36]
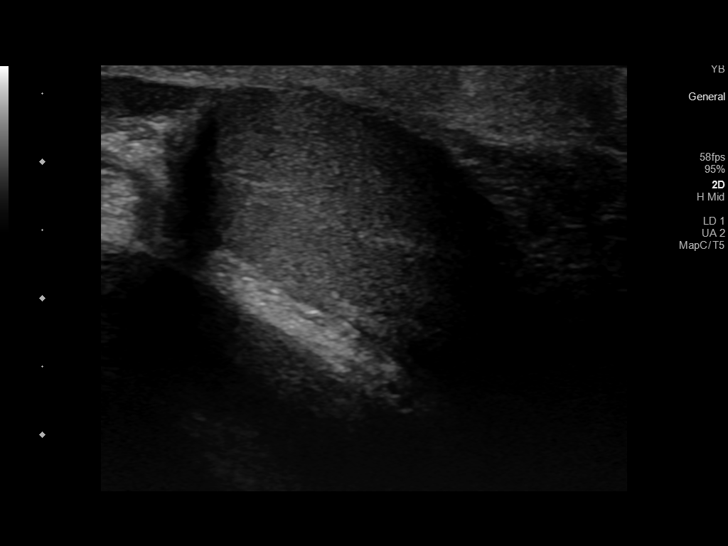
[im 24/36]
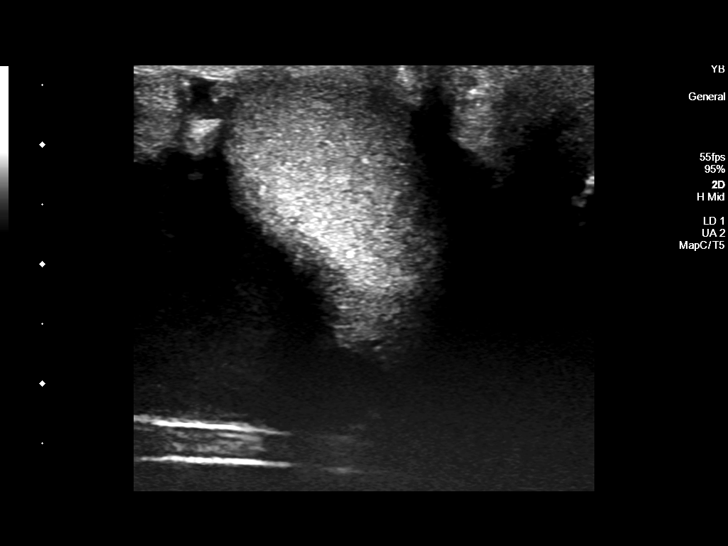
[im 27/36]
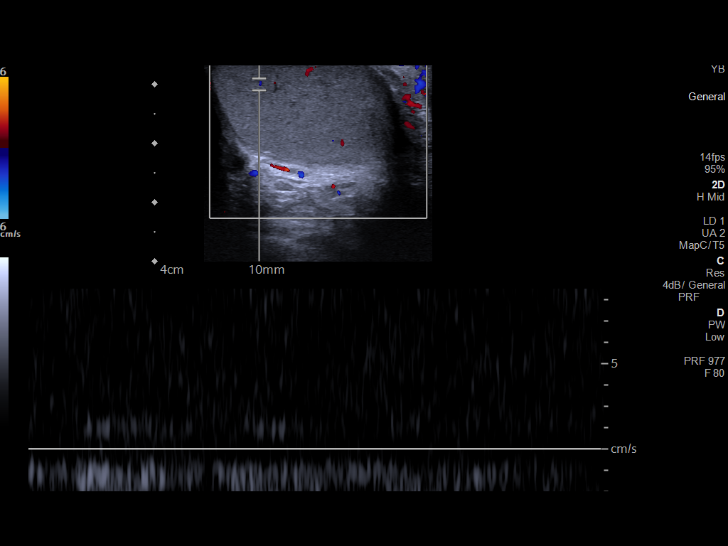
[im 30/36]
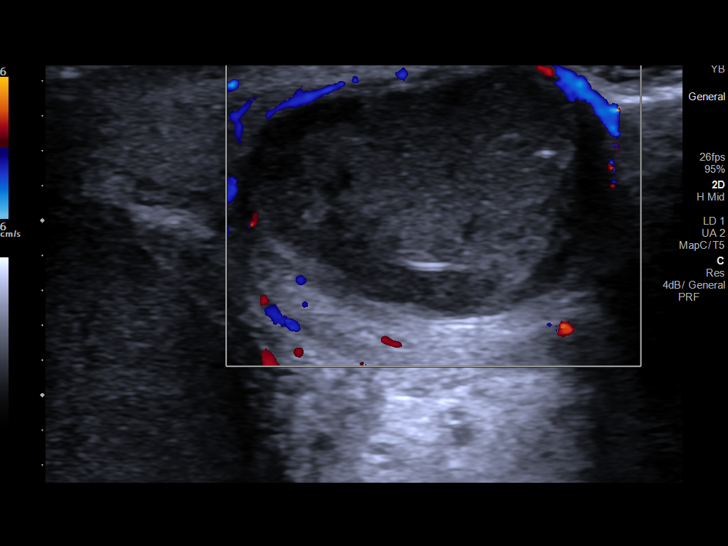
[im 33/36]
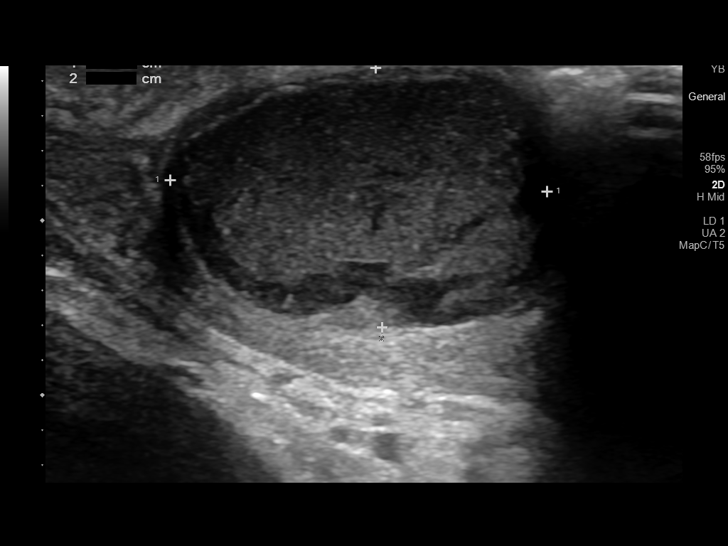
[im 36/36]
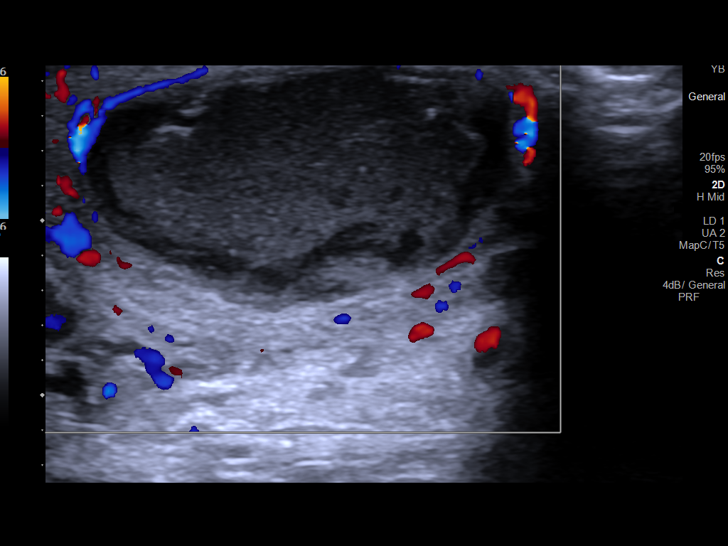

[13 of 25 positions shown; findings below may reference images not displayed]

FINDINGS: Right testicle

Measurements: 4.0 x 2.3 x 3.2 cm. No mass or microlithiasis
visualized.

Left testicle

Measurements: 3.9 x 2.3 x 2.5 cm. No mass or microlithiasis
visualized.

Right epididymis:  Normal in size and appearance.

Left epididymis: There is a 2.3 x 2.2 cm lesion arising from the
epididymis which demonstrates a similar echo pattern to that of the
normal adjacent epididymis. This may represent changes of chronic
epididymitis although the possibility of a scrotal mass although
typically benign deserves consideration.

Hydrocele:  None visualized.

Varicocele:  None visualized.

Pulsed Doppler interrogation of both testes demonstrates normal low
resistance arterial and venous waveforms bilaterally.
IMPRESSION: Relatively isoechoic area adjacent to the epididymis on the left.
These changes may be related to chronic inflammatory change although
the possibility of an epididymal mass (likely benign) could not be
totally excluded given the increase in size.

Normal-appearing testicles.

## 2022-05-18 ENCOUNTER — Ambulatory Visit
Admission: EM | Admit: 2022-05-18 | Discharge: 2022-05-18 | Disposition: A | Discharge status: Home / Self Care | Payer: Medicaid Other | Attending: Physician Assistant | Admitting: Physician Assistant

## 2022-05-18 DIAGNOSIS — F1910 Other psychoactive substance abuse, uncomplicated: Secondary | ICD-10-CM

## 2022-05-18 DIAGNOSIS — Z87898 Personal history of other specified conditions: Secondary | ICD-10-CM

## 2022-05-18 DIAGNOSIS — R1011 Right upper quadrant pain: Secondary | ICD-10-CM

## 2022-05-18 DIAGNOSIS — Z8659 Personal history of other mental and behavioral disorders: Secondary | ICD-10-CM

## 2022-05-18 DIAGNOSIS — F1010 Alcohol abuse, uncomplicated: Secondary | ICD-10-CM

## 2022-05-18 MED ORDER — SUCRALFATE 1 G PO TABS: 1.0000 g | ORAL_TABLET | Freq: Three times a day (TID) | ORAL | 0 refills | Status: DC

## 2022-05-18 NOTE — ED Triage Notes (Signed)
Patient presents to Urgent Care with complaints of abdominal pain, nausea, back pain since 2 days ago. Patient reports he abuses etoh and is concerned that his liver enzymes are messed up. Pt reports this is happened before. Pt reports drinking about 12 cans of  beer a day and some liquor. Pt reports inability to eat. Last drink was today

## 2022-05-18 NOTE — Discharge Instructions (Signed)
I am concerned about your liver function with the alcohol you have been consuming.  As we discussed, you should not stop alcohol suddenly particularly given you have had a history of seizures.  Please slowly decrease the amount of alcohol you are consuming and I recommend that you follow-up with the behavioral health urgent care in order to be contacted to resources.  I will contact you with your lab work if anything is abnormal to change treatment plan.  I do believe that part of your abdominal pain is related to irritation of the stomach from alcohol.  Start Carafate as well as over-the-counter medications such as Prilosec.  Make sure you are drinking plenty of fluid.  If you have any severe abdominal pain, nausea/vomiting interfering with oral intake, blood in your vomit, blood in your stool, weakness, seizure activity you need to go to the emergency room immediately.

## 2022-05-18 NOTE — ED Provider Notes (Signed)
[facility]    CSN: [id number] Arrival date & time: 05/18/22  1325      History   Chief Complaint Chief Complaint  Patient presents with   liver issues     HPI Carlos French is a 31 y.o. male.   Patient presents today with a year plus long history of right upper quadrant abdominal pain.  He has a history of alcohol abuse and has been seen by our clinic December 2021 at which point liver enzymes were elevated in the 100-200.  He continues to drink alcohol and reports drinking between 12 and 18 beers per day with last consumption earlier today.  He has been to detox in the past and reports alcohol withdrawal seizure many years ago.  He is not currently seeing a primary care provider or behavioral health specialist.  He reports smoking crack as well as marijuana but does not use any IV drugs.  He reports significant life stressors as he has had several family members die which has exacerbated his drinking.  He reports pain is rated 4 on a 0-10 pain scale, localized to right upper quadrant, described as aching, no aggravating or alleviating factors identified.  He does report nausea and vomiting particularly in the morning but is able to eat and drink despite symptoms.  He is not taking any over-the-counter medication for symptom management.  He denies any jaundice, confusion, nausea/vomiting interfering with oral intake, hypotension.  Denies thoughts of suicide/self-harm.   Past Medical History:  Diagnosis Date   Allergy    Anxiety    Bipolar 1 disorder (HCC)    Depression    GERD (gastroesophageal reflux disease)    GI (gastrointestinal bleed)    GI bleeding    PTSD (post-traumatic stress disorder)    Suicidal ideation     Patient Active Problem List   Diagnosis Date Noted   Alcohol abuse with alcohol-induced disorder (HCC)    Moderate cannabis use disorder (HCC) 09/30/2018   PTSD (post-traumatic stress disorder) 09/16/2018   MDD (major depressive disorder),  recurrent severe, without psychosis (HCC) 11/15/2017   MDD (major depressive disorder), recurrent, severe, with psychosis (HCC) 11/03/2017   Depression    Borderline personality disorder (HCC) 02/10/2016   Substance induced mood disorder (HCC) 02/09/2016   Alcohol withdrawal (HCC) 08/23/2015   Tobacco use disorder 08/23/2015   Cocaine use disorder, severe, dependence (HCC) 08/11/2015   Alcohol use disorder, severe, dependence (HCC) 08/11/2015   Cannabis use disorder, severe, dependence (HCC) 08/11/2015    Past Surgical History:  Procedure Laterality Date   NO PAST SURGERIES         Home Medications    Prior to Admission medications   Medication Sig Start Date End Date Taking? Authorizing Provider  sucralfate (CARAFATE) 1 g tablet Take 1 tablet (1 g total) by mouth 4 (four) times daily -  with meals and at bedtime. 05/18/22  Yes Cheri Ayotte K, PA-C  hydrOXYzine (ATARAX/VISTARIL) 25 MG tablet Take 1 tablet (25 mg total) by mouth every 6 (six) hours as needed. For anxiety Patient not taking: Reported on 05/18/2022 04/15/20   Armandina StammerNwoko, Agnes I, NP  mirtazapine (REMERON) 15 MG tablet Take 1 tablet (15 mg total) by mouth at bedtime. For depression/sleep Patient not taking: Reported on 05/18/2022 04/15/20   Armandina StammerNwoko, Agnes I, NP  Multiple Vitamin (MULTIVITAMIN WITH MINERALS) TABS tablet Take 1 tablet by mouth daily. Patient not taking: Reported on 05/18/2022    [provider]  nicotine polacrilex (NICORETTE)  2 MG gum Take 1 each (2 mg total) by mouth as needed. (May buy from over the counter): For smoking cessation Patient not taking: Reported on 05/18/2022 04/15/20   Armandina Stammer I, NP  omeprazole (PRILOSEC) 20 MG capsule Take 1 capsule (20 mg total) by mouth 2 (two) times daily before a meal. Patient not taking: Reported on 05/18/2022 11/29/20   Wieters, Hallie C, PA-C  ondansetron (ZOFRAN ODT) 4 MG disintegrating tablet Take 1 tablet (4 mg total) by mouth every 8 (eight) hours as needed for  nausea or vomiting. Patient not taking: Reported on 05/18/2022 11/29/20   Wieters, Fran [place]es C, PA-C  traZODone (DESYREL) 50 MG tablet Take 1 tablet (50 mg total) by mouth at bedtime as needed for sleep. Patient not taking: Reported on 05/18/2022 04/15/20   Sanjuana Kava, NP    Family History Family History  Problem Relation Age of Onset   Stroke Mother     Social History Social History   Tobacco Use   Smoking status: Every Day    Packs/day: 1.00    Types: Cigarettes   Smokeless tobacco: Former  Building services engineer Use: Never used  Substance Use Topics   Alcohol use: Yes    Alcohol/week: 84.0 standard drinks    Types: 84 Cans of beer per week   Drug use: Yes    Types: Marijuana, Cocaine    Comment: marijuana daily and crack cocain a few times a week     Allergies   Acetaminophen and Ibuprofen   Review of Systems Review of Systems  Constitutional:  Positive for activity change. Negative for appetite change, fatigue and fever.  Respiratory:  Negative for cough and shortness of breath.   Cardiovascular:  Negative for chest pain.  Gastrointestinal:  Positive for abdominal pain, nausea and vomiting. Negative for blood in stool, constipation and diarrhea.  Neurological:  Negative for dizziness, light-headedness and headaches.    Physical Exam Triage Vital Signs ED Triage Vitals  Enc Vitals Group     BP 05/18/22 1454 (!) 144/99     Pulse Rate 05/18/22 1454 74     Resp 05/18/22 1454 18     Temp 05/18/22 1454 (!) 97.3 F (36.3 C)     Temp Source 05/18/22 1454 Oral     SpO2 05/18/22 1454 98 %     Weight --      Height --      Head Circumference --      Peak Flow --      Pain Score 05/18/22 1451 5     Pain Loc --      Pain Edu? --      Excl. in GC? --    No data found.  Updated Vital Signs BP (!) 144/99 (BP Location: Right Arm)   Pulse 74   Temp (!) 97.3 F (36.3 C) (Oral)   Resp 18   SpO2 98%   Visual Acuity Right Eye Distance:   Left Eye Distance:    Bilateral Distance:    Right Eye Near:   Left Eye Near:    Bilateral Near:     Physical Exam Vitals reviewed.  Constitutional:      General: He is awake.     Appearance: Normal appearance. He is well-developed. He is not ill-appearing.     Comments: Very pleasant male appears older than stated age in no acute distress sitting comfortably in exam room  HENT:     Head: Normocephalic and atraumatic.  Mouth/Throat:     Pharynx: No oropharyngeal exudate, posterior oropharyngeal erythema or uvula swelling.  Eyes:     General: No scleral icterus. Cardiovascular:     Rate and Rhythm: Normal rate and regular rhythm.     Heart sounds: Normal heart sounds, S1 normal and S2 normal. No murmur heard. Pulmonary:     Effort: Pulmonary effort is normal.     Breath sounds: Normal breath sounds. No stridor. No wheezing, rhonchi or rales.     Comments: Clear to auscultation bilaterally Abdominal:     General: Bowel sounds are normal.     Palpations: Abdomen is soft.     Tenderness: There is abdominal tenderness in the right upper quadrant and epigastric area. There is no right CVA tenderness, left CVA tenderness, guarding or rebound.     Comments: Tenderness palpation right upper quadrant and epigastrium.  No evidence of acute abdomen on physical exam.  Skin:    Coloration: Skin is not jaundiced.  Neurological:     Mental Status: He is alert.  Psychiatric:        Behavior: Behavior is cooperative.     UC Treatments / Results  Labs (all labs ordered are listed, but only abnormal results are displayed) Labs Reviewed  CBC WITH DIFFERENTIAL/PLATELET  COMPREHENSIVE METABOLIC PANEL  LIPASE  GAMMA GT  HEPATITIS PANEL, ACUTE  PROTIME-INR    EKG   Radiology No results found.  Procedures Procedures (including critical care time)  Medications Ordered in UC Medications - No data to display  Initial Impression / Assessment and Plan / UC Course  I have reviewed the triage vital  signs and the nursing notes.  Pertinent labs & imaging results that were available during my care of the patient were reviewed by me and considered in my medical decision making (see chart for details).     Discussed that alcohol abuse is likely contributing to symptoms and I am concerned about his liver functioning.  No evidence of acute liver failure on exam and patient is well-appearing, afebrile, nontoxic.  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  We will start Carafate 4 times daily and instructed to use over-the-counter PPI for additional symptom relief.  He was given contact information for behavioral health clinic with instruction to go there for evaluation and to be connected to resources.  Labs including CBC, CMP, lipase, GGT, PT/INR, hepatitis panel were obtained today-results pending.  We will make additional recommendations based on laboratory findings.  Patient does not currently have a primary care's we will try to establish with 1 via PCP assistance.  Discussed that if he develops any worsening symptoms including abdominal pain, fever, nausea, vomiting, melena, hematochezia, hematemesis he is to go to the emergency room to which she expressed understanding.  Work excuse note provided.  Final Clinical Impressions(s) / UC Diagnoses   Final diagnoses:  RUQ abdominal pain  Alcohol abuse  History of seizure due to alcohol withdrawal  Polysubstance abuse [facility])     Discharge Instructions      I am concerned about your liver function with the alcohol you have been consuming.  As we discussed, you should not stop alcohol suddenly particularly given you have had a history of seizures.  Please slowly decrease the amount of alcohol you are consuming and I recommend that you follow-up with the behavioral health urgent care in  order to  be contacted to resources.  I will contact you with your lab work if anything is abnormal to change treatment plan.  I do believe that part of your abdominal pain is related to irritation of the stomach from alcohol.  Start Carafate as well as over-the-counter medications such as Prilosec.  Make sure you are drinking plenty of fluid.  If you have any severe abdominal pain, nausea/vomiting interfering with oral intake, blood in your vomit, blood in your stool, weakness, seizure activity you need to go to the emergency room immediately.     ED Prescriptions     Medication Sig Dispense Auth. Provider   sucralfate (CARAFATE) 1 g tablet Take 1 tablet (1 g total) by mouth 4 (four) times daily -  with meals and at bedtime. 28 tablet Deckard Stuber, Noberto Retort, PA-C      PDMP not reviewed this encounter.   Jeani Hawking, PA-C 05/18/22 1518

## 2022-05-21 LAB — CBC WITH DIFFERENTIAL/PLATELET
Basophils Absolute: 0.1 10*3/uL (ref 0.0–0.2)
Basos: 1 %
EOS (ABSOLUTE): 0.3 10*3/uL (ref 0.0–0.4)
Eos: 5 %
Hematocrit: 47 % (ref 37.5–51.0)
Hemoglobin: 16.3 g/dL (ref 13.0–17.7)
Immature Grans (Abs): 0 10*3/uL (ref 0.0–0.1)
Immature Granulocytes: 1 %
Lymphocytes Absolute: 1.5 10*3/uL (ref 0.7–3.1)
Lymphs: 24 %
MCH: 33.1 pg — ABNORMAL HIGH (ref 26.6–33.0)
MCHC: 34.7 g/dL (ref 31.5–35.7)
MCV: 95 fL (ref 79–97)
Monocytes Absolute: 0.8 10*3/uL (ref 0.1–0.9)
Monocytes: 12 %
Neutrophils Absolute: 3.8 10*3/uL (ref 1.4–7.0)
Neutrophils: 57 %
Platelets: 265 10*3/uL (ref 150–450)
RBC: 4.93 x10E6/uL (ref 4.14–5.80)
RDW: 12.4 % (ref 11.6–15.4)
WBC: 6.5 10*3/uL (ref 3.4–10.8)

## 2022-05-21 LAB — COMPREHENSIVE METABOLIC PANEL
ALT: 55 IU/L — ABNORMAL HIGH (ref 0–44)
AST: 53 IU/L — ABNORMAL HIGH (ref 0–40)
Albumin/Globulin Ratio: 1.9 (ref 1.2–2.2)
Albumin: 5.1 g/dL — ABNORMAL HIGH (ref 4.0–5.0)
Alkaline Phosphatase: 77 IU/L (ref 44–121)
BUN/Creatinine Ratio: 7 — ABNORMAL LOW (ref 9–20)
BUN: 5 mg/dL — ABNORMAL LOW (ref 6–20)
Bilirubin Total: 0.4 mg/dL (ref 0.0–1.2)
CO2: 19 mmol/L — ABNORMAL LOW (ref 20–29)
Calcium: 9.6 mg/dL (ref 8.7–10.2)
Chloride: 94 mmol/L — ABNORMAL LOW (ref 96–106)
Creatinine, Ser: 0.69 mg/dL — ABNORMAL LOW (ref 0.76–1.27)
Globulin, Total: 2.7 g/dL (ref 1.5–4.5)
Glucose: 74 mg/dL (ref 70–99)
Potassium: 3.7 mmol/L (ref 3.5–5.2)
Sodium: 131 mmol/L — ABNORMAL LOW (ref 134–144)
Total Protein: 7.8 g/dL (ref 6.0–8.5)
eGFR: 127 mL/min/{1.73_m2} (ref >59)

## 2022-05-21 LAB — PROTIME-INR
INR: 1 (ref 0.9–1.2)
Prothrombin Time: 10.3 s (ref 9.1–12.0)

## 2022-05-21 LAB — LIPASE: Lipase: 22 U/L (ref 13–78)

## 2022-05-21 LAB — GAMMA GT: GGT: 38 IU/L (ref 0–65)

## 2022-05-23 LAB — SPECIMEN STATUS REPORT

## 2022-05-23 LAB — ACUTE VIRAL HEPATITIS (HAV, HBV, HCV)
HCV Ab: NONREACTIVE
Hep A IgM: NEGATIVE
Hep B C IgM: NEGATIVE
Hepatitis B Surface Ag: NEGATIVE

## 2022-05-23 LAB — HCV INTERPRETATION

## 2023-11-12 ENCOUNTER — Other Ambulatory Visit: Payer: Self-pay

## 2023-11-12 ENCOUNTER — Emergency Department (HOSPITAL_COMMUNITY)
Admission: EM | Admit: 2023-11-12 | Discharge: 2023-11-12 | Disposition: A | Discharge status: Home / Self Care | Payer: MEDICAID | Attending: Emergency Medicine | Admitting: Emergency Medicine

## 2023-11-12 ENCOUNTER — Encounter (HOSPITAL_COMMUNITY): Payer: Self-pay

## 2023-11-12 DIAGNOSIS — W540XXA Bitten by dog, initial encounter: Secondary | ICD-10-CM | POA: Diagnosis not present

## 2023-11-12 DIAGNOSIS — S81851A Open bite, right lower leg, initial encounter: Secondary | ICD-10-CM | POA: Diagnosis present

## 2023-11-12 MED ORDER — AMOXICILLIN-POT CLAVULANATE 875-125 MG PO TABS
1.0000 | ORAL_TABLET | Freq: Once | ORAL | Status: AC
  Administered 2023-11-12: 1 via ORAL
  Filled 2023-11-12: qty 1

## 2023-11-12 MED ORDER — ONDANSETRON 8 MG PO TBDP
8.0000 mg | ORAL_TABLET | Freq: Once | ORAL | Status: AC
Start: 2023-11-12 — End: 2023-11-12
  Administered 2023-11-12: 8 mg via ORAL
  Filled 2023-11-12: qty 1

## 2023-11-12 MED ORDER — AMOXICILLIN-POT CLAVULANATE 875-125 MG PO TABS: 1.0000 | ORAL_TABLET | Freq: Two times a day (BID) | ORAL | 0 refills | Status: DC

## 2023-11-12 NOTE — ED Triage Notes (Addendum)
Pt homeless BIBA with a dog bite from a family dog to the rt upper thigh that occurred early over in the am on 11/17@0200 . The site is swollen and purple/pink-ishin color,no drainage noted, and warm to the touch.

## 2023-11-12 NOTE — ED Provider Notes (Signed)
[place] EMERGENCY DEPARTMENT AT [facility] Provider Note   CSN: [id number] Arrival date & time: 11/12/23  2952     History  Chief Complaint  Patient presents with   Animal Bite    Carlos French is a 32 y.o. male.  The history is provided by the patient.  Animal Bite Contact animal:  Dog Location:  Leg Leg injury location:  R upper leg Time since incident:  1 day Pain details:    Quality:  Aching   Severity:  Mild   Timing:  Constant   Progression:  Unchanged Incident location:  Home Notifications:  None Animal's rabies vaccination status:  Up to date Animal in possession: yes   Tetanus status:  Up to date Relieved by:  Nothing Worsened by:  Nothing Ineffective treatments:  None tried Associated symptoms: no fever   Bit yesterday by uncles dog.       Home Medications Prior to Admission medications   Medication Sig Start Date End Date Taking? Authorizing Provider  amoxicillin-clavulanate (AUGMENTIN) 875-125 MG tablet Take 1 tablet by mouth every 12 (twelve) hours. 11/12/23  Yes Tilley Faeth, MD  hydrOXYzine (ATARAX/VISTARIL) 25 MG tablet Take 1 tablet (25 mg total) by mouth every 6 (six) hours as needed. For anxiety Patient not taking: Reported on 05/18/2022 04/15/20   Armandina Stammer I, NP  mirtazapine (REMERON) 15 MG tablet Take 1 tablet (15 mg total) by mouth at bedtime. For depression/sleep Patient not taking: Reported on 05/18/2022 04/15/20   Armandina Stammer I, NP  Multiple Vitamin (MULTIVITAMIN WITH MINERALS) TABS tablet Take 1 tablet by mouth daily. Patient not taking: Reported on 05/18/2022    [provider]  nicotine polacrilex (NICORETTE) 2 MG gum Take 1 each (2 mg total) by mouth as needed. (May buy from over the counter): For smoking cessation Patient not taking: Reported on 05/18/2022 04/15/20   Armandina Stammer I, NP  omeprazole (PRILOSEC) 20 MG capsule Take 1 capsule (20 mg total) by mouth 2 (two) times daily before a meal. Patient not  taking: Reported on 05/18/2022 11/29/20   Wieters, Hallie C, PA-C  ondansetron (ZOFRAN ODT) 4 MG disintegrating tablet Take 1 tablet (4 mg total) by mouth every 8 (eight) hours as needed for nausea or vomiting. Patient not taking: Reported on 05/18/2022 11/29/20   Wieters, Hallie C, PA-C  sucralfate (CARAFATE) 1 g tablet Take 1 tablet (1 g total) by mouth 4 (four) times daily -  with meals and at bedtime. 05/18/22   Raspet, Noberto Retort, PA-C  traZODone (DESYREL) 50 MG tablet Take 1 tablet (50 mg total) by mouth at bedtime as needed for sleep. Patient not taking: Reported on 05/18/2022 04/15/20   Armandina Stammer I, NP      Allergies    Acetaminophen and Ibuprofen    Review of Systems   Review of Systems  Constitutional:  Negative for fever.  HENT:  Negative for facial swelling.   Eyes:  Negative for redness.  Respiratory:  Negative for wheezing and stridor.   All other systems reviewed and are negative.   Physical Exam Updated Vital Signs BP (!) 143/94 (BP Location: Left Arm)   Pulse 82   Temp 97.8 F (36.6 C) (Oral)   Resp 18   SpO2 99%  Physical Exam Vitals and nursing note reviewed. Exam conducted with a chaperone present.  Constitutional:      General: He is not in acute distress.    Appearance: Normal appearance. He is well-developed. He is  not diaphoretic.  HENT:     Head: Normocephalic and atraumatic.     Nose: Nose normal.  Eyes:     Conjunctiva/sclera: Conjunctivae normal.     Pupils: Pupils are equal, round, and reactive to light.  Cardiovascular:     Rate and Rhythm: Normal rate and regular rhythm.     Pulses: Normal pulses.     Heart sounds: Normal heart sounds.  Pulmonary:     Effort: Pulmonary effort is normal.     Breath sounds: Normal breath sounds. No wheezing or rales.  Abdominal:     General: Bowel sounds are normal.     Palpations: Abdomen is soft.     Tenderness: There is no abdominal tenderness. There is no guarding or rebound.  Musculoskeletal:         General: Normal range of motion.     Cervical back: Normal range of motion and neck supple.       Legs:  Skin:    General: Skin is warm and dry.  Neurological:     Mental Status: He is alert and oriented to person, place, and time.     ED Results / Procedures / Treatments   Labs (all labs ordered are listed, but only abnormal results are displayed) Labs Reviewed - No data to display  EKG None  Radiology No results found.  Procedures Procedures    Medications Ordered in ED Medications  amoxicillin-clavulanate (AUGMENTIN) 875-125 MG per tablet 1 tablet (has no administration in time range)    ED Course/ Medical Decision Making/ A&P                                 Medical Decision Making Bit by uncles dog yesterday   Amount and/or Complexity of Data Reviewed External Data Reviewed: notes.    Details: Previous notes reviewed   Risk Prescription drug management. Risk Details: Tetanus is up to date, dog is in possession.  Wound care provided.  Augmentin initiated.  Will have patient follow up with orthopedics for ongoing care.  Stable for discharge.      Final Clinical Impression(s) / ED Diagnoses Final diagnoses:  Dog bite, initial encounter   Return for intractable cough, coughing up blood, fevers > 100.4 unrelieved by medication, shortness of breath, intractable vomiting, chest pain, shortness of breath, weakness, numbness, changes in speech, facial asymmetry, abdominal pain, passing out, Inability to tolerate liquids or food, cough, altered mental status or any concerns. No signs of systemic illness or infection. The patient is nontoxic-appearing on exam and vital signs are within normal limits.  I have reviewed the triage vital signs and the nursing notes. Pertinent labs & imaging results that were available during my care of the patient were reviewed by me and considered in my medical decision making (see chart for details). After history, exam, and medical workup  I feel the patient has been appropriately medically screened and is safe for discharge home. Pertinent diagnoses were discussed with the patient. Patient was given return precautions.  Rx / DC Orders ED Discharge Orders          Ordered    amoxicillin-clavulanate (AUGMENTIN) 875-125 MG tablet  Every 12 hours        11/12/23 0105              Karsyn Jamie, MD 11/12/23 0110

## 2024-01-21 ENCOUNTER — Emergency Department (HOSPITAL_COMMUNITY)
Admission: EM | Admit: 2024-01-21 | Discharge: 2024-01-21 | Discharge status: Against Medical Advice | Payer: MEDICAID | Attending: Emergency Medicine | Admitting: Emergency Medicine

## 2024-01-21 DIAGNOSIS — Z5321 Procedure and treatment not carried out due to patient leaving prior to being seen by health care provider: Secondary | ICD-10-CM | POA: Diagnosis not present

## 2024-01-21 DIAGNOSIS — R112 Nausea with vomiting, unspecified: Secondary | ICD-10-CM | POA: Diagnosis not present

## 2024-01-21 DIAGNOSIS — R300 Dysuria: Secondary | ICD-10-CM | POA: Insufficient documentation

## 2024-01-21 DIAGNOSIS — K5900 Constipation, unspecified: Secondary | ICD-10-CM | POA: Diagnosis not present

## 2024-01-21 DIAGNOSIS — R1030 Lower abdominal pain, unspecified: Secondary | ICD-10-CM | POA: Diagnosis present

## 2024-01-21 MED ORDER — ONDANSETRON 4 MG PO TBDP: 4.0000 mg | ORAL_TABLET | Freq: Once | ORAL | Status: DC

## 2024-01-21 NOTE — ED Provider Triage Note (Signed)
Emergency Medicine Provider Triage Evaluation Note  Carlos French , a 33 y.o. male  was evaluated in triage.  Pt complains of lower abdominal pain that is been for the last 2 days.  Some dysuria as well as feeling constipated.  Some nausea and vomiting this morning.  No fevers.  No prior abdominal surgeries.  He denies any penile discharge  Review of Systems  Positive: Abdominal pain, nausea vomiting, constipation and dysuria Negative: Fever, cough, shortness of breath  Physical Exam  BP 125/83 (BP Location: Left Arm)   Pulse 91   Temp 98.7 F (37.1 C) (Oral)   Resp 14   SpO2 99%  Gen:   Awake, no distress   Resp:  Normal effort  MSK:   Moves extremities without difficulty  Other:  Abdominal pain with lower pain present with palpation in the right lower quadrant, suprapubic and left lower quadrant  Medical Decision Making  Medically screening exam initiated at 12:46 PM.  Appropriate orders placed.  Chriss Czar was informed that the remainder of the evaluation will be completed by another provider, this initial triage assessment does not replace that evaluation, and the importance of remaining in the ED until their evaluation is complete.     Gwyneth Sprout, MD 01/21/24 1248

## 2024-01-21 NOTE — ED Notes (Signed)
Prior to triage patient states he is leaving and going to urgent care.

## 2024-09-07 ENCOUNTER — Other Ambulatory Visit: Payer: Self-pay

## 2024-09-07 ENCOUNTER — Emergency Department (HOSPITAL_COMMUNITY)
Admission: EM | Admit: 2024-09-07 | Discharge: 2024-09-07 | Disposition: A | Discharge status: Home / Self Care | Payer: MEDICAID | Attending: Emergency Medicine | Admitting: Emergency Medicine

## 2024-09-07 ENCOUNTER — Other Ambulatory Visit (HOSPITAL_COMMUNITY)
Admission: EM | Admit: 2024-09-07 | Discharge: 2024-09-11 | Disposition: A | Discharge status: Home / Self Care | Payer: MEDICAID | Source: Intra-hospital | Attending: Psychiatry | Admitting: Psychiatry

## 2024-09-07 ENCOUNTER — Encounter (HOSPITAL_COMMUNITY): Payer: Self-pay

## 2024-09-07 DIAGNOSIS — F322 Major depressive disorder, single episode, severe without psychotic features: Secondary | ICD-10-CM | POA: Insufficient documentation

## 2024-09-07 DIAGNOSIS — F32A Depression, unspecified: Secondary | ICD-10-CM | POA: Insufficient documentation

## 2024-09-07 DIAGNOSIS — F603 Borderline personality disorder: Secondary | ICD-10-CM | POA: Insufficient documentation

## 2024-09-07 DIAGNOSIS — F1420 Cocaine dependence, uncomplicated: Secondary | ICD-10-CM | POA: Insufficient documentation

## 2024-09-07 DIAGNOSIS — F419 Anxiety disorder, unspecified: Secondary | ICD-10-CM | POA: Insufficient documentation

## 2024-09-07 DIAGNOSIS — E871 Hypo-osmolality and hyponatremia: Secondary | ICD-10-CM | POA: Diagnosis present

## 2024-09-07 DIAGNOSIS — F1290 Cannabis use, unspecified, uncomplicated: Secondary | ICD-10-CM | POA: Insufficient documentation

## 2024-09-07 DIAGNOSIS — F17200 Nicotine dependence, unspecified, uncomplicated: Secondary | ICD-10-CM | POA: Diagnosis present

## 2024-09-07 DIAGNOSIS — R45851 Suicidal ideations: Secondary | ICD-10-CM | POA: Insufficient documentation

## 2024-09-07 DIAGNOSIS — F1910 Other psychoactive substance abuse, uncomplicated: Secondary | ICD-10-CM | POA: Diagnosis not present

## 2024-09-07 DIAGNOSIS — G4700 Insomnia, unspecified: Secondary | ICD-10-CM | POA: Insufficient documentation

## 2024-09-07 DIAGNOSIS — F333 Major depressive disorder, recurrent, severe with psychotic symptoms: Secondary | ICD-10-CM | POA: Diagnosis present

## 2024-09-07 DIAGNOSIS — F1920 Other psychoactive substance dependence, uncomplicated: Secondary | ICD-10-CM

## 2024-09-07 DIAGNOSIS — F1520 Other stimulant dependence, uncomplicated: Secondary | ICD-10-CM | POA: Diagnosis present

## 2024-09-07 DIAGNOSIS — F22 Delusional disorders: Secondary | ICD-10-CM | POA: Diagnosis not present

## 2024-09-07 DIAGNOSIS — F10939 Alcohol use, unspecified with withdrawal, unspecified: Secondary | ICD-10-CM | POA: Diagnosis present

## 2024-09-07 DIAGNOSIS — F1020 Alcohol dependence, uncomplicated: Secondary | ICD-10-CM | POA: Diagnosis present

## 2024-09-07 DIAGNOSIS — F1220 Cannabis dependence, uncomplicated: Secondary | ICD-10-CM | POA: Diagnosis present

## 2024-09-07 DIAGNOSIS — F332 Major depressive disorder, recurrent severe without psychotic features: Secondary | ICD-10-CM | POA: Diagnosis not present

## 2024-09-07 DIAGNOSIS — F4310 Post-traumatic stress disorder, unspecified: Secondary | ICD-10-CM | POA: Diagnosis present

## 2024-09-07 DIAGNOSIS — Z79899 Other long term (current) drug therapy: Secondary | ICD-10-CM | POA: Diagnosis not present

## 2024-09-07 DIAGNOSIS — F1510 Other stimulant abuse, uncomplicated: Secondary | ICD-10-CM | POA: Diagnosis present

## 2024-09-07 DIAGNOSIS — Z87891 Personal history of nicotine dependence: Secondary | ICD-10-CM | POA: Insufficient documentation

## 2024-09-07 LAB — COMPREHENSIVE METABOLIC PANEL WITH GFR
ALT: 14 U/L (ref 0–44)
AST: 22 U/L (ref 15–41)
Albumin: 4.4 g/dL (ref 3.5–5.0)
Alkaline Phosphatase: 67 U/L (ref 38–126)
Anion gap: 14 (ref 5–15)
BUN: 6 mg/dL (ref 6–20)
CO2: 20 mmol/L — ABNORMAL LOW (ref 22–32)
Calcium: 9.1 mg/dL (ref 8.9–10.3)
Chloride: 100 mmol/L (ref 98–111)
Creatinine, Ser: 0.72 mg/dL (ref 0.61–1.24)
GFR, Estimated: >60 mL/min (ref >60)
Glucose, Bld: 87 mg/dL (ref 70–99)
Potassium: 4.2 mmol/L (ref 3.5–5.1)
Sodium: 134 mmol/L — ABNORMAL LOW (ref 135–145)
Total Bilirubin: 0.7 mg/dL (ref 0.0–1.2)
Total Protein: 7.8 g/dL (ref 6.5–8.1)

## 2024-09-07 LAB — ETHANOL: Alcohol, Ethyl (B): <15 mg/dL (ref <15)

## 2024-09-07 LAB — RAPID URINE DRUG SCREEN, HOSP PERFORMED
Amphetamines: POSITIVE — AB
Barbiturates: NOT DETECTED
Benzodiazepines: NOT DETECTED
Cocaine: POSITIVE — AB
Opiates: NOT DETECTED
Tetrahydrocannabinol: POSITIVE — AB

## 2024-09-07 LAB — CBC
HCT: 46.5 % (ref 39.0–52.0)
Hemoglobin: 15.5 g/dL (ref 13.0–17.0)
MCH: 32.4 pg (ref 26.0–34.0)
MCHC: 33.3 g/dL (ref 30.0–36.0)
MCV: 97.3 fL (ref 80.0–100.0)
Platelets: 266 K/uL (ref 150–400)
RBC: 4.78 MIL/uL (ref 4.22–5.81)
RDW: 13.2 % (ref 11.5–15.5)
WBC: 6.4 K/uL (ref 4.0–10.5)
nRBC: 0 % (ref 0.0–0.2)

## 2024-09-07 MED ORDER — HYDROXYZINE HCL 25 MG PO TABS: 25.0000 mg | ORAL_TABLET | Freq: Three times a day (TID) | ORAL | Status: DC | PRN

## 2024-09-07 MED ORDER — OLANZAPINE 10 MG PO TABS: 5.0000 mg | ORAL_TABLET | Freq: Every day | ORAL | Status: DC

## 2024-09-07 NOTE — ED Notes (Addendum)
 Belongings have been inventoried and placed in [place] [address], valuables (phone and wallet) were given to security per policy. Patient didn't brought any medications. He did have a small bag of unknown origin, containing some leafs, which were given to security and disposed to appropriately by security. Patient was wanded by security.

## 2024-09-07 NOTE — ED Notes (Signed)
 Patient wanded by security.

## 2024-09-07 NOTE — ED Provider Notes (Addendum)
 [place] EMERGENCY DEPARTMENT AT [facility] Provider Note   CSN: [id number] Arrival date & time: 09/07/24  1248     Patient presents with: Suicidal   Carlos French is a 33 y.o. male.   33 year old who presents with suicidal ideations.  States that he is unsure if he has a plan.  States that he has tried commit suicide in the past.  Does have a history of mental illness but is not taking any medications currently.  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  Has noted increased anxiety.  Denies any HI.  No visual or auditory hallucinations.  Some paranoia.  Presents via EMS       Prior to Admission medications   Medication Sig Start Date End Date Taking? Authorizing Provider  amoxicillin -clavulanate (AUGMENTIN ) 875-125 MG tablet Take 1 tablet by mouth every 12 (twelve) hours. 11/12/23   Palumbo, April, MD  hydrOXYzine  (ATARAX /VISTARIL ) 25 MG tablet Take 1 tablet (25 mg total) by mouth every 6 (six) hours as needed. For anxiety Patient not taking: Reported on 05/18/2022 04/15/20   Collene Gouge I, NP  mirtazapine  (REMERON ) 15 MG tablet Take 1 tablet (15 mg total) by mouth at bedtime. For depression/sleep Patient not taking: Reported on 05/18/2022 04/15/20   Collene Gouge I, NP  Multiple Vitamin (MULTIVITAMIN WITH MINERALS) TABS tablet Take 1 tablet by mouth daily. Patient not taking: Reported on 05/18/2022    [provider]  nicotine  polacrilex (NICORETTE ) 2 MG gum Take 1 each (2 mg total) by mouth as needed. (May buy from over the counter): For smoking cessation Patient not taking: Reported on 05/18/2022 04/15/20   Collene Gouge I, NP  omeprazole  (PRILOSEC) 20 MG capsule Take 1 capsule (20 mg total) by mouth 2 (two) times daily before a meal. Patient not taking: Reported on 05/18/2022 11/29/20   Wieters, Hallie C, PA-C  ondansetron  (ZOFRAN  ODT) 4 MG disintegrating tablet Take 1 tablet (4 mg total) by mouth every 8 (eight) hours as needed for nausea or  vomiting. Patient not taking: Reported on 05/18/2022 11/29/20   Wieters, Hallie C, PA-C  sucralfate  (CARAFATE ) 1 g tablet Take 1 tablet (1 g total) by mouth 4 (four) times daily -  with meals and at bedtime. 05/18/22   Raspet, Erin K, PA-C  traZODone  (DESYREL ) 50 MG tablet Take 1 tablet (50 mg total) by mouth at bedtime as needed for sleep. Patient not taking: Reported on 05/18/2022 04/15/20   Collene Gouge I, NP    Allergies: Acetaminophen  and Ibuprofen     Review of Systems  All other systems reviewed and are negative.   Updated Vital Signs BP (!) 158/87   Pulse 82   Temp 98.2 F (36.8 C)   Resp 17   SpO2 98%   Physical Exam Vitals and nursing note reviewed.  Constitutional:      General: He is not in acute distress.    Appearance: Normal appearance. He is well-developed. He is not toxic-appearing.  HENT:     Head: Normocephalic and atraumatic.  Eyes:     General: Lids are normal.     Conjunctiva/sclera: Conjunctivae normal.     Pupils: Pupils are equal, round, and reactive to light.  Neck:     Thyroid: No thyroid mass.     Trachea: No tracheal deviation.  Cardiovascular:     Rate and Rhythm: Normal rate and regular rhythm.     Heart sounds: Normal heart sounds. No murmur heard.    No  gallop.  Pulmonary:     Effort: Pulmonary effort is normal. No respiratory distress.     Breath sounds: Normal breath sounds. No stridor. No decreased breath sounds, wheezing, rhonchi or rales.  Abdominal:     General: There is no distension.     Palpations: Abdomen is soft.     Tenderness: There is no abdominal tenderness. There is no rebound.  Musculoskeletal:        General: No tenderness. Normal range of motion.     Cervical back: Normal range of motion and neck supple.  Skin:    General: Skin is warm and dry.     Findings: No abrasion or rash.  Neurological:     Mental Status: He is alert and oriented to person, place, and time. Mental status is at baseline.     GCS: GCS eye  subscore is 4. GCS verbal subscore is 5. GCS motor subscore is 6.     Cranial Nerves: No cranial nerve deficit.     Sensory: No sensory deficit.     Motor: Motor function is intact.  Psychiatric:        Attention and Perception: Attention normal.        Speech: Speech normal.        Behavior: Behavior normal.        Thought Content: Thought content is paranoid. Thought content includes suicidal ideation. Thought content includes suicidal plan.     (all labs ordered are listed, but only abnormal results are displayed) Labs Reviewed  COMPREHENSIVE METABOLIC PANEL WITH GFR  ETHANOL  CBC  RAPID URINE DRUG SCREEN, HOSP PERFORMED    EKG: None  Radiology: No results found.   Procedures   Medications Ordered in the ED - No data to display                                  Medical Decision Making Amount and/or Complexity of Data Reviewed Labs: ordered.   Patient with stable vital signs here.  Patient will have labs checked.  He is medically cleared at this time pending the result of those.  Will consult psychiatry  2:36 PM Labs reviewed.  No significant abnormality appreciated Patient medically clear for psychiatric disposition     Final diagnoses:  None    ED Discharge Orders     None          Dasie Faden, MD 09/07/24 1340    Dasie Faden, MD 09/07/24 1436

## 2024-09-07 NOTE — ED Notes (Signed)
 Belongings obtained from security by RN.

## 2024-09-07 NOTE — ED Notes (Signed)
 Staffing called, no available sitters

## 2024-09-07 NOTE — ED Notes (Signed)
 Patient endorses urinating when he changed clothes into paper scrubs. Patient reminded to provide urine sample when able.

## 2024-09-07 NOTE — ED Provider Notes (Signed)
 Patient is a 33 year old male with history of PTSD, depression, alcohol dependence, and bipolar disorder who was evaluated in the ED for suicidal ideation and recommended for admission. He is alert and oriented, continues to endorse passive suicidal ideation without a plan, and denies current withdrawal symptoms. No new complaints at this time.

## 2024-09-07 NOTE — ED Notes (Signed)
 This RN attempted to give report to [facility] RN but per [employer]omatic Services operational officer nurse was unavailable. RN to attempt in a few minutes.

## 2024-09-07 NOTE — ED Notes (Signed)
 Patient is in the bedroom sleeping NAD. Will continue to monitor for safety.

## 2024-09-07 NOTE — ED Notes (Addendum)
 Patient endorses he has had recent thoughts and is currently suicidal. Patient endorses trying to cut himself in the past previously with a knife and abuses fentanyl and just about anything else. Patient endorses being alone without a support system but endorses living with biological mother.

## 2024-09-07 NOTE — ED Notes (Signed)
No sitter at bedside.

## 2024-09-07 NOTE — Progress Notes (Signed)
 [facility] LCSW Progress Note   09/07/2024    4:01 PM  Carlos French   [id number]   Type of Contact and Topic:  Psychiatric Bed Placement   Pt accepted to [facility]  The attending provider will be Dr. Cole  Call report to [phone]  Suzen Potts, RN @ [facility] ED notified.     Pt scheduled  to arrive at [facility].    Bunnie Gallop, MSW, LCSW-A  4:02 PM 09/07/2024

## 2024-09-07 NOTE — ED Provider Notes (Signed)
  [place] EMERGENCY DEPARTMENT AT [facility] Provider Assume Care Note I assumed care of Carlos French on 09/07/2024 at 3:30 PM from Dr. Dasie.   Briefly, Carlos French is a 33 y.o. male who: PMHx: Anxiety P/w suicidal ideation without plan, medically cleared, here voluntarily, awaiting TTS evaluation  Plan at the time of handoff: Follow-up TTS recommendations   Please refer to the original provider's note for additional information regarding the care of [employer].  Reassessment: I personally reassessed the patient: Patient resting quietly in bed  Vital Signs:  ED Triage Vitals  Encounter Vitals Group     BP 09/07/24 1255 (!) 158/87     Girls Systolic BP Percentile --      Girls Diastolic BP Percentile --      Boys Systolic BP Percentile --      Boys Diastolic BP Percentile --      Pulse Rate 09/07/24 1255 82     Resp 09/07/24 1255 17     Temp 09/07/24 1255 98.2 F (36.8 C)     Temp src --      SpO2 09/07/24 1249 100 %     Weight --      Height --      Head Circumference --      Peak Flow --      Pain Score 09/07/24 1255 0     Pain Loc --      Pain Education --      Exclude from Growth Chart --      Hemodynamics:  The patient is hemodynamically stable. Mental Status:  The patient is alert  Additional MDM: Psychiatry evaluated the patient, and feels that he is appropriate for admission.  Was transferred to be hook using [facility] safe transport during my shift.  Disposition: Transfer to behavioral health facility for continued mental health care   FREDRIK CANDIE Later, MD Emergency Medicine    Later Jerilynn RAMAN, MD 09/07/24 [phone]

## 2024-09-07 NOTE — ED Notes (Signed)
 Voluntary consent for treatment signed by patient and witnessed by this RN.

## 2024-09-07 NOTE — ED Notes (Signed)
 Psych NP at bedside
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
## 2024-09-07 NOTE — ED Triage Notes (Signed)
 PT arrives via EMS from a friend's house. PT endorses SI without a plan. States he has been having thoughts of suicide for several months. Pt reports he is very anxious as well. He denies HI, no visual or auditory hallucinations. He does, however, feel like people are out to kill him. Pt is AxOx4. Calm and cooperative during triage.

## 2024-09-07 NOTE — Group Note (Deleted)
 Group Topic: Emotional Regulation  Group Date: 09/07/2024 Start Time: 2000 End Time: 2100 Facilitators: Luvenia Mae SAUNDERS, NT  Department: [facility]  Number of Participants: 9  Group Focus: self-awareness Treatment Modality:  Leisure Development Interventions utilized were assignment Purpose: express feelings   Name: Carlos French Date of Birth: 09/27/1991  MR: [id number]    Level of Participation: {THERAPIES; PSYCH GROUP PARTICIPATION OZCZO:76008} Quality of Participation: {THERAPIES; PSYCH QUALITY OF PARTICIPATION:23992} Interactions with others: {THERAPIES; PSYCH INTERACTIONS:23993} Mood/Affect: {THERAPIES; PSYCH MOOD/AFFECT:23994} Triggers (if applicable): *** Cognition: {THERAPIES; PSYCH COGNITION:23995} Progress: {THERAPIES; PSYCH PROGRESS:23997} Response: *** Plan: {THERAPIES; PSYCH EOJW:76003}  Patients Problems:  Patient Active Problem List   Diagnosis Date Noted   Polysubstance abuse (HCC) 09/07/2024   Alcohol abuse with alcohol-induced disorder (HCC)    Moderate cannabis use disorder (HCC) 09/30/2018   PTSD (post-traumatic stress disorder) 09/16/2018   MDD (major depressive disorder), recurrent severe, without psychosis (HCC) 11/15/2017   MDD (major depressive disorder), recurrent, severe, with psychosis (HCC) 11/03/2017   Depression    Borderline personality disorder (HCC) 02/10/2016   Substance induced mood disorder (HCC) 02/09/2016   Alcohol withdrawal (HCC) 08/23/2015   Tobacco use disorder 08/23/2015   Cocaine use disorder, severe, dependence (HCC) 08/11/2015   Alcohol use disorder, severe, dependence (HCC) 08/11/2015   Cannabis use disorder, severe, dependence (HCC) 08/11/2015

## 2024-09-08 ENCOUNTER — Encounter (HOSPITAL_COMMUNITY): Payer: Self-pay | Admitting: Psychiatry

## 2024-09-08 DIAGNOSIS — F1510 Other stimulant abuse, uncomplicated: Secondary | ICD-10-CM | POA: Diagnosis present

## 2024-09-08 DIAGNOSIS — F332 Major depressive disorder, recurrent severe without psychotic features: Secondary | ICD-10-CM | POA: Diagnosis present

## 2024-09-08 LAB — COMPREHENSIVE METABOLIC PANEL WITH GFR
ALT: 17 U/L (ref 0–44)
AST: 25 U/L (ref 15–41)
Albumin: 4.7 g/dL (ref 3.5–5.0)
Alkaline Phosphatase: 74 U/L (ref 38–126)
Anion gap: 13 (ref 5–15)
BUN: 16 mg/dL (ref 6–20)
CO2: 28 mmol/L (ref 22–32)
Calcium: 10.3 mg/dL (ref 8.9–10.3)
Chloride: 91 mmol/L — ABNORMAL LOW (ref 98–111)
Creatinine, Ser: 0.98 mg/dL (ref 0.61–1.24)
GFR, Estimated: >60 mL/min (ref >60)
Glucose, Bld: 158 mg/dL — ABNORMAL HIGH (ref 70–99)
Potassium: 4.4 mmol/L (ref 3.5–5.1)
Sodium: 132 mmol/L — ABNORMAL LOW (ref 135–145)
Total Bilirubin: 1 mg/dL (ref 0.0–1.2)
Total Protein: 8.2 g/dL — ABNORMAL HIGH (ref 6.5–8.1)

## 2024-09-08 LAB — HEPATITIS PANEL, ACUTE
HCV Ab: NONREACTIVE
Hep A IgM: NONREACTIVE
Hep B C IgM: NONREACTIVE
Hepatitis B Surface Ag: NONREACTIVE

## 2024-09-08 LAB — TSH: TSH: 5.382 u[IU]/mL — ABNORMAL HIGH (ref 0.350–4.500)

## 2024-09-08 MED ORDER — CHLORDIAZEPOXIDE HCL 25 MG PO CAPS
25.0000 mg | ORAL_CAPSULE | Freq: Every day | ORAL | Status: AC
  Administered 2024-09-11: 25 mg via ORAL
  Filled 2024-09-08: qty 1

## 2024-09-08 MED ORDER — THIAMINE MONONITRATE 100 MG PO TABS
100.0000 mg | ORAL_TABLET | Freq: Every day | ORAL | Status: DC
  Administered 2024-09-08 – 2024-09-11 (×4): 100 mg via ORAL
  Filled 2024-09-08 (×4): qty 1

## 2024-09-08 MED ORDER — NICOTINE 14 MG/24HR TD PT24
14.0000 mg | MEDICATED_PATCH | Freq: Every day | TRANSDERMAL | Status: DC
Start: 2024-09-08 — End: 2024-09-11
  Administered 2024-09-08 – 2024-09-10 (×3): 14 mg via TRANSDERMAL
  Filled 2024-09-08 (×3): qty 1

## 2024-09-08 MED ORDER — ADULT MULTIVITAMIN W/MINERALS CH
1.0000 | ORAL_TABLET | Freq: Every day | ORAL | Status: DC
  Administered 2024-09-08 – 2024-09-11 (×4): 1 via ORAL
  Filled 2024-09-08 (×4): qty 1

## 2024-09-08 MED ORDER — NICOTINE POLACRILEX 2 MG MT GUM
2.0000 mg | CHEWING_GUM | OROMUCOSAL | Status: DC | PRN
  Administered 2024-09-09 – 2024-09-10 (×4): 2 mg via ORAL
  Filled 2024-09-08 (×4): qty 1

## 2024-09-08 MED ORDER — HALOPERIDOL 5 MG PO TABS: 5.0000 mg | ORAL_TABLET | Freq: Three times a day (TID) | ORAL | Status: DC | PRN

## 2024-09-08 MED ORDER — MAGNESIUM HYDROXIDE 400 MG/5ML PO SUSP: 30.0000 mL | Freq: Every day | ORAL | Status: DC | PRN

## 2024-09-08 MED ORDER — LOPERAMIDE HCL 2 MG PO CAPS: 2.0000 mg | ORAL_CAPSULE | ORAL | Status: AC | PRN

## 2024-09-08 MED ORDER — CHLORDIAZEPOXIDE HCL 25 MG PO CAPS
25.0000 mg | ORAL_CAPSULE | Freq: Four times a day (QID) | ORAL | Status: AC
  Administered 2024-09-08 (×3): 25 mg via ORAL
  Filled 2024-09-08 (×3): qty 1

## 2024-09-08 MED ORDER — DIPHENHYDRAMINE HCL 50 MG/ML IJ SOLN: 50.0000 mg | Freq: Three times a day (TID) | INTRAMUSCULAR | Status: DC | PRN

## 2024-09-08 MED ORDER — OLANZAPINE 5 MG PO TABS
5.0000 mg | ORAL_TABLET | Freq: Every day | ORAL | Status: DC
  Administered 2024-09-08 – 2024-09-10 (×3): 5 mg via ORAL
  Filled 2024-09-08 (×3): qty 1

## 2024-09-08 MED ORDER — CHLORDIAZEPOXIDE HCL 25 MG PO CAPS
25.0000 mg | ORAL_CAPSULE | Freq: Three times a day (TID) | ORAL | Status: AC
  Administered 2024-09-09 (×3): 25 mg via ORAL
  Filled 2024-09-08 (×3): qty 1

## 2024-09-08 MED ORDER — CHLORDIAZEPOXIDE HCL 25 MG PO CAPS
25.0000 mg | ORAL_CAPSULE | ORAL | Status: AC
  Administered 2024-09-10 (×2): 25 mg via ORAL
  Filled 2024-09-08 (×2): qty 1

## 2024-09-08 MED ORDER — HYDROXYZINE HCL 25 MG PO TABS: 25.0000 mg | ORAL_TABLET | Freq: Three times a day (TID) | ORAL | Status: DC | PRN

## 2024-09-08 MED ORDER — ALUM & MAG HYDROXIDE-SIMETH 200-200-20 MG/5ML PO SUSP
30.0000 mL | ORAL | Status: DC | PRN
  Administered 2024-09-09: 30 mL via ORAL
  Filled 2024-09-08: qty 30

## 2024-09-08 MED ORDER — TRAZODONE HCL 50 MG PO TABS
50.0000 mg | ORAL_TABLET | Freq: Every evening | ORAL | Status: DC | PRN
  Administered 2024-09-08 – 2024-09-09 (×2): 50 mg via ORAL
  Filled 2024-09-08 (×3): qty 1

## 2024-09-08 MED ORDER — HALOPERIDOL LACTATE 5 MG/ML IJ SOLN: 5.0000 mg | Freq: Three times a day (TID) | INTRAMUSCULAR | Status: DC | PRN

## 2024-09-08 MED ORDER — HYDROXYZINE HCL 25 MG PO TABS
25.0000 mg | ORAL_TABLET | Freq: Four times a day (QID) | ORAL | Status: AC | PRN
  Filled 2024-09-08: qty 1

## 2024-09-08 MED ORDER — LORAZEPAM 2 MG/ML IJ SOLN: 2.0000 mg | Freq: Three times a day (TID) | INTRAMUSCULAR | Status: DC | PRN

## 2024-09-08 MED ORDER — DIPHENHYDRAMINE HCL 50 MG PO CAPS: 50.0000 mg | ORAL_CAPSULE | Freq: Three times a day (TID) | ORAL | Status: DC | PRN

## 2024-09-08 MED ORDER — ONDANSETRON 4 MG PO TBDP
4.0000 mg | ORAL_TABLET | Freq: Four times a day (QID) | ORAL | Status: AC | PRN
  Filled 2024-09-08: qty 1

## 2024-09-08 MED ORDER — LORAZEPAM 1 MG PO TABS: 1.0000 mg | ORAL_TABLET | Freq: Four times a day (QID) | ORAL | Status: AC | PRN

## 2024-09-08 MED ORDER — HALOPERIDOL LACTATE 5 MG/ML IJ SOLN: 10.0000 mg | Freq: Three times a day (TID) | INTRAMUSCULAR | Status: DC | PRN

## 2024-09-08 NOTE — ED Notes (Signed)
 RN assessed patient while he was walking throughout the unit. He has no SI or wants to self harm. Patient expressed that he is hungry after receiving his nightly medications. Consumed a sandwich and beverage prior to going to bed. He is very polite and has a shy demeanor. No complaints of pain at this time.

## 2024-09-08 NOTE — ED Provider Notes (Signed)
 Facility Based Crisis Admission H&P  Date: 09/08/24 Patient Name: Carlos French MRN: [id number] Chief Complaint: real bad anxiety  Diagnoses:  Final diagnoses:  Polysubstance (excluding opioids) dependence (HCC)    HPI: Chancey Ringel is a 33 YO M with a previous diagnosis of borderline personality, polysubstance dependence, substance induced mood disorder, depression & psychosis who presented to the ER with anxiety, depression and thoughts of dying in the setting of ongoing polysubstance use.   On interview the patient is cooperative and pleasant. He has been using cocaine, alcohol and cannabis for some time, but recently started using methamphetamines too. He is not sleeping or eating very well and has lost about 20 pounds recently. He has been feeling like he is being followed and that people are out to get him. He has not seen or heard things. He thinks of death and dying but is able to contract for safety. No homicidal ideation.   PHQ 2-9:   Flowsheet Row ED from 09/07/2024 in [facility] Emergency Department at [facility] ED from 11/12/2023 in [facility] Emergency Department at [facility] UC from 05/18/2022 in [facility] at [facility])  C-SSRS RISK CATEGORY High Risk No Risk No Risk    Screenings    Flowsheet Row Most Recent Value  CIWA-Ar Total 0    Total Time spent with patient: 30 minutes  Musculoskeletal  Strength & Muscle Tone: within normal limits Gait & Station: normal Patient leans: N/A  Psychiatric Specialty Exam  Presentation General Appearance: Casual  Eye Contact:Good  Speech:Normal Rate  Speech Volume:Normal  Handedness:Right   Mood and Affect  Mood:Anxious; Depressed  Affect:Congruent   Thought Process  Thought Processes:Coherent  Descriptions of Associations:Intact  Orientation:Full (Time, Place and Person)  Thought Content:Logical    Hallucinations:Hallucinations: None  Ideas of  Reference:Paranoia; Percusatory  Suicidal Thoughts:Suicidal Thoughts: Yes, Passive SI Passive Intent and/or Plan: Without Intent  Homicidal Thoughts:Homicidal Thoughts: No   Sensorium  Memory:Immediate Good; Recent Good; Remote Fair  Judgment:Fair  Insight:Fair   Executive Functions  Concentration:Fair  Attention Span:Fair  Recall:Good  Fund of Knowledge:Good  Language:Good   Psychomotor Activity  Psychomotor Activity:Psychomotor Activity: Restlessness; Tremor   Assets  Assets:Communication Skills; Desire for Improvement   Sleep  Sleep:Sleep: Poor   Nutritional Assessment (For OBS and FBC admissions only) Has the patient had a weight loss or gain of 10 pounds or more in the last 3 months?: Yes Has the patient had a decrease in food intake/or appetite?: Yes Does the patient have dental problems?: No Does the patient have eating habits or behaviors that may be indicators of an eating disorder including binging or inducing vomiting?: No Has the patient recently lost weight without trying?: 2 Has the patient been eating poorly because of a decreased appetite?: 1 Malnutrition Screening Tool Score: 3    Physical Exam ROS  Blood pressure (!) 141/87, pulse 100, temperature 98.1 F (36.7 C), temperature source Oral, resp. rate 20, SpO2 98%. There is no height or weight on file to calculate BMI.  Past Psychiatric History: borderline personality, polysubstance dependence, substance induced mood disorder, depression & psychosis. Has been to [facility]. Has been on remeron , hydroxyzine , rozerem , olanzapine , lithium , trazodone   Is the patient at risk to self? Yes  Has the patient been a risk to self in the past 6 months? Yes .    Has the patient been a risk to self within the distant past? No   Is the patient a risk  to others? No   Has the patient been a risk to others in the past 6 months? No   Has the patient been a risk to others within the distant past? No   Past  Medical History: see chart Family History: mother with unknown Social History: currently couch surfing. Works as a Scientific laboratory technician. Single. Has one child. Educated to the 10th grade. Denies legal issues. No guns, no military history, no trauma history Substance history: smokes 2 ppd. Alcohol daily, up to 12 beers. Cocaine daily. Recently started methamphetamines. Has history of seizures and withdrawal. Has been to [facility].   Last Labs:  Admission on 09/07/2024, Discharged on 09/07/2024  Component Date Value Ref Range Status   Sodium 09/07/2024 134 (L)  135 - 145 mmol/L Final   Potassium 09/07/2024 4.2  3.5 - 5.1 mmol/L Final   Chloride 09/07/2024 100  98 - 111 mmol/L Final   CO2 09/07/2024 20 (L)  22 - 32 mmol/L Final   Glucose, Bld 09/07/2024 87  70 - 99 mg/dL Final   Glucose reference range applies only to samples taken after fasting for at least 8 hours.   BUN 09/07/2024 6  6 - 20 mg/dL Final   Creatinine, Ser 09/07/2024 0.72  0.61 - 1.24 mg/dL Final   Calcium  09/07/2024 9.1  8.9 - 10.3 mg/dL Final   Total Protein 90/85/7974 7.8  6.5 - 8.1 g/dL Final   Albumin 90/85/7974 4.4  3.5 - 5.0 g/dL Final   AST 90/85/7974 22  15 - 41 U/L Final   ALT 09/07/2024 14  0 - 44 U/L Final   Alkaline Phosphatase 09/07/2024 67  38 - 126 U/L Final   Total Bilirubin 09/07/2024 0.7  0.0 - 1.2 mg/dL Final   GFR, Estimated 09/07/2024 >60  >60 mL/min Final   Comment: (NOTE) Calculated using the CKD-EPI Creatinine Equation (2021)    Anion gap 09/07/2024 14  5 - 15 Final   Performed at [facility] Lab, [address] [place], [place] [address]   Alcohol, Ethyl (B) 09/07/2024 <15  <15 mg/dL Final   Comment: (NOTE) For medical purposes only. Performed at [facility], [address] [place], [place] [address]    WBC 09/07/2024 6.4  4.0 - 10.5 K/uL Final   RBC 09/07/2024 4.78  4.22 - 5.81 MIL/uL Final   Hemoglobin 09/07/2024 15.5  13.0 - 17.0 g/dL Final    HCT 90/85/7974 46.5  39.0 - 52.0 % Final   MCV 09/07/2024 97.3  80.0 - 100.0 fL Final   MCH 09/07/2024 32.4  26.0 - 34.0 pg Final   MCHC 09/07/2024 33.3  30.0 - 36.0 g/dL Final   RDW 90/85/7974 13.2  11.5 - 15.5 % Final   Platelets 09/07/2024 266  150 - 400 K/uL Final   nRBC 09/07/2024 0.0  0.0 - 0.2 % Final   Performed at [facility], [address], [place], [place] [address]   Opiates 09/07/2024 NONE DETECTED  NONE DETECTED Final   Cocaine 09/07/2024 POSITIVE (A)  NONE DETECTED Final   Benzodiazepines 09/07/2024 NONE DETECTED  NONE DETECTED Final   Amphetamines 09/07/2024 POSITIVE (A)  NONE DETECTED Final   Comment: (NOTE) Trazodone  is metabolized in vivo to several metabolites, including pharmacologically active m-CPP, which is excreted in the urine. Immunoassay screens for amphetamines and MDMA have potential cross-reactivity with these compounds and may provide false positive  results.     Tetrahydrocannabinol 09/07/2024 POSITIVE (A)  NONE DETECTED Final   Barbiturates 09/07/2024 NONE DETECTED  NONE DETECTED Final   Comment: (NOTE) DRUG SCREEN FOR MEDICAL PURPOSES ONLY.  IF CONFIRMATION IS NEEDED FOR ANY PURPOSE, NOTIFY LAB WITHIN 5 DAYS.  LOWEST DETECTABLE LIMITS FOR URINE DRUG SCREEN Drug Class                     Cutoff (ng/mL) Amphetamine and metabolites    1000 Barbiturate and metabolites    200 Benzodiazepine                 200 Opiates and metabolites        300 Cocaine and metabolites        300 THC                            50 Performed at [facility], [address], [place], [place] [address]     Allergies: Tylenol  [acetaminophen ], Motrin  [ibuprofen ], and Nsaids  Medications:  Facility Ordered Medications  Medication   hydrOXYzine  (ATARAX ) tablet 25 mg   OLANZapine  (ZYPREXA ) tablet 5 mg   alum & mag hydroxide-simeth (MAALOX/MYLANTA) 200-200-20 MG/5ML suspension 30 mL   magnesium  hydroxide (MILK OF MAGNESIA) suspension 30 mL    haloperidol  (HALDOL ) tablet 5 mg   And   diphenhydrAMINE  (BENADRYL ) capsule 50 mg   haloperidol  lactate (HALDOL ) injection 5 mg   And   diphenhydrAMINE  (BENADRYL ) injection 50 mg   And   LORazepam  (ATIVAN ) injection 2 mg   haloperidol  lactate (HALDOL ) injection 10 mg   And   diphenhydrAMINE  (BENADRYL ) injection 50 mg   And   LORazepam  (ATIVAN ) injection 2 mg   traZODone  (DESYREL ) tablet 50 mg   thiamine  (VITAMIN B1) tablet 100 mg   multivitamin with minerals tablet 1 tablet   LORazepam  (ATIVAN ) tablet 1 mg   hydrOXYzine  (ATARAX ) tablet 25 mg   loperamide  (IMODIUM ) capsule 2-4 mg   ondansetron  (ZOFRAN -ODT) disintegrating tablet 4 mg   chlordiazePOXIDE  (LIBRIUM ) capsule 25 mg   Followed by   [name] ON 09/09/2024] chlordiazePOXIDE  (LIBRIUM ) capsule 25 mg   Followed by   [name] ON 09/10/2024] chlordiazePOXIDE  (LIBRIUM ) capsule 25 mg   Followed by   [name] ON 09/11/2024] chlordiazePOXIDE  (LIBRIUM ) capsule 25 mg      Medical Decision Making  Patient seeking detox from multiple substances. Suspect that the paranoia is substance induced, but low mood may be independent factor    Recommendations  Long Term Goals: Improvement in symptoms so as ready for discharge   Short Term Goals: Patient will verbalize feelings in meetings with treatment team members., Patient will attend at least of 50% of the groups daily., Pt will complete the PHQ9 on admission, day 3 and discharge., and Patient will take medications as prescribed daily.  Medications: Mood/anxiety: continue group therapy, milieu therapy, 1:1 evaluation with provider.  Medication management: zyprexa  5mg  PO QHS  Substance Abuse:  brief intervention provided abstinence advised.  stimulant use: encourage patient to maintain PO intake. Coverage PRN for secondary psychosis.  Severe alcohol use disorder: replacing thiamine . Encourage adequate PO intake. May require lab monitoring of electrolytes. Benzodiazepine taper as tolerated.  Seizure precautions. Monitor for delirium tremens.  Cannabis use disorder: Encouraging insight. Education provided on the effect of cannabis on mental health. Coverage for withdrawal symptoms and nausea.  Nicotine  and tobacco use: Nicotine  replacement therapy provided.  Medical: PRNs for pain, constipation, indigestion available.  Labs/studies: Acute hepatitis panel, HIV and  RPR: substance users and mental illness increases the risk of exposure to blood born pathogens, both from lifestyle, high risk behaviors and intimate contact. This is often ordered as a screening for new mental health presentation, or known exposure.  TSH, free T4, thyroid evaluation: to rule out thyroid involvement, as abnormalities of function can produce symptoms that are psychiatric in nature and exacerbate underlying conditions, as well as be the sole driver for some presentations  Safety and Monitoring: voluntarily admission to [facility]/Facility based care unit [facility]) unit for safety, stabilization and treatment Daily contact with patient to assess and evaluate symptoms and progress in treatment Patient's case to be discussed in multi-disciplinary team meeting Observation Level : q15 minute checks Vital signs: q12 hours Precautions: withdrawal and seizure  Based on my evaluation the patient does not appear to have an emergency medical condition.   Corean Anette Potters, MD 09/08/24  2:57 PM

## 2024-09-08 NOTE — ED Notes (Signed)
Patient asleep, NAD 

## 2024-09-08 NOTE — ED Notes (Signed)
 Pt actively participating in group. No acute distress noted. No concerns voiced. Informed pt to notify staff with any needs or assistance. Pt verbalized understanding and agreement. Will continue to monitor for safety.

## 2024-09-08 NOTE — Group Note (Signed)
 Group Topic: Change and Accountability  Group Date: 09/08/2024 Start Time: 1930 End Time: 2000 Facilitators: Anice Benton LABOR, NT  Department: [facility]  Number of Participants: 6  Group Focus: clarity of thought, feeling awareness/expression, and self-awareness Treatment Modality:  Individual Therapy and Psychoeducation Interventions utilized were assignment Purpose: enhance coping skills, explore maladaptive thinking, and express feelings  Name: Carlos French Date of Birth: April 19, 1991  MR: [id number]    Level of Participation: Did Not Attend  Quality of Participation: N/A Interactions with others: N/A Mood/Affect: N/A Triggers (if applicable): N/A Cognition: N/A Progress: N/A Response: N/A Plan: patient will be encouraged to attend groups.   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

## 2024-09-08 NOTE — ED Notes (Signed)
 Patient A&Ox4. Denies intent to harm self/others when asked. Denies A/VH. Patient denies any physical complaints when asked. No acute distress noted. Support and encouragement provided. Routine safety checks conducted according to facility protocol. Encouraged patient to notify staff if thoughts of harm toward self or others arise. Patient verbalize understanding and agreement. Will continue to monitor for safety.

## 2024-09-08 NOTE — ED Notes (Signed)
 Pt sleeping in no acute distress. RR even and unlabored. Environment secured. Will continue to monitor for safety.

## 2024-09-08 NOTE — Care Management (Addendum)
 [facility] Care Management   Writer met with patient and discussed discharge planning.  The patient reports that he would like to participate in outpatient IOP substance abuse group therapy.   Writer placed a referral for outpatient IOP services.   The patient has requested assistance with transportation to his mother's home.  Writer will provide a taxi voucher to the patient home [address] [place].  4:08pm  Patient has an intake appointment with the [facility] on Monday 09-15-2024 at 8:30am   at [address] on [address] 2nd floor in [place],  [place]

## 2024-09-08 NOTE — Group Note (Signed)
 Group Topic: Change and Accountability  Group Date: 09/08/2024 Start Time: 1000 End Time: 1100 Facilitators: Alyse Leilani LABOR, NT  Department: [facility]  Number of Participants: 8  Group Focus: abuse issues, acceptance, and chemical dependency education Treatment Modality:  Interpersonal Therapy Interventions utilized were group exercise Purpose: enhance coping skills, express feelings, increase insight, and relapse prevention strategies  Name: Carlos French Date of Birth: 20-Dec-1991  MR: [id number]   Pt did not attend group Patients Problems:  Patient Active Problem List   Diagnosis Date Noted   MDD (major depressive disorder), recurrent episode, severe (HCC) 09/08/2024   Polysubstance abuse (HCC) 09/07/2024   Alcohol abuse with alcohol-induced disorder (HCC)    Moderate cannabis use disorder (HCC) 09/30/2018   PTSD (post-traumatic stress disorder) 09/16/2018   MDD (major depressive disorder), recurrent severe, without psychosis (HCC) 11/15/2017   MDD (major depressive disorder), recurrent, severe, with psychosis (HCC) 11/03/2017   Depression    Borderline personality disorder (HCC) 02/10/2016   Substance induced mood disorder (HCC) 02/09/2016   Alcohol withdrawal (HCC) 08/23/2015   Tobacco use disorder 08/23/2015   Cocaine use disorder, severe, dependence (HCC) 08/11/2015   Alcohol use disorder, severe, dependence (HCC) 08/11/2015   Cannabis use disorder, severe, dependence (HCC) 08/11/2015

## 2024-09-09 DIAGNOSIS — E871 Hypo-osmolality and hyponatremia: Secondary | ICD-10-CM | POA: Diagnosis present

## 2024-09-09 LAB — RPR: RPR Ser Ql: NONREACTIVE

## 2024-09-09 MED ORDER — ENSURE PLUS HIGH PROTEIN PO LIQD
237.0000 mL | Freq: Two times a day (BID) | ORAL | Status: DC
  Administered 2024-09-10 (×2): 237 mL via ORAL

## 2024-09-09 NOTE — ED Notes (Signed)
 Librium  taper for ETOH detox.  Pt reports hot and cold sweats, GI upset, and slight tremors.  Pt reported broken sleep pattern, Denied current SI plan and intent.   Denied HI and A/V hallucinations Q 15 minute observations for safety continue

## 2024-09-09 NOTE — ED Notes (Signed)
 Pt observed lying in bed. Eyes closed respirations even and non labored. NAD q 15 minute observations continue for safety.

## 2024-09-09 NOTE — Group Note (Signed)
 Group Topic: Social Support  Group Date: 09/09/2024 Start Time: 2000 End Time: 2130 Facilitators: Joan Plowman B  Department: [facility]  Number of Participants: 9  Group Focus: abuse issues, activities of daily living skills, anger management, community group, coping skills, feeling awareness/expression, goals/reality orientation, healthy friendships, personal responsibility, relapse prevention, and substance abuse education Treatment Modality:  Psychoeducation Interventions utilized were leisure development and support Purpose: express feelings  Name: Carlos French Date of Birth: 1991/04/12  MR: [id number]    Level of Participation: active Quality of Participation: cooperative Interactions with others: gave feedback Mood/Affect: appropriate Triggers (if applicable): NA Cognition: coherent/clear Progress: Gaining insight Response: NA Plan: patient will be encouraged to keep going to groups  Patients Problems:  Patient Active Problem List   Diagnosis Date Noted   Hyponatremia 09/09/2024   Methamphetamine abuse (HCC) 09/08/2024   Polysubstance abuse (HCC) 09/07/2024   PTSD (post-traumatic stress disorder) 09/16/2018   MDD (major depressive disorder), recurrent, severe, 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

## 2024-09-09 NOTE — [facility] Group Notes (Signed)
 Spirituality Group   Description: Participant directed exploration of values, beliefs and meaning  **Focus on values, community, and sources of support  Following a brief framework of chaplain's role and ground rules of group behavior, participants are invited to share concerns or questions that engage spiritual life. Emphasis placed on common themes and shared experiences and ways to make meaning and clarify living into one's values.   Theory/Process/Goal: Utilize the theoretical framework of group therapy established by Celena Kite, Relational Cultural Theory and Rogerian approaches to facilitate relational empathy and use of the "here and now" to foster reflection, self-awareness, and sharing.   Observations: Carlos French was an active participant in the group discussion.  Bettylou Frew L. Delores HERO.Div

## 2024-09-09 NOTE — ED Notes (Signed)
 Pt spoke with provider regarding request for discharge.   Pt not discharging today.  Possible discharge thursday

## 2024-09-09 NOTE — ED Notes (Signed)
 Pt is sleeping at the moment. No acute distress noted. Q15 safety checks in place.

## 2024-09-09 NOTE — ED Notes (Signed)
 Pt is sleeping at the moment. No acute distress noted. Q15 safety checks in place per facility policy.
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
## 2024-09-09 NOTE — ED Notes (Signed)
 Pt currently sleeping. Q15 safety checks in place.

## 2024-09-09 NOTE — ED Provider Notes (Signed)
 Behavioral Health Progress Note  Date and Time: 09/09/2024 3:56 PM Name: Carlos French MRN:  [id number]  Subjective:  Carlos French seen initially this morning. He is sleeping okay, but having vivid dreams. He is eating okay and asks for ensures. He feels that overall he is fine today. He is still having withdrawal, cramps and intermittent diarrhea and constipation. Reviewed the treatment plan, including goals for discharge.   Diagnosis:  Final diagnoses:  Polysubstance (excluding opioids) dependence (HCC)    Total Time spent with patient: 20 minutes  Past Psychiatric History: borderline personality, polysubstance dependence, substance induced mood disorder, depression & psychosis. Has been to [facility]. Has been on remeron , hydroxyzine , rozerem , olanzapine , lithium , trazodone  Past Medical History: see chart Family History: mother with unknown Social History: currently couch surfing. Works as a Scientific laboratory technician. Single. Has one child. Educated to the 10th grade. Denies legal issues. No guns, no military history, no trauma history Substance history: smokes 2 ppd. Alcohol daily, up to 12 beers. Cocaine daily. Recently started methamphetamines. Has history of seizures and withdrawal. Has been to [facility].   Sleep: Fair  Appetite:  Good  Current Medications:  Current Facility-Administered Medications  Medication Dose Route Frequency Provider Last Rate Last Admin   alum & mag hydroxide-simeth (MAALOX/MYLANTA) 200-200-20 MG/5ML suspension 30 mL  30 mL Oral Q4H PRN [name], [name], NP       chlordiazePOXIDE  (LIBRIUM ) capsule 25 mg  25 mg Oral TID [name], MD   25 mg at 09/09/24 9141   Followed by   [name] ON 09/10/2024] chlordiazePOXIDE  (LIBRIUM ) capsule 25 mg  25 mg Oral BH-qamhs Ellayna Hilligoss, [name], MD       Followed by   [name] ON 09/11/2024] chlordiazePOXIDE  (LIBRIUM ) capsule 25 mg  25 mg Oral Daily Clint Strupp, [name], MD       haloperidol   (HALDOL ) tablet 5 mg  5 mg Oral TID PRN [name], NP       And   diphenhydrAMINE  (BENADRYL ) capsule 50 mg  50 mg Oral TID PRN [name], [name], NP       haloperidol  lactate (HALDOL ) injection 5 mg  5 mg Intramuscular TID PRN [name], NP       And   diphenhydrAMINE  (BENADRYL ) injection 50 mg  50 mg Intramuscular TID PRN [name], NP       And   LORazepam  (ATIVAN ) injection 2 mg  2 mg Intramuscular TID PRN [name], [name], NP       haloperidol  lactate (HALDOL ) injection 10 mg  10 mg Intramuscular TID PRN [name], NP       And   diphenhydrAMINE  (BENADRYL ) injection 50 mg  50 mg Intramuscular TID PRN [name], NP       And   LORazepam  (ATIVAN ) injection 2 mg  2 mg Intramuscular TID PRN [name], NP       [START ON 09/10/2024] feeding supplement (ENSURE PLUS HIGH PROTEIN) liquid 237 mL  237 mL Oral BID BM Jakwan Sally, [name], MD       hydrOXYzine  (ATARAX ) tablet 25 mg  25 mg Oral TID PRN [name], [name], NP       hydrOXYzine  (ATARAX ) tablet 25 mg  25 mg Oral Q6H PRN [name], [name], NP       loperamide  (IMODIUM ) capsule 2-4 mg  2-4 mg Oral PRN [name], NP       LORazepam  (ATIVAN ) tablet 1  mg  1 mg Oral Q6H PRN [name], [name], NP       magnesium  hydroxide (MILK OF MAGNESIA) suspension 30 mL  30 mL Oral Daily PRN [name], [name], NP       multivitamin with minerals tablet 1 tablet  1 tablet Oral Daily [name], NP   1 tablet at 09/09/24 0901   nicotine  (NICODERM CQ  - dosed in mg/24 hours) patch 14 mg  14 mg Transdermal Daily Basel Defalco, [name], MD   14 mg at 09/09/24 0901   nicotine  polacrilex (NICORETTE ) gum 2 mg  2 mg Oral PRN [name], MD       OLANZapine  (ZYPREXA ) tablet 5 mg  5 mg Oral QHS [name], NP   5 mg at 09/08/24 2117   ondansetron  (ZOFRAN -ODT) disintegrating tablet 4 mg  4 mg Oral Q6H PRN [name], [name], NP       thiamine  (VITAMIN B1) tablet 100 mg  100 mg  Oral Daily [name], [name], NP   100 mg at 09/09/24 0901   traZODone  (DESYREL ) tablet 50 mg  50 mg Oral QHS PRN [name], [name], NP   50 mg at 09/08/24 2117   Current Outpatient Medications  Medication Sig Dispense Refill   clonazePAM  (KLONOPIN ) 0.5 MG tablet Take 0.5 mg by mouth daily as needed for anxiety.      Labs  Lab Results:  Admission on 09/07/2024  Component Date Value Ref Range Status   Sodium 09/08/2024 132 (L)  135 - 145 mmol/L Final   Potassium 09/08/2024 4.4  3.5 - 5.1 mmol/L Final   Chloride 09/08/2024 91 (L)  98 - 111 mmol/L Final   CO2 09/08/2024 28  22 - 32 mmol/L Final   Glucose, Bld 09/08/2024 158 (H)  70 - 99 mg/dL Final   Glucose reference range applies only to samples taken after fasting for at least 8 hours.   BUN 09/08/2024 16  6 - 20 mg/dL Final   Creatinine, Ser 09/08/2024 0.98  0.61 - 1.24 mg/dL Final   Calcium  09/08/2024 10.3  8.9 - 10.3 mg/dL Final   Total Protein 90/84/7974 8.2 (H)  6.5 - 8.1 g/dL Final   Albumin 90/84/7974 4.7  3.5 - 5.0 g/dL Final   AST 90/84/7974 25  15 - 41 U/L Final   ALT 09/08/2024 17  0 - 44 U/L Final   Alkaline Phosphatase 09/08/2024 74  38 - 126 U/L Final   Total Bilirubin 09/08/2024 1.0  0.0 - 1.2 mg/dL Final   GFR, Estimated 09/08/2024 >60  >60 mL/min Final   Comment: (NOTE) Calculated using the CKD-EPI Creatinine Equation (2021)    Anion gap 09/08/2024 13  5 - 15 Final   Performed at [facility], [address] [place], [place] [address]   RPR Ser Ql 09/08/2024 NON REACTIVE  NON REACTIVE Final   Performed at [facility], [address] [place], [place] [address]   Hepatitis B Surface Ag 09/08/2024 NON REACTIVE  NON REACTIVE Final   HCV Ab 09/08/2024 NON REACTIVE  NON REACTIVE Final   Comment: (NOTE) Nonreactive HCV antibody screen is consistent with no HCV infections,  unless recent infection is suspected or other evidence exists to indicate HCV infection.     Hep A IgM 09/08/2024 NON REACTIVE   NON REACTIVE Final   Hep B C IgM 09/08/2024 NON REACTIVE  NON REACTIVE Final   Performed at [facility] Lab, [address] [place], [place] [address]   TSH 09/08/2024 5.382 (H)  0.350 - 4.500 uIU/mL Final   Comment: Performed by a 3rd Generation assay with a functional sensitivity of <=0.01 uIU/mL. Performed at [facility], [address] [place], [place] [address]   Admission on 09/07/2024, Discharged on 09/07/2024  Component Date Value Ref Range Status   Sodium 09/07/2024 134 (L)  135 - 145 mmol/L Final   Potassium 09/07/2024 4.2  3.5 - 5.1 mmol/L Final   Chloride 09/07/2024 100  98 - 111 mmol/L Final   CO2 09/07/2024 20 (L)  22 - 32 mmol/L Final   Glucose, Bld 09/07/2024 87  70 - 99 mg/dL Final   Glucose reference range applies only to samples taken after fasting for at least 8 hours.   BUN 09/07/2024 6  6 - 20 mg/dL Final   Creatinine, Ser 09/07/2024 0.72  0.61 - 1.24 mg/dL Final   Calcium  09/07/2024 9.1  8.9 - 10.3 mg/dL Final   Total Protein 90/85/7974 7.8  6.5 - 8.1 g/dL Final   Albumin 90/85/7974 4.4  3.5 - 5.0 g/dL Final   AST 90/85/7974 22  15 - 41 U/L Final   ALT 09/07/2024 14  0 - 44 U/L Final   Alkaline Phosphatase 09/07/2024 67  38 - 126 U/L Final   Total Bilirubin 09/07/2024 0.7  0.0 - 1.2 mg/dL Final   GFR, Estimated 09/07/2024 >60  >60 mL/min Final   Comment: (NOTE) Calculated using the CKD-EPI Creatinine Equation (2021)    Anion gap 09/07/2024 14  5 - 15 Final   Performed at [facility], [address], [place], [place] [address]   Alcohol, Ethyl (B) 09/07/2024 <15  <15 mg/dL Final   Comment: (NOTE) For medical purposes only. Performed at [facility], [address] [place], [place] [address]    WBC 09/07/2024 6.4  4.0 - 10.5 K/uL Final   RBC 09/07/2024 4.78  4.22 - 5.81 MIL/uL Final   Hemoglobin 09/07/2024 15.5  13.0 - 17.0 g/dL Final   HCT 90/85/7974 46.5  39.0 - 52.0 % Final   MCV 09/07/2024 97.3  80.0 - 100.0 fL Final   MCH  09/07/2024 32.4  26.0 - 34.0 pg Final   MCHC 09/07/2024 33.3  30.0 - 36.0 g/dL Final   RDW 90/85/7974 13.2  11.5 - 15.5 % Final   Platelets 09/07/2024 266  150 - 400 K/uL Final   nRBC 09/07/2024 0.0  0.0 - 0.2 % Final   Performed at [facility], [address], [place], [place] [address]   Opiates 09/07/2024 NONE DETECTED  NONE DETECTED Final   Cocaine 09/07/2024 POSITIVE (A)  NONE DETECTED Final   Benzodiazepines 09/07/2024 NONE DETECTED  NONE DETECTED Final   Amphetamines 09/07/2024 POSITIVE (A)  NONE DETECTED Final   Comment: (NOTE) Trazodone  is metabolized in vivo to several metabolites, including pharmacologically active m-CPP, which is excreted in the urine. Immunoassay screens for amphetamines and MDMA have potential cross-reactivity with these compounds and may provide false positive  results.     Tetrahydrocannabinol 09/07/2024 POSITIVE (A)  NONE DETECTED Final   Barbiturates 09/07/2024 NONE DETECTED  NONE DETECTED Final   Comment: (NOTE) DRUG SCREEN FOR MEDICAL PURPOSES ONLY.  IF CONFIRMATION IS NEEDED FOR ANY PURPOSE, NOTIFY LAB WITHIN 5 DAYS.  LOWEST DETECTABLE LIMITS FOR URINE DRUG SCREEN Drug Class  Cutoff (ng/mL) Amphetamine and metabolites    1000 Barbiturate and metabolites    200 Benzodiazepine                 200 Opiates and metabolites        300 Cocaine and metabolites        300 THC                            50 Performed at [facility], [address], [place], [place] [address]     Blood Alcohol level:  Lab Results  Component Value Date   ETH <15 09/07/2024   ETH 171 (H) 04/12/2020    Metabolic Disorder Labs: Lab Results  Component Value Date   HGBA1C 4.6 02/10/2016   Lab Results  Component Value Date   PROLACTIN 32.3 (H) 02/10/2016   Lab Results  Component Value Date   CHOL 209 (H) 02/10/2016   TRIG 120 02/10/2016   HDL 86 02/10/2016   CHOLHDL 2.4 02/10/2016   VLDL 24 02/10/2016   LDLCALC 99  02/10/2016   LDLCALC 100 (H) 08/12/2015    Therapeutic Lab Levels: No results found for: LITHIUM  No results found for: VALPROATE No results found for: CBMZ  Physical Findings   AIMS    Flowsheet Row [facility]) [facility]) [facility] (Dis[facility] Ad[facility] (Dis[facility]) [facility] Ad[facility] (Discharged) from 02/09/2016 in [facility]  AIMS Total Score 0 0 0 0 0   AUDIT    Flowsheet Row [facility]) [facility] Ad[facility] (Discharged) [facility] Ad[facility] (Discharged[facility] Ad[facility] (Discharged) [facility] Admission (Discharged) from 02/09/2016 in [facility]PATIENT BE[facility] MEDICINE  Alcohol Use Disorder Identification Test Final Score (AUDIT) 35 34 35 40 31   Flowsheet Row ED from 09/07/2024 in [facility] at [facility] ED from 11/12/2023 in [facility] Department at [facility] UC from 05/18/2022 in [facility])  C-SSRS RISK CATEGORY High Risk No Risk No Risk     Musculoskeletal  Strength & Muscle Tone: within normal limits Gait & Station: normal Patient leans: N/A  Psychiatric Specialty Exam  Presentation  General Appearance:  Casual  Eye Contact: Good  Speech: Normal Rate  Speech Volume: Normal  Handedness: Right   Mood and Affect  Mood: Anxious  Affect: Constricted   Thought Process  Thought Processes: Goal Directed  Descriptions of  Associations:Intact  Orientation:Full (Time, Place and Person)  Thought Content:Logical     Hallucinations:Hallucinations: None  Ideas of Reference:None  Suicidal Thoughts:Suicidal Thoughts: No SI Passive Intent and/or Plan: Without Intent  Homicidal Thoughts:Homicidal Thoughts: No   Sensorium  Memory: Immediate Fair; Recent Fair  Judgment: Poor  Insight: Poor   Executive Functions  Concentration: Fair  Attention Span: Fair  Recall: Fair  Fund of Knowledge: Fair  Language: Good   Psychomotor Activity  Psychomotor Activity: Psychomotor Activity: Restlessness   Assets  Assets: Communication Skills; Leisure Time   Sleep  Sleep: Sleep: Fair  Estimated Sleeping Duration (Last 24 Hours): 14.25-15.25 hours  Nutritional  Assessment (For OBS and [facility] admissions only) Has the patient had a weight loss or gain of 10 pounds or more in the last 3 months?: Yes Has the patient had a decrease in food intake/or appetite?: Yes Does the patient have dental problems?: No Does the patient have eating habits or behaviors that may be indicators of an eating disorder including binging or inducing vomiting?: No Has the patient recently lost weight without trying?: 2 Has the patient been eating poorly because of a decreased appetite?: 1 Malnutrition Screening Tool Score: 3    Physical Exam  Physical Exam Vitals and nursing note reviewed.  Constitutional:      Appearance: Normal appearance.  HENT:     Head: Normocephalic.  Eyes:     Extraocular Movements: Extraocular movements intact.  Pulmonary:     Effort: Pulmonary effort is normal.  Musculoskeletal:        General: Normal range of motion.     Cervical back: Normal range of motion.  Neurological:     General: No focal deficit present.     Mental Status: He is alert and oriented to person, place, and time.  Psychiatric:        Mood and Affect: Mood is anxious.    Review of Systems  Constitutional:   Negative for fever.  Gastrointestinal:  Positive for abdominal pain, constipation and diarrhea. Negative for nausea and vomiting.  Genitourinary:  Negative for dysuria.  Musculoskeletal:  Negative for joint pain and myalgias.  Neurological:  Negative for tremors.  Psychiatric/Behavioral:  Negative for hallucinations and suicidal ideas. The patient is nervous/anxious.    Blood pressure 100/78, pulse 84, temperature 97.7 F (36.5 C), temperature source Oral, resp. rate 17, SpO2 97%. There is no height or weight on file to calculate BMI.  Treatment Plan Summary: Long Term Goals: Improvement in symptoms so as ready for discharge   Short Term Goals: Patient will verbalize feelings in meetings with treatment team members., Patient will attend at least of 50% of the groups daily., Pt will complete the PHQ9 on admission, day 3 and discharge., and Patient will take medications as prescribed daily.   Medications: Mood/anxiety: continue group therapy, milieu therapy, 1:1 evaluation with provider.  Medication management: zyprexa  5mg  PO QHS  Substance Abuse:  brief intervention provided abstinence advised.  stimulant use: encourage patient to maintain PO intake. Coverage PRN for secondary psychosis.  Severe alcohol use disorder: replacing thiamine . Encourage adequate PO intake. May require lab monitoring of electrolytes. Benzodiazepine taper as tolerated. Seizure precautions. Monitor for delirium tremens.  Cannabis use disorder: Encouraging insight. Education provided on the effect of cannabis on mental health. Coverage for withdrawal symptoms and nausea.  Nicotine  and tobacco use: Nicotine  replacement therapy provided.  Medical: PRNs for pain, constipation, indigestion available.  Labs/studies: sodium is lower. May be dilutional with the mildly elevated blood glucose.  TSH was mildly elevated. Will need recheck as outpatient. TSH is often abnormal with cocaine use Safety and Monitoring: voluntarily  admission to [facility]/Facility based care unit [facility]) unit for safety, stabilization and treatment Daily contact with patient to assess and evaluate symptoms and progress in treatment Patient's case to be discussed in multi-disciplinary team meeting Observation Level : q15 minute checks Vital signs: q12 hours Precautions: withdrawal and seizure   Based on my evaluation the patient does not appear to have an emergency medical condition.   Corean Anette Potters, MD 09/09/2024 3:56 PM

## 2024-09-10 NOTE — Group Note (Signed)
 Group Topic: Relapse and Recovery  Group Date: 09/10/2024 Start Time: 2000 End Time: 2100 Facilitators: Joan Plowman B  Department: [facility]  Number of Participants: 9  Group Focus: abuse issues, activities of daily living skills, chemical dependency education, chemical dependency issues, community group, coping skills, daily focus, diagnosis education, goals/reality orientation, problem solving, relapse prevention, and substance abuse education Treatment Modality:  Psychoeducation and Spiritual Interventions utilized were leisure development, patient education, story telling, and support Purpose: enhance coping skills, explore maladaptive thinking, express feelings, increase insight, relapse prevention strategies, and trigger / craving management  Name: Carlos French Date of Birth: 09-06-1991  MR: [id number]    Level of Participation: active Quality of Participation: cooperative Interactions with others: gave feedback Mood/Affect: appropriate Triggers (if applicable): NA Cognition: coherent/clear Progress: Gaining insight Response: NA Plan: patient will be encouraged to keep going to groups  Patients Problems:  Patient Active Problem List   Diagnosis Date Noted   Hyponatremia 09/09/2024   Methamphetamine abuse (HCC) 09/08/2024   Polysubstance abuse (HCC) 09/07/2024   PTSD (post-traumatic stress disorder) 09/16/2018   MDD (major depressive disorder), recurrent, severe, 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

## 2024-09-10 NOTE — Group Note (Signed)
 Group Topic: Relapse and Recovery  Group Date: 09/10/2024 Start Time: 1200 End Time: 1220 Facilitators: Stanly Stabile, RN  Department: [facility]  Number of Participants: 7  Group Focus: coping skills Treatment Modality:  Behavior Modification Therapy Interventions utilized were patient education Purpose: enhance coping skills, explore maladaptive thinking, increase insight, and regain self-worth  Name: Carlos French Date of Birth: October 11, 1991  MR: [id number]    Level of Participation: moderate Quality of Participation: attentive Interactions with others: gave feedback Mood/Affect: appropriate Triggers (if applicable):   Cognition: coherent/clear and goal directed Progress: Minimal Response:   Plan: follow-up needed  Patients Problems:  Patient Active Problem List   Diagnosis Date Noted   Hyponatremia 09/09/2024   Methamphetamine abuse (HCC) 09/08/2024   Polysubstance abuse (HCC) 09/07/2024   PTSD (post-traumatic stress disorder) 09/16/2018   MDD (major depressive disorder), recurrent, severe, 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

## 2024-09-10 NOTE — ED Notes (Signed)
 Pt is sleeping. No acute distress noted.

## 2024-09-10 NOTE — ED Notes (Signed)
 Patient A&Ox4. Has been calm, cooperative, and appropriate with peers. He denies intent to harm self/others. Denies A/VH. Patient denies withdrawal symptoms, physical pain or discomfort. No acute distress observed. Routine safety checks conducted according to facility protocol. Patient agreed to notify staff should thoughts of harm toward self or others arise. We will continue to monitor for safety.

## 2024-09-10 NOTE — ED Provider Notes (Signed)
 Behavioral Health Progress Note  Date and Time: 09/10/2024 3:47 PM Name: Carlos French MRN:  [id number]  Subjective:  Carlos French was seen today in the common area. After discussion yesterday, he did not call his mother back and has not called her today. He is sleeping and eating okay. He minimizes his substance use today. He feels that he is now doing good and denies withdrawal. He continues to push for discharge but is aware that the taper does not end till tomorrow.   Diagnosis:  Final diagnoses:  Polysubstance (excluding opioids) dependence (HCC)    Total Time spent with patient: 20 minutes   Past Psychiatric History: borderline personality, polysubstance dependence, substance induced mood disorder, depression & psychosis. Has been to [facility]. Has been on remeron , hydroxyzine , rozerem , olanzapine , lithium , trazodone  Past Medical History: see chart Family History: mother with unknown Social History: currently couch surfing. Works as a Scientific laboratory technician. Single. Has one child. Educated to the 10th grade. Denies legal issues. No guns, no military history, no trauma history Substance history: smokes 2 ppd. Alcohol daily, up to 12 beers. Cocaine daily. Recently started methamphetamines. Has history of seizures and withdrawal. Has been to [facility].   Sleep: Good  Appetite:  Good  Current Medications:  Current Facility-Administered Medications  Medication Dose Route Frequency Provider Last Rate Last Admin   alum & mag hydroxide-simeth (MAALOX/MYLANTA) 200-200-20 MG/5ML suspension 30 mL  30 mL Oral Q4H PRN [name], [name], NP   30 mL at 09/09/24 2124   chlordiazePOXIDE  (LIBRIUM ) capsule 25 mg  25 mg Oral BH-qamhs [name], [name], MD   25 mg at 09/10/24 1026   Followed by   [name] ON 09/11/2024] chlordiazePOXIDE  (LIBRIUM ) capsule 25 mg  25 mg Oral Daily [name], [name], MD       haloperidol  (HALDOL ) tablet 5 mg  5 mg Oral TID PRN [name], NP       And   diphenhydrAMINE  (BENADRYL ) capsule 50 mg  50 mg Oral TID PRN [name], NP       haloperidol  lactate (HALDOL ) injection 5 mg  5 mg Intramuscular TID PRN [name], NP       And   diphenhydrAMINE  (BENADRYL ) injection 50 mg  50 mg Intramuscular TID PRN [name], NP       And   LORazepam  (ATIVAN ) injection 2 mg  2 mg Intramuscular TID PRN [name], [name], NP       haloperidol  lactate (HALDOL ) injection 10 mg  10 mg Intramuscular TID PRN [name], NP       And   diphenhydrAMINE  (BENADRYL ) injection 50 mg  50 mg Intramuscular TID PRN [name], NP       And   LORazepam  (ATIVAN ) injection 2 mg  2 mg Intramuscular TID PRN [name], NP       feeding supplement (ENSURE PLUS HIGH PROTEIN) liquid 237 mL  237 mL Oral BID BM [name], [name], MD   237 mL at 09/10/24 1351   hydrOXYzine  (ATARAX ) tablet 25 mg  25 mg Oral TID PRN [name], [name], NP       hydrOXYzine  (ATARAX ) tablet 25 mg  25 mg Oral Q6H PRN [name], [name], NP       loperamide  (IMODIUM ) capsule 2-4 mg  2-4 mg Oral PRN [name], [name], NP       LORazepam  (ATIVAN ) tablet 1 mg  1 mg Oral Q6H PRN  [name], NP       magnesium  hydroxide (MILK OF MAGNESIA) suspension 30 mL  30 mL Oral Daily PRN [name], [name], NP       multivitamin with minerals tablet 1 tablet  1 tablet Oral Daily [name], NP   1 tablet at 09/10/24 1026   nicotine  (NICODERM CQ  - dosed in mg/24 hours) patch 14 mg  14 mg Transdermal Daily [name], [name], MD   14 mg at 09/10/24 1027   nicotine  polacrilex (NICORETTE ) gum 2 mg  2 mg Oral PRN [name], MD   2 mg at 09/10/24 1435   OLANZapine  (ZYPREXA ) tablet 5 mg  5 mg Oral QHS [name], [name], NP   5 mg at 09/09/24 2125   ondansetron  (ZOFRAN -ODT) disintegrating tablet 4 mg  4 mg Oral Q6H PRN [name], [name], NP       thiamine  (VITAMIN B1) tablet 100 mg  100 mg Oral Daily [name], [name], NP    100 mg at 09/10/24 1026   traZODone  (DESYREL ) tablet 50 mg  50 mg Oral QHS PRN [name], [name], NP   50 mg at 09/09/24 2125   Current Outpatient Medications  Medication Sig Dispense Refill   clonazePAM  (KLONOPIN ) 0.5 MG tablet Take 0.5 mg by mouth daily as needed for anxiety.      Labs  Lab Results:  Admission on 09/07/2024  Component Date Value Ref Range Status   Sodium 09/08/2024 132 (L)  135 - 145 mmol/L Final   Potassium 09/08/2024 4.4  3.5 - 5.1 mmol/L Final   Chloride 09/08/2024 91 (L)  98 - 111 mmol/L Final   CO2 09/08/2024 28  22 - 32 mmol/L Final   Glucose, Bld 09/08/2024 158 (H)  70 - 99 mg/dL Final   Glucose reference range applies only to samples taken after fasting for at least 8 hours.   BUN 09/08/2024 16  6 - 20 mg/dL Final   Creatinine, Ser 09/08/2024 0.98  0.61 - 1.24 mg/dL Final   Calcium  09/08/2024 10.3  8.9 - 10.3 mg/dL Final   Total Protein 90/84/7974 8.2 (H)  6.5 - 8.1 g/dL Final   Albumin 90/84/7974 4.7  3.5 - 5.0 g/dL Final   AST 90/84/7974 25  15 - 41 U/L Final   ALT 09/08/2024 17  0 - 44 U/L Final   Alkaline Phosphatase 09/08/2024 74  38 - 126 U/L Final   Total Bilirubin 09/08/2024 1.0  0.0 - 1.2 mg/dL Final   GFR, Estimated 09/08/2024 >60  >60 mL/min Final   Comment: (NOTE) Calculated using the CKD-EPI Creatinine Equation (2021)    Anion gap 09/08/2024 13  5 - 15 Final   Performed at [facility], [address], [place], [place] [address]   RPR Ser Ql 09/08/2024 NON REACTIVE  NON REACTIVE Final   Performed at [facility], [address] [place], [place] [address]   Hepatitis B Surface Ag 09/08/2024 NON REACTIVE  NON REACTIVE Final   HCV Ab 09/08/2024 NON REACTIVE  NON REACTIVE Final   Comment: (NOTE) Nonreactive HCV antibody screen is consistent with no HCV infections,  unless recent infection is suspected or other evidence exists to indicate HCV infection.     Hep A IgM 09/08/2024 NON REACTIVE  NON REACTIVE Final   Hep B C IgM  09/08/2024 NON REACTIVE  NON REACTIVE Final   Performed at [facility], [address] [place], [place] [address]  TSH 09/08/2024 5.382 (H)  0.350 - 4.500 uIU/mL Final   Comment: Performed by a 3rd Generation assay with a functional sensitivity of <=0.01 uIU/mL. Performed at [facility], [address] [place], [place] [address]   Admission on 09/07/2024, Discharged on 09/07/2024  Component Date Value Ref Range Status   Sodium 09/07/2024 134 (L)  135 - 145 mmol/L Final   Potassium 09/07/2024 4.2  3.5 - 5.1 mmol/L Final   Chloride 09/07/2024 100  98 - 111 mmol/L Final   CO2 09/07/2024 20 (L)  22 - 32 mmol/L Final   Glucose, Bld 09/07/2024 87  70 - 99 mg/dL Final   Glucose reference range applies only to samples taken after fasting for at least 8 hours.   BUN 09/07/2024 6  6 - 20 mg/dL Final   Creatinine, Ser 09/07/2024 0.72  0.61 - 1.24 mg/dL Final   Calcium  09/07/2024 9.1  8.9 - 10.3 mg/dL Final   Total Protein 90/85/7974 7.8  6.5 - 8.1 g/dL Final   Albumin 90/85/7974 4.4  3.5 - 5.0 g/dL Final   AST 90/85/7974 22  15 - 41 U/L Final   ALT 09/07/2024 14  0 - 44 U/L Final   Alkaline Phosphatase 09/07/2024 67  38 - 126 U/L Final   Total Bilirubin 09/07/2024 0.7  0.0 - 1.2 mg/dL Final   GFR, Estimated 09/07/2024 >60  >60 mL/min Final   Comment: (NOTE) Calculated using the CKD-EPI Creatinine Equation (2021)    Anion gap 09/07/2024 14  5 - 15 Final   Performed at [facility] Lab, [address], [place], [place] [address]   Alcohol, Ethyl (B) 09/07/2024 <15  <15 mg/dL Final   Comment: (NOTE) For medical purposes only. Performed at [facility], [address], [place], [place] [address]    WBC 09/07/2024 6.4  4.0 - 10.5 K/uL Final   RBC 09/07/2024 4.78  4.22 - 5.81 MIL/uL Final   Hemoglobin 09/07/2024 15.5  13.0 - 17.0 g/dL Final   HCT 90/85/7974 46.5  39.0 - 52.0 % Final   MCV 09/07/2024 97.3  80.0 - 100.0 fL Final   MCH 09/07/2024 32.4  26.0 - 34.0 pg  Final   MCHC 09/07/2024 33.3  30.0 - 36.0 g/dL Final   RDW 90/85/7974 13.2  11.5 - 15.5 % Final   Platelets 09/07/2024 266  150 - 400 K/uL Final   nRBC 09/07/2024 0.0  0.0 - 0.2 % Final   Performed at [facility], [address], [place], [place] [address]   Opiates 09/07/2024 NONE DETECTED  NONE DETECTED Final   Cocaine 09/07/2024 POSITIVE (A)  NONE DETECTED Final   Benzodiazepines 09/07/2024 NONE DETECTED  NONE DETECTED Final   Amphetamines 09/07/2024 POSITIVE (A)  NONE DETECTED Final   Comment: (NOTE) Trazodone  is metabolized in vivo to several metabolites, including pharmacologically active m-CPP, which is excreted in the urine. Immunoassay screens for amphetamines and MDMA have potential cross-reactivity with these compounds and may provide false positive  results.     Tetrahydrocannabinol 09/07/2024 POSITIVE (A)  NONE DETECTED Final   Barbiturates 09/07/2024 NONE DETECTED  NONE DETECTED Final   Comment: (NOTE) DRUG SCREEN FOR MEDICAL PURPOSES ONLY.  IF CONFIRMATION IS NEEDED FOR ANY PURPOSE, NOTIFY LAB WITHIN 5 DAYS.  LOWEST DETECTABLE LIMITS FOR URINE DRUG SCREEN Drug Class                     Cutoff (ng/mL) Amphetamine and metabolites  1000 Barbiturate and metabolites    200 Benzodiazepine                 200 Opiates and metabolites        300 Cocaine and metabolites        300 THC                            50 Performed at [facility] Lab, [address], [place], [place] [address]     Blood Alcohol level:  Lab Results  Component Value Date   ETH <15 09/07/2024   ETH 171 (H) 04/12/2020    Metabolic Disorder Labs: Lab Results  Component Value Date   HGBA1C 4.6 02/10/2016   Lab Results  Component Value Date   PROLACTIN 32.3 (H) 02/10/2016   Lab Results  Component Value Date   CHOL 209 (H) 02/10/2016   TRIG 120 02/10/2016   HDL 86 02/10/2016   CHOLHDL 2.4 02/10/2016   VLDL 24 02/10/2016   LDLCALC 99 02/10/2016   LDLCALC 100 (H)  08/12/2015    Therapeutic Lab Levels: No results found for: LITHIUM  No results found for: VALPROATE No results found for: CBMZ  Physical Findings   AIMS    Flowsheet Row [facility]) [facility] ([facility] Ad[facility] ([facility]) [facility] Ad[facility] (Discharged) from 02[facility]09/2016 in [facility]  AIMS Total Score 0 0 0 0 0   AUDIT    Flowsheet Row [facility]) [facility] Ad[facility] (Discharged[facility] Ad[facility] (Discharged[facility] Ad[facility] (Discharged) [facility] Admission (Discharged) from 02/09/2016 in [facility]PAT[facility]INE  Alcohol Use Disorder Identification Test Final Score (AUDIT) 35 34 35 40 31   Flowsheet Row ED from 09/07/2024 in [facility] Department at [facility] ED from 11/12/2023 in [facility] Department at [facility] from 05/18/2022 in [facility] at [facility])  C-SSRS RISK CATEGORY High Risk No Risk No Risk     Musculoskeletal  Strength & Muscle Tone: within normal limits Gait & Station: normal Patient leans: N/A  Psychiatric Specialty Exam  Presentation  General Appearance:  Casual  Eye Contact: Good  Speech: Normal Rate  Speech Volume: Normal  Handedness: Right   Mood and Affect  Mood: Euthymic  Affect: Congruent   Thought Process  Thought Processes: Goal Directed  Descriptions of Associations:Intact  Orientation:Full (Time, Place  and Person)  Thought Content:Logical     Hallucinations:Hallucinations: None  Ideas of Reference:None  Suicidal Thoughts:Suicidal Thoughts: No  Homicidal Thoughts:Homicidal Thoughts: No   Sensorium  Memory: Immediate Fair; Recent Fair  Judgment: Poor  Insight: Poor   Executive Functions  Concentration: Fair  Attention Span: Fair  Recall: Fair  Fund of Knowledge: Good  Language: Good   Psychomotor Activity  Psychomotor Activity: Psychomotor Activity: Normal   Assets  Assets: Communication Skills   Sleep  Sleep: Sleep: Good  Estimated Sleeping Duration (Last 24 Hours): 7.00-8.75 hours  No data recorded  Physical Exam  Physical Exam Vitals and nursing note reviewed.  HENT:  Head: Normocephalic and atraumatic.  Eyes:     Extraocular Movements: Extraocular movements intact.  Pulmonary:     Effort: Pulmonary effort is normal.  Musculoskeletal:        General: Normal range of motion.     Cervical back: Normal range of motion.  Neurological:     General: No focal deficit present.     Mental Status: He is alert and oriented to person, place, and time.  Psychiatric:        Behavior: Behavior normal.    Review of Systems  Constitutional:  Negative for diaphoresis.  Gastrointestinal:  Negative for constipation, diarrhea, nausea and vomiting.  Genitourinary:  Negative for dysuria.  Musculoskeletal:  Negative for joint pain and myalgias.  Neurological:  Negative for tremors.  Psychiatric/Behavioral:  Negative for hallucinations and suicidal ideas.    Blood pressure 104/87, pulse 86, temperature 97.8 F (36.6 C), temperature source Oral, resp. rate 18, SpO2 98%. There is no height or weight on file to calculate BMI.  Treatment Plan Summary: Long Term Goals: Improvement in symptoms so as ready for discharge   Short Term Goals: Patient will verbalize feelings in meetings with treatment team members., Patient will attend at least of 50% of  the groups daily., Pt will complete the PHQ9 on admission, day 3 and discharge., and Patient will take medications as prescribed daily.   Medications: Mood/anxiety: continue group therapy, milieu therapy, 1:1 evaluation with provider.  Medication management: zyprexa  5mg  PO QHS  Substance Abuse:  brief intervention provided abstinence advised.  stimulant use: encourage patient to maintain PO intake. Coverage PRN for secondary psychosis.  Severe alcohol use disorder: replacing thiamine . Encourage adequate PO intake. May require lab monitoring of electrolytes. Benzodiazepine taper as tolerated. Seizure precautions. Monitor for delirium tremens.  Cannabis use disorder: Encouraging insight. Education provided on the effect of cannabis on mental health. Coverage for withdrawal symptoms and nausea.  Nicotine  and tobacco use: Nicotine  replacement therapy provided.  Medical: PRNs for pain, constipation, indigestion available.  Labs/studies: sodium is lower. May be dilutional with the mildly elevated blood glucose.  TSH was mildly elevated. Will need recheck as outpatient. TSH is often abnormal with cocaine use Safety and Monitoring: voluntarily admission to [facility]/Facility based care unit [facility]) unit for safety, stabilization and treatment Daily contact with patient to assess and evaluate symptoms and progress in treatment Patient's case to be discussed in multi-disciplinary team meeting Observation Level : q15 minute checks Vital signs: q12 hours Precautions: withdrawal and seizure   Based on my evaluation the patient does not appear to have an emergency medical condition.   Corean Anette Potters, MD 09/10/2024 3:47 PM

## 2024-09-11 MED ORDER — NICOTINE POLACRILEX 2 MG MT GUM: 2.0000 mg | CHEWING_GUM | OROMUCOSAL | 0 refills | Status: DC | PRN

## 2024-09-11 MED ORDER — STRESS FORMULA/ZINC PO TABS: 1.0000 | ORAL_TABLET | Freq: Every day | ORAL | 0 refills | Status: DC

## 2024-09-11 MED ORDER — OLANZAPINE 5 MG PO TABS: 5.0000 mg | ORAL_TABLET | Freq: Every day | ORAL | 0 refills | Status: DC

## 2024-09-11 MED ORDER — HYDROXYZINE HCL 25 MG PO TABS: 25.0000 mg | ORAL_TABLET | Freq: Three times a day (TID) | ORAL | 0 refills | Status: DC | PRN

## 2024-09-11 NOTE — ED Notes (Signed)
 Patient awake and alert on unit.  He ate breakfast and was social with peers.  Patient is showering now.  He is focused on discharge and M.D and Ava are aware.  He denies avh shi or plan.  Will monitor and provide a safe environment.

## 2024-09-11 NOTE — ED Provider Notes (Signed)
 FBC/OBS ASAP Discharge Summary  Date and Time: 09/11/2024 11:10 AM  Name: Carlos French  MRN:  [id number]   Discharge Diagnoses:  Final diagnoses:  Polysubstance (excluding opioids) dependence (HCC)    Subjective: patient seen on day of discharge. He was invited to stay and seek residential rehab placement but he declined. He endorsed intent to return to previous living situation and continue with outpatient follow up. He was convincingly able to deny hallucinations, paranoia, delusions, thoughts of harm to self or others.   Stay Summary: During the course of patient's hospitalization, the 15-minute checks were adequate to ensure patient's safety. Patient did not exhibit erratic or aggressive behavior and was compliant with scheduled medication. Patient was recommended for outpatient psychiatry follow-up.  At the time of discharge patient is not reporting any acute suicidal/homicidal ideations/AVH, delusional thoughts or paranoia. Patient did not appear to be responding to any internal stimuli. Patient feels more confident about self-care & in managing their mental health problems. Patient currently denies any new issues or concerns. Education and supportive counseling provided throughout patient's hospital stay & upon discharge.  patient was safely detoxed fom stimulants and cannabis during the course of his stay, and any symptoms of withdrawal were addressed and have since resolved. At time of discharge the patient is not experiencing clinical syptoms of withdrawal and denies having any subjective symptoms. At time of discharge CIWA was 0.   Today upon discharge evaluation, the patient gives a mood of appropriate to circumstances. Patient denies any specific concerns and has no new physical complaints. Patient slept well, appetite good, regular bowel movements. Patient feels that the medications have been helpful & is in agreement to continue current treatment regimen as recommended. Patient was  able to engage in safety planning including plan to return to [facility] the nearest emergency room or contact emergency services if patient feels unable to maintain their own safety or the safety of others. Patient had no further questions, comments, or concerns. Patient left [facility]) with all personal belongings in no apparent distress. Transportation per safe transport to home was arranged for patient.  Total Time spent with patient: 20 minutes  Past Psychiatric History: borderline personality, polysubstance dependence, substance induced mood disorder, depression & psychosis. Has been to [facility]. Has been on remeron , hydroxyzine , rozerem , olanzapine , lithium , trazodone  Past Medical History: see chart Family History: mother with unknown Social History: currently couch surfing. Works as a Scientific laboratory technician. Single. Has one child. Educated to the 10th grade. Denies legal issues. No guns, no military history, no trauma history Substance history: smokes 2 ppd. Alcohol daily, up to 12 beers. Cocaine daily. Recently started methamphetamines. Has history of seizures and withdrawal. Has been to [facility].  Tobacco Cessation:  A prescription for an FDA-approved tobacco cessation medication provided at discharge  Current Medications:  Current Facility-Administered Medications  Medication Dose Route Frequency Provider Last Rate Last Admin   alum & mag hydroxide-simeth (MAALOX/MYLANTA) 200-200-20 MG/5ML suspension 30 mL  30 mL Oral Q4H PRN [name], [name], NP   30 mL at 09/09/24 2124   haloperidol  (HALDOL ) tablet 5 mg  5 mg Oral TID PRN [name], NP       And   diphenhydrAMINE  (BENADRYL ) capsule 50 mg  50 mg Oral TID PRN [name], NP       haloperidol  lactate (HALDOL ) injection 5 mg  5 mg Intramuscular TID PRN [name], NP  And   diphenhydrAMINE  (BENADRYL ) injection 50 mg  50 mg  Intramuscular TID PRN [name], NP       And   LORazepam  (ATIVAN ) injection 2 mg  2 mg Intramuscular TID PRN [name], [name], NP       haloperidol  lactate (HALDOL ) injection 10 mg  10 mg Intramuscular TID PRN [name], NP       And   diphenhydrAMINE  (BENADRYL ) injection 50 mg  50 mg Intramuscular TID PRN [name], NP       And   LORazepam  (ATIVAN ) injection 2 mg  2 mg Intramuscular TID PRN [name], NP       feeding supplement (ENSURE PLUS HIGH PROTEIN) liquid 237 mL  237 mL Oral BID BM Mackinsey Pelland, [name], MD   237 mL at 09/10/24 1351   hydrOXYzine  (ATARAX ) tablet 25 mg  25 mg Oral TID PRN [name], [name], NP       magnesium  hydroxide (MILK OF MAGNESIA) suspension 30 mL  30 mL Oral Daily PRN [name], [name], NP       multivitamin with minerals tablet 1 tablet  1 tablet Oral Daily [name], NP   1 tablet at 09/11/24 0912   nicotine  (NICODERM CQ  - dosed in mg/24 hours) patch 14 mg  14 mg Transdermal Daily Nanna Ertle, [name], MD   14 mg at 09/10/24 1027   nicotine  polacrilex (NICORETTE ) gum 2 mg  2 mg Oral PRN [name], MD   2 mg at 09/10/24 2110   OLANZapine  (ZYPREXA ) tablet 5 mg  5 mg Oral QHS [name], [name], NP   5 mg at 09/10/24 2109   thiamine  (VITAMIN B1) tablet 100 mg  100 mg Oral Daily [name], [name], NP   100 mg at 09/11/24 9087   traZODone  (DESYREL ) tablet 50 mg  50 mg Oral QHS PRN [name], [name], NP   50 mg at 09/09/24 2125   Current Outpatient Medications  Medication Sig Dispense Refill   Multiple Vitamins-Minerals (B COMPLEX-C-E-ZINC ) tablet Take 1 tablet by mouth daily. 30 tablet 0   hydrOXYzine  (ATARAX ) 25 MG tablet Take 1 tablet (25 mg total) by mouth 3 (three) times daily as needed for anxiety. 30 tablet 0   nicotine  polacrilex (NICORETTE ) 2 MG gum Take 1 each (2 mg total) by mouth as needed for smoking cessation. 100 tablet 0   OLANZapine  (ZYPREXA ) 5 MG tablet Take 1 tablet (5 mg total) by  mouth at bedtime. 30 tablet 0    PTA Medications:  PTA Medications  Medication Sig   Multiple Vitamins-Minerals (B COMPLEX-C-E-ZINC ) tablet Take 1 tablet by mouth daily.   nicotine  polacrilex (NICORETTE ) 2 MG gum Take 1 each (2 mg total) by mouth as needed for smoking cessation.   hydrOXYzine  (ATARAX ) 25 MG tablet Take 1 tablet (25 mg total) by mouth 3 (three) times daily as needed for anxiety.   OLANZapine  (ZYPREXA ) 5 MG tablet Take 1 tablet (5 mg total) by mouth at bedtime.   Facility Ordered Medications  Medication   hydrOXYzine  (ATARAX ) tablet 25 mg   OLANZapine  (ZYPREXA ) tablet 5 mg   alum & mag hydroxide-simeth (MAALOX/MYLANTA) 200-200-20 MG/5ML suspension 30 mL   magnesium  hydroxide (MILK OF MAGNESIA) suspension 30 mL   haloperidol  (HALDOL ) tablet 5 mg   And   diphenhydrAMINE  (BENADRYL ) capsule 50 mg   haloperidol  lactate (HALDOL ) injection 5 mg   And   diphenhydrAMINE  (BENADRYL ) injection 50 mg  And   LORazepam  (ATIVAN ) injection 2 mg   haloperidol  lactate (HALDOL ) injection 10 mg   And   diphenhydrAMINE  (BENADRYL ) injection 50 mg   And   LORazepam  (ATIVAN ) injection 2 mg   traZODone  (DESYREL ) tablet 50 mg   thiamine  (VITAMIN B1) tablet 100 mg   multivitamin with minerals tablet 1 tablet   [EXPIRED] LORazepam  (ATIVAN ) tablet 1 mg   [EXPIRED] hydrOXYzine  (ATARAX ) tablet 25 mg   [EXPIRED] loperamide  (IMODIUM ) capsule 2-4 mg   [EXPIRED] ondansetron  (ZOFRAN -ODT) disintegrating tablet 4 mg   [COMPLETED] chlordiazePOXIDE  (LIBRIUM ) capsule 25 mg   Followed by   [COMPLETED] chlordiazePOXIDE  (LIBRIUM ) capsule 25 mg   Followed by   [COMPLETED] chlordiazePOXIDE  (LIBRIUM ) capsule 25 mg   Followed by   [COMPLETED] chlordiazePOXIDE  (LIBRIUM ) capsule 25 mg   nicotine  polacrilex (NICORETTE ) gum 2 mg   nicotine  (NICODERM CQ  - dosed in mg/24 hours) patch 14 mg   feeding supplement (ENSURE PLUS HIGH PROTEIN) liquid 237 mL        No data to display          Flowsheet Row  ED from 09/07/2024 in [facility] Emergency Department at [facility] ED from 11/12/2023 in [facility] Department at [facility] UC from 05/18/2022 in [facility] at [facility])  C-SSRS RISK CATEGORY High Risk No Risk No Risk    Musculoskeletal  Strength & Muscle Tone: within normal limits Gait & Station: normal Patient leans: N/A  Psychiatric Specialty Exam  Presentation  General Appearance:  Casual  Eye Contact: Good  Speech: Normal Rate  Speech Volume: Normal  Handedness: Right   Mood and Affect  Mood: Euthymic  Affect: Congruent   Thought Process  Thought Processes: Goal Directed  Descriptions of Associations:Intact  Orientation:Full (Time, Place and Person)  Thought Content:Logical     Hallucinations:Hallucinations: None  Ideas of Reference:None  Suicidal Thoughts:Suicidal Thoughts: No  Homicidal Thoughts:Homicidal Thoughts: No   Sensorium  Memory: Immediate Fair; Recent Fair  Judgment: Poor  Insight: Poor   Executive Functions  Concentration: Fair  Attention Span: Fair  Recall: Fair  Fund of Knowledge: Good  Language: Good   Psychomotor Activity  Psychomotor Activity: Psychomotor Activity: Normal   Assets  Assets: Communication Skills   Sleep  Sleep: Sleep: Good  Estimated Sleeping Duration (Last 24 Hours): 7.75-10.00 hours  No data recorded  Physical Exam  Physical Exam Vitals and nursing note reviewed.  Constitutional:      Appearance: Normal appearance.  HENT:     Head: Normocephalic and atraumatic.  Eyes:     Extraocular Movements: Extraocular movements intact.  Pulmonary:     Effort: Pulmonary effort is normal.  Musculoskeletal:        General: Normal range of motion.     Cervical back: Normal range of motion.  Neurological:     General: No focal deficit present.     Mental Status: He is alert and oriented to person, place, and time.   Psychiatric:        Behavior: Behavior normal.    Review of Systems  Constitutional:  Negative for diaphoresis and fever.  Respiratory:  Negative for cough and shortness of breath.   Cardiovascular:  Negative for chest pain.  Gastrointestinal:  Negative for constipation, diarrhea, nausea and vomiting.  Genitourinary:  Negative for dysuria.  Musculoskeletal:  Negative for joint pain and myalgias.  Skin:  Negative for rash.  Neurological:  Negative for tremors.  Psychiatric/Behavioral:  Negative for  hallucinations and suicidal ideas.    Blood pressure 109/71, pulse 93, temperature 98 F (36.7 C), resp. rate 18, SpO2 100%. There is no height or weight on file to calculate BMI.  Demographic Factors:  Male, Caucasian, and Low socioeconomic status  Loss Factors: NA  Historical Factors: Family history of mental illness or substance abuse and Impulsivity  Risk Reduction Factors:   Employed and Positive social support  Continued Clinical Symptoms:  Alcohol/Substance Abuse/Dependencies  Cognitive Features That Contribute To Risk:  Polarized thinking    Suicide Risk:  Minimal: No identifiable suicidal ideation.  Patients presenting with no risk factors but with morbid ruminations; may be classified as minimal risk based on the severity of the depressive symptoms  Plan Of Care/Follow-up recommendations:  Activity:  as tolerated Diet:  regular  Disposition: to home with follow up  Corean Anette Potters, MD 09/11/2024, 11:10 AM

## 2024-09-11 NOTE — ED Notes (Signed)
 Pt is sleeping, no acute distress noted. Q15 safety checks in place.

## 2024-09-15 ENCOUNTER — Ambulatory Visit (HOSPITAL_COMMUNITY): Payer: MEDICAID

## 2024-09-15 ENCOUNTER — Telehealth (HOSPITAL_COMMUNITY): Payer: Self-pay | Admitting: Licensed Clinical Social Worker

## 2024-09-15 NOTE — Telephone Encounter (Signed)
 The therapist calls Carlos French after he no shows for his appointment today. He confirms that he is Carlos French at the beginning of the call and says that he would have to call back to reschedule after his child gets home from school so is given the direct contact number for this therapist and Ms. Darice Simpler, LMFT, LCAS. When the therapist attempts to confirm his identity prior to providing more information about the program, he identifies himself as Carlos French and then says his phone is about to die and the call ends.   After the call ends, the therapist confirms he called the Mobile number for the correct patient.   Zell Maier, MA, LCSW, [facility] LCAS 09/15/2024

## 2024-11-24 ENCOUNTER — Other Ambulatory Visit: Payer: Self-pay

## 2024-11-24 ENCOUNTER — Encounter (HOSPITAL_COMMUNITY): Payer: Self-pay

## 2024-11-24 ENCOUNTER — Inpatient Hospital Stay (HOSPITAL_COMMUNITY)
Admission: AD | Admit: 2024-11-24 | Discharge: 2024-11-29 | DRG: 885 | Disposition: A | Payer: MEDICAID | Source: Intra-hospital

## 2024-11-24 ENCOUNTER — Ambulatory Visit (HOSPITAL_COMMUNITY)
Admission: EM | Admit: 2024-11-24 | Discharge: 2024-11-24 | Disposition: A | Discharge status: Other Acute Inpatient Hospital | Payer: MEDICAID | Attending: Psychiatry | Admitting: Psychiatry

## 2024-11-24 DIAGNOSIS — F1020 Alcohol dependence, uncomplicated: Secondary | ICD-10-CM | POA: Diagnosis present

## 2024-11-24 DIAGNOSIS — F1090 Alcohol use, unspecified, uncomplicated: Secondary | ICD-10-CM

## 2024-11-24 DIAGNOSIS — F331 Major depressive disorder, recurrent, moderate: Secondary | ICD-10-CM | POA: Diagnosis not present

## 2024-11-24 DIAGNOSIS — F19959 Other psychoactive substance use, unspecified with psychoactive substance-induced psychotic disorder, unspecified: Principal | ICD-10-CM | POA: Diagnosis present

## 2024-11-24 DIAGNOSIS — F1994 Other psychoactive substance use, unspecified with psychoactive substance-induced mood disorder: Secondary | ICD-10-CM

## 2024-11-24 DIAGNOSIS — F4310 Post-traumatic stress disorder, unspecified: Secondary | ICD-10-CM | POA: Diagnosis present

## 2024-11-24 DIAGNOSIS — F1910 Other psychoactive substance abuse, uncomplicated: Secondary | ICD-10-CM | POA: Diagnosis present

## 2024-11-24 DIAGNOSIS — F1990 Other psychoactive substance use, unspecified, uncomplicated: Secondary | ICD-10-CM | POA: Diagnosis not present

## 2024-11-24 DIAGNOSIS — F17200 Nicotine dependence, unspecified, uncomplicated: Secondary | ICD-10-CM | POA: Diagnosis present

## 2024-11-24 DIAGNOSIS — R45851 Suicidal ideations: Secondary | ICD-10-CM

## 2024-11-24 DIAGNOSIS — F1410 Cocaine abuse, uncomplicated: Secondary | ICD-10-CM

## 2024-11-24 LAB — LIPID PANEL
Cholesterol: 183 mg/dL (ref 0–200)
HDL: 74 mg/dL (ref >40)
LDL Cholesterol: 91 mg/dL (ref 0–99)
Total CHOL/HDL Ratio: 2.5 ratio
Triglycerides: 91 mg/dL (ref <150)
VLDL: 18 mg/dL (ref 0–40)

## 2024-11-24 LAB — CBC WITH DIFFERENTIAL/PLATELET
Abs Immature Granulocytes: 0.03 K/uL (ref 0.00–0.07)
Basophils Absolute: 0.1 K/uL (ref 0.0–0.1)
Basophils Relative: 1 %
Eosinophils Absolute: 0.2 K/uL (ref 0.0–0.5)
Eosinophils Relative: 3 %
HCT: 45.4 % (ref 39.0–52.0)
Hemoglobin: 15.8 g/dL (ref 13.0–17.0)
Immature Granulocytes: 0 %
Lymphocytes Relative: 27 %
Lymphs Abs: 2.1 K/uL (ref 0.7–4.0)
MCH: 33.1 pg (ref 26.0–34.0)
MCHC: 34.8 g/dL (ref 30.0–36.0)
MCV: 95 fL (ref 80.0–100.0)
Monocytes Absolute: 0.8 K/uL (ref 0.1–1.0)
Monocytes Relative: 10 %
Neutro Abs: 4.6 K/uL (ref 1.7–7.7)
Neutrophils Relative %: 59 %
Platelets: 299 K/uL (ref 150–400)
RBC: 4.78 MIL/uL (ref 4.22–5.81)
RDW: 12.3 % (ref 11.5–15.5)
WBC: 7.8 K/uL (ref 4.0–10.5)
nRBC: 0 % (ref 0.0–0.2)

## 2024-11-24 LAB — COMPREHENSIVE METABOLIC PANEL WITH GFR
ALT: 13 U/L (ref 0–44)
AST: 21 U/L (ref 15–41)
Albumin: 4.5 g/dL (ref 3.5–5.0)
Alkaline Phosphatase: 68 U/L (ref 38–126)
Anion gap: 13 (ref 5–15)
BUN: 9 mg/dL (ref 6–20)
CO2: 26 mmol/L (ref 22–32)
Calcium: 9.2 mg/dL (ref 8.9–10.3)
Chloride: 96 mmol/L — ABNORMAL LOW (ref 98–111)
Creatinine, Ser: 0.82 mg/dL (ref 0.61–1.24)
GFR, Estimated: >60 mL/min (ref >60)
Glucose, Bld: 67 mg/dL — ABNORMAL LOW (ref 70–99)
Potassium: 3.2 mmol/L — ABNORMAL LOW (ref 3.5–5.1)
Sodium: 135 mmol/L (ref 135–145)
Total Bilirubin: 0.6 mg/dL (ref 0.0–1.2)
Total Protein: 7.8 g/dL (ref 6.5–8.1)

## 2024-11-24 LAB — ETHANOL: Alcohol, Ethyl (B): 94 mg/dL — ABNORMAL HIGH (ref <15)

## 2024-11-24 LAB — HEMOGLOBIN A1C
Hgb A1c MFr Bld: 5.1 % (ref 4.8–5.6)
Mean Plasma Glucose: 100 mg/dL

## 2024-11-24 LAB — POCT URINE DRUG SCREEN - MANUAL ENTRY (I-SCREEN)
POC Amphetamine UR: NOT DETECTED
POC Buprenorphine (BUP): NOT DETECTED
POC Cocaine UR: POSITIVE — AB
POC Marijuana UR: POSITIVE — AB
POC Methadone UR: NOT DETECTED
POC Methamphetamine UR: NOT DETECTED
POC Morphine: NOT DETECTED
POC Oxazepam (BZO): NOT DETECTED
POC Oxycodone UR: NOT DETECTED
POC Secobarbital (BAR): NOT DETECTED

## 2024-11-24 LAB — TSH: TSH: 1.304 u[IU]/mL (ref 0.350–4.500)

## 2024-11-24 MED ORDER — HYDROXYZINE HCL 25 MG PO TABS
25.0000 mg | ORAL_TABLET | Freq: Three times a day (TID) | ORAL | Status: DC | PRN
  Administered 2024-11-24: 25 mg via ORAL
  Filled 2024-11-24: qty 1

## 2024-11-24 MED ORDER — HALOPERIDOL 5 MG PO TABS: 5.0000 mg | ORAL_TABLET | Freq: Three times a day (TID) | ORAL | Status: DC | PRN

## 2024-11-24 MED ORDER — DIPHENHYDRAMINE HCL 25 MG PO CAPS: 50.0000 mg | ORAL_CAPSULE | Freq: Three times a day (TID) | ORAL | Status: DC | PRN

## 2024-11-24 MED ORDER — HALOPERIDOL LACTATE 5 MG/ML IJ SOLN: 10.0000 mg | Freq: Three times a day (TID) | INTRAMUSCULAR | Status: DC | PRN

## 2024-11-24 MED ORDER — TRAZODONE HCL 50 MG PO TABS
50.0000 mg | ORAL_TABLET | Freq: Every evening | ORAL | Status: DC | PRN
  Administered 2024-11-24: 50 mg via ORAL
  Filled 2024-11-24: qty 1

## 2024-11-24 MED ORDER — LORAZEPAM 1 MG PO TABS: 1.0000 mg | ORAL_TABLET | Freq: Four times a day (QID) | ORAL | Status: DC | PRN

## 2024-11-24 MED ORDER — HYDROXYZINE HCL 25 MG PO TABS: 25.0000 mg | ORAL_TABLET | Freq: Four times a day (QID) | ORAL | Status: DC | PRN

## 2024-11-24 MED ORDER — LORAZEPAM 2 MG/ML IJ SOLN: 2.0000 mg | Freq: Three times a day (TID) | INTRAMUSCULAR | Status: DC | PRN

## 2024-11-24 MED ORDER — LOPERAMIDE HCL 2 MG PO CAPS: 2.0000 mg | ORAL_CAPSULE | ORAL | Status: DC | PRN

## 2024-11-24 MED ORDER — HALOPERIDOL LACTATE 5 MG/ML IJ SOLN: 5.0000 mg | Freq: Three times a day (TID) | INTRAMUSCULAR | Status: DC | PRN

## 2024-11-24 MED ORDER — THIAMINE MONONITRATE 100 MG PO TABS: 100.0000 mg | ORAL_TABLET | Freq: Every day | ORAL | Status: DC

## 2024-11-24 MED ORDER — ADULT MULTIVITAMIN W/MINERALS CH
1.0000 | ORAL_TABLET | Freq: Every day | ORAL | Status: DC
  Administered 2024-11-24: 1 via ORAL
  Filled 2024-11-24: qty 1

## 2024-11-24 MED ORDER — DIPHENHYDRAMINE HCL 50 MG/ML IJ SOLN: 50.0000 mg | Freq: Three times a day (TID) | INTRAMUSCULAR | Status: DC | PRN

## 2024-11-24 MED ORDER — MAGNESIUM HYDROXIDE 400 MG/5ML PO SUSP: 30.0000 mL | Freq: Every day | ORAL | Status: DC | PRN

## 2024-11-24 MED ORDER — VITAMIN B-1 100 MG PO TABS
100.0000 mg | ORAL_TABLET | Freq: Every day | ORAL | Status: DC
  Administered 2024-11-25 – 2024-11-29 (×5): 100 mg via ORAL
  Filled 2024-11-24 (×5): qty 1

## 2024-11-24 MED ORDER — ONDANSETRON 4 MG PO TBDP: 4.0000 mg | ORAL_TABLET | Freq: Four times a day (QID) | ORAL | Status: AC | PRN

## 2024-11-24 MED ORDER — ALUM & MAG HYDROXIDE-SIMETH 200-200-20 MG/5ML PO SUSP
30.0000 mL | ORAL | Status: DC | PRN
  Filled 2024-11-24: qty 30

## 2024-11-24 MED ORDER — THIAMINE HCL 100 MG/ML IJ SOLN
100.0000 mg | Freq: Once | INTRAMUSCULAR | Status: AC
  Administered 2024-11-24: 100 mg via INTRAMUSCULAR
  Filled 2024-11-24: qty 2

## 2024-11-24 MED ORDER — TRAZODONE HCL 50 MG PO TABS
50.0000 mg | ORAL_TABLET | Freq: Every evening | ORAL | Status: DC | PRN
  Administered 2024-11-24 – 2024-11-28 (×5): 50 mg via ORAL
  Filled 2024-11-24 (×2): qty 1
  Filled 2024-11-24: qty 7
  Filled 2024-11-24 (×3): qty 1

## 2024-11-24 MED ORDER — LORAZEPAM 1 MG PO TABS: 1.0000 mg | ORAL_TABLET | Freq: Four times a day (QID) | ORAL | Status: AC | PRN

## 2024-11-24 MED ORDER — PNEUMOCOCCAL 20-VAL CONJ VACC 0.5 ML IM SUSY
0.5000 mL | PREFILLED_SYRINGE | INTRAMUSCULAR | Status: DC
  Filled 2024-11-24: qty 0.5

## 2024-11-24 MED ORDER — ONDANSETRON 4 MG PO TBDP: 4.0000 mg | ORAL_TABLET | Freq: Four times a day (QID) | ORAL | Status: DC | PRN

## 2024-11-24 MED ORDER — ALUM & MAG HYDROXIDE-SIMETH 200-200-20 MG/5ML PO SUSP
30.0000 mL | ORAL | Status: DC | PRN
  Administered 2024-11-28: 30 mL via ORAL

## 2024-11-24 MED ORDER — INFLUENZA VIRUS VACC SPLIT PF (FLUZONE) 0.5 ML IM SUSY
0.5000 mL | PREFILLED_SYRINGE | INTRAMUSCULAR | Status: DC
  Filled 2024-11-24: qty 0.5

## 2024-11-24 MED ORDER — ALUM & MAG HYDROXIDE-SIMETH 200-200-20 MG/5ML PO SUSP: 30.0000 mL | ORAL | Status: DC | PRN

## 2024-11-24 MED ORDER — ADULT MULTIVITAMIN W/MINERALS CH
1.0000 | ORAL_TABLET | Freq: Every day | ORAL | Status: DC
  Administered 2024-11-25 – 2024-11-29 (×5): 1 via ORAL
  Filled 2024-11-24 (×5): qty 1

## 2024-11-24 MED ORDER — LOPERAMIDE HCL 2 MG PO CAPS: 2.0000 mg | ORAL_CAPSULE | ORAL | Status: AC | PRN

## 2024-11-24 MED ORDER — DIPHENHYDRAMINE HCL 50 MG PO CAPS: 50.0000 mg | ORAL_CAPSULE | Freq: Three times a day (TID) | ORAL | Status: DC | PRN

## 2024-11-24 NOTE — Progress Notes (Signed)
 Patient is currently resting with eyes closed at this time.  No acute distress noted.  Respirations present, even and unlabored.  No issues noted at this time.  Will continue Q 15 min safety checks for safety/behavior per facility protocol.

## 2024-11-24 NOTE — Tx Team (Signed)
 Initial Treatment Plan 11/24/2024 12:10 PM Carlos French [id number]    PATIENT STRESSORS: Financial difficulties   Health problems   Marital or family conflict   Substance abuse     PATIENT STRENGTHS: Ability for insight  Communication skills  Motivation for treatment/growth    PATIENT IDENTIFIED PROBLEMS: To find out what is going on in my head  Suicidal ideation  Substance abuse  Anxiety  Paranoia  Homelessness  Ineffective coping skills         DISCHARGE CRITERIA:  Ability to meet basic life and health needs Motivation to continue treatment in a less acute level of care Safe-care adequate arrangements made  PRELIMINARY DISCHARGE PLAN: Attend aftercare/continuing care group Outpatient therapy Placement in alternative living arrangements  PATIENT/FAMILY INVOLVEMENT: This treatment plan has been presented to and reviewed with the patient, Carlos French, and/or family member.  The patient and family have been given the opportunity to ask questions and make suggestions.  Carlos MALVA Lowers, RN 11/24/2024, 12:10 PM

## 2024-11-24 NOTE — Progress Notes (Signed)

## 2024-11-24 NOTE — Group Note (Signed)
 Date:  11/24/2024 Time:  7:14 PM  Group Topic/Focus: Emotional Wellness Emotional Education:   The focus of this group is to discuss what feelings/emotions are, and how they are experienced.    Participation Level:  Patient was not here during group.  Participation Quality:  NA  Affect:  NA  Cognitive:  NA  Insight: NA  Engagement in Group:  NA  Modes of Intervention:  NA  Additional Comments:  Patient was not on unit  Rosaleen BIRCH Ayshia Gramlich 11/24/2024, 7:14 PM

## 2024-11-24 NOTE — [facility] Group Notes (Signed)
 Carlos French did not attend wrap up group.

## 2024-11-24 NOTE — Group Note (Signed)
 Occupational Therapy Group Note  Group Topic: Sleep Hygiene  Group Date: 11/24/2024 Start Time: 1500 End Time: 1541 Facilitators: Dot Dallas MATSU, OT   Group Description: Group encouraged increased participation and engagement through topic focused on sleep hygiene. Patients reflected on the quality of sleep they typically receive and identified areas that need improvement. Group was given background information on sleep and sleep hygiene, including common sleep disorders. Group members also received information on how to improve one's sleep and introduced a sleep diary as a tool that can be utilized to track sleep quality over a length of time. Group session ended with patients identifying one or more strategies they could utilize or implement into their sleep routine in order to improve overall sleep quality.        Therapeutic Goal(s):  Identify one or more strategies to improve overall sleep hygiene  Identify one or more areas of sleep that are negatively impacted (sleep too much, too little, etc)     Participation Level: Engaged   Participation Quality: Independent   Behavior: Appropriate   Speech/Thought Process: Relevant   Affect/Mood: Appropriate   Insight: Fair   Judgement: Fair      Modes of Intervention: Education  Patient Response to Interventions:  Attentive   Plan: Continue to engage patient in OT groups 2 - 3x/week.  11/24/2024  [place] MATSU Dot, OT   Demarcus Thielke, OT
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
## 2024-11-24 NOTE — Progress Notes (Signed)
 Pt has been accepted to [facility] on 11/24/2024 Bed assignment: 401-01  Pt meets inpatient criteria per: Kathryne Show NP  Attending Physician will be: Dr. Chandra MD  Report can be called to Adult unit  [phone]  Pt can arrive after [facility] WILL UPDATE   Care Team Notified: [facility] Cherylynn Ernst RN , Morene Cleveland MD     [place] Yadiel Aubry LCSW-A   11/24/2024 9:15 AM

## 2024-11-24 NOTE — BHH Group Notes (Signed)
 Patient did not attend Recreational Therapy Group.

## 2024-11-24 NOTE — ED Notes (Addendum)
 Patient arrived to the unit, alert and oriented to self and place.  No acute distress noted.  Patient currently denies SI/HI/AVH at this time.  He appears a bit irritable, however overall calm and cooperative.  Patient initially refused labs, and but eventually was compliant with bloodwork.  Patient received PRN Trazodone  PRN Hydroxyzine  for sleep at this time.  Patient will continue Q 15 min safety checks for safety/behavior per facility protocol.

## 2024-11-24 NOTE — Progress Notes (Signed)
 Report called to [facility]

## 2024-11-24 NOTE — Plan of Care (Signed)
   Problem: Education: Goal: Emotional status will improve Outcome: Not Progressing Goal: Mental status will improve Outcome: Not Progressing

## 2024-11-24 NOTE — Progress Notes (Signed)
 Carlos French transferred to [facility] per MD order. Discussed with the patient and all questions fully answered. An EMTALA and Med Necessity forms were printed and to be given to the receiving nurse. All belongings returned. Patient escorted out, and transferred via safe transport. Dorla Jung  11/24/2024 10:29 AM

## 2024-11-24 NOTE — ED Provider Notes (Signed)
 [facility] Continuous Assessment Admission H&P  Date: 11/24/24 Patient Name: Carlos French MRN: [id number] Chief Complaint: I'm suicidal  Diagnoses:  Final diagnoses:  Alcohol use disorder  Cocaine abuse (HCC)  Substance induced mood disorder (HCC)  Suicidal ideation   Carlos French is a 33 year old male with a history of polysubstance abuse, PTSD, MDD, Anxiety, and Depression who presents with worsening depressive symptoms and active suicidal ideation with plan to hang himself using a belt.   Patient seen face to face and his chart was reviewed by this NP on 11/24/2024. On assessment, he reports increasing stress related to frequent arguments with his mother and endorses persistent sadness, hopelessness, anhedonia, low motivation, poor concentration, sleep disturbance, and increased irritability. He states his mother "kicked him out" of the home on Thanksgiving day. He reports ongoing conflict because she does not believe his claims that people are spying on him, following him, and plotting to harm him. He endorses hearing the voice of his ex-girlfriend's colleague "Selinda" threatening to harm him and reports hearing unknown people speaking about him through the walls of his room. When asked, he denies these experiences are hallucinations and insists they are real; he says he has recorded evidence on his phone which he intends to give to police. He denies visual hallucinations and homicidal ideation. He admits to smoking $20-$30 worth of crack cocaine 2-3 times per week, smoking marijuana daily, and drinking two 40oz beers daily.  He is alert and oriented x4. Behavior is cooperative but intermittently restless and anxious. Speech is slightly pressured. Mood is described as "really depressed and anxious" and affect is constricted and anxious. Thought content is notable for suicidal ideation, paranoid and persecutory delusions, and reported auditory experiences. He denies visual  hallucinations or homicidal ideation. Insight is poor and judgment is impaired.   Patient was recommended for inpatient psychiatric hospitalization for safety, stabilization, psychosis, and substance use; he initially agreed however after eating he requested discharge. Safety concerns including active suicidal ideation, impaired judgment, and psychotic symptoms were discussed with him. After further conversation, he agreed to remain voluntarily for overnight observation for safety monitoring, with reassessment on day shift to determine appropriateness for discharge. If he is able to contract for safety and demonstrates improved stability, outpatient follow-up and substance-use treatment resources will be provided; if not, patient may benefit from inpatient admission  for continued stabilization.  HPI: Carlos French  Total Time spent with patient: 30 minutes  Musculoskeletal  Strength & Muscle Tone: within normal limits Gait & Station: normal Patient leans: Right  Psychiatric Specialty Exam  Presentation General Appearance:  Appropriate for Environment  Eye Contact: Good  Speech: Clear and Coherent  Speech Volume: Decreased  Handedness: Right   Mood and Affect  Mood: Anxious; Depressed  Affect: Congruent   Thought Process  Thought Processes: Coherent  Descriptions of Associations:Intact  Orientation:Full (Time, Place and Person)  Thought Content:WDL  Diagnosis of Schizophrenia or Schizoaffective disorder in past: No   Hallucinations:Hallucinations: None  Ideas of Reference:Paranoia  Suicidal Thoughts:Suicidal Thoughts: Yes, Active SI Active Intent and/or Plan: With Plan; With Intent  Homicidal Thoughts:Homicidal Thoughts: No   Sensorium  Memory: Immediate Good; Recent Fair; Remote Fair  Judgment: Impaired  Insight: Poor   Executive Functions  Concentration: Poor  Attention Span: Fair  Recall: [employer] of  Knowledge: Fair  Language: Fair   Psychomotor Activity  Psychomotor Activity: Psychomotor Activity: Restlessness   Assets  Assets: Communication Skills; Desire for Improvement  Sleep  Sleep: Sleep: Fair Number of Hours of Sleep: 5   Nutritional Assessment (For OBS and FBC admissions only) Has the patient had a weight loss or gain of 10 pounds or more in the last 3 months?: No Has the patient had a decrease in food intake/or appetite?: No Does the patient have dental problems?: No Does the patient have eating habits or behaviors that may be indicators of an eating disorder including binging or inducing vomiting?: No Has the patient recently lost weight without trying?: 0 Has the patient been eating poorly because of a decreased appetite?: 0 Malnutrition Screening Tool Score: 0    Physical Exam Vitals and nursing note reviewed.  Constitutional:      General: He is not in acute distress.    Appearance: He is well-developed. He is not ill-appearing, toxic-appearing or diaphoretic.  HENT:     Head: Normocephalic and atraumatic.  Eyes:     Conjunctiva/sclera: Conjunctivae normal.  Cardiovascular:     Rate and Rhythm: Normal rate.  Pulmonary:     Effort: Pulmonary effort is normal.  Musculoskeletal:        General: No swelling. Normal range of motion.     Cervical back: Normal range of motion.  Neurological:     Mental Status: He is alert and oriented to person, place, and time.  Psychiatric:        Attention and Perception: Attention normal. He perceives auditory hallucinations.        Mood and Affect: Mood is anxious and depressed.        Speech: Speech normal.        Behavior: Behavior normal. Behavior is actively hallucinating. Behavior is cooperative.        Thought Content: Thought content includes suicidal ideation. Thought content includes suicidal plan.        Cognition and Memory: Cognition normal.    Review of Systems  Constitutional: Negative.    HENT: Negative.    Eyes: Negative.   Respiratory: Negative.    Cardiovascular: Negative.   Gastrointestinal: Negative.   Genitourinary: Negative.   Musculoskeletal: Negative.   Skin: Negative.   Neurological: Negative.   Endo/Heme/Allergies: Negative.   Psychiatric/Behavioral:  Positive for depression, hallucinations, substance abuse and suicidal ideas. The patient is nervous/anxious.     Blood pressure (!) 150/98, pulse 95, temperature (!) 97.1 F (36.2 C), temperature source Oral, resp. rate 20, SpO2 99%. There is no height or weight on file to calculate BMI.  Past Psychiatric History: PTSD, MDD, Anxiety, Depression, PTSD, Bipolar, Alcohol abuse, cocaine abuse, SUD   Is the patient at risk to self? Yes  Has the patient been a risk to self in the past 6 months? No .    Has the patient been a risk to self within the distant past? Yes   Is the patient a risk to others? No   Has the patient been a risk to others in the past 6 months? No   Has the patient been a risk to others within the distant past? No   Past Medical History:  Past Medical History:  Diagnosis Date   Allergy    Anxiety    Bipolar 1 disorder (HCC)    Depression    GERD (gastroesophageal reflux disease)    GI (gastrointestinal bleed)    GI bleeding    PTSD (post-traumatic stress disorder)    Suicidal ideation      Family History:  Family History  Problem Relation Age of Onset  Stroke Mother      Social History:  Social History   Tobacco Use   Smoking status: Every Day    Current packs/day: 1.00    Types: Cigarettes   Smokeless tobacco: Former  Building Services Engineer status: Never Used  Substance Use Topics   Alcohol use: Yes    Alcohol/week: 84.0 standard drinks of alcohol    Types: 84 Cans of beer per week   Drug use: Yes    Types: Marijuana, Cocaine    Comment: marijuana daily and crack cocain a few times a week     Last Labs:  Admission on 11/24/2024  Component Date Value Ref Range  Status   WBC 11/24/2024 7.8  4.0 - 10.5 K/uL Final   RBC 11/24/2024 4.78  4.22 - 5.81 MIL/uL Final   Hemoglobin 11/24/2024 15.8  13.0 - 17.0 g/dL Final   HCT 87/98/7974 45.4  39.0 - 52.0 % Final   MCV 11/24/2024 95.0  80.0 - 100.0 fL Final   MCH 11/24/2024 33.1  26.0 - 34.0 pg Final   MCHC 11/24/2024 34.8  30.0 - 36.0 g/dL Final   RDW 87/98/7974 12.3  11.5 - 15.5 % Final   Platelets 11/24/2024 299  150 - 400 K/uL Final   nRBC 11/24/2024 0.0  0.0 - 0.2 % Final   Neutrophils Relative % 11/24/2024 59  % Final   Neutro Abs 11/24/2024 4.6  1.7 - 7.7 K/uL Final   Lymphocytes Relative 11/24/2024 27  % Final   Lymphs Abs 11/24/2024 2.1  0.7 - 4.0 K/uL Final   Monocytes Relative 11/24/2024 10  % Final   Monocytes Absolute 11/24/2024 0.8  0.1 - 1.0 K/uL Final   Eosinophils Relative 11/24/2024 3  % Final   Eosinophils Absolute 11/24/2024 0.2  0.0 - 0.5 K/uL Final   Basophils Relative 11/24/2024 1  % Final   Basophils Absolute 11/24/2024 0.1  0.0 - 0.1 K/uL Final   Immature Granulocytes 11/24/2024 0  % Final   Abs Immature Granulocytes 11/24/2024 0.03  0.00 - 0.07 K/uL Final   Performed at [facility], [address] [place], [place] [address]   Sodium 11/24/2024 135  135 - 145 mmol/L Final   Potassium 11/24/2024 3.2 (L)  3.5 - 5.1 mmol/L Final   Chloride 11/24/2024 96 (L)  98 - 111 mmol/L Final   CO2 11/24/2024 26  22 - 32 mmol/L Final   Glucose, Bld 11/24/2024 67 (L)  70 - 99 mg/dL Final   Glucose reference range applies only to samples taken after fasting for at least 8 hours.   BUN 11/24/2024 9  6 - 20 mg/dL Final   Creatinine, Ser 11/24/2024 0.82  0.61 - 1.24 mg/dL Final   Calcium  11/24/2024 9.2  8.9 - 10.3 mg/dL Final   Total Protein 87/98/7974 7.8  6.5 - 8.1 g/dL Final   Albumin 87/98/7974 4.5  3.5 - 5.0 g/dL Final   AST 87/98/7974 21  15 - 41 U/L Final   ALT 11/24/2024 13  0 - 44 U/L Final   Alkaline Phosphatase 11/24/2024 68  38 - 126 U/L Final   Total Bilirubin 11/24/2024  0.6  0.0 - 1.2 mg/dL Final   GFR, Estimated 11/24/2024 >60  >60 mL/min Final   Comment: (NOTE) Calculated using the CKD-EPI Creatinine Equation (2021)    Anion gap 11/24/2024 13  5 - 15 Final   Performed at [facility], [address] [place], [place] [address]  Alcohol, Ethyl (B) 11/24/2024 94 (H)  <15 mg/dL Final   Comment: (NOTE) For medical purposes only. Performed at [facility], [address], [place], [place] [address]    Cholesterol 11/24/2024 183  0 - 200 mg/dL Final   Triglycerides 87/98/7974 91  <150 mg/dL Final   HDL 87/98/7974 74  >40 mg/dL Final   Total CHOL/HDL Ratio 11/24/2024 2.5  RATIO Final   VLDL 11/24/2024 18  0 - 40 mg/dL Final   LDL Cholesterol 11/24/2024 91  0 - 99 mg/dL Final   Comment:        Total Cholesterol/HDL:CHD Risk Coronary Heart Disease Risk Table                     Men   Women  1/2 Average Risk   3.4   3.3  Average Risk       5.0   4.4  2 X Average Risk   9.6   7.1  3 X Average Risk  23.4   11.0        Use the calculated Patient Ratio above and the CHD Risk Table to determine the patient's CHD Risk.        ATP III CLASSIFICATION (LDL):  <100     mg/dL   Optimal  899-870  mg/dL   Near or Above                    Optimal  130-159  mg/dL   Borderline  839-810  mg/dL   High  >809     mg/dL   Very High Performed at [facility], [address] [place], [place] [address]    TSH 11/24/2024 1.304  0.350 - 4.500 uIU/mL Final   Comment: Performed by a 3rd Generation assay with a functional sensitivity of <=0.01 uIU/mL. Performed at [facility], [address], [place], [place] [address]    POC Amphetamine UR 11/24/2024 None Detected  NONE DETECTED (Cut Off Level 1000 ng/mL) Final   POC Secobarbital (BAR) 11/24/2024 None Detected  NONE DETECTED (Cut Off Level 300 ng/mL) Final   POC Buprenorphine (BUP) 11/24/2024 None Detected  NONE DETECTED (Cut Off Level 10 ng/mL) Final   POC Oxazepam (BZO) 11/24/2024 None  Detected  NONE DETECTED (Cut Off Level 300 ng/mL) Final   POC Cocaine UR 11/24/2024 Positive (A)  NONE DETECTED (Cut Off Level 300 ng/mL) Final   POC Methamphetamine UR 11/24/2024 None Detected  NONE DETECTED (Cut Off Level 1000 ng/mL) Final   POC Morphine 11/24/2024 None Detected  NONE DETECTED (Cut Off Level 300 ng/mL) Final   POC Methadone UR 11/24/2024 None Detected  NONE DETECTED (Cut Off Level 300 ng/mL) Final   POC Oxycodone UR 11/24/2024 None Detected  NONE DETECTED (Cut Off Level 100 ng/mL) Final   POC Marijuana UR 11/24/2024 Positive (A)  NONE DETECTED (Cut Off Level 50 ng/mL) Final  Admission on 09/07/2024, Discharged on 09/11/2024  Component Date Value Ref Range Status   Sodium 09/08/2024 132 (L)  135 - 145 mmol/L Final   Potassium 09/08/2024 4.4  3.5 - 5.1 mmol/L Final   Chloride 09/08/2024 91 (L)  98 - 111 mmol/L Final   CO2 09/08/2024 28  22 - 32 mmol/L Final   Glucose, Bld 09/08/2024 158 (H)  70 - 99 mg/dL Final   Glucose reference range applies only to samples taken after fasting for at least 8 hours.   BUN 09/08/2024 16  6 - 20 mg/dL Final   Creatinine, Ser 09/08/2024 0.98  0.61 - 1.24 mg/dL Final   Calcium  09/08/2024 10.3  8.9 - 10.3 mg/dL Final   Total Protein 90/84/7974 8.2 (H)  6.5 - 8.1 g/dL Final   Albumin 90/84/7974 4.7  3.5 - 5.0 g/dL Final   AST 90/84/7974 25  15 - 41 U/L Final   ALT 09/08/2024 17  0 - 44 U/L Final   Alkaline Phosphatase 09/08/2024 74  38 - 126 U/L Final   Total Bilirubin 09/08/2024 1.0  0.0 - 1.2 mg/dL Final   GFR, Estimated 09/08/2024 >60  >60 mL/min Final   Comment: (NOTE) Calculated using the CKD-EPI Creatinine Equation (2021)    Anion gap 09/08/2024 13  5 - 15 Final   Performed at [facility], [address] [place], [place] [address]   RPR Ser Ql 09/08/2024 NON REACTIVE  NON REACTIVE Final   Performed at [facility], [address], [place], [place] [address]   Hepatitis B Surface Ag 09/08/2024 NON REACTIVE  NON REACTIVE  Final   HCV Ab 09/08/2024 NON REACTIVE  NON REACTIVE Final   Comment: (NOTE) Nonreactive HCV antibody screen is consistent with no HCV infections,  unless recent infection is suspected or other evidence exists to indicate HCV infection.     Hep A IgM 09/08/2024 NON REACTIVE  NON REACTIVE Final   Hep B C IgM 09/08/2024 NON REACTIVE  NON REACTIVE Final   Performed at [facility], [address], [place], [place] [address]   TSH 09/08/2024 5.382 (H)  0.350 - 4.500 uIU/mL Final   Comment: Performed by a 3rd Generation assay with a functional sensitivity of <=0.01 uIU/mL. Performed at [facility], [address] [place], [place] [address]   Admission on 09/07/2024, Discharged on 09/07/2024  Component Date Value Ref Range Status   Sodium 09/07/2024 134 (L)  135 - 145 mmol/L Final   Potassium 09/07/2024 4.2  3.5 - 5.1 mmol/L Final   Chloride 09/07/2024 100  98 - 111 mmol/L Final   CO2 09/07/2024 20 (L)  22 - 32 mmol/L Final   Glucose, Bld 09/07/2024 87  70 - 99 mg/dL Final   Glucose reference range applies only to samples taken after fasting for at least 8 hours.   BUN 09/07/2024 6  6 - 20 mg/dL Final   Creatinine, Ser 09/07/2024 0.72  0.61 - 1.24 mg/dL Final   Calcium  09/07/2024 9.1  8.9 - 10.3 mg/dL Final   Total Protein 90/85/7974 7.8  6.5 - 8.1 g/dL Final   Albumin 90/85/7974 4.4  3.5 - 5.0 g/dL Final   AST 90/85/7974 22  15 - 41 U/L Final   ALT 09/07/2024 14  0 - 44 U/L Final   Alkaline Phosphatase 09/07/2024 67  38 - 126 U/L Final   Total Bilirubin 09/07/2024 0.7  0.0 - 1.2 mg/dL Final   GFR, Estimated 09/07/2024 >60  >60 mL/min Final   Comment: (NOTE) Calculated using the CKD-EPI Creatinine Equation (2021)    Anion gap 09/07/2024 14  5 - 15 Final   Performed at [facility], [address], [place], [place] [address]   Alcohol, Ethyl (B) 09/07/2024 <15  <15 mg/dL Final   Comment: (NOTE) For medical purposes only. Performed at [facility], [address], [place], [place] [address]    WBC 09/07/2024 6.4  4.0 - 10.5 K/uL Final   RBC 09/07/2024 4.78  4.22 - 5.81 MIL/uL Final   Hemoglobin 09/07/2024 15.5  13.0 - 17.0 g/dL Final   HCT 90/85/7974 46.5  39.0 - 52.0 % Final   MCV 09/07/2024 97.3  80.0 - 100.0 fL Final   MCH 09/07/2024 32.4  26.0 - 34.0 pg Final   MCHC 09/07/2024 33.3  30.0 - 36.0 g/dL Final   RDW 90/85/7974 13.2  11.5 - 15.5 % Final   Platelets 09/07/2024 266  150 - 400 K/uL Final   nRBC 09/07/2024 0.0  0.0 - 0.2 % Final   Performed at [facility], [address], [place], [place] [address]   Opiates 09/07/2024 NONE DETECTED  NONE DETECTED Final   Cocaine 09/07/2024 POSITIVE (A)  NONE DETECTED Final   Benzodiazepines 09/07/2024 NONE DETECTED  NONE DETECTED Final   Amphetamines 09/07/2024 POSITIVE (A)  NONE DETECTED Final   Comment: (NOTE) Trazodone  is metabolized in vivo to several metabolites, including pharmacologically active m-CPP, which is excreted in the urine. Immunoassay screens for amphetamines and MDMA have potential cross-reactivity with these compounds and may provide false positive  results.     Tetrahydrocannabinol 09/07/2024 POSITIVE (A)  NONE DETECTED Final   Barbiturates 09/07/2024 NONE DETECTED  NONE DETECTED Final   Comment: (NOTE) DRUG SCREEN FOR MEDICAL PURPOSES ONLY.  IF CONFIRMATION IS NEEDED FOR ANY PURPOSE, NOTIFY LAB WITHIN 5 DAYS.  LOWEST DETECTABLE LIMITS FOR URINE DRUG SCREEN Drug Class                     Cutoff (ng/mL) Amphetamine and metabolites    1000 Barbiturate and metabolites    200 Benzodiazepine                 200 Opiates and metabolites        300 Cocaine and metabolites        300 THC                            50 Performed at [facility], [address], [place], [place] [address]     Allergies: Tylenol  [acetaminophen ], Motrin  [ibuprofen ], and Nsaids  Medications:  Facility Ordered Medications  Medication   alum & mag hydroxide-simeth  (MAALOX/MYLANTA) 200-200-20 MG/5ML suspension 30 mL   magnesium  hydroxide (MILK OF MAGNESIA) suspension 30 mL   haloperidol  (HALDOL ) tablet 5 mg   And   diphenhydrAMINE  (BENADRYL ) capsule 50 mg   haloperidol  lactate (HALDOL ) injection 5 mg   And   diphenhydrAMINE  (BENADRYL ) injection 50 mg   haloperidol  lactate (HALDOL ) injection 10 mg   And   diphenhydrAMINE  (BENADRYL ) injection 50 mg   And   LORazepam  (ATIVAN ) injection 2 mg   hydrOXYzine  (ATARAX ) tablet 25 mg   traZODone  (DESYREL ) tablet 50 mg   thiamine  (VITAMIN B1) injection 100 mg   [START ON 11/25/2024] thiamine  (VITAMIN B1) tablet 100 mg   multivitamin with minerals tablet 1 tablet   LORazepam  (ATIVAN ) tablet 1 mg   hydrOXYzine  (ATARAX ) tablet 25 mg   loperamide  (IMODIUM ) capsule 2-4 mg   ondansetron  (ZOFRAN -ODT) disintegrating tablet 4 mg   PTA Medications  Medication Sig   nicotine  polacrilex (NICORETTE ) 2 MG gum Take 1 each (2 mg total) by mouth as needed for smoking cessation.   hydrOXYzine  (ATARAX ) 25 MG tablet Take 1 tablet (25 mg total) by mouth 3 (three) times daily as needed for anxiety.   OLANZapine  (ZYPREXA ) 5 MG tablet Take 1 tablet (5 mg  total) by mouth at bedtime.   Multiple Vitamins-Minerals (B COMPLEX-C-E-ZINC ) tablet Take 1 tablet by mouth daily.      Medical Decision Making  Patient was recommended for inpatient psychiatric hospitalization for safety, stabilization, psychosis, and substance use; he initially agreed however after eating he requested discharge. Safety concerns including active suicidal ideation, impaired judgment, and psychotic symptoms were discussed with him. After further conversation, he agreed to remain voluntarily for overnight observation for safety monitoring, with reassessment on day shift to determine appropriateness for discharge. If he is able to contract for safety and demonstrates improved stability, outpatient follow-up and substance-use treatment resources will be provided; if  not, patient may benefit from inpatient admission  for continued stabilization.   Lab Orders         CBC with Differential/Platelet         Comprehensive metabolic panel         Hemoglobin A1c         Ethanol         Lipid panel         TSH         POCT Urine Drug Screen - (I-Screen)     Initiate CIWA protocol -lorazepam  1 mg every 6 hours prn for CIWA >10 -thiamine  100 mg daily for nutritional supplementation -hydroxyzine  25 mg every 6 hours prn for anxiety, CIWA < or = 10 -ondansetron  4 mg ODT every 6 hours prn nausea/vomiting -loperamide  2-4 mg capsule prn diarrhea or loose stools  Recommendations  Based on my evaluation the patient does not appear to have an emergency medical condition.  Kathryne DELENA Show, NP 11/24/24  3:23 AM

## 2024-11-24 NOTE — ED Provider Notes (Signed)
 [facility]/OBS ASAP Discharge Summary  Date and Time: 11/24/2024 9:59 AM  Name: Carlos French  MRN:  [id number]   Discharge Diagnoses:  Final diagnoses:  Alcohol use disorder  Cocaine abuse (HCC)  Substance induced mood disorder (HCC)  Suicidal ideation    Subjective: Carlos French is a 33 year old male with a history of polysubstance abuse, PTSD, MDD, Anxiety, and Depression who presents with worsening depressive symptoms and active suicidal ideation with plan to hang himself using a belt.   Patient interviewed in the adult interview room with medical student.  Patient says that he is doing worse than last night when he arrived.  He remains actively suicidal with plans, intent, and readily available means (plan to strangle himself with a belt).  Patient is paranoid that he is being pursued by a combination of an ex-girlfriend as well as multiple other associates of hers.  He reports delusions that he is being followed closely enough but his Bluetooth is connecting to her car's stereo.  He reports that he was hearing sounds of guns being cocked and plans being attached to kill him from the next hotel room.  Stay Summary: Pt was admitted overnight, observed for withdrawal symptoms from Alcohol use disorder. Recommended for inpatient treatment and stabilization  Total Time spent with patient: 20 minutes  Past Psychiatric History: PTSD, GAD, MDD, Unspecified psychotic disorder, questionable bipolar 1 disorder, polysubstance abuse (cocaine, alcohol) Past Medical History:  Past Medical History:  Diagnosis Date   Allergy    Anxiety    Bipolar 1 disorder (HCC)    Depression    GERD (gastroesophageal reflux disease)    GI (gastrointestinal bleed)    GI bleeding    PTSD (post-traumatic stress disorder)    Suicidal ideation     Family History  Problem Relation Age of Onset   Stroke Mother     Social History: lives in hotels, unemployed. Tobacco Cessation:  A prescription for an  FDA-approved tobacco cessation medication provided at discharge  Current Medications:  Current Facility-Administered Medications  Medication Dose Route Frequency Provider Last Rate Last Admin   alum & mag hydroxide-simeth (MAALOX/MYLANTA) 200-200-20 MG/5ML suspension 30 mL  30 mL Oral Q4H PRN Ajibola, Ene A, NP       haloperidol  (HALDOL ) tablet 5 mg  5 mg Oral TID PRN Ajibola, Ene A, NP       And   diphenhydrAMINE  (BENADRYL ) capsule 50 mg  50 mg Oral TID PRN Ajibola, Ene A, NP       haloperidol  lactate (HALDOL ) injection 5 mg  5 mg Intramuscular TID PRN Ajibola, Ene A, NP       And   diphenhydrAMINE  (BENADRYL ) injection 50 mg  50 mg Intramuscular TID PRN Ajibola, Ene A, NP       haloperidol  lactate (HALDOL ) injection 10 mg  10 mg Intramuscular TID PRN Ajibola, Ene A, NP       And   diphenhydrAMINE  (BENADRYL ) injection 50 mg  50 mg Intramuscular TID PRN Ajibola, Ene A, NP       And   LORazepam  (ATIVAN ) injection 2 mg  2 mg Intramuscular TID PRN Ajibola, Ene A, NP       hydrOXYzine  (ATARAX ) tablet 25 mg  25 mg Oral TID PRN Ajibola, Ene A, NP   25 mg at 11/24/24 0200   hydrOXYzine  (ATARAX ) tablet 25 mg  25 mg Oral Q6H PRN Ajibola, Ene A, NP       loperamide  (IMODIUM ) capsule 2-4 mg  2-4  mg Oral PRN Ajibola, Ene A, NP       LORazepam  (ATIVAN ) tablet 1 mg  1 mg Oral Q6H PRN Ajibola, Ene A, NP       magnesium  hydroxide (MILK OF MAGNESIA) suspension 30 mL  30 mL Oral Daily PRN Ajibola, Ene A, NP       multivitamin with minerals tablet 1 tablet  1 tablet Oral Daily Ajibola, Ene A, NP   1 tablet at 11/24/24 0940   ondansetron  (ZOFRAN -ODT) disintegrating tablet 4 mg  4 mg Oral Q6H PRN Ajibola, Ene A, NP       [START ON 11/25/2024] thiamine  (VITAMIN B1) tablet 100 mg  100 mg Oral Daily Ajibola, Ene A, NP       traZODone  (DESYREL ) tablet 50 mg  50 mg Oral QHS PRN Ajibola, Ene A, NP   50 mg at 11/24/24 0200   Current Outpatient Medications  Medication Sig Dispense Refill   hydrOXYzine  (ATARAX ) 25 MG  tablet Take 1 tablet (25 mg total) by mouth 3 (three) times daily as needed for anxiety. (Patient not taking: Reported on 11/24/2024) 30 tablet 0   Multiple Vitamins-Minerals (B COMPLEX-C-E-ZINC ) tablet Take 1 tablet by mouth daily. (Patient not taking: Reported on 11/24/2024) 30 tablet 0   nicotine  polacrilex (NICORETTE ) 2 MG gum Take 1 each (2 mg total) by mouth as needed for smoking cessation. (Patient not taking: Reported on 11/24/2024) 100 tablet 0   OLANZapine  (ZYPREXA ) 5 MG tablet Take 1 tablet (5 mg total) by mouth at bedtime. (Patient not taking: Reported on 11/24/2024) 30 tablet 0    PTA Medications:  Facility Ordered Medications  Medication   alum & mag hydroxide-simeth (MAALOX/MYLANTA) 200-200-20 MG/5ML suspension 30 mL   magnesium  hydroxide (MILK OF MAGNESIA) suspension 30 mL   haloperidol  (HALDOL ) tablet 5 mg   And   diphenhydrAMINE  (BENADRYL ) capsule 50 mg   haloperidol  lactate (HALDOL ) injection 5 mg   And   diphenhydrAMINE  (BENADRYL ) injection 50 mg   haloperidol  lactate (HALDOL ) injection 10 mg   And   diphenhydrAMINE  (BENADRYL ) injection 50 mg   And   LORazepam  (ATIVAN ) injection 2 mg   hydrOXYzine  (ATARAX ) tablet 25 mg   traZODone  (DESYREL ) tablet 50 mg   [COMPLETED] thiamine  (VITAMIN B1) injection 100 mg   [START ON 11/25/2024] thiamine  (VITAMIN B1) tablet 100 mg   multivitamin with minerals tablet 1 tablet   LORazepam  (ATIVAN ) tablet 1 mg   hydrOXYzine  (ATARAX ) tablet 25 mg   loperamide  (IMODIUM ) capsule 2-4 mg   ondansetron  (ZOFRAN -ODT) disintegrating tablet 4 mg   PTA Medications  Medication Sig   nicotine  polacrilex (NICORETTE ) 2 MG gum Take 1 each (2 mg total) by mouth as needed for smoking cessation. (Patient not taking: Reported on 11/24/2024)   hydrOXYzine  (ATARAX ) 25 MG tablet Take 1 tablet (25 mg total) by mouth 3 (three) times daily as needed for anxiety. (Patient not taking: Reported on 11/24/2024)   OLANZapine  (ZYPREXA ) 5 MG tablet Take 1 tablet (5 mg  total) by mouth at bedtime. (Patient not taking: Reported on 11/24/2024)   Multiple Vitamins-Minerals (B COMPLEX-C-E-ZINC ) tablet Take 1 tablet by mouth daily. (Patient not taking: Reported on 11/24/2024)        No data to display          Flowsheet Row ED from 09/07/2024 in [facility] Emergency Department at [facility] ED from 11[facility]12/2023 in [facility] Emergency Department at [facility] UC from 05/18/2022 in [facility] at [facility]  [facility]  C-SSRS RISK CATEGORY High Risk No Risk No Risk    Musculoskeletal  Strength & Muscle Tone: within normal limits Gait & Station: normal Patient leans: N/A  Psychiatric Specialty Exam  Mental Status Exam:  Appearance: Caucasian male found sleeping, appears disheveled. Wearing hospital paper scrubs. Patient is malodorous and unwashed.  Behavior: Cooperative, fair eye contact.   Attitude: Guarded  Speech: Garbled, normal rate, normal volume  Mood: I am suicidal  Affect: Congruent. Appears anxious, guarded, confused, constricted.  Thought Process: Linear partially coherent  Thought Content: Delusional - delusions of persecution and paranoia by ex girlfriend and her associates.   SI/HI: Active SI with intent and plan, Denied HI  Perceptions: Auditory hallucinations of guns and voices.  Judgment: Fair  Insight: Shallow  Fund of Knowledge: WNL    Sleep  Sleep: Sleep: Fair  Estimated Sleeping Duration (Last 24 Hours): 0.00 hours  Nutritional Assessment (For OBS and FBC admissions only) Has the patient had a weight loss or gain of 10 pounds or more in the last 3 months?: No Has the patient had a decrease in food intake/or appetite?: No Does the patient have dental problems?: No Does the patient have eating habits or behaviors that may be indicators of an eating disorder including binging or inducing vomiting?: No Has the patient recently lost weight without trying?: 0 Has the patient been eating  poorly because of a decreased appetite?: 0 Malnutrition Screening Tool Score: 0    Physical Exam  Physical Exam Vitals and nursing note reviewed.  Constitutional:      General: He is not in acute distress.    Appearance: He is well-developed.  HENT:     Head: Normocephalic and atraumatic.  Eyes:     Conjunctiva/sclera: Conjunctivae normal.  Cardiovascular:     Rate and Rhythm: Normal rate and regular rhythm.  Pulmonary:     Effort: Pulmonary effort is normal.  Skin:    General: Skin is warm and dry.     Capillary Refill: Capillary refill takes less than 2 seconds.  Neurological:     General: No focal deficit present.     Mental Status: He is alert.  Psychiatric:        Attention and Perception: Attention normal. He perceives auditory hallucinations.        Mood and Affect: Mood is depressed. Affect is blunt.        Speech: Speech is slurred.        Behavior: Behavior is withdrawn and actively hallucinating. Behavior is cooperative.        Thought Content: Thought content is paranoid and delusional. Thought content includes suicidal ideation. Thought content does not include homicidal ideation. Thought content includes suicidal plan.        Cognition and Memory: Cognition and memory normal.        Judgment: Judgment normal.    ROS Blood pressure 131/79, pulse 88, temperature 98 F (36.7 C), temperature source Oral, resp. rate 18, SpO2 98%. There is no height or weight on file to calculate BMI.  Suicide Risk Assessment:  Suicidal ideation/thoughts:   [x]  Current         []  Recent         []  Denies        []  Remote   []  Chronic  Intention to act or plan:        [x]  Current     []  Recent []  Denies  []  Remote  Preparatory behavior:  []  Recent    [  x] Denies Recent      [x]  Remote Instance  Suicide attempts:  []  Immediately prior to this admission     []  During this admission    []  Recent    [x]  Multiple   []  Remote    []  Denies Ever     Risk Factors  Protective Factors   Acute  Easy access to highly lethal means, Current SI or Intent, Escalating substance use, Recent loss (job, relationship, family member), Acute distress state, Hopelessness or desperation, Severe anxiety/panic, Sense of purposelessness, Withdrawing from society, Recent impulsivity or acting recklessly, Feelings of humiliation, shame, or guilt, Legal problems, Currently psychotic, and Acute intoxication AcuteSuicideProtectiveFactors: No recent suicide attempts, No signs of apparent anger/rage, No recent severe insomnia, Minimal feelings of rejection or abandonment, and No severe pain  Chronic Previous suicide attempt, Major psychiatric disorder, History of abuse/trauma, Single, Separated, or Divorced, Chronic unemployment, Lack of social support, Lives alone, Hx of Traumatic Brain Injury (TBI), Caucasian Race, Male sex, Age (15-24 or >66), and Barriers to accessing healthcare Good physical health and Willingness to seek help   Potential future factors that may impact risk: FutureSuicideFactors : Expected change in living situation  Summary: While it is impossible to accurately predict with absolute certainty future events and human behaviors, an assessment of current suicidal indicators, risk factors, and protective factors suggests that this patient's:   Acute suicide risk is: marked in degree.    Chronic suicide risk is: marked in degree.   Suicide risk increases with substance/alcohol use and acute intoxication.   Disposition: To [facility]  Lynwood Morene Lavone Delsie, MD 11/24/2024, 9:59 AM

## 2024-11-24 NOTE — Progress Notes (Signed)
 Pt is awake, alert and oriented X4. Pt did not voice any complaints of pain or discomfort. No signs of acute distress noted. Pt was irritable and guarded during assessment. Pt responded maybe when asked if he was suicidal. Administered scheduled meds per order. Pt denies pain and current HI/AVH. Staff will monitor for pt's safety.

## 2024-11-24 NOTE — Progress Notes (Signed)
 Admission Note: Patient is a 33 year old male admitted to the unit voluntarily from [facility] for SI with plan to hang self wit a belt, substance abuse, paranoia and homelessness.  UDS positive for Cocaine and THC with BAL level at 94.  Patient is alert and oriented x 4.  Patient presents with anxious affect and mood.  Paranoid that people are after him.  Stated he is here to find out what is going on in his head.  Admission plan of care reviewed, consent signed.  Skin and personal belonging search completed.  Skin is dry and intact.  Items deemed contraband placed in the locker.  Patient oriented to the unit, staff and room.  Routine safety checks initiated. Verbalizes understanding of unit rules//protocols.  Patient is safe on the unit.

## 2024-11-24 NOTE — Plan of Care (Signed)
   Problem: Education: Goal: Knowledge of [facility] [facility] Education information/materials will improve Outcome: Progressing Goal: Emotional status will improve Outcome: Progressing Goal: Mental status will improve Outcome: Progressing

## 2024-11-25 DIAGNOSIS — F1990 Other psychoactive substance use, unspecified, uncomplicated: Secondary | ICD-10-CM

## 2024-11-25 MED ORDER — CHLORDIAZEPOXIDE HCL 25 MG PO CAPS: 25.0000 mg | ORAL_CAPSULE | ORAL | Status: DC

## 2024-11-25 MED ORDER — CHLORDIAZEPOXIDE HCL 25 MG PO CAPS
25.0000 mg | ORAL_CAPSULE | ORAL | Status: AC
  Administered 2024-11-27 – 2024-11-28 (×2): 25 mg via ORAL
  Filled 2024-11-25 (×2): qty 1

## 2024-11-25 MED ORDER — CHLORDIAZEPOXIDE HCL 25 MG PO CAPS
25.0000 mg | ORAL_CAPSULE | Freq: Every day | ORAL | Status: DC
  Filled 2024-11-25: qty 1

## 2024-11-25 MED ORDER — CHLORDIAZEPOXIDE HCL 25 MG PO CAPS
25.0000 mg | ORAL_CAPSULE | Freq: Four times a day (QID) | ORAL | Status: AC
  Administered 2024-11-25 – 2024-11-26 (×3): 25 mg via ORAL
  Filled 2024-11-25 (×3): qty 1

## 2024-11-25 MED ORDER — NICOTINE 14 MG/24HR TD PT24
14.0000 mg | MEDICATED_PATCH | Freq: Every day | TRANSDERMAL | Status: DC
  Administered 2024-11-26 – 2024-11-28 (×2): 14 mg via TRANSDERMAL
  Filled 2024-11-25 (×4): qty 1

## 2024-11-25 MED ORDER — CHLORDIAZEPOXIDE HCL 25 MG PO CAPS
25.0000 mg | ORAL_CAPSULE | Freq: Three times a day (TID) | ORAL | Status: AC
  Administered 2024-11-26 – 2024-11-27 (×3): 25 mg via ORAL
  Filled 2024-11-25 (×3): qty 1

## 2024-11-25 MED ORDER — CHLORDIAZEPOXIDE HCL 25 MG PO CAPS: 25.0000 mg | ORAL_CAPSULE | Freq: Four times a day (QID) | ORAL | Status: AC | PRN

## 2024-11-25 MED ORDER — NICOTINE 21 MG/24HR TD PT24: 21.0000 mg | MEDICATED_PATCH | Freq: Every day | TRANSDERMAL | Status: DC

## 2024-11-25 MED ORDER — CHLORDIAZEPOXIDE HCL 25 MG PO CAPS: 25.0000 mg | ORAL_CAPSULE | Freq: Four times a day (QID) | ORAL | Status: DC

## 2024-11-25 MED ORDER — HYDROXYZINE HCL 25 MG PO TABS
25.0000 mg | ORAL_TABLET | Freq: Four times a day (QID) | ORAL | Status: AC | PRN
  Administered 2024-11-25 – 2024-11-27 (×4): 25 mg via ORAL
  Filled 2024-11-25 (×4): qty 1

## 2024-11-25 MED ORDER — CHLORDIAZEPOXIDE HCL 25 MG PO CAPS: 25.0000 mg | ORAL_CAPSULE | Freq: Three times a day (TID) | ORAL | Status: DC

## 2024-11-25 MED ORDER — OLANZAPINE 5 MG PO TABS
5.0000 mg | ORAL_TABLET | Freq: Every day | ORAL | Status: DC
  Administered 2024-11-25 – 2024-11-28 (×4): 5 mg via ORAL
  Filled 2024-11-25: qty 1
  Filled 2024-11-25: qty 7
  Filled 2024-11-25 (×3): qty 1

## 2024-11-25 MED ORDER — CHLORDIAZEPOXIDE HCL 25 MG PO CAPS: 25.0000 mg | ORAL_CAPSULE | Freq: Every day | ORAL | Status: DC

## 2024-11-25 MED ORDER — POTASSIUM CHLORIDE CRYS ER 20 MEQ PO TBCR
40.0000 meq | EXTENDED_RELEASE_TABLET | Freq: Once | ORAL | Status: AC
  Administered 2024-11-25: 40 meq via ORAL
  Filled 2024-11-25: qty 2

## 2024-11-25 MED ORDER — NICOTINE POLACRILEX 2 MG MT GUM
2.0000 mg | CHEWING_GUM | OROMUCOSAL | Status: DC | PRN
  Administered 2024-11-26 – 2024-11-28 (×5): 2 mg via ORAL
  Filled 2024-11-25 (×4): qty 1

## 2024-11-25 MED ORDER — CLONIDINE HCL 0.1 MG PO TABS
0.1000 mg | ORAL_TABLET | Freq: Once | ORAL | Status: AC
  Administered 2024-11-25: 0.1 mg via ORAL
  Filled 2024-11-25: qty 1
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
## 2024-11-25 NOTE — Group Note (Signed)
 Date:  11/25/2024 Time:  10:32 AM  Group Topic/Focus: Coping skills and orientation goals group Goals Group:   The focus of this group is to help patients establish daily goals to achieve during treatment and discuss how the patient can incorporate goal setting into their daily lives to aide in recovery. Orientation:   The focus of this group is to educate the patient on the purpose and policies of crisis stabilization and provide a format to answer questions about their admission.  The group details unit policies and expectations of patients while admitted.    Participation Level:  Did Not Attend   Dolores CHRISTELLA Fredericks 11/25/2024, 10:32 AM

## 2024-11-25 NOTE — Progress Notes (Signed)
(  Sleep Hours) -12.0 as of 0530 (Any PRNs that were needed, meds refused, or side effects to meds)- prn hydroxyzine  and trazodone  given @ 2051 (Any disturbances and when (visitation, over night)-none (Concerns raised by the patient)- none (SI/HI/AVH)- denies all

## 2024-11-25 NOTE — Progress Notes (Signed)
   11/25/24 1100  Psych Admission Type (Psych Patients Only)  Admission Status Voluntary  Psychosocial Assessment  Patient Complaints Irritability  Eye Contact Brief  Facial Expression Flat  Affect Flat  Speech Logical/coherent  Interaction Assertive  Motor Activity Other (Comment) (WDL)  Appearance/Hygiene Unremarkable  Behavior Characteristics Appropriate to situation  Mood Irritable  Thought Process  Coherency WDL  Content WDL  Delusions None reported or observed  Perception WDL  Hallucination None reported or observed  Judgment Impaired  Confusion None  Danger to Self  Current suicidal ideation? Denies  Danger to Others  Danger to Others None reported or observed   Dar Note: Patient presents with anxious affect and mood.  Denies suicidal thoughts,  auditory and visual hallucinations.  Medications given as prescribed.  Attended group and participated.  Patient is safe on and off the unit.

## 2024-11-25 NOTE — Group Note (Signed)
 Date:  11/25/2024 Time:  7:45 PM  Group Topic/Focus:  Goals Group:   The focus of this group is to help patients establish daily goals to achieve during treatment and discuss how the patient can incorporate goal setting into their daily lives to aide in recovery.    Participation Level:  Did Not Attend   Carlos French 11/25/2024, 7:45 PM

## 2024-11-25 NOTE — [facility] Suicide Risk Assessment (Signed)
 Suicide Risk Assessment  Admission Assessment    [facility] Admission Suicide Risk Assessment   Nursing information obtained from:  Patient Demographic factors:  Male, Caucasian, Unemployed Current Mental Status:  NA Loss Factors:  Financial problems / change in socioeconomic status Historical Factors:  Impulsivity Risk Reduction Factors:  NA  Total Time spent with patient: 1.5 hours Principal Problem: Psychoactive substance-induced psychosis (HCC) Diagnosis:  Principal Problem:   Psychoactive substance-induced psychosis (HCC)  Subjective Data: Carlos French is a 33 year old male with a psychiatric history of MDD, GAD, PTSD, and polysubstance abuse who was admitted to the [facility] following presentation to the [facility] with worsening depressive symptoms and active suicidal ideation, including a stated plan to hang himself using a belt. Chart review indicates he was recently admitted to the Facility-Based Crisis unit in September 2025 for substance use treatment.    Continued Clinical Symptoms:  Alcohol Use Disorder Identification Test Final Score (AUDIT): 2 The Alcohol Use Disorders Identification Test, Guidelines for Use in Primary Care, Second Edition.  World Science Writer [facility]). Score between 0-7:  no or low risk or alcohol related problems. Score between 8-15:  moderate risk of alcohol related problems. Score between 16-19:  high risk of alcohol related problems. Score 20 or above:  warrants further diagnostic evaluation for alcohol dependence and treatment.   CLINICAL FACTORS:   Severe Anxiety and/or Agitation Panic Attacks Depression:   Anhedonia Comorbid alcohol abuse/dependence Hopelessness Insomnia Recent sense of peace/wellbeing Severe Alcohol/Substance Abuse/Dependencies More than one psychiatric diagnosis Unstable or Poor Therapeutic Relationship Previous Psychiatric Diagnoses and  Treatments   Musculoskeletal: Strength & Muscle Tone: within normal limits Gait & Station: normal Patient leans: N/A  Psychiatric Specialty Exam:  Presentation  General Appearance:  Appropriate for Environment  Eye Contact: Good  Speech: Clear and Coherent  Speech Volume: Decreased  Handedness: Right   Mood and Affect  Mood: Anxious; Depressed  Affect: Congruent   Thought Process  Thought Processes: Coherent  Descriptions of Associations:Intact  Orientation:Full (Time, Place and Person)  Thought Content:WDL  History of Schizophrenia/Schizoaffective disorder:No  Duration of Psychotic Symptoms:No data recorded Hallucinations:Hallucinations: None  Ideas of Reference:Paranoia  Suicidal Thoughts:Suicidal Thoughts: No SI Active Intent and/or Plan: -- (Denies)  Homicidal Thoughts:Homicidal Thoughts: No   Sensorium  Memory: Immediate Good; Recent Fair; Remote Fair  Judgment: Impaired  Insight: Poor   Executive Functions  Concentration: Poor  Attention Span: Fair  Recall: [employer] of Knowledge: Fair  Language: Fair   Psychomotor Activity  Psychomotor Activity: Psychomotor Activity: Restlessness   Assets  Assets: Communication Skills; Desire for Improvement   Sleep  Sleep: Sleep: Fair Number of Hours of Sleep: 5    Physical Exam: Physical Exam ROS Blood pressure (!) 140/90, pulse 76, temperature 98.4 F (36.9 C), temperature source Oral, resp. rate 18, height 5' 6 (1.676 m), weight 52.2 kg, SpO2 99%. Body mass index is 18.56 kg/m.   COGNITIVE FEATURES THAT CONTRIBUTE TO RISK:  None    SUICIDE RISK:   Moderate:  Frequent suicidal ideation with limited intensity, and duration, some specificity in terms of plans, no associated intent, good self-control, limited dysphoria/symptomatology, some risk factors present, and identifiable protective factors, including available and accessible social support.  PLAN OF  CARE: See H&P for assessment and plan.   I certify that inpatient services furnished can reasonably be expected to improve the patient's condition.   Blair Chiquita Hint, NP 11/25/2024, 11:59 PM

## 2024-11-25 NOTE — [facility] Suicide Risk Assessment (Signed)
 [facility] INPATIENT:  Family/Significant Other Suicide Prevention Education  Suicide Prevention Education:  Patient Refusal for Family/Significant Other Suicide Prevention Education: The patient Carlos French has refused to provide written consent for family/significant other to be provided Family/Significant Other Suicide Prevention Education during admission and/or prior to discharge.  Physician notified.  Jenkins LULLA Primer 11/25/2024, 2:53 PM

## 2024-11-25 NOTE — H&P (Incomplete)
 Psychiatric Admission Assessment Adult  Patient Identification: Carlos French MRN:  [id number] Date of Evaluation:  11/25/2024 Chief Complaint:  Psychoactive substance-induced psychosis (HCC) [F19.959] Principal Diagnosis: Psychoactive substance-induced psychosis (HCC) Diagnosis:  Principal Problem:   Psychoactive substance-induced psychosis (HCC)  History of Present Illness: Carlos French is a 33 year old male    Evaluation on Unit: The patient       Associated Signs/Symptoms: Depression Symptoms:  {DEPRESSION SYMPTOMS:20000} (Hypo) Manic Symptoms:  {B[facility] MANIC SYMPTOMS:22872} Anxiety Symptoms:  {[facility] ANXIETY SYMPTOMS:22873} Psychotic Symptoms:  Paranoia, PTSD Symptoms: {B[facility] PTSD SYMPTOMS:22875} Total Time spent with patient: 1.5 hours  Past Psychiatric History: ***  Is the patient at risk to self? Yes.    Has the patient been a risk to self in the past 6 months? No.  Has the patient been a risk to self within the distant past? Yes.    Is the patient a risk to others? No.  Has the patient been a risk to others in the past 6 months? No.  Has the patient been a risk to others within the distant past? No.   Columbia Scale:  Flowsheet Row [facility]) [facility] 09/07/2024 in [facility] from 11/12/2023 in [facility] at [facility]  C-SSRS RISK CATEGORY High Risk High Risk No Risk      Alcohol Screening: 1. How often do you have a drink containing alcohol?: Monthly or less 2. How many drinks containing alcohol do you have on a typical day when you are drinking?: 1 or 2 3. How often do you have six or more drinks on one occasion?: Less than monthly AUDIT-C Score: 2 4. How often during the last year have you found that you were not able to stop drinking once you had started?: Never 5. How often during the last year have you  failed to do what was normally expected from you because of drinking?: Never 6. How often during the last year have you needed a first drink in the morning to get yourself going after a heavy drinking session?: Never 7. How often during the last year have you had a feeling of guilt of remorse after drinking?: Never 8. How often during the last year have you been unable to remember what happened the night before because you had been drinking?: Never 9. Have you or someone else been injured as a result of your drinking?: No 10. Has a relative or friend or a doctor or another health worker been concerned about your drinking or suggested you cut down?: No Alcohol Use Disorder Identification Test Final Score (AUDIT): 2 Alcohol Brief Interventions/Follow-up: Alcohol education/Brief advice Substance Abuse History in the last 12 months:  Yes.   Consequences of Substance Abuse: Negative Previous Psychotropic Medications: Yes  Psychological Evaluations: Yes  Past Medical History:  Past Medical History:  Diagnosis Date  . Allergy   . Anxiety   . Bipolar 1 disorder (HCC)   . Depression   . GERD (gastroesophageal reflux disease)   . GI (gastrointestinal bleed)   . GI bleeding   . PTSD (post-traumatic stress disorder)   . Suicidal ideation     Past Surgical History:  Procedure Laterality Date  . NO PAST SURGERIES     Family History:  Family History  Problem Relation Age of Onset  . Stroke Mother    Family Psychiatric  History: *** Tobacco Screening:  Social  History   Tobacco Use  Smoking Status Every Day  . Current packs/day: 1.00  . Types: Cigarettes  Smokeless Tobacco Former    BH Tobacco Counseling     Are you interested in Tobacco Cessation Medications?  No, patient refused Counseled patient on smoking cessation:  Refused/Declined practical counseling Reason Tobacco Screening Not Completed: Patient Refused Screening       Social History:  Social History   Substance and  Sexual Activity  Alcohol Use Yes  . Alcohol/week: 84.0 standard drinks of alcohol  . Types: 84 Cans of beer per week     Social History   Substance and Sexual Activity  Drug Use Yes  . Types: Marijuana, Cocaine   Comment: marijuana daily and crack cocain a few times a week    Additional Social History:                           Allergies:   Allergies  Allergen Reactions  . Tylenol  [Acetaminophen ] Other (See Comments)    Elevated liver enzymes  . Motrin  [Ibuprofen ] Other (See Comments)    Hx GI bleed  . Nsaids Other (See Comments)    Hx GI bleed   Lab Results:  Results for orders placed or performed during the hospital encounter of 11/24/24 (from the past 48 hours)  CBC with Differential/Platelet     Status: None   Collection Time: 11/24/24  1:51 AM  Result Value Ref Range   WBC 7.8 4.0 - 10.5 K/uL   RBC 4.78 4.22 - 5.81 MIL/uL   Hemoglobin 15.8 13.0 - 17.0 g/dL   HCT 54.5 60.9 - 47.9 %   MCV 95.0 80.0 - 100.0 fL   MCH 33.1 26.0 - 34.0 pg   MCHC 34.8 30.0 - 36.0 g/dL   RDW 87.6 88.4 - 84.4 %   Platelets 299 150 - 400 K/uL   nRBC 0.0 0.0 - 0.2 %   Neutrophils Relative % 59 %   Neutro Abs 4.6 1.7 - 7.7 K/uL   Lymphocytes Relative 27 %   Lymphs Abs 2.1 0.7 - 4.0 K/uL   Monocytes Relative 10 %   Monocytes Absolute 0.8 0.1 - 1.0 K/uL   Eosinophils Relative 3 %   Eosinophils Absolute 0.2 0.0 - 0.5 K/uL   Basophils Relative 1 %   Basophils Absolute 0.1 0.0 - 0.1 K/uL   Immature Granulocytes 0 %   Abs Immature Granulocytes 0.03 0.00 - 0.07 K/uL    Comment: Performed at [facility], [address] [place], [place] [address]  Comprehensive metabolic panel     Status: Abnormal   Collection Time: 11/24/24  1:51 AM  Result Value Ref Range   Sodium 135 135 - 145 mmol/L   Potassium 3.2 (L) 3.5 - 5.1 mmol/L   Chloride 96 (L) 98 - 111 mmol/L   CO2 26 22 - 32 mmol/L   Glucose, Bld 67 (L) 70 - 99 mg/dL    Comment: Glucose reference range applies only to  samples taken after fasting for at least 8 hours.   BUN 9 6 - 20 mg/dL   Creatinine, Ser 9.17 0.61 - 1.24 mg/dL   Calcium  9.2 8.9 - 10.3 mg/dL   Total Protein 7.8 6.5 - 8.1 g/dL   Albumin 4.5 3.5 - 5.0 g/dL   AST 21 15 - 41 U/L   ALT 13 0 - 44 U/L   Alkaline Phosphatase 68 38 - 126 U/L  Total Bilirubin 0.6 0.0 - 1.2 mg/dL   GFR, Estimated >39 >39 mL/min    Comment: (NOTE) Calculated using the CKD-EPI Creatinine Equation (2021)    Anion gap 13 5 - 15    Comment: Performed at [facility], [address] [place], [place] [address]  Hemoglobin A1c     Status: None   Collection Time: 11/24/24  1:51 AM  Result Value Ref Range   Hgb A1c MFr Bld 5.1 4.8 - 5.6 %    Comment: (NOTE)         Prediabetes: 5.7 - 6.4         Diabetes: >6.4         Glycemic control for adults with diabetes: <7.0    Mean Plasma Glucose 100 mg/dL    Comment: (NOTE) Performed At: [facility] [address] [place], [place] [id number] Jennette Shorter MD Ey:[id number]   Ethanol     Status: Abnormal   Collection Time: 11/24/24  1:51 AM  Result Value Ref Range   Alcohol, Ethyl (B) 94 (H) <15 mg/dL    Comment: (NOTE) For medical purposes only. Performed at [facility], [address] [place], [place] [address]   Lipid panel     Status: None   Collection Time: 11/24/24  1:51 AM  Result Value Ref Range   Cholesterol 183 0 - 200 mg/dL   Triglycerides 91 <849 mg/dL   HDL 74 >59 mg/dL   Total CHOL/HDL Ratio 2.5 RATIO   VLDL 18 0 - 40 mg/dL   LDL Cholesterol 91 0 - 99 mg/dL    Comment:        Total Cholesterol/HDL:CHD Risk Coronary Heart Disease Risk Table                     Men   Women  1/2 Average Risk   3.4   3.3  Average Risk       5.0   4.4  2 X Average Risk   9.6   7.1  3 X Average Risk  23.4   11.0        Use the calculated Patient Ratio above and the CHD Risk Table to determine the patient's CHD Risk.        ATP III CLASSIFICATION (LDL):  <100     mg/dL   Optimal   899-870  mg/dL   Near or Above                    Optimal  130-159  mg/dL   Borderline  839-810  mg/dL   High  >809     mg/dL   Very High Performed at [facility], [address], [place], [place] [address]   TSH     Status: None   Collection Time: 11/24/24  1:51 AM  Result Value Ref Range   TSH 1.304 0.350 - 4.500 uIU/mL    Comment: Performed by a 3rd Generation assay with a functional sensitivity of <=0.01 uIU/mL. Performed at [facility], [address], [place], [place] [address]   POCT Urine Drug Screen - (I-Screen)     Status: Abnormal   Collection Time: 11/24/24  2:03 AM  Result Value Ref Range   POC Amphetamine UR None Detected NONE DETECTED (Cut Off Level 1000 ng/mL)   POC Secobarbital (BAR) None Detected NONE DETECTED (Cut Off Level 300 ng/mL)   POC Buprenorphine (BUP) None Detected NONE DETECTED (Cut  Off Level 10 ng/mL)   POC Oxazepam (BZO) None Detected NONE DETECTED (Cut Off Level 300 ng/mL)   POC Cocaine UR Positive (A) NONE DETECTED (Cut Off Level 300 ng/mL)   POC Methamphetamine UR None Detected NONE DETECTED (Cut Off Level 1000 ng/mL)   POC Morphine None Detected NONE DETECTED (Cut Off Level 300 ng/mL)   POC Methadone UR None Detected NONE DETECTED (Cut Off Level 300 ng/mL)   POC Oxycodone UR None Detected NONE DETECTED (Cut Off Level 100 ng/mL)   POC Marijuana UR Positive (A) NONE DETECTED (Cut Off Level 50 ng/mL)    Blood Alcohol level:  Lab Results  Component Value Date   ETH 94 (H) 11/24/2024   ETH <15 09/07/2024    Metabolic Disorder Labs:  Lab Results  Component Value Date   HGBA1C 5.1 11/24/2024   MPG 100 11/24/2024   Lab Results  Component Value Date   PROLACTIN 32.3 (H) 02/10/2016   Lab Results  Component Value Date   CHOL 183 11/24/2024   TRIG 91 11/24/2024   HDL 74 11/24/2024   CHOLHDL 2.5 11/24/2024   VLDL 18 11/24/2024   LDLCALC 91 11/24/2024   LDLCALC 99 02/10/2016    Current Medications: Current  Facility-Administered Medications  Medication Dose Route Frequency Provider Last Rate Last Admin  . alum & mag hydroxide-simeth (MAALOX/MYLANTA) 200-200-20 MG/5ML suspension 30 mL  30 mL Oral Q4H PRN Wise, James Benjamin Stallworth, MD      . alum & mag hydroxide-simeth (MAALOX/MYLANTA) 200-200-20 MG/5ML suspension 30 mL  30 mL Oral Q4H PRN Delsie Lynwood Morene Lavone, MD      . haloperidol  (HALDOL ) tablet 5 mg  5 mg Oral TID PRN Delsie Lynwood Morene Lavone, MD       And  . diphenhydrAMINE  (BENADRYL ) capsule 50 mg  50 mg Oral TID PRN Delsie Lynwood Morene Lavone, MD      . haloperidol  lactate (HALDOL ) injection 10 mg  10 mg Intramuscular TID PRN Delsie Lynwood Morene Lavone, MD       And  . diphenhydrAMINE  (BENADRYL ) injection 50 mg  50 mg Intramuscular TID PRN Delsie Lynwood Morene Lavone, MD       And  . LORazepam  (ATIVAN ) injection 2 mg  2 mg Intramuscular TID PRN Delsie Lynwood Morene Lavone, MD      . haloperidol  lactate (HALDOL ) injection 5 mg  5 mg Intramuscular TID PRN Delsie Lynwood Morene Lavone, MD       And  . diphenhydrAMINE  (BENADRYL ) injection 50 mg  50 mg Intramuscular TID PRN Delsie Lynwood Morene Lavone, MD      . hydrOXYzine  (ATARAX ) tablet 25 mg  25 mg Oral TID PRN Delsie Lynwood Morene Lavone, MD   25 mg at 11/24/24 2051  . influenza vac split trivalent PF (FLUZONE) injection 0.5 mL  0.5 mL Intramuscular Tomorrow-1000 Towana Leita SAILOR, MD      . loperamide  (IMODIUM ) capsule 2-4 mg  2-4 mg Oral PRN Delsie Lynwood Morene Lavone, MD      . LORazepam  (ATIVAN ) tablet 1 mg  1 mg Oral Q6H PRN Delsie Lynwood Morene Lavone, MD      . magnesium  hydroxide (MILK OF MAGNESIA) suspension 30 mL  30 mL Oral Daily PRN Delsie Lynwood Morene Lavone, MD      . magnesium  hydroxide (MILK OF MAGNESIA) suspension 30 mL  30 mL Oral Daily PRN Delsie Lynwood Morene Lavone, MD      . multivitamin with minerals tablet 1 tablet  1 tablet Oral  Daily Delsie Lynwood Morene Lavone, MD      . ondansetron  (ZOFRAN -ODT) disintegrating tablet 4 mg  4 mg Oral Q6H PRN Delsie Lynwood Morene Lavone, MD      . pneumococcal 20-valent conjugate vaccine (PREVNAR 20 ) injection 0.5 mL  0.5 mL Intramuscular Tomorrow-1000 Towana Leita SAILOR, MD      . thiamine  (Vitamin B-1) tablet 100 mg  100 mg Oral Daily Delsie Lynwood Morene Lavone, MD      . traZODone  (DESYREL ) tablet 50 mg  50 mg Oral QHS PRN Delsie Lynwood Morene Lavone, MD   50 mg at 11/24/24 2051   PTA Medications: Medications Prior to Admission  Medication Sig Dispense Refill Last Dose/Taking  . hydrOXYzine  (ATARAX ) 25 MG tablet Take 1 tablet (25 mg total) by mouth 3 (three) times daily as needed for anxiety. (Patient not taking: Reported on 11/24/2024) 30 tablet 0   . Multiple Vitamins-Minerals (B COMPLEX-C-E-ZINC ) tablet Take 1 tablet by mouth daily. (Patient not taking: Reported on 11/24/2024) 30 tablet 0   . nicotine  polacrilex (NICORETTE ) 2 MG gum Take 1 each (2 mg total) by mouth as needed for smoking cessation. (Patient not taking: Reported on 11/24/2024) 100 tablet 0   . OLANZapine  (ZYPREXA ) 5 MG tablet Take 1 tablet (5 mg total) by mouth at bedtime. (Patient not taking: Reported on 11/24/2024) 30 tablet 0     AIMS:  ,  ,  ,  ,  ,  ,    Musculoskeletal: Strength & Muscle Tone: within normal limits Gait & Station: normal Patient leans: N/A            Psychiatric Specialty Exam:  Presentation  General Appearance:  Appropriate for Environment  Eye Contact: Good  Speech: Clear and Coherent  Speech Volume: Decreased  Handedness: Right   Mood and Affect  Mood: Anxious; Depressed  Affect: Congruent   Thought Process  Thought Processes: Coherent  Duration of Psychotic Symptoms:Less than 6 months  Past Diagnosis of Schizophrenia or Psychoactive disorder: No  Descriptions of Associations:Intact  Orientation:Full (Time, Place and Person)  Thought  Content:WDL  Hallucinations:Hallucinations: None  Ideas of Reference:Paranoia  Suicidal Thoughts:Suicidal Thoughts: Yes, Active SI Active Intent and/or Plan: With Plan; With Intent  Homicidal Thoughts:Homicidal Thoughts: No   Sensorium  Memory: Immediate Good; Recent Fair; Remote Fair  Judgment: Impaired  Insight: Poor   Executive Functions  Concentration: Poor  Attention Span: Fair  Recall: [employer] of Knowledge: Fair  Language: Fair   Psychomotor Activity  Psychomotor Activity: Psychomotor Activity: Restlessness   Assets  Assets: Communication Skills; Desire for Improvement   Sleep  Sleep: Sleep: Fair  Estimated Sleeping Duration (Last 24 Hours): 12.50-13.00 hours   Physical Exam: Physical Exam Vitals and nursing note reviewed.  Constitutional:      General: He is not in acute distress.    Appearance: He is not ill-appearing.  HENT:     Mouth/Throat:     Pharynx: Oropharynx is clear.  Cardiovascular:     Rate and Rhythm: Normal rate.  Pulmonary:     Effort: No respiratory distress.  Neurological:     Mental Status: He is alert and oriented to person, place, and time.    Review of Systems  Constitutional:  Positive for diaphoresis.  Neurological:  Positive for tremors.       Patient reports history of DTs  Psychiatric/Behavioral:  Positive for depression and substance abuse. Negative for hallucinations and suicidal ideas. The patient is nervous/anxious and has insomnia.  Blood pressure 119/82, pulse 76, temperature 98.4 F (36.9 C), temperature source Oral, resp. rate 18, height 5' 6 (1.676 m), weight 52.2 kg, SpO2 100%. Body mass index is 18.56 kg/m.  Treatment Plan Summary: Daily contact with patient to assess and evaluate symptoms and progress in treatment and Medication management  Diagnoses / Active Problems:                     PLAN: Safety and Monitoring:             -- Voluntary admission to inpatient  psychiatric unit for safety, stabilization and treatment             -- Daily contact with patient to assess and evaluate symptoms and progress in treatment             -- Patient's case to be discussed in multi-disciplinary team meeting             -- Observation Level: q15 minute checks             -- Vital signs:  q12 hours             -- Precautions: suicide, elopement, and assault   2. Psychiatric Diagnoses and Treatment:    # MDD  GAD  -- Restart olanzapine  5 mg oral nightly, mood stability  -- Hydroxyzine  25 mg oral, 3 times daily as needed, anxiety -- Trazodone  50 mg, oral, daily at bedtime as needed, sleep              -- Haldol  BH Agitation Protocol (See MAR)                   3. Medical Issues Being Addressed:        # Nicotine  Dependence  -- Nicotine  14 patch daily  -- Nicorette  Gum 2 mg as needed              -- Smoking cessation encouraged     4. Labs  -- CBC:              -- CMP:              -- Ethanol:              -- Lipid Panel:              -- HgBA1c:              -- UDS:              -- EKG: QT/QTc    -- The risks/benefits/side-effects/alternatives to this medication were discussed in detail with the patient and time was given for questions. The patient consents to medication trial.  -- FDA -- Metabolic profile and EKG monitoring obtained while on an atypical antipsychotic (BMI: Lipid Panel: HbgA1c: QTc:)               -- Encouraged patient to participate in unit milieu and in scheduled group therapies  -- Short Term Goals: Ability to identify changes in lifestyle to reduce recurrence of condition will improve, Ability to verbalize feelings will improve, Ability to disclose and discuss suicidal ideas, Ability to demonstrate self-control will improve, Ability to identify and develop effective coping behaviors will improve, Ability to maintain clinical measurements within normal limits will improve, Compliance with prescribed medications will improve, and  Ability to identify triggers associated with substance abuse/mental health issues will improve             --  Long Term Goals: Improvement in symptoms so as ready for discharge     5. Discharge Planning:  -- Social work and case management to assist with discharge planning and identification of hospital follow-up needs prior to discharge -- Estimated LOS: 5-7 days -- Discharge Concerns: Need to establish a safety plan; Medication compliance and effectiveness -- Discharge Goals: Return home with outpatient referrals for mental health follow-up including medication management/psychotherapy    Physician Treatment Plan for Primary Diagnosis: Psychoactive substance-induced psychosis (HCC) Long Term Goal(s): Improvement in symptoms so as ready for discharge  Short Term Goals: Ability to identify changes in lifestyle to reduce recurrence of condition will improve, Ability to verbalize feelings will improve, Ability to disclose and discuss suicidal ideas, Ability to demonstrate self-control will improve, Ability to identify and develop effective coping behaviors will improve, Ability to maintain clinical measurements within normal limits will improve, Compliance with prescribed medications will improve, and Ability to identify triggers associated with substance abuse/mental health issues will improve   I certify that inpatient services furnished can reasonably be expected to improve the patient's condition.    Blair Chiquita Hint, NP 12/2/20257:25 AM

## 2024-11-25 NOTE — Group Note (Signed)
 Recreation Therapy Group Note   Group Topic:Animal Assisted Therapy   Group Date: 11/25/2024 Start Time: 9048 End Time: 1030 Facilitators: Basilia Stuckert-McCall, LRT,CTRS Location: [address] Dayroom   Animal-Assisted Activity (AAA) Program Checklist/Progress Notes Patient Eligibility Criteria Checklist & Daily Group note for Rec Tx Intervention  AAA/T Program Assumption of Risk Form signed by Patient/ or Parent Legal Guardian Yes  Patient understands his/her participation is voluntary Yes  Behavioral Response:    Education: Charity Fundraiser, Appropriate Animal Interaction   Education Outcome: Acknowledges education.    Affect/Mood: N/A   Participation Level: Did not attend    Clinical Observations/Individualized Feedback:      Plan: Continue to engage patient in RT group sessions 2-3x/week.   Laryah Neuser-McCall, LRT,CTRS 11/25/2024 1:30 PM

## 2024-11-25 NOTE — Group Note (Signed)
 LCSW Group Therapy Note   Group Date: 11/25/2024 Start Time: 1100 End Time: 1200   Participation:  did not attend  Type of Therapy:  Group Therapy   Topic:  Healing From Within: Understanding Our Past, Building Our Future    Objective:  To help participants understand the impact of early experiences on mental and physical health, with a focus on Adverse Childhood Experiences (ACEs), and to explore ways to build resilience and healing.  Group Goals: Understand ACEs and Their Impact: Learn how childhood experiences shape mental and physical health. Build Resilience: Develop strategies for overcoming challenges and creating positive change. Promote Healing: Recognize the value of support and the possibility of healing through therapy and self-care.  Summary: In today's session, we discussed how early experiences, especially ACEs, impact mental and physical health. We explored the effects of stress, abuse, and neglect on brain development and well-being. The group focused on resilience, understanding that healing and positive change are possible with support and self-awareness.  Therapeutic Modalities Used: Psychoeducation: Sharing information about ACEs and their effects. Cognitive Behavioral Therapy (CBT): Helping reframe negative thought patterns. Trauma-Informed Therapy: Creating a safe, supportive space for healing.   Amneet Cendejas O Burgandy Hackworth, LCSWA 11/25/2024  12:26 PM

## 2024-11-25 NOTE — Group Note (Signed)
 Date:  11/25/2024 Time:  11:20 AM  Group Topic/Focus: Pet therapy  Overcoming Stress:   The focus of this group is to define stress and help patients assess their triggers.    Participation Level:  Did Not Attend   Carlos French 11/25/2024, 11:20 AM
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
## 2024-11-25 NOTE — Plan of Care (Signed)
   Problem: Education: Goal: Emotional status will improve Outcome: Not Progressing Goal: Mental status will improve Outcome: Not Progressing

## 2024-11-25 NOTE — Group Note (Signed)
 Date:  11/25/2024 Time:  4:29 PM  Group Topic/Focus:[facility] Coping With Mental Health Crisis:   The purpose of this group is to help patients identify strategies for coping with mental health crisis.  Group discusses possible causes of crisis and ways to manage them effectively.    Participation Level:  Did Not Attend   Carlos French 11/25/2024, 4:29 PM

## 2024-11-25 NOTE — BHH Group Notes (Signed)
 B[facility] Group Notes:  (Nursing/MHT/Case Management/Adjunct)  Date:  11/25/2024  Time:  9:52 PM  Type of Therapy:  Psychoeducational Skills  Participation Level:  Did Not Attend  Participation Quality:  Did Not Attend  Affect:  Did Not Attend  Cognitive:  Did Not Attend  Insight:  None  Engagement in Group:  Did Not Attend  Modes of Intervention:  Did Not Attend  Summary of Progress/Problems: Patient did not attend group.   Kaylen Nghiem S 11/25/2024, 9:52 PM

## 2024-11-25 NOTE — Group Note (Signed)
 Date:  11/25/2024 Time:  3:57 PM  Group Topic/Focus: Social work Patient explaining the meaning of trauma and how to prevent it or when it comes to those circumstances Managing Feelings:   The focus of this group is to identify what feelings patients have difficulty handling and develop a plan to handle them in a healthier way upon discharge. Overcoming Stress:   The focus of this group is to define stress and help patients assess their triggers.    Participation Level:  Did Not Attend  Dolores CHRISTELLA Fredericks 11/25/2024, 3:57 PM

## 2024-11-26 ENCOUNTER — Encounter (HOSPITAL_COMMUNITY): Payer: Self-pay

## 2024-11-26 DIAGNOSIS — F331 Major depressive disorder, recurrent, moderate: Principal | ICD-10-CM | POA: Insufficient documentation

## 2024-11-26 LAB — BASIC METABOLIC PANEL WITH GFR
Anion gap: 7 (ref 5–15)
BUN: 10 mg/dL (ref 6–20)
CO2: 28 mmol/L (ref 22–32)
Calcium: 9.2 mg/dL (ref 8.9–10.3)
Chloride: 104 mmol/L (ref 98–111)
Creatinine, Ser: 0.8 mg/dL (ref 0.61–1.24)
GFR, Estimated: >60 mL/min (ref >60)
Glucose, Bld: 108 mg/dL — ABNORMAL HIGH (ref 70–99)
Potassium: 4.3 mmol/L (ref 3.5–5.1)
Sodium: 139 mmol/L (ref 135–145)

## 2024-11-26 MED ORDER — CLONIDINE HCL 0.1 MG PO TABS
0.1000 mg | ORAL_TABLET | Freq: Four times a day (QID) | ORAL | Status: DC | PRN
  Administered 2024-11-27: 0.1 mg via ORAL
  Filled 2024-11-26: qty 1

## 2024-11-26 MED ORDER — ALBUTEROL SULFATE HFA 108 (90 BASE) MCG/ACT IN AERS: 1.0000 | INHALATION_SPRAY | Freq: Four times a day (QID) | RESPIRATORY_TRACT | Status: DC | PRN

## 2024-11-26 NOTE — Group Note (Signed)
 Date:  11/26/2024 Time:  3:19 PM  Group Topic/Focus:  Spirituality:   The focus of this group is to discuss how one's spirituality can aide in recovery.    Participation Level:  Active   Carlos French 11/26/2024, 3:19 PM

## 2024-11-26 NOTE — Plan of Care (Signed)
 Nurse discussed anxiety, depression and coping skills with patient.

## 2024-11-26 NOTE — Progress Notes (Addendum)
 D:  Patient denied SI and HI, contracts for safety.  Denied A/V hallucinations.  Denied pain. A:  Medications administered per MD orders.  Emotional support and encouragement given patient. R:  Safety maintained with 15 minute checks.

## 2024-11-26 NOTE — Group Note (Signed)
 Date:  11/26/2024 Time:  11:21 AM  Group Topic/Focus:  Recreational Therapy    Participation Level:  Did Not Attend  Logan LITTIE [name] 11/26/2024, 11:21 AM

## 2024-11-26 NOTE — Group Note (Signed)
 Date:  11/26/2024 Time:  4:45 PM  Group Topic/Focus:  Nursing Group     Participation Level:  Active   Ivionna Verley LITTIE [name] 11/26/2024, 4:45 PM

## 2024-11-26 NOTE — Group Note (Signed)
 Recreation Therapy Group Note   Group Topic:Team Building  Group Date: 11/26/2024 Start Time: 0930 End Time: 1002 Facilitators: Layken Beg-McCall, LRT,CTRS Location: [address] Dayroom   Group Topic: Communication, Team Building, Problem Solving  Goal Area(s) Addresses:  Patient will effectively work with peer towards shared goal.  Patient will identify skills used to make activity successful.  Patient will identify how skills used during activity can be used to reach post d/c goals.   Behavioral Response:   Intervention: STEM Activity  Activity: Straw Bridge. In teams of 3-5, patients were given 15 plastic drinking straws and an equal length of masking tape. Using the materials provided, patients were instructed to build a free standing bridge-like structure to suspend an everyday item (ex: puzzle box) off of the floor or table surface. All materials were required to be used by the team in their design. LRT facilitated post-activity discussion reviewing team process. Patients were encouraged to reflect how the skills used in this activity can be generalized to daily life post discharge.   Education: Pharmacist, Community, Scientist, Physiological, Discharge Planning   Education Outcome: Acknowledges education/In group clarification offered/Needs additional education.    Affect/Mood: N/A   Participation Level: Did not attend    Clinical Observations/Individualized Feedback:      Plan: Continue to engage patient in RT group sessions 2-3x/week.   Ifeoluwa Beller-McCall, LRT,CTRS 11/26/2024 1:16 PM

## 2024-11-26 NOTE — Progress Notes (Signed)
(  Sleep Hours) -14.75 (Any PRNs that were needed, meds refused, or side effects to meds)-Atarax   (Any disturbances and when (visitation, over night)-pt was irritable throughout the shift. (Concerns raised by the patient)- none (SI/HI/AVH)-denied

## 2024-11-26 NOTE — Progress Notes (Signed)
 Spirituality group facilitated by Elia Rockie Sofia, BCC.  Group Description: Group focused on topic of hope. Patients participated in facilitated discussion around topic, connecting with one another around experiences and definitions for hope. Group members engaged with visual explorer photos, reflecting on what hope looks like for them today. Group engaged in discussion around how their definitions of hope are present today in hospital.  Modalities: Psycho-social ed, Adlerian, Narrative, MI  Patient Progress: Carlos French attended group. Though verbal participation was minimal, he demonstrated some engagement in the group conversation.

## 2024-11-26 NOTE — Group Note (Signed)
 Date:  11/26/2024 Time:  12:58 PM  Group Topic/Focus:  Pharmacy group     Participation Level:  Did Not Attend  Carlos French [name] 11/26/2024, 12:58 PM

## 2024-11-26 NOTE — Plan of Care (Signed)
  Problem: Education: Goal: Mental status will improve Outcome: Progressing   

## 2024-11-26 NOTE — Progress Notes (Cosign Needed Addendum)
 [facility] MD Progress Note  11/26/2024 3:17 PM Carlos French  MRN:  [id number]  Principal Problem: MDD (major depressive disorder), recurrent episode, moderate (HCC) Diagnosis: Principal Problem:   MDD (major depressive disorder), recurrent episode, moderate (HCC) Active Problems:   Alcohol use disorder, moderate, dependence (HCC)   Tobacco use disorder, severe, dependence   PTSD (post-traumatic stress disorder)   Polysubstance abuse (HCC)   Reason for Admission:  Carlos French is a 33 year old male with a psychiatric history of MDD, GAD, PTSD, and polysubstance abuse who was admitted to the [facility] following presentation to the [facility] with worsening depressive symptoms and active suicidal ideation, including a stated plan to hang himself using a belt. Chart review indicates he was recently admitted to the Facility-Based Crisis unit in September 2025 for substance use treatment.    24-hour Chart Review: Chart reviewed. Patient's case discussed in interdisciplinary team meeting.  Vital signs reviewed without critical value. As needed hydroxyzine  and trazodone  required overnight.  He slept a documented 14.75 hours. He is adherent to taking psychotropic medication regimen. Nursing notes indicate pt was irritable throughout the shift.  CIWA-Ar Total: 4.  Subjective: Carlos French was interviewed in the designated interview room today. He reports feeling "fine" and rates his anxiety as 2-3/10 and his depression as 0/10. He denies suicidal or homicidal ideation, as well as auditory or visual hallucinations and paranoia. He states that his appetite is "wonderful," his concentration is good, and he has not experienced any side effects from his psychiatric medications. He denies withdrawal symptoms or cravings and continues to express interest in substance-abuse treatment, specifically an intensive outpatient program and in-person therapy. Regarding  post-discharge living arrangements, he plans to explore staying with his father in [place] or a friend in [place]. Ras was apologetic and remorseful about an incident the prior evening in which he became agitated and cursed at staff during vital-sign collection. He denies any current cardiac symptoms and has been asking about discharge timing; he was informed that discharge will likely occur after completion of the Librium  taper.  Total Time spent with patient: 45 minutes  Past Psychiatric History: See  H&P   Past Medical History:  Past Medical History:  Diagnosis Date   Allergy    Anxiety    Bipolar 1 disorder (HCC)    Depression    GERD (gastroesophageal reflux disease)    GI (gastrointestinal bleed)    GI bleeding    PTSD (post-traumatic stress disorder)    Suicidal ideation     Past Surgical History:  Procedure Laterality Date   NO PAST SURGERIES     Family History:  Family History  Problem Relation Age of Onset   Stroke Mother    Family Psychiatric  History: See H&P  Social History:  Social History   Substance and Sexual Activity  Alcohol Use Yes   Alcohol/week: 84.0 standard drinks of alcohol   Types: 84 Cans of beer per week     Social History   Substance and Sexual Activity  Drug Use Yes   Types: Marijuana, Cocaine   Comment: marijuana daily and crack cocain a few times a week    Social History   Socioeconomic History   Marital status: Single    Spouse name: Not on file   Number of children: Not on file   Years of education: Not on file   Highest education level: Not on file  Occupational History   Not on  file  Tobacco Use   Smoking status: Every Day    Current packs/day: 1.00    Types: Cigarettes   Smokeless tobacco: Former  Building Services Engineer status: Never Used  Substance and Sexual Activity   Alcohol use: Yes    Alcohol/week: 84.0 standard drinks of alcohol    Types: 84 Cans of beer per week   Drug use: Yes    Types:  Marijuana, Cocaine    Comment: marijuana daily and crack cocain a few times a week   Sexual activity: Yes    Birth control/protection: None  Other Topics Concern   Not on file  Social History Narrative   Not on file   Social Drivers of Health   Financial Resource Strain: Not on file  Food Insecurity: Patient Declined (11/24/2024)   Hunger Vital Sign    Worried About Running Out of Food in the Last Year: Patient declined    Ran Out of Food in the Last Year: Patient declined  Transportation Needs: Patient Declined (11/24/2024)   PRAPARE - Administrator, Civil Service (Medical): Patient declined    Lack of Transportation (Non-Medical): Patient declined  Physical Activity: Not on file  Stress: Not on file  Social Connections: Unknown (05/09/2022)   Received from [employer]   Social Network    Social Network: Not on file   Additional Social History:                          Current Medications: Current Facility-Administered Medications  Medication Dose Route Frequency Provider Last Rate Last Admin   albuterol  (VENTOLIN  HFA) 108 (90 Base) MCG/ACT inhaler 1-2 puff  1-2 puff Inhalation Q6H PRN Blair, Breyton Vanscyoc H, NP       alum & mag hydroxide-simeth (MAALOX/MYLANTA) 200-200-20 MG/5ML suspension 30 mL  30 mL Oral Q4H PRN Delsie Lynwood Morene Lavone, MD       alum & mag hydroxide-simeth (MAALOX/MYLANTA) 200-200-20 MG/5ML suspension 30 mL  30 mL Oral Q4H PRN Delsie Lynwood Morene Lavone, MD       chlordiazePOXIDE  (LIBRIUM ) capsule 25 mg  25 mg Oral Q6H PRN Khaliel Morey H, NP       chlordiazePOXIDE  (LIBRIUM ) capsule 25 mg  25 mg Oral TID Raeshaun Simson H, NP   25 mg at 11/26/24 1251   Followed by   [name] ON 11/27/2024] chlordiazePOXIDE  (LIBRIUM ) capsule 25 mg  25 mg Oral BH-qamhs Naheim Burgen H, NP       Followed by   [name] ON 11/29/2024] chlordiazePOXIDE  (LIBRIUM ) capsule 25 mg  25 mg Oral Daily Latiya Navia H, NP       cloNIDine  (CATAPRES) tablet 0.1 mg  0.1 mg Oral Q6H PRN Abel Hageman H, NP       haloperidol  (HALDOL ) tablet 5 mg  5 mg Oral TID PRN Delsie Lynwood Morene Lavone, MD       And   diphenhydrAMINE  (BENADRYL ) capsule 50 mg  50 mg Oral TID PRN Delsie Lynwood Morene Lavone, MD       haloperidol  lactate (HALDOL ) injection 10 mg  10 mg Intramuscular TID PRN Delsie Lynwood Morene Lavone, MD       And   diphenhydrAMINE  (BENADRYL ) injection 50 mg  50 mg Intramuscular TID PRN Delsie Lynwood Morene Lavone, MD       And   LORazepam  (ATIVAN ) injection 2 mg  2 mg Intramuscular TID PRN Delsie Lynwood Morene Lavone, MD  haloperidol  lactate (HALDOL ) injection 5 mg  5 mg Intramuscular TID PRN Delsie Lynwood Morene Lavone, MD       And   diphenhydrAMINE  (BENADRYL ) injection 50 mg  50 mg Intramuscular TID PRN Delsie Lynwood Morene Lavone, MD       hydrOXYzine  (ATARAX ) tablet 25 mg  25 mg Oral Q6H PRN Blair, Yaiza Palazzola H, NP   25 mg at 11/25/24 2028   influenza vac split trivalent PF (FLUZONE ) injection 0.5 mL  0.5 mL Intramuscular Tomorrow-1000 Towana Leita SAILOR, MD       loperamide  (IMODIUM ) capsule 2-4 mg  2-4 mg Oral PRN Delsie Lynwood Morene Lavone, MD       LORazepam  (ATIVAN ) tablet 1 mg  1 mg Oral Q6H PRN Wise, James Benjamin Stallworth, MD       magnesium  hydroxide (MILK OF MAGNESIA) suspension 30 mL  30 mL Oral Daily PRN Delsie Lynwood Morene Lavone, MD       multivitamin with minerals tablet 1 tablet  1 tablet Oral Daily Delsie Lynwood Morene Lavone, MD   1 tablet at 11/26/24 9177   nicotine  (NICODERM CQ  - dosed in mg/24 hours) patch 14 mg  14 mg Transdermal Daily Maesyn Frisinger H, NP   14 mg at 11/26/24 9178   nicotine  polacrilex (NICORETTE ) gum 2 mg  2 mg Oral PRN Pashayan, Alexander S, DO       OLANZapine  (ZYPREXA ) tablet 5 mg  5 mg Oral QHS Evan Mackie H, NP   5 mg at 11/25/24 2027   ondansetron  (ZOFRAN -ODT) disintegrating tablet 4 mg  4 mg Oral Q6H PRN Delsie Lynwood Morene Lavone, MD       pneumococcal 20-valent conjugate vaccine (PREVNAR 20 ) injection 0.5 mL  0.5 mL Intramuscular Tomorrow-1000 Towana Leita SAILOR, MD       thiamine  (Vitamin B-1) tablet 100 mg  100 mg Oral Daily Delsie Lynwood Morene Lavone, MD   100 mg at 11/26/24 9178   traZODone  (DESYREL ) tablet 50 mg  50 mg Oral QHS PRN Delsie Lynwood Morene Lavone, MD   50 mg at 11/25/24 2138    Lab Results:  Results for orders placed or performed during the hospital encounter of 11/24/24 (from the past 48 hours)  Basic metabolic panel     Status: Abnormal   Collection Time: 11/26/24  6:19 AM  Result Value Ref Range   Sodium 139 135 - 145 mmol/L   Potassium 4.3 3.5 - 5.1 mmol/L   Chloride 104 98 - 111 mmol/L   CO2 28 22 - 32 mmol/L   Glucose, Bld 108 (H) 70 - 99 mg/dL    Comment: Glucose reference range applies only to samples taken after fasting for at least 8 hours.   BUN 10 6 - 20 mg/dL   Creatinine, Ser 9.19 0.61 - 1.24 mg/dL   Calcium  9.2 8.9 - 10.3 mg/dL   GFR, Estimated >39 >39 mL/min    Comment: (NOTE) Calculated using the CKD-EPI Creatinine Equation (2021)    Anion gap 7 5 - 15    Comment: Performed at [facility], [address] [place], [place] [address]    Blood Alcohol level:  Lab Results  Component Value Date   ETH 94 (H) 11/24/2024   ETH <15 09/07/2024    Metabolic Disorder Labs: Lab Results  Component Value Date   HGBA1C 5.1 11/24/2024   MPG 100 11/24/2024   Lab Results  Component Value Date   PROLACTIN 32.3 (H) 02/10/2016   Lab Results  Component Value Date   CHOL 183 11/24/2024   TRIG 91 11/24/2024   HDL 74 11/24/2024   CHOLHDL 2.5 11/24/2024   VLDL 18 11/24/2024   LDLCALC 91 11/24/2024   LDLCALC 99 02/10/2016    Physical Findings: AIMS:  0,  ,  ,  ,  ,  ,   CIWA:  CIWA-Ar Total: 1 COWS:  N/A   Musculoskeletal: Strength & Muscle Tone: within normal limits Gait & Station: normal Patient leans: N/A  Psychiatric  Specialty Exam:  Presentation  General Appearance:  Appropriate for Environment; Casual  Eye Contact: Good  Speech: Clear and Coherent; Normal Rate  Speech Volume: Normal  Handedness: Right   Mood and Affect  Mood: Euthymic (Fine.)  Affect: Congruent   Thought Process  Thought Processes: Coherent; Goal Directed  Descriptions of Associations:Intact  Orientation:Full (Time, Place and Person)  Thought Content:Logical  History of Schizophrenia/Schizoaffective disorder:No  Duration of Psychotic Symptoms:No data recorded Hallucinations:Hallucinations: None  Ideas of Reference:None  Suicidal Thoughts:Suicidal Thoughts: No SI Active Intent and/or Plan: -- (Denies)  Homicidal Thoughts:Homicidal Thoughts: No   Sensorium  Memory: Immediate Good; Recent Good  Judgment: Fair  Insight: Fair   Art Therapist  Concentration: Fair  Attention Span: Fair  Recall: Good  Fund of Knowledge: Fair  Language: Good   Psychomotor Activity  Psychomotor Activity:Psychomotor Activity: Normal   Assets  Assets: Communication Skills; Desire for Improvement; Resilience   Sleep  Sleep:Sleep: Fair    Physical Exam: Physical Exam Vitals and nursing note reviewed.  Constitutional:      General: He is not in acute distress.    Appearance: He is not ill-appearing.  HENT:     Mouth/Throat:     Pharynx: Oropharynx is clear.  Cardiovascular:     Rate and Rhythm: Normal rate.  Pulmonary:     Effort: No respiratory distress.  Musculoskeletal:        General: Normal range of motion.  Skin:    General: Skin is dry.  Neurological:     Mental Status: He is alert and oriented to person, place, and time.    Review of Systems  Psychiatric/Behavioral:  Positive for depression and substance abuse. Negative for hallucinations, memory loss and suicidal ideas. The patient is nervous/anxious and has insomnia.   All other systems reviewed and are  negative.  Blood pressure 130/87, pulse 91, temperature 97.8 F (36.6 C), temperature source Oral, resp. rate 18, height 5' 6 (1.676 m), weight 52.2 kg, SpO2 100%. Body mass index is 18.56 kg/m.   Treatment Plan Summary: Daily contact with patient to assess and evaluate symptoms and progress in treatment and Medication management  Assessment: 33 year old unhoused male with a history of MDD, GAD, PTSD, and polysubstance use who presents with worsening mood symptoms, active withdrawal, significant paranoia, and recent functional decline. Although he denies current suicidal or homicidal ideation, he reports a past suicide attempt within five years, exhibits impaired judgment, and demonstrates ongoing high-risk substance use (daily cocaine, daily marijuana, crack cocaine 2-3 weekly, and heavy alcohol use with last drink two days ago). He endorses paranoia and recent beliefs that others are spying on or following him, though no hallucinations or overt psychosis were observed during interview. His nonadherence to psychiatric medications, unstable housing, lack of supports, and recent conflict leading to being kicked out of his mother's home further increase safety risk. Case discussed with the attending psychiatrist. Treatment plan is to continue the CIWA protocol and initiate a Librium  taper to address alcohol withdrawal  symptoms. Olanzapine  5 mg nightly will be restarted to target mood instability, paranoia, and insomnia. Admission labs were reviewed and show a potassium level of 3.2, for which potassium 40 mEq  1 dose will be administered, with repeat potassium levels ordered for tomorrow morning.   Update 11/26/24: Patient is tolerating his medication regimen without adverse effects. Elevated blood pressure was noted last night (136/106) and again this morning (136/93); he is asymptomatic, and PRN clonidine with parameters will be initiated. He remains on a Librium  taper for detoxification and shows no  signs of withdrawal. Despite a behavioral incident last night, he demonstrates insight and remorse. Continued inpatient hospitalization is recommended to complete the Librium  taper as there is a reported a history of DTs, and support ongoing mood stabilization, as he was originally admitted for worsening depression and active suicidal ideation with a plan. New labs reviewed: Potassium WNL (4.3).     Principal Problem:   MDD (major depressive disorder), recurrent episode, moderate (HCC) Active Problems:   Alcohol use disorder, moderate, dependence (HCC)   Tobacco use disorder, severe, dependence   PTSD (post-traumatic stress disorder)   Polysubstance abuse (HCC)    PLAN: Safety and Monitoring:             -- Voluntary admission to inpatient psychiatric unit for safety, stabilization and treatment             -- Daily contact with patient to assess and evaluate symptoms and progress in treatment             -- Patient's case to be discussed in multi-disciplinary team meeting             -- Observation Level: q15 minute checks             -- Vital signs:  q12 hours             -- Precautions: suicide, elopement, and assault   2. Psychiatric Diagnoses and Treatment:    # MDD  GAD  -- Continue olanzapine  5 mg oral nightly, mood stability  -- Hydroxyzine  25 mg oral, 3 times daily as needed, anxiety -- Trazodone  50 mg, oral, daily at bedtime as needed, sleep              -- Haldol  BH Agitation Protocol (See MAR)                 3. Medical Issues Being Addressed:        # Nicotine  Dependence  -- Nicotine  14 patch daily  -- Nicorette  Gum 2 mg as needed              -- Smoking cessation encouraged               # Alcohol Use Disorder              -- CIWA protocol              -- Librium  taper (See MAR)    4. Labs  -- CBC: Unremarkable             -- CMP: Potassium 3.2, Chloride 96, Blood glucose 67, otherwise WNL (Recheck potassium in the morning).              -- Ethanol: 94              -- Lipid Panel: Unremarkable             -- HgBA1c: WNL             --  TSH: WNL             -- UDS: + Cocaine, THC             -- EKG: NSR  QT/QTc 424/440    -- The risks/benefits/side-effects/alternatives to this medication were discussed in detail with the patient and time was given for questions. The patient consents to medication trial.  -- FDA -- Metabolic profile and EKG monitoring obtained while on an atypical antipsychotic (BMI: Lipid Panel: HbgA1c: QTc:)               -- Encouraged patient to participate in unit milieu and in scheduled group therapies  -- Short Term Goals: Ability to identify changes in lifestyle to reduce recurrence of condition will improve, Ability to verbalize feelings will improve, Ability to disclose and discuss suicidal ideas, Ability to demonstrate self-control will improve, Ability to identify and develop effective coping behaviors will improve, Ability to maintain clinical measurements within normal limits will improve, Compliance with prescribed medications will improve, and Ability to identify triggers associated with substance abuse/mental health issues will improve             -- Long Term Goals: Improvement in symptoms so as ready for discharge     5. Discharge Planning:  -- Social work and case management to assist with discharge planning and identification of hospital follow-up needs prior to discharge -- Estimated LOS: 5-7 days -- Discharge Concerns: Need to establish a safety plan; Medication compliance and effectiveness -- Discharge Goals: Return home with outpatient referrals for mental health follow-up including medication management/psychotherapy      Physician Treatment Plan for Primary Diagnosis: MDD (major depressive disorder), recurrent episode, moderate (HCC) Long Term Goal(s): Improvement in symptoms so as ready for discharge   Short Term Goals: Ability to identify changes in lifestyle to reduce recurrence of condition will  improve, Ability to verbalize feelings will improve, Ability to disclose and discuss suicidal ideas, Ability to demonstrate self-control will improve, Ability to identify and develop effective coping behaviors will improve, Ability to maintain clinical measurements within normal limits will improve, Compliance with prescribed medications will improve, and Ability to identify triggers associated with substance abuse/mental health issues will improve   I certify that inpatient services furnished can reasonably be expected to improve the patient's condition.     Blair Chiquita Hint, NP 11/26/2024, 3:17 PM

## 2024-11-26 NOTE — Group Note (Signed)
 Date:  11/26/2024 Time:  5:25 PM  Group Topic/Focus:  Developing a Wellness Toolbox:   The focus of this group is to help patients develop a wellness toolbox with skills and strategies to promote recovery upon discharge. Sleep Hygeine   Participation Level:  Active  Participation Quality:  Attentive  Affect:  Appropriate  Cognitive:  Alert  Insight: Appropriate  Engagement in Group:  Distracting  Modes of Intervention:  Activity   Carlos French [name] 11/26/2024, 5:25 PM

## 2024-11-26 NOTE — Group Note (Signed)
 Date:  11/26/2024 Time:  10:03 AM  Group Topic/Focus:  Goals Group:   The focus of this group is to help patients establish daily goals to achieve during treatment and discuss how the patient can incorporate goal setting into their daily lives to aide in recovery.    Participation Level:  Did Not Attend

## 2024-11-26 NOTE — [facility] Group Notes (Signed)
 Psychoeducational Group Note  Date:  11/26/2024 Time:  2000  Group Topic/Focus:  Narcotics Anonymous Meeting  Participation Level: Did Not Attend  Participation Quality:  Not Applicable  Affect:  Not Applicable  Cognitive:  Not Applicable  Insight:  Not Applicable  Engagement in Group: Not Applicable  Additional Comments:  Did not attend.   Lenora Manuelita RAMAN 11/26/2024, 9:54 PM

## 2024-11-26 NOTE — Progress Notes (Signed)
 Pt blood pressure has been hight throughout the shift. Writer had MHT recheck blood pressure. Pt stated to MHT, if your going to be taking my blood pressure all fucking night you might as well send me to the hospital. Writer asked pt did he remember that his blood pressure has been elevated throughout the shift, pt stated no. Writer tried educating pt on why his blood pressure was being reassessed, but education was unsuccessful.   Vitals:   11/25/24 2143 11/26/24 0049  BP: (!) 140/90 (!) 136/106  Pulse:  70  Resp:    Temp:    SpO2:

## 2024-11-26 NOTE — BH IP Treatment Plan (Addendum)
 Interdisciplinary Treatment and Diagnostic Plan Update  11/26/2024 Time of Session: 10:10 AM Carlos French MRN: [id number]  Principal Diagnosis: MDD (major depressive disorder), recurrent episode, moderate (HCC)  Secondary Diagnoses: Principal Problem:   MDD (major depressive disorder), recurrent episode, moderate (HCC) Active Problems:   Alcohol use disorder, moderate, dependence (HCC)   Tobacco use disorder, severe, dependence   PTSD (post-traumatic stress disorder)   Polysubstance abuse (HCC)   Current Medications:  Current Facility-Administered Medications  Medication Dose Route Frequency Provider Last Rate Last Admin   albuterol  (VENTOLIN  HFA) 108 (90 Base) MCG/ACT inhaler 1-2 puff  1-2 puff Inhalation Q6H PRN Bennett, Christal H, NP       alum & mag hydroxide-simeth (MAALOX/MYLANTA) 200-200-20 MG/5ML suspension 30 mL  30 mL Oral Q4H PRN Delsie Lynwood Morene Lavone, MD       alum & mag hydroxide-simeth (MAALOX/MYLANTA) 200-200-20 MG/5ML suspension 30 mL  30 mL Oral Q4H PRN Delsie Lynwood Morene Lavone, MD       chlordiazePOXIDE  (LIBRIUM ) capsule 25 mg  25 mg Oral Q6H PRN Blair, Christal H, NP       chlordiazePOXIDE  (LIBRIUM ) capsule 25 mg  25 mg Oral TID Blair, Christal H, NP   25 mg at 11/26/24 1251   Followed by   [name] ON 11/27/2024] chlordiazePOXIDE  (LIBRIUM ) capsule 25 mg  25 mg Oral BH-qamhs Bennett, Christal H, NP       Followed by   [name] ON 11/29/2024] chlordiazePOXIDE  (LIBRIUM ) capsule 25 mg  25 mg Oral Daily Bennett, Christal H, NP       cloNIDine (CATAPRES) tablet 0.1 mg  0.1 mg Oral Q6H PRN Bennett, Christal H, NP       haloperidol  (HALDOL ) tablet 5 mg  5 mg Oral TID PRN Delsie Lynwood Morene Lavone, MD       And   diphenhydrAMINE  (BENADRYL ) capsule 50 mg  50 mg Oral TID PRN Delsie Lynwood Morene Lavone, MD       haloperidol  lactate (HALDOL ) injection 10 mg  10 mg Intramuscular TID PRN Delsie Lynwood Morene Lavone, MD       And    diphenhydrAMINE  (BENADRYL ) injection 50 mg  50 mg Intramuscular TID PRN Delsie Lynwood Morene Lavone, MD       And   LORazepam  (ATIVAN ) injection 2 mg  2 mg Intramuscular TID PRN Delsie Lynwood Morene Lavone, MD       haloperidol  lactate (HALDOL ) injection 5 mg  5 mg Intramuscular TID PRN Delsie Lynwood Morene Lavone, MD       And   diphenhydrAMINE  (BENADRYL ) injection 50 mg  50 mg Intramuscular TID PRN Delsie Lynwood Morene Lavone, MD       hydrOXYzine  (ATARAX ) tablet 25 mg  25 mg Oral Q6H PRN Bennett, Christal H, NP   25 mg at 11/25/24 2028   influenza vac split trivalent PF (FLUZONE) injection 0.5 mL  0.5 mL Intramuscular Tomorrow-1000 Towana Leita SAILOR, MD       loperamide  (IMODIUM ) capsule 2-4 mg  2-4 mg Oral PRN Delsie Lynwood Morene Lavone, MD       LORazepam  (ATIVAN ) tablet 1 mg  1 mg Oral Q6H PRN Delsie Lynwood Morene Lavone, MD       magnesium  hydroxide (MILK OF MAGNESIA) suspension 30 mL  30 mL Oral Daily PRN Delsie Lynwood Morene Lavone, MD       multivitamin with minerals tablet 1 tablet  1 tablet Oral Daily Delsie Lynwood Morene Lavone, MD   1 tablet at 11/26/24 [id number]  nicotine  (NICODERM CQ  - dosed in mg/24 hours) patch 14 mg  14 mg Transdermal Daily Bennett, Christal H, NP   14 mg at 11/26/24 9178   nicotine  polacrilex (NICORETTE ) gum 2 mg  2 mg Oral PRN Pashayan, Alexander S, DO       OLANZapine  (ZYPREXA ) tablet 5 mg  5 mg Oral QHS Bennett, Christal H, NP   5 mg at 11/25/24 2027   ondansetron  (ZOFRAN -ODT) disintegrating tablet 4 mg  4 mg Oral Q6H PRN Delsie Lynwood Morene Lavone, MD       pneumococcal 20-valent conjugate vaccine (PREVNAR 20) injection 0.5 mL  0.5 mL Intramuscular Tomorrow-1000 Towana Leita SAILOR, MD       thiamine  (Vitamin B-1) tablet 100 mg  100 mg Oral Daily Delsie Lynwood Morene Lavone, MD   100 mg at 11/26/24 9178   traZODone  (DESYREL ) tablet 50 mg  50 mg Oral QHS PRN Delsie Lynwood Morene Lavone, MD   50 mg at 11/25/24 2138   PTA  Medications: Medications Prior to Admission  Medication Sig Dispense Refill Last Dose/Taking   hydrOXYzine  (ATARAX ) 25 MG tablet Take 1 tablet (25 mg total) by mouth 3 (three) times daily as needed for anxiety. (Patient not taking: Reported on 11/24/2024) 30 tablet 0    Multiple Vitamins-Minerals (B COMPLEX-C-E-ZINC ) tablet Take 1 tablet by mouth daily. (Patient not taking: Reported on 11/24/2024) 30 tablet 0    nicotine  polacrilex (NICORETTE ) 2 MG gum Take 1 each (2 mg total) by mouth as needed for smoking cessation. (Patient not taking: Reported on 11/24/2024) 100 tablet 0    OLANZapine  (ZYPREXA ) 5 MG tablet Take 1 tablet (5 mg total) by mouth at bedtime. (Patient not taking: Reported on 11/24/2024) 30 tablet 0     Patient Stressors: Financial difficulties   Health problems   Marital or family conflict   Substance abuse    Patient Strengths: Ability for Contractor for treatment/growth   Treatment Modalities: Medication Management, Group therapy, Case management,  1 to 1 session with clinician, Psychoeducation, Recreational therapy.   Physician Treatment Plan for Primary Diagnosis: MDD (major depressive disorder), recurrent episode, moderate (HCC) Long Term Goal(s): Improvement in symptoms so as ready for discharge   Short Term Goals: Ability to identify changes in lifestyle to reduce recurrence of condition will improve Ability to verbalize feelings will improve Ability to disclose and discuss suicidal ideas Ability to demonstrate self-control will improve Ability to identify and develop effective coping behaviors will improve Ability to maintain clinical measurements within normal limits will improve Compliance with prescribed medications will improve Ability to identify triggers associated with substance abuse/mental health issues will improve  Medication Management: Evaluate patient's response, side effects, and tolerance of medication  regimen.  Therapeutic Interventions: 1 to 1 sessions, Unit Group sessions and Medication administration.  Evaluation of Outcomes: Not Progressing  Physician Treatment Plan for Secondary Diagnosis: Principal Problem:   MDD (major depressive disorder), recurrent episode, moderate (HCC) Active Problems:   Alcohol use disorder, moderate, dependence (HCC)   Tobacco use disorder, severe, dependence   PTSD (post-traumatic stress disorder)   Polysubstance abuse (HCC)  Long Term Goal(s): Improvement in symptoms so as ready for discharge   Short Term Goals: Ability to identify changes in lifestyle to reduce recurrence of condition will improve Ability to verbalize feelings will improve Ability to disclose and discuss suicidal ideas Ability to demonstrate self-control will improve Ability to identify and develop effective coping behaviors will improve Ability to maintain clinical measurements  within normal limits will improve Compliance with prescribed medications will improve Ability to identify triggers associated with substance abuse/mental health issues will improve     Medication Management: Evaluate patient's response, side effects, and tolerance of medication regimen.  Therapeutic Interventions: 1 to 1 sessions, Unit Group sessions and Medication administration.  Evaluation of Outcomes: Not Progressing   RN Treatment Plan for Primary Diagnosis: MDD (major depressive disorder), recurrent episode, moderate (HCC) Long Term Goal(s): Knowledge of disease and therapeutic regimen to maintain health will improve  Short Term Goals: Ability to remain free from injury will improve, Ability to verbalize frustration and anger appropriately will improve, Ability to demonstrate self-control, Ability to participate in decision making will improve, Ability to verbalize feelings will improve, Ability to disclose and discuss suicidal ideas, Ability to identify and develop effective coping behaviors will  improve, and Compliance with prescribed medications will improve  Medication Management: RN will administer medications as ordered by provider, will assess and evaluate patient's response and provide education to patient for prescribed medication. RN will report any adverse and/or side effects to prescribing provider.  Therapeutic Interventions: 1 on 1 counseling sessions, Psychoeducation, Medication administration, Evaluate responses to treatment, Monitor vital signs and CBGs as ordered, Perform/monitor CIWA, COWS, AIMS and Fall Risk screenings as ordered, Perform wound care treatments as ordered.  Evaluation of Outcomes: Not Progressing   LCSW Treatment Plan for Primary Diagnosis: MDD (major depressive disorder), recurrent episode, moderate (HCC) Long Term Goal(s): Safe transition to appropriate next level of care at discharge, Engage patient in therapeutic group addressing interpersonal concerns.  Short Term Goals: Engage patient in aftercare planning with referrals and resources, Increase social support, Increase ability to appropriately verbalize feelings, Increase emotional regulation, Facilitate acceptance of mental health diagnosis and concerns, Facilitate patient progression through stages of change regarding substance use diagnoses and concerns, Identify triggers associated with mental health/substance abuse issues, and Increase skills for wellness and recovery  Therapeutic Interventions: Assess for all discharge needs, 1 to 1 time with Social worker, Explore available resources and support systems, Assess for adequacy in community support network, Educate family and significant other(s) on suicide prevention, Complete Psychosocial Assessment, Interpersonal group therapy.  Evaluation of Outcomes: Not Progressing   Progress in Treatment: Attending groups: No. Participating in groups: No. Taking medication as prescribed: Yes. Toleration medication: Yes. Family/Significant other  contact made: No, will contact:  patient declined to provide consents at this time Patient understands diagnosis: Yes. Discussing patient identified problems/goals with staff: Yes. Medical problems stabilized or resolved: Yes. Denies suicidal/homicidal ideation: Yes. Issues/concerns per patient self-inventory: No.  New problem(s) identified:  No  New Short Term/Long Term Goal(s):    medication stabilization, elimination of SI thoughts, development of comprehensive mental wellness plan.    Patient Goals:  I want to go home.  Discharge Plan or Barriers:  Patient recently admitted. CSW will continue to follow and assess for appropriate referrals and possible discharge planning.    Reason for Continuation of Hospitalization: Hallucinations Medication stabilization Suicidal ideation  Paranoia  Estimated Length of Stay:  5 - 7 days  Last 3 [place] Suicide Severity Risk Score: Flowsheet Row Ad[facility] (Current) [facility] from 09/07/2024 in [facility] Department at [facility] ED from 11/12/2023 in [facility] Emergency Department [facility]  C-SSRS RISK CATEGORY High Risk High Risk No Risk    Last [facility] 2/9 Scores:     No data to display  Scribe for Treatment Team: Atina Feeley O Magdalina Whitehead, LCSWA 11/26/2024 2:20 PM

## 2024-11-27 DIAGNOSIS — F331 Major depressive disorder, recurrent, moderate: Principal | ICD-10-CM

## 2024-11-27 DIAGNOSIS — F4310 Post-traumatic stress disorder, unspecified: Secondary | ICD-10-CM

## 2024-11-27 DIAGNOSIS — F17210 Nicotine dependence, cigarettes, uncomplicated: Secondary | ICD-10-CM

## 2024-11-27 DIAGNOSIS — F1020 Alcohol dependence, uncomplicated: Secondary | ICD-10-CM

## 2024-11-27 DIAGNOSIS — F1910 Other psychoactive substance abuse, uncomplicated: Secondary | ICD-10-CM

## 2024-11-27 MED ORDER — LISINOPRIL 5 MG PO TABS
10.0000 mg | ORAL_TABLET | Freq: Once | ORAL | Status: AC
  Administered 2024-11-27: 10 mg via ORAL
  Filled 2024-11-27: qty 2

## 2024-11-27 MED ORDER — LISINOPRIL 5 MG PO TABS
5.0000 mg | ORAL_TABLET | Freq: Every day | ORAL | Status: DC
  Administered 2024-11-28 – 2024-11-29 (×2): 5 mg via ORAL
  Filled 2024-11-27 (×2): qty 1
  Filled 2024-11-27: qty 7

## 2024-11-27 NOTE — [facility] Group Notes (Signed)
 Adult Psychoeducational Group Note  Date:  11/27/2024 Time:  8:28 PM  Group Topic/Focus:  Wrap-Up Group:   The focus of this group is to help patients review their daily goal of treatment and discuss progress on daily workbooks.  Participation Level:  Active  Participation Quality:  Appropriate  Affect:  Appropriate  Cognitive:  Appropriate  Insight: Appropriate  Engagement in Group:  Engaged  Modes of Intervention:  Discussion  Additional Comments:  Cabell said his day was 6. He feel better than he has since he been here  Lang Drilling Long 11/27/2024, 8:28 PM

## 2024-11-27 NOTE — Progress Notes (Signed)
(  Sleep Hours) - 14.75 (Any PRNs that were needed, meds refused, or side effects to meds)- trazodone , hydroxyzine  (Any disturbances and when (visitation, over night)- none (Concerns raised by the patient)- none (SI/HI/AVH)- denies

## 2024-11-27 NOTE — Progress Notes (Signed)
 [facility] MD Progress Note  11/27/2024 10:49 AM Carlos French  MRN:  [id number]  Reason for admission: 33 year old male with a psychiatric history of MDD, GAD, PTSD, and polysubstance abuse who was admitted to the [facility] following presentation to the [facility] with worsening depressive symptoms and active suicidal ideation, including a stated plan to hang himself using a belt. Chart review indicates he was recently admitted to the Facility-Based Crisis unit in September 2025 for substance use treatment.   Daily notes: Carlos French is seen this morning. Chart reviewed. The chart findings discussed with the treatment team. He presents alert, oriented & aware of situation. He is visible on the unit. However, he presents this morning highly angry & irritated. He reports, I have been in this place for 3 days due to stupidity. It was other people's stupidity that brought me here. I do not need to be here. I do not know what is going on or why I should be here. I want to go home. I'm ready for discharge. I'm not depressed or anxious. I'm angry because you all are not telling me what you are doing with me. One of the staff came this morning & put a yellow band on my wrist & gave me a pair of yellow socks because you all feel that I'm a fall risk. Why am I even at fall risks? The nurse also informed me that my blood pressure is real high, why? I do not know. I have never had any problems with my blood pressure & why now? I need to see my real doctor. He needs to tell me why I'm still here. Although this provider tried to explain to patient that his blood pressure is elevated & he is going to be started on antihypertensive for probably a short period of time until his blood pressure normalizes. Patient stormed out to go look for his psychiatrist. He loudly expressed that he is not depressed or anxious. He denies any SIHI, AVH, delusional thoughts or paranoia. He does not  appear to be responding to any internal stimuli. Patient is however, started on lisinopril for the elevated blood pressure. Patient is being monitored for safety.  Principal Problem: MDD (major depressive disorder), recurrent episode, moderate (HCC)  Diagnosis: Principal Problem:   MDD (major depressive disorder), recurrent episode, moderate (HCC) Active Problems:   Alcohol use disorder, moderate, dependence (HCC)   Tobacco use disorder, severe, dependence   PTSD (post-traumatic stress disorder)   Polysubstance abuse (HCC)  Total Time spent with patient: 45 minutes  Past Psychiatric History: See H&P.  Past Medical History:  Past Medical History:  Diagnosis Date   Allergy    Anxiety    Bipolar 1 disorder (HCC)    Depression    GERD (gastroesophageal reflux disease)    GI (gastrointestinal bleed)    GI bleeding    PTSD (post-traumatic stress disorder)    Suicidal ideation     Past Surgical History:  Procedure Laterality Date   NO PAST SURGERIES     Family History:  Family History  Problem Relation Age of Onset   Stroke Mother    Family Psychiatric  History: See H&P.  Social History:  Social History   Substance and Sexual Activity  Alcohol Use Yes   Alcohol/week: 84.0 standard drinks of alcohol   Types: 84 Cans of beer per week     Social History   Substance and Sexual Activity  Drug Use Yes  Types: Marijuana, Cocaine   Comment: marijuana daily and crack cocain a few times a week    Social History   Socioeconomic History   Marital status: Single    Spouse name: Not on file   Number of children: Not on file   Years of education: Not on file   Highest education level: Not on file  Occupational History   Not on file  Tobacco Use   Smoking status: Every Day    Current packs/day: 1.00    Types: Cigarettes   Smokeless tobacco: Former  Advertising Account Planner   Vaping status: Never Used  Substance and Sexual Activity   Alcohol use: Yes    Alcohol/week: 84.0  standard drinks of alcohol    Types: 84 Cans of beer per week   Drug use: Yes    Types: Marijuana, Cocaine    Comment: marijuana daily and crack cocain a few times a week   Sexual activity: Yes    Birth control/protection: None  Other Topics Concern   Not on file  Social History Narrative   Not on file   Social Drivers of Health   Financial Resource Strain: Not on file  Food Insecurity: Patient Declined (11/24/2024)   Hunger Vital Sign    Worried About Running Out of Food in the Last Year: Patient declined    Ran Out of Food in the Last Year: Patient declined  Transportation Needs: Patient Declined (11/24/2024)   PRAPARE - Administrator, Civil Service (Medical): Patient declined    Lack of Transportation (Non-Medical): Patient declined  Physical Activity: Not on file  Stress: Not on file  Social Connections: Unknown (05/09/2022)   Received from [employer]   Social Network    Social Network: Not on file   Additional Social History:   Sleep: Good Estimated Sleeping Duration (Last 24 Hours): 8.25-9.75 hours  Appetite:  Good  Current Medications: Current Facility-Administered Medications  Medication Dose Route Frequency Provider Last Rate Last Admin   albuterol  (VENTOLIN  HFA) 108 (90 Base) MCG/ACT inhaler 1-2 puff  1-2 puff Inhalation Q6H PRN Bennett, Christal H, NP       alum & mag hydroxide-simeth (MAALOX/MYLANTA) 200-200-20 MG/5ML suspension 30 mL  30 mL Oral Q4H PRN Wise, James Benjamin Stallworth, MD       alum & mag hydroxide-simeth (MAALOX/MYLANTA) 200-200-20 MG/5ML suspension 30 mL  30 mL Oral Q4H PRN Delsie Lynwood Morene Lavone, MD       chlordiazePOXIDE  (LIBRIUM ) capsule 25 mg  25 mg Oral Q6H PRN Bennett, Christal H, NP       chlordiazePOXIDE  (LIBRIUM ) capsule 25 mg  25 mg Oral BH-qamhs Bennett, Christal H, NP       Followed by   [name] ON 11/29/2024] chlordiazePOXIDE  (LIBRIUM ) capsule 25 mg  25 mg Oral Daily Bennett, Christal H, NP       cloNIDine  (CATAPRES) tablet 0.1 mg  0.1 mg Oral Q6H PRN Bennett, Christal H, NP   0.1 mg at 11/27/24 0836   haloperidol  (HALDOL ) tablet 5 mg  5 mg Oral TID PRN Delsie Lynwood Morene Lavone, MD       And   diphenhydrAMINE  (BENADRYL ) capsule 50 mg  50 mg Oral TID PRN Delsie Lynwood Morene Lavone, MD       haloperidol  lactate (HALDOL ) injection 10 mg  10 mg Intramuscular TID PRN Delsie Lynwood Morene Lavone, MD       And   diphenhydrAMINE  (BENADRYL ) injection 50 mg  50 mg Intramuscular TID PRN  Delsie Lynwood Morene Lavone, MD       And   LORazepam  (ATIVAN ) injection 2 mg  2 mg Intramuscular TID PRN Delsie Lynwood Morene Lavone, MD       haloperidol  lactate (HALDOL ) injection 5 mg  5 mg Intramuscular TID PRN Delsie Lynwood Morene Lavone, MD       And   diphenhydrAMINE  (BENADRYL ) injection 50 mg  50 mg Intramuscular TID PRN Delsie Lynwood Morene Lavone, MD       hydrOXYzine  (ATARAX ) tablet 25 mg  25 mg Oral Q6H PRN Blair, Christal H, NP   25 mg at 11/26/24 2139   influenza vac split trivalent PF (FLUZONE) injection 0.5 mL  0.5 mL Intramuscular Tomorrow-1000 Towana Leita SAILOR, MD       magnesium  hydroxide (MILK OF MAGNESIA) suspension 30 mL  30 mL Oral Daily PRN Delsie Lynwood Morene Lavone, MD       multivitamin with minerals tablet 1 tablet  1 tablet Oral Daily Delsie Lynwood Morene Lavone, MD   1 tablet at 11/27/24 9163   nicotine  (NICODERM CQ  - dosed in mg/24 hours) patch 14 mg  14 mg Transdermal Daily Bennett, Christal H, NP   14 mg at 11/26/24 9178   nicotine  polacrilex (NICORETTE ) gum 2 mg  2 mg Oral PRN Pashayan, Alexander S, DO   2 mg at 11/26/24 2149   OLANZapine  (ZYPREXA ) tablet 5 mg  5 mg Oral QHS Bennett, Christal H, NP   5 mg at 11/26/24 2139   pneumococcal 20-valent conjugate vaccine (PREVNAR 20) injection 0.5 mL  0.5 mL Intramuscular Tomorrow-1000 Towana Leita SAILOR, MD       thiamine  (Vitamin B-1) tablet 100 mg  100 mg Oral Daily Delsie Lynwood Morene Lavone, MD   100  mg at 11/27/24 9163   traZODone  (DESYREL ) tablet 50 mg  50 mg Oral QHS PRN Delsie Lynwood Morene Lavone, MD   50 mg at 11/26/24 2139   Lab Results:  Results for orders placed or performed during the hospital encounter of 11/24/24 (from the past 48 hours)  Basic metabolic panel     Status: Abnormal   Collection Time: 11/26/24  6:19 AM  Result Value Ref Range   Sodium 139 135 - 145 mmol/L   Potassium 4.3 3.5 - 5.1 mmol/L   Chloride 104 98 - 111 mmol/L   CO2 28 22 - 32 mmol/L   Glucose, Bld 108 (H) 70 - 99 mg/dL    Comment: Glucose reference range applies only to samples taken after fasting for at least 8 hours.   BUN 10 6 - 20 mg/dL   Creatinine, Ser 9.19 0.61 - 1.24 mg/dL   Calcium  9.2 8.9 - 10.3 mg/dL   GFR, Estimated >39 >39 mL/min    Comment: (NOTE) Calculated using the CKD-EPI Creatinine Equation (2021)    Anion gap 7 5 - 15    Comment: Performed at [facility], [address] [place], [place] [address]   Blood Alcohol level:  Lab Results  Component Value Date   ETH 94 (H) 11/24/2024   ETH <15 09/07/2024   Metabolic Disorder Labs: Lab Results  Component Value Date   HGBA1C 5.1 11/24/2024   MPG 100 11/24/2024   Lab Results  Component Value Date   PROLACTIN 32.3 (H) 02/10/2016   Lab Results  Component Value Date   CHOL 183 11/24/2024   TRIG 91 11/24/2024   HDL 74 11/24/2024   CHOLHDL 2.5 11/24/2024   VLDL 18 11/24/2024  LDLCALC 91 11/24/2024   LDLCALC 99 02/10/2016   Physical Findings: AIMS:  ,  ,  ,  ,  ,  ,   CIWA:  CIWA-Ar Total: 0 COWS:     Musculoskeletal: Strength & Muscle Tone: within normal limits Gait & Station: normal Patient leans: N/A  Psychiatric Specialty Exam:  Presentation  General Appearance:  Appropriate for Environment; Casual  Eye Contact: Good  Speech: Clear and Coherent; Normal Rate  Speech Volume: Normal  Handedness: Right   Mood and Affect  Mood: Euthymic  (Fine.)  Affect: Congruent   Thought Process  Thought Processes: Coherent; Goal Directed  Descriptions of Associations:Intact  Orientation:Full (Time, Place and Person)  Thought Content:Logical  History of Schizophrenia/Schizoaffective disorder:No  Duration of Psychotic Symptoms: NA Hallucinations:Hallucinations: None  Ideas of Reference:None  Suicidal Thoughts:Suicidal Thoughts: No SI Active Intent and/or Plan: -- (Denies)  Homicidal Thoughts:Homicidal Thoughts: No   Sensorium  Memory: Immediate Good; Recent Good  Judgment: Fair  Insight: Fair   Art Therapist  Concentration: Fair  Attention Span: Fair  Recall: Good  Fund of Knowledge: Fair  Language: Good   Psychomotor Activity  Psychomotor Activity: Psychomotor Activity: Normal   Assets  Assets: Communication Skills; Desire for Improvement; Resilience  Sleep  Sleep: Sleep: Fair   Physical Exam: Physical Exam Vitals and nursing note reviewed.  HENT:     Head: Normocephalic.     Nose: Nose normal.     Mouth/Throat:     Pharynx: Oropharynx is clear.  Cardiovascular:     Pulses: Normal pulses.  Pulmonary:     Effort: Pulmonary effort is normal.  Genitourinary:    Comments: Deferred. Musculoskeletal:        General: Normal range of motion.     Cervical back: Normal range of motion.  Skin:    General: Skin is dry.  Neurological:     General: No focal deficit present.     Mental Status: He is alert and oriented to person, place, and time.    Review of Systems  Constitutional:  Negative for chills, diaphoresis and fever.  HENT:  Negative for congestion and sore throat.   Respiratory:  Negative for cough, shortness of breath and wheezing.   Cardiovascular:  Negative for chest pain and palpitations.  Gastrointestinal:  Negative for abdominal pain, constipation, diarrhea, heartburn, nausea and vomiting.  Genitourinary:  Negative for dysuria.  Musculoskeletal:   Negative for joint pain and myalgias.  Neurological:  Negative for dizziness, tingling, tremors, sensory change, speech change, focal weakness, seizures, loss of consciousness, weakness and headaches.  Endo/Heme/Allergies:        Allergies: Tyleno, NSAIDS )Motrin ).  Psychiatric/Behavioral:  Positive for substance abuse. Negative for suicidal ideas. The patient is nervous/anxious and has insomnia.    Blood pressure (!) 139/103, pulse 92, temperature (!) 97.4 F (36.3 C), temperature source Oral, resp. rate 18, height 5' 6 (1.676 m), weight 52.2 kg, SpO2 100%. Body mass index is 18.56 kg/m.  Treatment Plan Summary: Daily contact with patient to assess and evaluate symptoms and progress in treatment and Medication management.   Continue inpatient hospitalization.  Will continue today 11/27/2024 plan as below except where it is noted.   2. Psychiatric Diagnoses and Treatment:    # MDD  GAD  -- Continue Olanzapine  5 mg oral nightly, mood stability  -- Hydroxyzine  25 mg oral, 3 times daily as needed, anxiety -- Trazodone  50 mg, oral, daily at bedtime as needed, sleep              --  Haldol  BH Agitation Protocol (See MAR).              3. Medical Issues Being Addressed:        # Nicotine  Dependence  -- Nicotine  14 patch daily  -- Nicorette  Gum 2 mg as needed              -- Smoking cessation encouraged.              -- Initiated Lisinopril 10 mg po once for HTN.              -- Continue Lisinopril 5 mg po daily starting 11-28-24.               # Alcohol Use Disorder              -- CIWA protocol              -- Librium  taper (See MAR)    4. Labs  -- CBC: Unremarkable             -- CMP: Potassium 3.2, Chloride 96, Blood glucose 67, otherwise WNL (Recheck potassium in the morning).              -- Ethanol: 94             -- Lipid Panel: Unremarkable             -- HgBA1c: WNL             -- TSH: WNL             -- UDS: + Cocaine, THC             -- EKG: NSR  QT/QTc 424/440    -- FDA therapies  -- Short Term Goals: Ability to identify changes in lifestyle to reduce recurrence of condition will improve, Ability to verbalize feelings will improve, Ability to disclose and discuss suicidal ideas, Ability to demonstrate self-control will improve, Ability to identify and develop effective coping behaviors will improve, Ability to maintain clinical measurements within normal limits will improve, Compliance with prescribed medications will improve, and Ability to identify triggers associated with substance abuse/mental health issues will improve             -- Long Term Goals: Improvement in symptoms so as ready for discharge     5. Discharge Planning:  -- Social work and case management to assist with discharge planning and identification of hospital follow-up needs prior to discharge -- Estimated LOS: 5-7 days -- Discharge Concerns: Need to establish a safety plan; Medication compliance and effectiveness -- Discharge Goals: Return home with outpatient referrals for mental health follow-up including medication management/psychotherapy     Mac Bolster, NP, pmhnp, fnp-bc. 11/27/2024, 10:49 AM

## 2024-11-27 NOTE — Group Note (Signed)
 Date:  11/27/2024 Time:  7:12 PM  Group Topic/Focus: Emotional Wellness  Emotional Education:   The focus of this group is to discuss what feelings/emotions are, and how they are experienced.   The group began with an emotional check-in and pulse check as an research scientist (life sciences). Patients then participated in an Emotion Bingo activity, where they were provided bingo cards listing various emotions. Emotions were called at random, and patients selected emotions to discuss how each feeling affects them and the actions or behaviors linked to that emotion. A gold star was awarded for bingo completion, and prompting questions such as "Have you ever experienced this emotion?" were used to encourage reflection and sharing. The group discussion highlighted the universality of emotions, the differences in how they may appear, and the importance of giving grace to others. In conclusion, patients were reminded that naming emotions increases awareness and provides the power to control how emotions are expressed.   Participation Level:  Did Not Attend   Tinnie LOISE Jim 11/27/2024, 7:12 PM

## 2024-11-27 NOTE — Group Note (Signed)
 LCSW Group Therapy Note    Group Date: 11/27/2024 Start Time: 1100 End Time: 1200   Participation:  did not attend  Type of Therapy:  Group Therapy  Topic:  Money Matters: Ecologist, Confidence and Peace of Mind  Objective: To help participants understand the impact of financial stability on well-being through the lens of Maslow's Hierarchy of Needs and develop practical strategies for budgeting, saving, and debt repayment.  Goals: Increase awareness of spending habits and financial priorities, recognizing how money supports basic needs, security, and relationships. Develop simple budgeting and saving strategies to enhance stability and peace of mind.  Reduce financial stress by creating a realistic debt repayment plan, supporting long-term confidence and well-being.  Summary:  Participants explored how financial stability connects to basic needs, relationships, and self-esteem using Maslow's Hierarchy. They discussed budgeting, saving, and debt repayment strategies, identifying small, manageable changes. Through interactive discussion and self-reflection, they gained insight into their financial habits and created personal action steps for improvement.  Therapeutic Modalities Used: Elements of Cognitive Behavioral Therapy (CBT) - Addressing financial stress and thought patterns. Psychoeducation - Engineer, agricultural. Elements of Motivational Interviewing (MI) - Encouraging realistic, achievable changes. Group Support - Reducing shame and stress through shared experiences.   Seynabou Fults O Demerius Podolak, LCSWA 11/27/2024  12:28 PM

## 2024-11-27 NOTE — Group Note (Signed)
 Date:  11/27/2024 Time:  4:17 PM  Group Topic/Focus: Occupational Therapy Healthy Communication:   The focus of this group is to discuss communication, barriers to communication, as well as healthy ways to communicate with others.    Participation Level:  Did Not Attend   Carlos French 11/27/2024, 4:17 PM

## 2024-11-27 NOTE — Group Note (Signed)
 Date:  11/27/2024 Time:  3:44 PM  Group Topic/Focus: Occupational therapy Healthy Communication:   The focus of this group is to discuss communication, barriers to communication, as well as healthy ways to communicate with others.    Participation Level:  Did Not Attend  Carlos French 11/27/2024, 3:44 PM

## 2024-11-27 NOTE — Group Note (Signed)
 Date:  11/27/2024 Time:  10:46 AM  Group Topic/Focus:  Goals Group:   The focus of this group is to help patients establish daily goals to achieve during treatment and discuss how the patient can incorporate goal setting into their daily lives to aide in recovery.    Participation Level:  Did Not Attend  Participation Quality:  Did Not Attend  Affect:  Did Not Attend  Cognitive:  Did Not Attend  Insight: None  Engagement in Group:  Did Not Attend  Modes of Intervention:  Did Not Attend  Additional Comments:  Did Not Attend  Carlos French 11/27/2024, 10:46 AM

## 2024-11-27 NOTE — Plan of Care (Signed)
  Problem: Education: Goal: Knowledge of [facility] Education information/materials will improve Outcome: Progressing Goal: Emotional status will improve Outcome: Progressing Goal: Mental status will improve Outcome: Progressing Goal: Verbalization of understanding the information provided will improve Outcome: Progressing   Problem: Activity: Goal: Interest or engagement in activities will improve Outcome: Progressing Goal: Sleeping patterns will improve Outcome: Progressing   Problem: Coping: Goal: Ability to verbalize frustrations and anger appropriately will improve Outcome: Progressing Goal: Ability to demonstrate self-control will improve Outcome: Progressing   Problem: Health Behavior/Discharge Planning: Goal: Identification of resources available to assist in meeting health care needs will improve Outcome: Progressing Goal: Compliance with treatment plan for underlying cause of condition will improve Outcome: Progressing   Problem: Physical Regulation: Goal: Ability to maintain clinical measurements within normal limits will improve Outcome: Progressing   Problem: Safety: Goal: Periods of time without injury will increase Outcome: Progressing   Problem: Education: Goal: Knowledge of [facility] Education information/materials will improve Outcome: Progressing Goal: Emotional status will improve Outcome: Progressing Goal: Mental status will improve Outcome: Progressing Goal: Verbalization of understanding the information provided will improve Outcome: Progressing   Problem: Activity: Goal: Interest or engagement in activities will improve Outcome: Progressing Goal: Sleeping patterns will improve Outcome: Progressing   Problem: Coping: Goal: Ability to verbalize frustrations and anger appropriately will improve Outcome: Progressing Goal: Ability to demonstrate self-control will improve Outcome: Progressing   Problem: Health  Behavior/Discharge Planning: Goal: Identification of resources available to assist in meeting health care needs will improve Outcome: Progressing Goal: Compliance with treatment plan for underlying cause of condition will improve Outcome: Progressing   Problem: Physical Regulation: Goal: Ability to maintain clinical measurements within normal limits will improve Outcome: Progressing   Problem: Safety: Goal: Periods of time without injury will increase Outcome: Progressing   Problem: Health Behavior/Discharge Planning: Goal: Ability to identify changes in lifestyle to reduce recurrence of condition will improve Outcome: Progressing Goal: Identification of resources available to assist in meeting health care needs will improve Outcome: Progressing   Problem: Physical Regulation: Goal: Complications related to the disease process, condition or treatment will be avoided or minimized Outcome: Progressing   Problem: Safety: Goal: Ability to remain free from injury will improve Outcome: Progressing   Problem: Health Behavior/Discharge Planning: Goal: Identification of resources available to assist in meeting health care needs will improve Outcome: Progressing   Problem: Medication: Goal: Compliance with prescribed medication regimen will improve Outcome: Progressing   Problem: Self-Concept: Goal: Ability to disclose and discuss suicidal ideas will improve Outcome: Progressing Goal: Will verbalize positive feelings about self Outcome: Progressing Note: Patient is on track. Patient will maintain adherence    Problem: Activity: Goal: Interest or engagement in leisure activities will improve Outcome: Progressing Goal: Imbalance in normal sleep/wake cycle will improve Outcome: Progressing   Problem: Coping: Goal: Coping ability will improve Outcome: Progressing Goal: Will verbalize feelings Outcome: Progressing   Problem: Coping: Goal: Ability to identify and develop  effective coping behavior will improve Outcome: Progressing

## 2024-11-27 NOTE — [facility] Group Notes (Signed)
 Kiril did not attend wrap group.

## 2024-11-28 NOTE — Plan of Care (Signed)
  Problem: Education: Goal: Knowledge of [facility] Education information/materials will improve Outcome: Progressing Goal: Emotional status will improve Outcome: Progressing Goal: Mental status will improve Outcome: Progressing Goal: Verbalization of understanding the information provided will improve Outcome: Progressing   Problem: Activity: Goal: Interest or engagement in activities will improve Outcome: Progressing Goal: Sleeping patterns will improve Outcome: Progressing   Problem: Coping: Goal: Ability to verbalize frustrations and anger appropriately will improve Outcome: Progressing Goal: Ability to demonstrate self-control will improve Outcome: Progressing   Problem: Health Behavior/Discharge Planning: Goal: Identification of resources available to assist in meeting health care needs will improve Outcome: Progressing Goal: Compliance with treatment plan for underlying cause of condition will improve Outcome: Progressing   Problem: Physical Regulation: Goal: Ability to maintain clinical measurements within normal limits will improve Outcome: Progressing   Problem: Safety: Goal: Periods of time without injury will increase Outcome: Progressing   Problem: Education: Goal: Knowledge of [facility] Education information/materials will improve Outcome: Progressing Goal: Emotional status will improve Outcome: Progressing Goal: Mental status will improve Outcome: Progressing Goal: Verbalization of understanding the information provided will improve Outcome: Progressing   Problem: Activity: Goal: Interest or engagement in activities will improve Outcome: Progressing Goal: Sleeping patterns will improve Outcome: Progressing   Problem: Coping: Goal: Ability to verbalize frustrations and anger appropriately will improve Outcome: Progressing Goal: Ability to demonstrate self-control will improve Outcome: Progressing   Problem: Health  Behavior/Discharge Planning: Goal: Identification of resources available to assist in meeting health care needs will improve Outcome: Progressing Goal: Compliance with treatment plan for underlying cause of condition will improve Outcome: Progressing   Problem: Physical Regulation: Goal: Ability to maintain clinical measurements within normal limits will improve Outcome: Progressing   Problem: Safety: Goal: Periods of time without injury will increase Outcome: Progressing   Problem: Health Behavior/Discharge Planning: Goal: Ability to identify changes in lifestyle to reduce recurrence of condition will improve Outcome: Progressing Goal: Identification of resources available to assist in meeting health care needs will improve Outcome: Progressing   Problem: Physical Regulation: Goal: Complications related to the disease process, condition or treatment will be avoided or minimized Outcome: Progressing   Problem: Safety: Goal: Ability to remain free from injury will improve Outcome: Progressing   Problem: Health Behavior/Discharge Planning: Goal: Identification of resources available to assist in meeting health care needs will improve Outcome: Progressing   Problem: Medication: Goal: Compliance with prescribed medication regimen will improve Outcome: Progressing   Problem: Self-Concept: Goal: Ability to disclose and discuss suicidal ideas will improve Outcome: Progressing Goal: Will verbalize positive feelings about self Outcome: Progressing Note: Patient is on track. Patient will maintain adherence    Problem: Activity: Goal: Interest or engagement in leisure activities will improve Outcome: Progressing Goal: Imbalance in normal sleep/wake cycle will improve Outcome: Progressing   Problem: Coping: Goal: Coping ability will improve Outcome: Progressing Goal: Will verbalize feelings Outcome: Progressing   Problem: Coping: Goal: Ability to identify and develop  effective coping behavior will improve Outcome: Progressing

## 2024-11-28 NOTE — Progress Notes (Signed)
   11/28/24 1100  Psych Admission Type (Psych Patients Only)  Admission Status Voluntary/72 hour document signed  Psychosocial Assessment  Patient Complaints Worrying  Eye Contact Fair  Facial Expression Animated  Affect Appropriate to circumstance  Speech Logical/coherent  Interaction Assertive  Motor Activity Other (Comment) (wnl)  Appearance/Hygiene Disheveled  Behavior Characteristics Appropriate to situation;Cooperative  Mood Anxious  Thought Process  Coherency WDL  Content WDL  Delusions None reported or observed  Perception WDL  Hallucination None reported or observed  Judgment WDL  Confusion None  Danger to Self  Current suicidal ideation? Denies  Danger to Others  Danger to Others None reported or observed   Pt denies SI/HI/AVH. Pt denies withdrawal symptoms

## 2024-11-28 NOTE — Group Note (Signed)
 Date:  11/28/2024 Time:  12:57 PM  Group Topic/Focus: Social Wellness Fostering connection, teamwork, communication, and stress-relief through a fun and engaging group activity. Enhance Social Connection: Encourage participants to interact, communicate, and bond in a fun, low-pressure environment. Promote Teamwork and Collaboration: Jerrye cooperative problem-solving as players work together to united stationers and support their teammates. Boost Communication Skills: Improve non-verbal communication, creativity, and active listening through drawing and interpreting illustrations. Increase Engagement and Participation: Provide an enjoyable activity that motivates all group members to participate regardless of drawing ability. Encourage Stress Relief and Playfulness: Create a relaxed, enjoyable atmosphere that supports emotional well-being through laughter and play. Strengthen Group Cohesion: Build trust and camaraderie, helping members feel more comfortable and connected within the group.  Participation Level:  Active  Participation Quality:  Attentive  Affect:  Appropriate  Cognitive:  Alert and Appropriate  Insight: Appropriate  Engagement in Group:  Engaged  Modes of Intervention:  Activity  Additional Comments:  N/A  Carlos French 11/28/2024, 12:57 PM

## 2024-11-28 NOTE — Group Note (Signed)
 Date:  11/28/2024 Time:  10:38 AM  Group Topic/Focus: Recreational Therapy    Pt did not attend recreational therapy group  Kimmberly Wisser R Zade Falkner 11/28/2024, 10:38 AM

## 2024-11-28 NOTE — Progress Notes (Signed)
 Pt signed 72 hour Release Form    11/27/24 2155  Psych Admission Type (Psych Patients Only)  Admission Status Voluntary/72 hour document signed  Date 72 hour document signed  11/27/24  Time 72 hour document signed  2155  Provider Notified (First and Last Name) (see details for LINK to note) Thurman Ivans NP

## 2024-11-28 NOTE — Group Note (Signed)
 Date:  11/28/2024 Time:  9:05 PM  Group Topic/Focus:  Alcoholics Anonymous (AA) Meeting    Participation Level:  Active  Participation Quality:  Appropriate  Affect:  Appropriate  Cognitive:  Appropriate  Insight: Appropriate  Engagement in Group:  Engaged  Modes of Intervention:  Discussion, Socialization, and Support  Additional Comments:  Patient attended AA  Carlos French 11/28/2024, 9:05 PM

## 2024-11-28 NOTE — Progress Notes (Signed)
 Carlos French [facility] MD Progress Note  11/28/2024 3:18 PM Carlos French  MRN:  [id number]  Reason for admission: 33 year old male with a psychiatric history of MDD, GAD, PTSD, and polysubstance abuse who was admitted to the [facility] following presentation to the [facility] with worsening depressive symptoms and active suicidal ideation, including a stated plan to hang himself using a belt. Chart review indicates he was recently admitted to the Facility-Based Crisis unit in September 2025 for substance use treatment.   Daily notes: [name] is seen this morning outside his room. Chart reviewed. The chart findings discussed with the treatment team. He presents alert, oriented & aware of situation. He is visible on the unit, attending group sessions. His affect & mood are much improved today than yesterday. He is making a good eye contact. He reports, I'm doing well today. My mood is good. I'm no longer having any withdrawal symptoms today. I have no anxiety or depression symptoms. I'm taking my medicines, no side effects to report. I feel like I ready to be discharged. Besides, I will be leaving town to [place]  on Sunday for my job. I cannot miss going to [place]  or I will lose my job. After discharge from here, I will go to my Dad's home & leave to [place]  from there. I'm sleeping well. [name] currently denies any SIHI, AVH, delusional thoughts or paranoia. He does not appear to be responding to any internal stimuli.This case is discussed during treatment team meeting this afternoon. Patient is cleared to be discharged tomorrow morning. Besides, he also signed a 24 hour discharge request which will be due tomorrow as well. His elevated blood pressure is trending down (Normalizing). There are no changes made on the current plan of care. Continue as already in progress.  Principal Problem: MDD (major depressive disorder), recurrent episode, moderate (HCC)  Diagnosis:  Principal Problem:   MDD (major depressive disorder), recurrent episode, moderate (HCC) Active Problems:   Alcohol use disorder, moderate, dependence (HCC)   Tobacco use disorder, severe, dependence   PTSD (post-traumatic stress disorder)   Polysubstance abuse (HCC)  Total Time spent with patient: 45 minutes  Past Psychiatric History: See H&P.  Past Medical History:  Past Medical History:  Diagnosis Date   Allergy    Anxiety    Bipolar 1 disorder (HCC)    Depression    GERD (gastroesophageal reflux disease)    GI (gastrointestinal bleed)    GI bleeding    PTSD (post-traumatic stress disorder)    Suicidal ideation     Past Surgical History:  Procedure Laterality Date   NO PAST SURGERIES     Family History:  Family History  Problem Relation Age of Onset   Stroke Mother    Family Psychiatric  History: See H&P.  Social History:  Social History   Substance and Sexual Activity  Alcohol Use Yes   Alcohol/week: 84.0 standard drinks of alcohol   Types: 84 Cans of beer per week     Social History   Substance and Sexual Activity  Drug Use Yes   Types: Marijuana, Cocaine   Comment: marijuana daily and crack cocain a few times a week    Social History   Socioeconomic History   Marital status: Single    Spouse name: Not on file   Number of children: Not on file   Years of education: Not on file   Highest education level: Not on file  Occupational History   Not on file  Tobacco Use   Smoking status: Every Day    Current packs/day: 1.00    Types: Cigarettes   Smokeless tobacco: Former  Building Services Engineer status: Never Used  Substance and Sexual Activity   Alcohol use: Yes    Alcohol/week: 84.0 standard drinks of alcohol    Types: 84 Cans of beer per week   Drug use: Yes    Types: Marijuana, Cocaine    Comment: marijuana daily and crack cocain a few times a week   Sexual activity: Yes    Birth control/protection: None  Other Topics Concern   Not on file   Social History Narrative   Not on file   Social Drivers of Health   Financial Resource Strain: Not on file  Food Insecurity: Patient Declined (11/24/2024)   Hunger Vital Sign    Worried About Running Out of Food in the Last Year: Patient declined    Ran Out of Food in the Last Year: Patient declined  Transportation Needs: Patient Declined (11/24/2024)   PRAPARE - Administrator, Civil Service (Medical): Patient declined    Lack of Transportation (Non-Medical): Patient declined  Physical Activity: Not on file  Stress: Not on file  Social Connections: Unknown (05/09/2022)   Received from [employer]   Social Network    Social Network: Not on file   Additional Social History:   Sleep: Good Estimated Sleeping Duration (Last 24 Hours): 5.25-7.25 hours  Appetite:  Good  Current Medications: Current Facility-Administered Medications  Medication Dose Route Frequency Provider Last Rate Last Admin   albuterol  (VENTOLIN  HFA) 108 (90 Base) MCG/ACT inhaler 1-2 puff  1-2 puff Inhalation Q6H PRN Bennett, Christal H, NP       alum & mag hydroxide-simeth (MAALOX/MYLANTA) 200-200-20 MG/5ML suspension 30 mL  30 mL Oral Q4H PRN Wise, James Benjamin Stallworth, MD       alum & mag hydroxide-simeth (MAALOX/MYLANTA) 200-200-20 MG/5ML suspension 30 mL  30 mL Oral Q4H PRN Delsie Lynwood Morene Lavone, MD       chlordiazePOXIDE  (LIBRIUM ) capsule 25 mg  25 mg Oral Q6H PRN Bennett, Christal H, NP       [START ON 11/29/2024] chlordiazePOXIDE  (LIBRIUM ) capsule 25 mg  25 mg Oral Daily Bennett, Christal H, NP       cloNIDine  (CATAPRES ) tablet 0.1 mg  0.1 mg Oral Q6H PRN Bennett, Christal H, NP   0.1 mg at 11/27/24 0836   haloperidol  (HALDOL ) tablet 5 mg  5 mg Oral TID PRN Delsie Lynwood Morene Lavone, MD       And   diphenhydrAMINE  (BENADRYL ) capsule 50 mg  50 mg Oral TID PRN Delsie Lynwood Morene Lavone, MD       haloperidol  lactate (HALDOL ) injection 10 mg  10 mg Intramuscular TID PRN  Delsie Lynwood Morene Lavone, MD       And   diphenhydrAMINE  (BENADRYL ) injection 50 mg  50 mg Intramuscular TID PRN Delsie Lynwood Morene Lavone, MD       And   LORazepam  (ATIVAN ) injection 2 mg  2 mg Intramuscular TID PRN Delsie Lynwood Morene Lavone, MD       haloperidol  lactate (HALDOL ) injection 5 mg  5 mg Intramuscular TID PRN Delsie Lynwood Morene Lavone, MD       And   diphenhydrAMINE  (BENADRYL ) injection 50 mg  50 mg Intramuscular TID PRN Delsie Lynwood Morene Lavone, MD       hydrOXYzine  (ATARAX ) tablet 25 mg  25 mg Oral Q6H PRN  Blair, Christal H, NP   25 mg at 11/27/24 2105   influenza vac split trivalent PF (FLUZONE ) injection 0.5 mL  0.5 mL Intramuscular Tomorrow-1000 Towana Leita SAILOR, MD       lisinopril  (ZESTRIL ) tablet 5 mg  5 mg Oral Daily Raekwon Winkowski I, NP   5 mg at 11/28/24 9183   magnesium  hydroxide (MILK OF MAGNESIA) suspension 30 mL  30 mL Oral Daily PRN Delsie Lynwood Morene Lavone, MD       multivitamin with minerals tablet 1 tablet  1 tablet Oral Daily Delsie Lynwood Morene Lavone, MD   1 tablet at 11/28/24 0815   nicotine  (NICODERM CQ  - dosed in mg/24 hours) patch 14 mg  14 mg Transdermal Daily Bennett, Christal H, NP   14 mg at 11/28/24 0815   nicotine  polacrilex (NICORETTE ) gum 2 mg  2 mg Oral PRN Pashayan, Alexander S, DO   2 mg at 11/28/24 1303   OLANZapine  (ZYPREXA ) tablet 5 mg  5 mg Oral QHS Bennett, Christal H, NP   5 mg at 11/27/24 2105   pneumococcal 20-valent conjugate vaccine (PREVNAR 20 ) injection 0.5 mL  0.5 mL Intramuscular Tomorrow-1000 Towana Leita SAILOR, MD       thiamine  (Vitamin B-1) tablet 100 mg  100 mg Oral Daily Delsie Lynwood Morene Lavone, MD   100 mg at 11/28/24 0815   traZODone  (DESYREL ) tablet 50 mg  50 mg Oral QHS PRN Delsie Lynwood Morene Lavone, MD   50 mg at 11/27/24 2105   Lab Results:  No results found for this or any previous visit (from the past 48 hours).  Blood Alcohol level:  Lab Results  Component  Value Date   ETH 94 (H) 11/24/2024   ETH <15 09/07/2024   Metabolic Disorder Labs: Lab Results  Component Value Date   HGBA1C 5.1 11/24/2024   MPG 100 11/24/2024   Lab Results  Component Value Date   PROLACTIN 32.3 (H) 02/10/2016   Lab Results  Component Value Date   CHOL 183 11/24/2024   TRIG 91 11/24/2024   HDL 74 11/24/2024   CHOLHDL 2.5 11/24/2024   VLDL 18 11/24/2024   LDLCALC 91 11/24/2024   LDLCALC 99 02/10/2016   Physical Findings: AIMS:  ,  ,  ,  ,  ,  ,   CIWA:  CIWA-Ar Total: 0 COWS:     Musculoskeletal: Strength & Muscle Tone: within normal limits Gait & Station: normal Patient leans: N/A  Psychiatric Specialty Exam:  Presentation  General Appearance:  Appropriate for Environment; Casual; Fairly Groomed  Eye Contact: Good  Speech: Clear and Coherent; Normal Rate  Speech Volume: Normal  Handedness: Right   Mood and Affect  Mood: -- (Improving.)  Affect: Congruent; Appropriate   Thought Process  Thought Processes: Coherent; Linear  Descriptions of Associations:Intact  Orientation:Full (Time, Place and Person)  Thought Content:Logical  History of Schizophrenia/Schizoaffective disorder:No  Duration of Psychotic Symptoms: NA Hallucinations:Hallucinations: None   Ideas of Reference:None  Suicidal Thoughts:Suicidal Thoughts: No   Homicidal Thoughts:Homicidal Thoughts: No    Sensorium  Memory: Immediate Good; Recent Good; Remote Good  Judgment: Fair  Insight: Fair   Art Therapist  Concentration: Good  Attention Span: Good  Recall: Good  Fund of Knowledge: Fair  Language: Good   Psychomotor Activity  Psychomotor Activity: Psychomotor Activity: Normal    Assets  Assets: Communication Skills; Desire for Improvement; Financial Resources/Insurance; Housing; Resilience; Physical Health; Social Support  Sleep  Sleep: Sleep: Good Number of Hours of Sleep:  9    Physical Exam: Physical  Exam Vitals and nursing note reviewed.  HENT:     Head: Normocephalic.     Nose: Nose normal.     Mouth/Throat:     Pharynx: Oropharynx is clear.  Cardiovascular:     Pulses: Normal pulses.  Pulmonary:     Effort: Pulmonary effort is normal.  Genitourinary:    Comments: Deferred. Musculoskeletal:        General: Normal range of motion.     Cervical back: Normal range of motion.  Skin:    General: Skin is dry.  Neurological:     General: No focal deficit present.     Mental Status: He is alert and oriented to person, place, and time.    Review of Systems  Constitutional:  Negative for chills, diaphoresis and fever.  HENT:  Negative for congestion and sore throat.   Respiratory:  Negative for cough, shortness of breath and wheezing.   Cardiovascular:  Negative for chest pain and palpitations.  Gastrointestinal:  Negative for abdominal pain, constipation, diarrhea, heartburn, nausea and vomiting.  Genitourinary:  Negative for dysuria.  Musculoskeletal:  Negative for joint pain and myalgias.  Neurological:  Negative for dizziness, tingling, tremors, sensory change, speech change, focal weakness, seizures, loss of consciousness, weakness and headaches.  Endo/Heme/Allergies:        Allergies: Tyleno, NSAIDS )Motrin ).  Psychiatric/Behavioral:  Positive for substance abuse. Negative for suicidal ideas. The patient is nervous/anxious and has insomnia.    Blood pressure 125/86, pulse 89, temperature (!) 97.4 F (36.3 C), temperature source Oral, resp. rate 18, height 5' 6 (1.676 m), weight 52.2 kg, SpO2 100%. Body mass index is 18.56 kg/m.  Treatment Plan Summary: Daily contact with patient to assess and evaluate symptoms and progress in treatment and Medication management.   Continue inpatient hospitalization.  Will continue today 11/28/2024 plan as below except where it is noted.   2. Psychiatric Diagnoses and Treatment:    # MDD  GAD  -- Continue Olanzapine  5 mg oral  nightly, mood stability  -- Hydroxyzine  25 mg oral, 3 times daily as needed, anxiety -- Trazodone  50 mg, oral, daily at bedtime as needed, sleep              -- Haldol  BH Agitation Protocol (See MAR).              3. Medical Issues Being Addressed:        # Nicotine  Dependence  -- Nicotine  14 patch daily  -- Nicorette  Gum 2 mg as needed              -- Smoking cessation encouraged.              -- Initiated Lisinopril  10 mg po once for HTN.              -- Continue Lisinopril  5 mg po daily starting 11-28-24.               # Alcohol Use Disorder              -- CIWA protocol              -- Librium  taper (See MAR)    4. Labs  -- CBC: Unremarkable             -- CMP: Potassium 3.2, Chloride 96, Blood glucose 67, otherwise WNL (Recheck potassium in the morning).              --  Ethanol: 94             -- Lipid Panel: Unremarkable             -- HgBA1c: WNL             -- TSH: WNL             -- UDS: + Cocaine, THC             -- EKG: NSR  QT/QTc 424/440   -- FDA therapies  -- Short Term Goals: Ability to identify changes in lifestyle to reduce recurrence of condition will improve, Ability to verbalize feelings will improve, Ability to disclose and discuss suicidal ideas, Ability to demonstrate self-control will improve, Ability to identify and develop effective coping behaviors will improve, Ability to maintain clinical measurements within normal limits will improve, Compliance with prescribed medications will improve, and Ability to identify triggers associated with substance abuse/mental health issues will improve             -- Long Term Goals: Improvement in symptoms so as ready for discharge     5. Discharge Planning:  -- Social work and case management to assist with discharge planning and identification of hospital follow-up needs prior to discharge -- Estimated LOS: 5-7 days -- Discharge Concerns: Need to establish a safety plan; Medication compliance and effectiveness --  Discharge Goals: Return home with outpatient referrals for mental health follow-up including medication management/psychotherapy     Mac Bolster, NP, pmhnp, fnp-bc. 11/28/2024, 3:18 PM Patient ID: Carlos French, male   DOB: 07/14/1991, 33 y.o.   MRN: [id number]

## 2024-11-28 NOTE — Group Note (Unsigned)
 Date:  11/28/2024 Time:  8:26 PM  Group Topic/Focus:  Wrap-Up Group:   The focus of this group is to help patients review their daily goal of treatment and discuss progress on daily workbooks.     Participation Level:  {BHH PARTICIPATION OZCZO:77735}  Participation Quality:  {BHH PARTICIPATION QUALITY:22265}  Affect:  {BHH AFFECT:22266}  Cognitive:  {BHH COGNITIVE:22267}  Insight: {BHH Insight2:20797}  Engagement in Group:  {BHH ENGAGEMENT IN HMNLE:77731}  Modes of Intervention:  {BHH MODES OF INTERVENTION:22269}  Additional Comments:  ***  Gwenn Nobie Brooklyn 11/28/2024, 8:26 PM

## 2024-11-28 NOTE — Group Note (Signed)
 Date:  11/28/2024 Time:  9:53 AM  Group Topic/Focus:  Goals Group:   The focus of this group is to help patients establish daily goals to achieve during treatment and discuss how the patient can incorporate goal setting into their daily lives to aide in recovery. Orientation:   The focus of this group is to educate the patient on the purpose and policies of crisis stabilization and provide a format to answer questions about their admission.  The group details unit policies and expectations of patients while admitted.    Participation Level:  Did Not Attend  Carlos French 11/28/2024, 9:53 AM

## 2024-11-28 NOTE — Group Note (Signed)
 Date:  11/28/2024 Time:  7:19 PM  Group Topic/Focus:  Gut flora and it's impact on behavioral health    Participation Level:  Active  Participation Quality:  Appropriate  Affect:  Appropriate  Cognitive:  Appropriate  Insight: Appropriate  Engagement in Group:  Engaged  Modes of Intervention:  Discussion and Education  Additional Comments:    Juliene CHRISTELLA Huddle 11/28/2024, 7:19 PM

## 2024-11-28 NOTE — Group Note (Signed)
 Recreation Therapy Group Note   Group Topic:Communication  Group Date: 11/28/2024 Start Time: 0933 End Time: 1025 Facilitators: Ceniya Fowers-McCall, LRT,CTRS Location: [address] Dayroom   Group Topic: Communication, Problem Solving   Goal Area(s) Addresses:  Patient will effectively listen to complete activity.  Patient will identify communication skills used to make activity successful.  Patient will identify how skills used during activity can be used to reach post d/c goals.    Behavioral Response:    Intervention: Building Surveyor Activity - Geometric pattern cards, pencils, blank paper    Activity: Geometric Drawings.  Three volunteers from the peer group will be shown an abstract picture with a particular arrangement of geometrical shapes.  Each round, one 'speaker' will describe the pattern, as accurately as possible without revealing the image to the group.  The remaining group members will listen and draw the picture to reflect how it is described to them. Patients with the role of 'listener' cannot ask clarifying questions but, may request that the speaker repeat a direction. Once the drawings are complete, the presenter will show the rest of the group the picture and compare how close each person came to drawing the picture. LRT will facilitate a post-activity discussion regarding effective communication and the importance of planning, listening, and asking for clarification in daily interactions with others.  Education: Environmental consultant, Active listening, Support systems, Discharge planning  Education Outcome: Acknowledges understanding/In group clarification offered/Needs additional education.    Affect/Mood: N/A   Participation Level: Did not attend    Clinical Observations/Individualized Feedback:      Plan: Continue to engage patient in RT group sessions 2-3x/week.   Skyler Carel-McCall, LRT,CTRS 11/28/2024 12:49 PM

## 2024-11-28 NOTE — Progress Notes (Signed)
 Shift Note  (Sleep Hours) - 8  (Any PRNs that were needed, meds refused, or side effects to meds)-   PO PRN Hydroxyzine : Anxiety   PO PRN Trazodone : Insomnia   (Any disturbances and when (visitation, over night)- Denies  (Concerns raised by the patient)- Wants to talk to doctor about treatment plan and estimated discharge date   (SI/HI/AVH)- Denies

## 2024-11-29 MED ORDER — VITAMIN B-1 100 MG PO TABS: 100.0000 mg | ORAL_TABLET | Freq: Every day | ORAL | Status: AC

## 2024-11-29 MED ORDER — CHLORDIAZEPOXIDE HCL 25 MG PO CAPS: 25.0000 mg | ORAL_CAPSULE | Freq: Once | ORAL | Status: DC

## 2024-11-29 MED ORDER — OLANZAPINE 5 MG PO TABS: 5.0000 mg | ORAL_TABLET | Freq: Every day | ORAL | 0 refills | Status: DC

## 2024-11-29 MED ORDER — NICOTINE 14 MG/24HR TD PT24: 14.0000 mg | MEDICATED_PATCH | Freq: Every day | TRANSDERMAL | 0 refills | Status: DC

## 2024-11-29 MED ORDER — LISINOPRIL 5 MG PO TABS: 5.0000 mg | ORAL_TABLET | Freq: Every day | ORAL | 0 refills | Status: AC

## 2024-11-29 NOTE — BHH Suicide Risk Assessment (Signed)
 BHH INPATIENT:  Family/Significant Other Suicide Prevention Education  Suicide Prevention Education:  Suicide Prevention Education was reviewed thoroughly with patient, including risk factors, warning signs, and what to do.  Mobile Crisis services were described and that telephone number pointed out, with encouragement to patient to put this number in personal cell phone.  Brochure was provided to patient to share with natural supports.  Patient acknowledged the ways in which they are at risk, and how working through each of their issues can gradually start to reduce their risk factors.  Patient was encouraged to think of the information in the context of people in their own lives.  Patient denied having access to firearms  Patient verbalized understanding of information provided.  Patient endorsed a desire to live.   Carlos French LCSWA 11/29/2024

## 2024-11-29 NOTE — Progress Notes (Signed)
  [facility] Adult Case Management Discharge Plan :  Will you be returning to the same living situation after discharge:  Yes,    At discharge, do you have transportation home?: Yes,  CSW coordinated taxi voucher Do you have the ability to pay for your medications: Yes,  [employer]  Release of information consent forms completed and in the chart;  Patient's signature needed at discharge.  Patient to Follow up at:  Follow-up Information     [facility] Follow up on 12/05/2024.   Why: You have a hospital follow-up appointment for therapy and medication management services on 12/05/24 at 10:00 am.  The appointment will be Virtual, telehealth. Contact information: [address]  [address] [place] [address] [phone]                 Next level of care provider has access to [facility] Link:no  Safety Planning and Suicide Prevention discussed: Yes,  completed with patient d/t patient declining consents   Has patient been referred to the Quitline?: Yes, faxed/e-referral on 11/29/2024  Patient has been referred for addiction treatment: Yes, the patient will follow up with an outpatient provider for substance use disorder. Psychiatrist/APP: appointment made  Golda Louder, LCSW 11/29/2024, 9:44 AM
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
## 2024-11-29 NOTE — Progress Notes (Signed)
 11/29/2024  Carlos French DOB: [birth year] MRN: [id number]   RIDER WAIVER AND RELEASE OF LIABILITY  For the purposes of helping with transportation needs, [facility] partners with outside transportation providers (taxi companies, [employer], catering manager.) to give [facility] patients or other approved people the choice of on-demand rides Public Librarian) to our buildings for non-emergency visits.  By using [employer], I, the person signing this document, on behalf of myself and/or any legal minors (in my care using the [employer]), agree:  Science Writer given to me are supplied by independent, outside transportation providers who do not work for, or have any affiliation with, [employer]. [facility] is not a transportation company. [facility] has no control over the quality or safety of the rides I get using [employer]. [facility] has no control over whether any outside ride will happen on time or not. [facility] gives no guarantee on the reliability, quality, safety, or availability on any rides, or that no mistakes will happen. I know and accept that traveling by vehicle (car, truck, SVU, fleeta, bus, taxi, etc.) has risks of serious injuries such as disability, being paralyzed, and death. I know and agree the risk of using [employer] is mine alone, and not [employer]. [employer] are provided as is and as are available. The transportation providers are in charge for all inspections and care of the vehicles used to provide these rides. I agree not to take legal action against [facility], its agents, employees, officers, directors, representatives, insurers, attorneys, assigns, successors, subsidiaries, and affiliates at any time for any reasons related directly or indirectly to using [employer]. I also agree not to take legal action against [facility] or its affiliates for any injury, death, or damage to property caused by or related to using  [employer]. I have read this Waiver and Release of Liability, and I understand the terms used in it and their legal meaning. This Waiver is freely and voluntarily given with the understanding that my right (or any legal minors) to legal action against [facility] relating to [employer] is knowingly given up to use these services.   I attest that I read the Ride Waiver and Release of Liability to Carlos French, gave Mr. [facility]kison the opportunity to ask questions and answered the questions asked (if any). I affirm that Carlos French then provided consent for assistance with transportation.        Cruzito Standre LCSWA

## 2024-11-29 NOTE — [facility] Suicide Risk Assessment (Signed)
 [facility] Discharge Suicide Risk Assessment   Principal Problem: MDD (major depressive disorder), recurrent episode, moderate (HCC) Discharge Diagnoses: Principal Problem:   MDD (major depressive disorder), recurrent episode, moderate (HCC) Active Problems:   Alcohol use disorder, moderate, dependence (HCC)   Tobacco use disorder, severe, dependence   PTSD (post-traumatic stress disorder)   Polysubstance abuse (HCC)  During the patient's hospitalization, patient had extensive initial psychiatric evaluation, and follow-up psychiatric evaluations every day.  Psychiatric diagnoses provided upon initial assessment:    MDD (major depressive disorder), recurrent episode, moderate (HCC) Active Problems:   Alcohol use disorder, moderate, dependence (HCC)   Tobacco use disorder, severe, dependence   PTSD (post-traumatic stress disorder)   Polysubstance abuse (HCC)  Patient's psychiatric medications were adjusted on admission: Restarted Zyprexa .  Placed him on a Librium  taper  During the hospitalization, other adjustments were made to the patient's psychiatric medication regimen: None  Gradually, patient started adjusting to milieu.   Patient's care was discussed during the interdisciplinary team meeting every day during the hospitalization.  The patient is not having side effects to prescribed psychiatric medication.  The patient reports their target psychiatric symptoms of depression responded well to the psychiatric medications, and the patient reports overall benefit other psychiatric hospitalization. Supportive psychotherapy was provided to the patient. The patient also participated in regular group therapy while admitted.   Labs were reviewed with the patient, and abnormal results were discussed with the patient.  The patient denied having suicidal thoughts more than 48 hours prior to discharge.  Patient denies having homicidal thoughts.  Patient denies having auditory hallucinations.   Patient denies any visual hallucinations.  Patient denies having paranoid thoughts.  The patient is able to verbalize their individual safety plan to this provider.  It is recommended to the patient to continue psychiatric medications as prescribed, after discharge from the hospital.    It is recommended to the patient to follow up with your outpatient psychiatric provider and PCP.  Discussed with the patient, the impact of alcohol, drugs, tobacco have been there overall psychiatric and medical wellbeing, and total abstinence from substance use was recommended the patient.  Total Time spent with patient: 20 minutes  Musculoskeletal: Strength & Muscle Tone: within normal limits Gait & Station: normal Patient leans: N/A  Psychiatric Specialty Exam  Presentation  General Appearance:  Appropriate for Environment; Casual; Fairly Groomed  Eye Contact: Good  Speech: Clear and Coherent; Normal Rate  Speech Volume: Normal  Handedness: Right   Mood and Affect  Mood: -- (good)  Duration of Depression Symptoms: Greater than two weeks  Affect: Congruent; Appropriate   Thought Process  Thought Processes: Coherent; Goal Directed  Descriptions of Associations:Intact  Orientation:Full (Time, Place and Person)  Thought Content:Logical; WDL  History of Schizophrenia/Schizoaffective disorder:No  Duration of Psychotic Symptoms:No data recorded Hallucinations:Hallucinations: None  Ideas of Reference:None  Suicidal Thoughts:Suicidal Thoughts: No  Homicidal Thoughts:Homicidal Thoughts: No   Sensorium  Memory: Immediate Good; Recent Good  Judgment: Good  Insight: Good   Executive Functions  Concentration: Good  Attention Span: Good  Recall: Good  Fund of Knowledge: Good  Language: Good   Psychomotor Activity  Psychomotor Activity: Psychomotor Activity: Normal   Assets  Assets: Communication Skills; Desire for Improvement; Financial  Resources/Insurance; Resilience; Physical Health; Social Support; Housing   Sleep  Sleep: Sleep: Good  Estimated Sleeping Duration (Last 24 Hours): 5.25-6.75 hours  Physical Exam: Physical Exam Vitals and nursing note reviewed.  Constitutional:      General: He is  not in acute distress.    Appearance: Normal appearance. He is normal weight. He is not ill-appearing or toxic-appearing.  HENT:     Head: Normocephalic and atraumatic.  Pulmonary:     Effort: Pulmonary effort is normal.  Musculoskeletal:        General: Normal range of motion.  Neurological:     General: No focal deficit present.     Mental Status: He is alert.    Review of Systems  Respiratory:  Negative for cough and shortness of breath.   Cardiovascular:  Negative for chest pain.  Gastrointestinal:  Negative for abdominal pain, constipation, diarrhea, nausea and vomiting.  Neurological:  Negative for dizziness, weakness and headaches.  Psychiatric/Behavioral:  Negative for depression, hallucinations and suicidal ideas. The patient is not nervous/anxious.    Blood pressure 115/82, pulse 87, temperature (!) 97.4 F (36.3 C), temperature source Oral, resp. rate 16, height 5' 6 (1.676 m), weight 52.2 kg, SpO2 100%. Body mass index is 18.56 kg/m.  Mental Status Per Nursing Assessment::   On Admission:  NA  Demographic Factors:  Male and Caucasian  Loss Factors: Financial problems/change in socioeconomic status  Historical Factors: Impulsivity  Risk Reduction Factors:   Living with another person, especially a relative and Positive social support  Continued Clinical Symptoms:  Alcohol/Substance Abuse/Dependencies More than one psychiatric diagnosis Previous Psychiatric Diagnoses and Treatments Medical Diagnoses and Treatments/Surgeries  Cognitive Features That Contribute To Risk:  None    Suicide Risk:  Minimal: No identifiable suicidal ideation.  Patients presenting with no risk factors but  with morbid ruminations; may be classified as minimal risk based on the severity of the depressive symptoms   Follow-up Information     [facility] Follow up on 12/05/2024.   Why: You have a hospital follow-up appointment for therapy and medication management services on 12/05/24 at 10:00 am.  The appointment will be Virtual, telehealth. Contact information: [address]  [address] [place] [address] [phone]                 Plan Of Care/Follow-up recommendations:  Activity: as tolerated  Diet: heart healthy  Other: -Follow-up with your outpatient psychiatric provider -instructions on appointment date, time, and address (location) are provided to you in discharge paperwork.  -Take your psychiatric medications as prescribed at discharge - instructions are provided to you in the discharge paperwork  -Follow-up with outpatient primary care doctor and other specialists -for management of chronic medical disease, including: Routine Care.  Discussed with your PCP about elevated blood pressure while hospitalized.  Continued care for Substance Abuse.  -Testing: Follow-up with outpatient provider for abnormal lab results: None  -Recommend abstinence from alcohol, tobacco, and other illicit drug use at discharge.   -If your psychiatric symptoms recur, worsen, or if you have side effects to your psychiatric medications, call your outpatient psychiatric provider, 911, [phone] or go to the nearest emergency department.  -If suicidal thoughts recur, call your outpatient psychiatric provider, 911, [phone] or go to the nearest emergency department.   Marsa GORMAN Rosser, DO 11/29/2024, 8:04 AM

## 2024-11-29 NOTE — Progress Notes (Signed)
 Patient discharged off unit at 1000. Patient belongings reviewed and acknowledged by patient. AVS and Transition Record reviewed and acknowledged by patient. Safety plan completed by patient, reviewed by nurse with patient and copy provided. Any medications and or prescriptions necessary for discharge addressed and provided to patient. Patient denies SI, plan or intent. Denies HI. Denies AVH. No observed or reported side effects to medication. No observed or reported agitation, aggression, or other acute emotional distress. No reported or observed physical abnormalities or concerns. Patient transportation from facility verified and observed.

## 2024-11-29 NOTE — Group Note (Signed)
 Date:  11/29/2024 Time:  9:08 AM  Group Topic/Focus:  Goals Group:   The focus of this group is to help patients establish daily goals to achieve during treatment and discuss how the patient can incorporate goal setting into their daily lives to aide in recovery.    Participation Level:  Active  Participation Quality:  Appropriate  Affect:  Appropriate  Cognitive:  Appropriate  Insight: Appropriate  Engagement in Group:  Engaged  Modes of Intervention:  Discussion  Additional Comments:  Pt was an active participant in group.  Deandra Goering A Morse Brueggemann 11/29/2024, 9:08 AM

## 2024-12-07 ENCOUNTER — Ambulatory Visit (HOSPITAL_COMMUNITY)
Admission: EM | Admit: 2024-12-07 | Discharge: 2024-12-07 | Disposition: A | Discharge status: Other Acute Inpatient Hospital | Payer: MEDICAID | Attending: Nurse Practitioner | Admitting: Nurse Practitioner

## 2024-12-07 ENCOUNTER — Inpatient Hospital Stay (HOSPITAL_COMMUNITY)
Admission: AD | Admit: 2024-12-07 | Discharge: 2024-12-11 | DRG: 885 | Disposition: A | Discharge status: Home / Self Care | Payer: MEDICAID | Source: Intra-hospital

## 2024-12-07 ENCOUNTER — Encounter (HOSPITAL_COMMUNITY): Payer: Self-pay

## 2024-12-07 ENCOUNTER — Other Ambulatory Visit: Payer: Self-pay

## 2024-12-07 DIAGNOSIS — F10139 Alcohol abuse with withdrawal, unspecified: Secondary | ICD-10-CM | POA: Diagnosis present

## 2024-12-07 DIAGNOSIS — F1090 Alcohol use, unspecified, uncomplicated: Secondary | ICD-10-CM | POA: Diagnosis not present

## 2024-12-07 DIAGNOSIS — F603 Borderline personality disorder: Secondary | ICD-10-CM | POA: Diagnosis present

## 2024-12-07 DIAGNOSIS — F4310 Post-traumatic stress disorder, unspecified: Secondary | ICD-10-CM | POA: Diagnosis present

## 2024-12-07 DIAGNOSIS — Z5900 Homelessness unspecified: Secondary | ICD-10-CM | POA: Diagnosis not present

## 2024-12-07 DIAGNOSIS — F319 Bipolar disorder, unspecified: Secondary | ICD-10-CM | POA: Diagnosis present

## 2024-12-07 DIAGNOSIS — Z79899 Other long term (current) drug therapy: Secondary | ICD-10-CM | POA: Diagnosis not present

## 2024-12-07 DIAGNOSIS — F410 Panic disorder [episodic paroxysmal anxiety] without agoraphobia: Secondary | ICD-10-CM | POA: Diagnosis present

## 2024-12-07 DIAGNOSIS — F3181 Bipolar II disorder: Secondary | ICD-10-CM | POA: Diagnosis not present

## 2024-12-07 DIAGNOSIS — Z9151 Personal history of suicidal behavior: Secondary | ICD-10-CM | POA: Diagnosis not present

## 2024-12-07 DIAGNOSIS — Z886 Allergy status to analgesic agent status: Secondary | ICD-10-CM | POA: Diagnosis not present

## 2024-12-07 DIAGNOSIS — Z823 Family history of stroke: Secondary | ICD-10-CM

## 2024-12-07 DIAGNOSIS — F1420 Cocaine dependence, uncomplicated: Secondary | ICD-10-CM | POA: Diagnosis present

## 2024-12-07 DIAGNOSIS — F323 Major depressive disorder, single episode, severe with psychotic features: Secondary | ICD-10-CM

## 2024-12-07 DIAGNOSIS — Z818 Family history of other mental and behavioral disorders: Secondary | ICD-10-CM | POA: Diagnosis not present

## 2024-12-07 DIAGNOSIS — F17203 Nicotine dependence unspecified, with withdrawal: Secondary | ICD-10-CM | POA: Diagnosis present

## 2024-12-07 DIAGNOSIS — F840 Autistic disorder: Secondary | ICD-10-CM | POA: Diagnosis not present

## 2024-12-07 DIAGNOSIS — F411 Generalized anxiety disorder: Secondary | ICD-10-CM | POA: Diagnosis present

## 2024-12-07 DIAGNOSIS — Z716 Tobacco abuse counseling: Secondary | ICD-10-CM | POA: Diagnosis not present

## 2024-12-07 DIAGNOSIS — I10 Essential (primary) hypertension: Secondary | ICD-10-CM | POA: Diagnosis present

## 2024-12-07 DIAGNOSIS — F1910 Other psychoactive substance abuse, uncomplicated: Secondary | ICD-10-CM | POA: Diagnosis not present

## 2024-12-07 DIAGNOSIS — F17210 Nicotine dependence, cigarettes, uncomplicated: Secondary | ICD-10-CM | POA: Diagnosis not present

## 2024-12-07 LAB — COMPREHENSIVE METABOLIC PANEL WITH GFR
ALT: 18 U/L (ref 0–44)
AST: 19 U/L (ref 15–41)
Albumin: 4.7 g/dL (ref 3.5–5.0)
Alkaline Phosphatase: 79 U/L (ref 38–126)
Anion gap: 7 (ref 5–15)
BUN: 11 mg/dL (ref 6–20)
CO2: 29 mmol/L (ref 22–32)
Calcium: 9.5 mg/dL (ref 8.9–10.3)
Chloride: 100 mmol/L (ref 98–111)
Creatinine, Ser: 0.68 mg/dL (ref 0.61–1.24)
GFR, Estimated: >60 mL/min (ref >60)
Glucose, Bld: 73 mg/dL (ref 70–99)
Potassium: 3.9 mmol/L (ref 3.5–5.1)
Sodium: 136 mmol/L (ref 135–145)
Total Bilirubin: 0.5 mg/dL (ref 0.0–1.2)
Total Protein: 7.9 g/dL (ref 6.5–8.1)

## 2024-12-07 LAB — CBC WITH DIFFERENTIAL/PLATELET
Abs Immature Granulocytes: 0.03 K/uL (ref 0.00–0.07)
Basophils Absolute: 0.1 K/uL (ref 0.0–0.1)
Basophils Relative: 1 %
Eosinophils Absolute: 0.3 K/uL (ref 0.0–0.5)
Eosinophils Relative: 5 %
HCT: 45.7 % (ref 39.0–52.0)
Hemoglobin: 15.9 g/dL (ref 13.0–17.0)
Immature Granulocytes: 0 %
Lymphocytes Relative: 23 %
Lymphs Abs: 1.6 K/uL (ref 0.7–4.0)
MCH: 32.7 pg (ref 26.0–34.0)
MCHC: 34.8 g/dL (ref 30.0–36.0)
MCV: 94 fL (ref 80.0–100.0)
Monocytes Absolute: 0.7 K/uL (ref 0.1–1.0)
Monocytes Relative: 10 %
Neutro Abs: 4.2 K/uL (ref 1.7–7.7)
Neutrophils Relative %: 61 %
Platelets: 347 K/uL (ref 150–400)
RBC: 4.86 MIL/uL (ref 4.22–5.81)
RDW: 12.2 % (ref 11.5–15.5)
WBC: 6.9 K/uL (ref 4.0–10.5)
nRBC: 0 % (ref 0.0–0.2)

## 2024-12-07 LAB — POCT URINE DRUG SCREEN - MANUAL ENTRY (I-SCREEN)
POC Amphetamine UR: NOT DETECTED
POC Buprenorphine (BUP): NOT DETECTED
POC Cocaine UR: POSITIVE — AB
POC Marijuana UR: POSITIVE — AB
POC Methadone UR: NOT DETECTED
POC Methamphetamine UR: NOT DETECTED
POC Morphine: NOT DETECTED
POC Oxazepam (BZO): POSITIVE — AB
POC Oxycodone UR: NOT DETECTED
POC Secobarbital (BAR): NOT DETECTED

## 2024-12-07 LAB — VITAMIN B12: Vitamin B-12: 281 pg/mL (ref 180–914)

## 2024-12-07 LAB — ETHANOL: Alcohol, Ethyl (B): <15 mg/dL (ref <15)

## 2024-12-07 LAB — VITAMIN D 25 HYDROXY (VIT D DEFICIENCY, FRACTURES): Vit D, 25-Hydroxy: 21.36 ng/mL — ABNORMAL LOW (ref 30–100)

## 2024-12-07 MED ORDER — MAGNESIUM HYDROXIDE 400 MG/5ML PO SUSP: 30.0000 mL | Freq: Every day | ORAL | Status: DC | PRN

## 2024-12-07 MED ORDER — TRAZODONE HCL 50 MG PO TABS
50.0000 mg | ORAL_TABLET | Freq: Every evening | ORAL | Status: DC | PRN
  Administered 2024-12-07 – 2024-12-10 (×4): 50 mg via ORAL
  Filled 2024-12-07 (×4): qty 1

## 2024-12-07 MED ORDER — LORAZEPAM 2 MG/ML IJ SOLN: 2.0000 mg | Freq: Three times a day (TID) | INTRAMUSCULAR | Status: DC | PRN

## 2024-12-07 MED ORDER — LISINOPRIL 5 MG PO TABS
5.0000 mg | ORAL_TABLET | Freq: Every day | ORAL | Status: DC
  Administered 2024-12-07: 5 mg via ORAL
  Filled 2024-12-07: qty 1

## 2024-12-07 MED ORDER — ONDANSETRON 4 MG PO TBDP: 4.0000 mg | ORAL_TABLET | Freq: Four times a day (QID) | ORAL | Status: DC | PRN

## 2024-12-07 MED ORDER — DIPHENHYDRAMINE HCL 50 MG PO CAPS: 50.0000 mg | ORAL_CAPSULE | Freq: Three times a day (TID) | ORAL | Status: DC | PRN

## 2024-12-07 MED ORDER — HALOPERIDOL LACTATE 5 MG/ML IJ SOLN: 10.0000 mg | Freq: Three times a day (TID) | INTRAMUSCULAR | Status: DC | PRN

## 2024-12-07 MED ORDER — HYDROXYZINE HCL 25 MG PO TABS
25.0000 mg | ORAL_TABLET | Freq: Three times a day (TID) | ORAL | Status: DC | PRN
  Administered 2024-12-07 – 2024-12-10 (×4): 25 mg via ORAL
  Filled 2024-12-07 (×2): qty 1

## 2024-12-07 MED ORDER — HYDROXYZINE HCL 25 MG PO TABS
25.0000 mg | ORAL_TABLET | Freq: Three times a day (TID) | ORAL | Status: DC | PRN
  Filled 2024-12-07: qty 1

## 2024-12-07 MED ORDER — TRAZODONE HCL 50 MG PO TABS: 50.0000 mg | ORAL_TABLET | Freq: Every evening | ORAL | Status: DC | PRN

## 2024-12-07 MED ORDER — HYDROXYZINE HCL 25 MG PO TABS
25.0000 mg | ORAL_TABLET | Freq: Four times a day (QID) | ORAL | Status: AC | PRN
  Filled 2024-12-07 (×2): qty 1

## 2024-12-07 MED ORDER — HALOPERIDOL LACTATE 5 MG/ML IJ SOLN: 5.0000 mg | Freq: Three times a day (TID) | INTRAMUSCULAR | Status: DC | PRN

## 2024-12-07 MED ORDER — NICOTINE 14 MG/24HR TD PT24
14.0000 mg | MEDICATED_PATCH | Freq: Every day | TRANSDERMAL | Status: AC
  Administered 2024-12-08 – 2024-12-10 (×3): 14 mg via TRANSDERMAL
  Filled 2024-12-07 (×4): qty 1

## 2024-12-07 MED ORDER — OLANZAPINE 5 MG PO TABS
5.0000 mg | ORAL_TABLET | Freq: Every day | ORAL | Status: DC
  Filled 2024-12-07: qty 1

## 2024-12-07 MED ORDER — HYDROXYZINE HCL 25 MG PO TABS
25.0000 mg | ORAL_TABLET | Freq: Four times a day (QID) | ORAL | Status: DC | PRN
  Administered 2024-12-07: 25 mg via ORAL

## 2024-12-07 MED ORDER — ALUM & MAG HYDROXIDE-SIMETH 200-200-20 MG/5ML PO SUSP: 30.0000 mL | ORAL | Status: DC | PRN

## 2024-12-07 MED ORDER — NICOTINE 14 MG/24HR TD PT24
14.0000 mg | MEDICATED_PATCH | Freq: Every day | TRANSDERMAL | Status: DC
  Administered 2024-12-07: 14 mg via TRANSDERMAL
  Filled 2024-12-07: qty 1

## 2024-12-07 MED ORDER — ALUM & MAG HYDROXIDE-SIMETH 200-200-20 MG/5ML PO SUSP
30.0000 mL | ORAL | Status: DC | PRN
  Administered 2024-12-10: 14:00:00 30 mL via ORAL
  Filled 2024-12-07: qty 30

## 2024-12-07 MED ORDER — ONDANSETRON 4 MG PO TBDP
4.0000 mg | ORAL_TABLET | Freq: Four times a day (QID) | ORAL | Status: DC | PRN
  Administered 2024-12-08: 10:00:00 4 mg via ORAL
  Filled 2024-12-07: qty 1

## 2024-12-07 MED ORDER — VITAMIN B-1 100 MG PO TABS
100.0000 mg | ORAL_TABLET | Freq: Every day | ORAL | Status: AC
  Administered 2024-12-08 – 2024-12-11 (×4): 100 mg via ORAL
  Filled 2024-12-07 (×4): qty 1

## 2024-12-07 MED ORDER — HALOPERIDOL 5 MG PO TABS: 5.0000 mg | ORAL_TABLET | Freq: Three times a day (TID) | ORAL | Status: DC | PRN

## 2024-12-07 MED ORDER — LORAZEPAM 1 MG PO TABS: 1.0000 mg | ORAL_TABLET | Freq: Four times a day (QID) | ORAL | Status: DC | PRN

## 2024-12-07 MED ORDER — THIAMINE MONONITRATE 100 MG PO TABS: 100.0000 mg | ORAL_TABLET | Freq: Every day | ORAL | Status: DC

## 2024-12-07 MED ORDER — DIPHENHYDRAMINE HCL 50 MG/ML IJ SOLN: 50.0000 mg | Freq: Three times a day (TID) | INTRAMUSCULAR | Status: DC | PRN

## 2024-12-07 MED ORDER — OLANZAPINE 5 MG PO TABS
5.0000 mg | ORAL_TABLET | Freq: Once | ORAL | Status: AC
  Administered 2024-12-07: 5 mg via ORAL
  Filled 2024-12-07: qty 1

## 2024-12-07 MED ORDER — ADULT MULTIVITAMIN W/MINERALS CH
1.0000 | ORAL_TABLET | Freq: Every day | ORAL | Status: DC
  Administered 2024-12-08 – 2024-12-11 (×4): 1 via ORAL
  Filled 2024-12-07 (×4): qty 1

## 2024-12-07 MED ORDER — DIPHENHYDRAMINE HCL 25 MG PO CAPS: 50.0000 mg | ORAL_CAPSULE | Freq: Three times a day (TID) | ORAL | Status: DC | PRN

## 2024-12-07 MED ORDER — LOPERAMIDE HCL 2 MG PO CAPS: 2.0000 mg | ORAL_CAPSULE | ORAL | Status: AC | PRN

## 2024-12-07 MED ORDER — VITAMIN B-1 100 MG PO TABS: 100.0000 mg | ORAL_TABLET | Freq: Every day | ORAL | Status: DC

## 2024-12-07 MED ORDER — THIAMINE HCL 100 MG/ML IJ SOLN: 100.0000 mg | Freq: Once | INTRAMUSCULAR | Status: DC

## 2024-12-07 MED ORDER — ADULT MULTIVITAMIN W/MINERALS CH
1.0000 | ORAL_TABLET | Freq: Every day | ORAL | Status: DC
  Administered 2024-12-07: 1 via ORAL
  Filled 2024-12-07: qty 1

## 2024-12-07 MED ORDER — LISINOPRIL 5 MG PO TABS
5.0000 mg | ORAL_TABLET | Freq: Every day | ORAL | Status: AC
  Administered 2024-12-08 – 2024-12-11 (×4): 5 mg via ORAL
  Filled 2024-12-07 (×4): qty 1

## 2024-12-07 MED ORDER — OLANZAPINE 5 MG PO TABS: 5.0000 mg | ORAL_TABLET | Freq: Every day | ORAL | Status: DC

## 2024-12-07 MED ORDER — LOPERAMIDE HCL 2 MG PO CAPS: 2.0000 mg | ORAL_CAPSULE | ORAL | Status: DC | PRN

## 2024-12-07 MED ORDER — LORAZEPAM 1 MG PO TABS
1.0000 mg | ORAL_TABLET | Freq: Four times a day (QID) | ORAL | Status: DC | PRN
  Administered 2024-12-07 – 2024-12-08 (×2): 1 mg via ORAL
  Filled 2024-12-07 (×2): qty 1

## 2024-12-07 MED ORDER — THIAMINE MONONITRATE 100 MG PO TABS
100.0000 mg | ORAL_TABLET | Freq: Every day | ORAL | Status: DC
  Administered 2024-12-07: 100 mg via ORAL
  Filled 2024-12-07: qty 1

## 2024-12-07 NOTE — Group Note (Signed)
 Date:  12/07/2024 Time:  9:27 PM  Group Topic/Focus:  Coping With Mental Health Crisis:   The purpose of this group is to help patients identify strategies for coping with mental health crisis.  Group discusses possible causes of crisis and ways to manage them effectively.    Pt did not attend group.  Lothar Prehn L 12/07/2024, 9:27 PM

## 2024-12-07 NOTE — ED Provider Notes (Signed)
 Behavioral Health Progress Note  Date and Time: 12/07/2024 3:09 PM Name: Carlos French MRN:  [id number]  HPI: Per initial assessment: Carlos French is a 33 y/o male with a psychiatric history of MDD; GAD; PTSD; Alcohol use disorder; Polysubstance abuse presented to [facility] via [facility] after calling the police because he was feeling scared that the cartel was after him and the FBI has him under surveillance.  Patient was assessed by previous provider, and first examination was completed, and IVC upheld, with recommendations made for inpatient hospitalization for treatment of stabilization of patient's mental status.  Assessment by clinical research associate: During encounter with patient, he continues to present with psychosis; he is paranoid, hyperfocused on his mother using his identity to ruin my life. He perseverates about how he hates his mother, then moves on to talk about how people are doing things to his wife, states Y'all are evil as hell. He denies SI, denies AH, denied VH. Reports poor sleep x several days now. States that he does not want to keep repeating himself when asked questions related to his home life and reasons for presentation to this location today.   Patient initially apprehensive about allowing for labs to be drawn, and allowing for skin checks, but he allowed for both after the rationales & benefits were explained to him. Patient is also receptive to going inpatient at this time, states that the [facility] was beneficial to him the last time that he was there and shares that he would like to go back there for mental health treatment and stabilization.  Recommendations remains inpatient MH treatment and stabilization of symptoms at this time. IVC is also being upheld and we will transfer with chain of custody in place after securing an inpatient behavioral health bed.  Medication adjustments: Discontinued Zyprexa  due to poor metabolic profile & risk for non compliance r/t possible side  effects in the future as pt states he does not want to gain weight on medications. Ordered Invega 3 mg BID for mood stabilization & psychosis, with pt accepting the starting of an LAI prior to discharge from the inpatient unit r/t to his poor history of compliance.  Diagnosis:  Final diagnoses:  MDD (major depressive disorder), single episode, severe with psychotic features (HCC)    Total Time spent with patient: 1 hour  Past Psychiatric History: MDD, GAD, polysubstance abuse Past Medical History: denies Family History: not provided Family Psychiatric  History: not provided Social History: resides with mother, states that he is not currently working, and is a poor historian at this time, unable to accurately provide history.  Additional Social History:    Pain Medications: See MAR Prescriptions: See MAR Over the Counter: See MAR History of alcohol / drug use?: Yes Longest period of sobriety (when/how long): several months while in treatment - 2018 ......     Pt currently reports drinking one 24 oz can last night Negative Consequences of Use: Personal relationships, Work / Programmer, Multimedia, Surveyor, Quantity Withdrawal Symptoms: Other (Comment) (uta)      Sleep: Poor  Appetite:  Good  Current Medications:  Current Facility-Administered Medications  Medication Dose Route Frequency Provider Last Rate Last Admin   alum & mag hydroxide-simeth (MAALOX/MYLANTA) 200-200-20 MG/5ML suspension 30 mL  30 mL Oral Q4H PRN Bobbitt, Shalon E, NP       haloperidol  (HALDOL ) tablet 5 mg  5 mg Oral TID PRN Bobbitt, Shalon E, NP       And   diphenhydrAMINE  (BENADRYL ) capsule 50 mg  50 mg Oral TID PRN Bobbitt, Shalon E, NP       haloperidol  lactate (HALDOL ) injection 5 mg  5 mg Intramuscular TID PRN Bobbitt, Shalon E, NP       And   diphenhydrAMINE  (BENADRYL ) injection 50 mg  50 mg Intramuscular TID PRN Bobbitt, Shalon E, NP       And   LORazepam  (ATIVAN ) injection 2 mg  2 mg Intramuscular TID PRN Bobbitt, Shalon  E, NP       haloperidol  lactate (HALDOL ) injection 10 mg  10 mg Intramuscular TID PRN Bobbitt, Shalon E, NP       And   diphenhydrAMINE  (BENADRYL ) injection 50 mg  50 mg Intramuscular TID PRN Bobbitt, Shalon E, NP       And   LORazepam  (ATIVAN ) injection 2 mg  2 mg Intramuscular TID PRN Bobbitt, Shalon E, NP       hydrOXYzine  (ATARAX ) tablet 25 mg  25 mg Oral TID PRN Bobbitt, Shalon E, NP       lisinopril  (ZESTRIL ) tablet 5 mg  5 mg Oral Daily Bobbitt, Shalon E, NP   5 mg at 12/07/24 1015   magnesium  hydroxide (MILK OF MAGNESIA) suspension 30 mL  30 mL Oral Daily PRN Bobbitt, Shalon E, NP       nicotine  (NICODERM CQ  - dosed in mg/24 hours) patch 14 mg  14 mg Transdermal Daily Bobbitt, Shalon E, NP   14 mg at 12/07/24 1015   OLANZapine  (ZYPREXA ) tablet 5 mg  5 mg Oral QHS Bobbitt, Shalon E, NP       ondansetron  (ZOFRAN -ODT) disintegrating tablet 4 mg  4 mg Oral Q6H PRN Tuwanna Krausz, NP       thiamine  (VITAMIN B1) tablet 100 mg  100 mg Oral Daily Bobbitt, Shalon E, NP   100 mg at 12/07/24 1015   traZODone  (DESYREL ) tablet 50 mg  50 mg Oral QHS PRN Bobbitt, Shalon E, NP       Current Outpatient Medications  Medication Sig Dispense Refill   lisinopril  (ZESTRIL ) 5 MG tablet Take 1 tablet (5 mg total) by mouth daily. 30 tablet 0   nicotine  (NICODERM CQ  - DOSED IN MG/24 HOURS) 14 mg/24hr patch Place 1 patch (14 mg total) onto the skin daily. 28 patch 0   OLANZapine  (ZYPREXA ) 5 MG tablet Take 1 tablet (5 mg total) by mouth at bedtime. 30 tablet 0   thiamine  (VITAMIN B-1) 100 MG tablet Take 1 tablet (100 mg total) by mouth daily.      Labs  Lab Results:  Admission on 12/07/2024  Component Date Value Ref Range Status   WBC 12/07/2024 6.9  4.0 - 10.5 K/uL Final   RBC 12/07/2024 4.86  4.22 - 5.81 MIL/uL Final   Hemoglobin 12/07/2024 15.9  13.0 - 17.0 g/dL Final   HCT 87/85/7974 45.7  39.0 - 52.0 % Final   MCV 12/07/2024 94.0  80.0 - 100.0 fL Final   MCH 12/07/2024 32.7  26.0 - 34.0 pg Final    MCHC 12/07/2024 34.8  30.0 - 36.0 g/dL Final   RDW 87/85/7974 12.2  11.5 - 15.5 % Final   Platelets 12/07/2024 347  150 - 400 K/uL Final   nRBC 12/07/2024 0.0  0.0 - 0.2 % Final   Neutrophils Relative % 12/07/2024 61  % Final   Neutro Abs 12/07/2024 4.2  1.7 - 7.7 K/uL Final   Lymphocytes Relative 12/07/2024 23  % Final   Lymphs Abs 12/07/2024 1.6  0.7 -  4.0 K/uL Final   Monocytes Relative 12/07/2024 10  % Final   Monocytes Absolute 12/07/2024 0.7  0.1 - 1.0 K/uL Final   Eosinophils Relative 12/07/2024 5  % Final   Eosinophils Absolute 12/07/2024 0.3  0.0 - 0.5 K/uL Final   Basophils Relative 12/07/2024 1  % Final   Basophils Absolute 12/07/2024 0.1  0.0 - 0.1 K/uL Final   Immature Granulocytes 12/07/2024 0  % Final   Abs Immature Granulocytes 12/07/2024 0.03  0.00 - 0.07 K/uL Final   Performed at [facility], [address] [place], [place] [address]   Sodium 12/07/2024 136  135 - 145 mmol/L Final   Potassium 12/07/2024 3.9  3.5 - 5.1 mmol/L Final   Chloride 12/07/2024 100  98 - 111 mmol/L Final   CO2 12/07/2024 29  22 - 32 mmol/L Final   Glucose, Bld 12/07/2024 73  70 - 99 mg/dL Final   Glucose reference range applies only to samples taken after fasting for at least 8 hours.   BUN 12/07/2024 11  6 - 20 mg/dL Final   Creatinine, Ser 12/07/2024 0.68  0.61 - 1.24 mg/dL Final   Calcium  12/07/2024 9.5  8.9 - 10.3 mg/dL Final   Total Protein 87/85/7974 7.9  6.5 - 8.1 g/dL Final   Albumin 87/85/7974 4.7  3.5 - 5.0 g/dL Final   AST 87/85/7974 19  15 - 41 U/L Final   ALT 12/07/2024 18  0 - 44 U/L Final   Alkaline Phosphatase 12/07/2024 79  38 - 126 U/L Final   Total Bilirubin 12/07/2024 0.5  0.0 - 1.2 mg/dL Final   GFR, Estimated 12/07/2024 >60  >60 mL/min Final   Comment: (NOTE) Calculated using the CKD-EPI Creatinine Equation (2021)    Anion gap 12/07/2024 7  5 - 15 Final   Performed at [facility], [address], [place], [place] [address]   Alcohol, Ethyl (B)  12/07/2024 <15  <15 mg/dL Final   Comment: (NOTE) For medical purposes only. Performed at [facility], [address], [place], [place] [address]    POC Amphetamine UR 12/07/2024 None Detected  NONE DETECTED (Cut Off Level 1000 ng/mL) Final   POC Secobarbital (BAR) 12/07/2024 None Detected  NONE DETECTED (Cut Off Level 300 ng/mL) Final   POC Buprenorphine (BUP) 12/07/2024 None Detected  NONE DETECTED (Cut Off Level 10 ng/mL) Final   POC Oxazepam (BZO) 12/07/2024 Positive (A)  NONE DETECTED (Cut Off Level 300 ng/mL) Final   POC Cocaine UR 12/07/2024 Positive (A)  NONE DETECTED (Cut Off Level 300 ng/mL) Final   POC Methamphetamine UR 12/07/2024 None Detected  NONE DETECTED (Cut Off Level 1000 ng/mL) Final   POC Morphine 12/07/2024 None Detected  NONE DETECTED (Cut Off Level 300 ng/mL) Final   POC Methadone UR 12/07/2024 None Detected  NONE DETECTED (Cut Off Level 300 ng/mL) Final   POC Oxycodone UR 12/07/2024 None Detected  NONE DETECTED (Cut Off Level 100 ng/mL) Final   POC Marijuana UR 12/07/2024 Positive (A)  NONE DETECTED (Cut Off Level 50 ng/mL) Final   Vitamin B-12 12/07/2024 281  180 - 914 pg/mL Final   Comment: (NOTE) This assay is not validated for testing neonatal or myeloproliferative syndrome specimens for Vitamin B12 levels. Performed at [facility], [address] [place], [place] [address]   Admission on 11/24/2024, Discharged on 11/29/2024  Component Date Value Ref Range Status   Sodium 11/26/2024 139  135 - 145 mmol/L  Final   Potassium 11/26/2024 4.3  3.5 - 5.1 mmol/L Final   Chloride 11/26/2024 104  98 - 111 mmol/L Final   CO2 11/26/2024 28  22 - 32 mmol/L Final   Glucose, Bld 11/26/2024 108 (H)  70 - 99 mg/dL Final   Glucose reference range applies only to samples taken after fasting for at least 8 hours.   BUN 11/26/2024 10  6 - 20 mg/dL Final   Creatinine, Ser 11/26/2024 0.80  0.61 - 1.24 mg/dL Final   Calcium  11/26/2024 9.2  8.9 - 10.3 mg/dL Final    GFR, Estimated 11/26/2024 >60  >60 mL/min Final   Comment: (NOTE) Calculated using the CKD-EPI Creatinine Equation (2021)    Anion gap 11/26/2024 7  5 - 15 Final   Performed at [facility], [address] [place], [place] [address]  Admission on 11/24/2024, Discharged on 11/24/2024  Component Date Value Ref Range Status   WBC 11/24/2024 7.8  4.0 - 10.5 K/uL Final   RBC 11/24/2024 4.78  4.22 - 5.81 MIL/uL Final   Hemoglobin 11/24/2024 15.8  13.0 - 17.0 g/dL Final   HCT 87/98/7974 45.4  39.0 - 52.0 % Final   MCV 11/24/2024 95.0  80.0 - 100.0 fL Final   MCH 11/24/2024 33.1  26.0 - 34.0 pg Final   MCHC 11/24/2024 34.8  30.0 - 36.0 g/dL Final   RDW 87/98/7974 12.3  11.5 - 15.5 % Final   Platelets 11/24/2024 299  150 - 400 K/uL Final   nRBC 11/24/2024 0.0  0.0 - 0.2 % Final   Neutrophils Relative % 11/24/2024 59  % Final   Neutro Abs 11/24/2024 4.6  1.7 - 7.7 K/uL Final   Lymphocytes Relative 11/24/2024 27  % Final   Lymphs Abs 11/24/2024 2.1  0.7 - 4.0 K/uL Final   Monocytes Relative 11/24/2024 10  % Final   Monocytes Absolute 11/24/2024 0.8  0.1 - 1.0 K/uL Final   Eosinophils Relative 11/24/2024 3  % Final   Eosinophils Absolute 11/24/2024 0.2  0.0 - 0.5 K/uL Final   Basophils Relative 11/24/2024 1  % Final   Basophils Absolute 11/24/2024 0.1  0.0 - 0.1 K/uL Final   Immature Granulocytes 11/24/2024 0  % Final   Abs Immature Granulocytes 11/24/2024 0.03  0.00 - 0.07 K/uL Final   Performed at [facility], [address] [place], [place] [address]   Sodium 11/24/2024 135  135 - 145 mmol/L Final   Potassium 11/24/2024 3.2 (L)  3.5 - 5.1 mmol/L Final   Chloride 11/24/2024 96 (L)  98 - 111 mmol/L Final   CO2 11/24/2024 26  22 - 32 mmol/L Final   Glucose, Bld 11/24/2024 67 (L)  70 - 99 mg/dL Final   Glucose reference range applies only to samples taken after fasting for at least 8 hours.   BUN 11/24/2024 9  6 - 20 mg/dL Final   Creatinine, Ser 11/24/2024  0.82  0.61 - 1.24 mg/dL Final   Calcium  11/24/2024 9.2  8.9 - 10.3 mg/dL Final   Total Protein 87/98/7974 7.8  6.5 - 8.1 g/dL Final   Albumin 87/98/7974 4.5  3.5 - 5.0 g/dL Final   AST 87/98/7974 21  15 - 41 U/L Final   ALT 11/24/2024 13  0 - 44 U/L Final   Alkaline Phosphatase 11/24/2024 68  38 - 126 U/L Final   Total Bilirubin 11/24/2024 0.6  0.0 - 1.2 mg/dL Final   GFR, Estimated 11/24/2024 >60  >  60 mL/min Final   Comment: (NOTE) Calculated using the CKD-EPI Creatinine Equation (2021)    Anion gap 11/24/2024 13  5 - 15 Final   Performed at [facility], [address], [place], [place] [address]   Hgb A1c MFr Bld 11/24/2024 5.1  4.8 - 5.6 % Final   Comment: (NOTE)         Prediabetes: 5.7 - 6.4         Diabetes: >6.4         Glycemic control for adults with diabetes: <7.0    Mean Plasma Glucose 11/24/2024 100  mg/dL Final   Comment: (NOTE) Performed At: [facility] [address] [place], [place] [id number] Jennette Shorter MD Ey:[id number]    Alcohol, Ethyl (B) 11/24/2024 94 (H)  <15 mg/dL Final   Comment: (NOTE) For medical purposes only. Performed at [facility], [address] [place], [place] [address]    Cholesterol 11/24/2024 183  0 - 200 mg/dL Final   Triglycerides 87/98/7974 91  <150 mg/dL Final   HDL 87/98/7974 74  >40 mg/dL Final   Total CHOL/HDL Ratio 11/24/2024 2.5  RATIO Final   VLDL 11/24/2024 18  0 - 40 mg/dL Final   LDL Cholesterol 11/24/2024 91  0 - 99 mg/dL Final   Comment:        Total Cholesterol/HDL:CHD Risk Coronary Heart Disease Risk Table                     Men   Women  1/2 Average Risk   3.4   3.3  Average Risk       5.0   4.4  2 X Average Risk   9.6   7.1  3 X Average Risk  23.4   11.0        Use the calculated Patient Ratio above and the CHD Risk Table to determine the patient's CHD Risk.        ATP III CLASSIFICATION (LDL):  <100     mg/dL   Optimal  899-870  mg/dL   Near or Above                    Optimal   130-159  mg/dL   Borderline  839-810  mg/dL   High  >809     mg/dL   Very High Performed at [facility], [address], [place], [place] [address]    TSH 11/24/2024 1.304  0.350 - 4.500 uIU/mL Final   Comment: Performed by a 3rd Generation assay with a functional sensitivity of <=0.01 uIU/mL. Performed at [facility], [address] [place], [place] [address]    POC Amphetamine UR 11/24/2024 None Detected  NONE DETECTED (Cut Off Level 1000 ng/mL) Final   POC Secobarbital (BAR) 11/24/2024 None Detected  NONE DETECTED (Cut Off Level 300 ng/mL) Final   POC Buprenorphine (BUP) 11/24/2024 None Detected  NONE DETECTED (Cut Off Level 10 ng/mL) Final   POC Oxazepam (BZO) 11/24/2024 None Detected  NONE DETECTED (Cut Off Level 300 ng/mL) Final   POC Cocaine UR 11/24/2024 Positive (A)  NONE DETECTED (Cut Off Level 300 ng/mL) Final   POC Methamphetamine UR 11/24/2024 None Detected  NONE DETECTED (Cut Off Level 1000 ng/mL) Final   POC Morphine 11/24/2024 None Detected  NONE DETECTED (Cut Off Level 300 ng/mL) Final   POC Methadone UR 11/24/2024 None Detected  NONE DETECTED (Cut Off Level 300 ng/mL) Final  POC Oxycodone UR 11/24/2024 None Detected  NONE DETECTED (Cut Off Level 100 ng/mL) Final   POC Marijuana UR 11/24/2024 Positive (A)  NONE DETECTED (Cut Off Level 50 ng/mL) Final  Admission on 09/07/2024, Discharged on 09/11/2024  Component Date Value Ref Range Status   Sodium 09/08/2024 132 (L)  135 - 145 mmol/L Final   Potassium 09/08/2024 4.4  3.5 - 5.1 mmol/L Final   Chloride 09/08/2024 91 (L)  98 - 111 mmol/L Final   CO2 09/08/2024 28  22 - 32 mmol/L Final   Glucose, Bld 09/08/2024 158 (H)  70 - 99 mg/dL Final   Glucose reference range applies only to samples taken after fasting for at least 8 hours.   BUN 09/08/2024 16  6 - 20 mg/dL Final   Creatinine, Ser 09/08/2024 0.98  0.61 - 1.24 mg/dL Final   Calcium  09/08/2024 10.3  8.9 - 10.3 mg/dL Final   Total Protein 90/84/7974 8.2  (H)  6.5 - 8.1 g/dL Final   Albumin 90/84/7974 4.7  3.5 - 5.0 g/dL Final   AST 90/84/7974 25  15 - 41 U/L Final   ALT 09/08/2024 17  0 - 44 U/L Final   Alkaline Phosphatase 09/08/2024 74  38 - 126 U/L Final   Total Bilirubin 09/08/2024 1.0  0.0 - 1.2 mg/dL Final   GFR, Estimated 09/08/2024 >60  >60 mL/min Final   Comment: (NOTE) Calculated using the CKD-EPI Creatinine Equation (2021)    Anion gap 09/08/2024 13  5 - 15 Final   Performed at [facility], [address] [place], [place] [address]   RPR Ser Ql 09/08/2024 NON REACTIVE  NON REACTIVE Final   Performed at [facility], [address], [place], [place] [address]   Hepatitis B Surface Ag 09/08/2024 NON REACTIVE  NON REACTIVE Final   HCV Ab 09/08/2024 NON REACTIVE  NON REACTIVE Final   Comment: (NOTE) Nonreactive HCV antibody screen is consistent with no HCV infections,  unless recent infection is suspected or other evidence exists to indicate HCV infection.     Hep A IgM 09/08/2024 NON REACTIVE  NON REACTIVE Final   Hep B C IgM 09/08/2024 NON REACTIVE  NON REACTIVE Final   Performed at [facility], [address] [place], [place] [address]   TSH 09/08/2024 5.382 (H)  0.350 - 4.500 uIU/mL Final   Comment: Performed by a 3rd Generation assay with a functional sensitivity of <=0.01 uIU/mL. Performed at [facility], [address], [place], [place] [address]   Admission on 09/07/2024, Discharged on 09/07/2024  Component Date Value Ref Range Status   Sodium 09/07/2024 134 (L)  135 - 145 mmol/L Final   Potassium 09/07/2024 4.2  3.5 - 5.1 mmol/L Final   Chloride 09/07/2024 100  98 - 111 mmol/L Final   CO2 09/07/2024 20 (L)  22 - 32 mmol/L Final   Glucose, Bld 09/07/2024 87  70 - 99 mg/dL Final   Glucose reference range applies only to samples taken after fasting for at least 8 hours.   BUN 09/07/2024 6  6 - 20 mg/dL Final   Creatinine, Ser 09/07/2024 0.72  0.61 - 1.24 mg/dL Final   Calcium  09/07/2024  9.1  8.9 - 10.3 mg/dL Final   Total Protein 90/85/7974 7.8  6.5 - 8.1 g/dL Final   Albumin 90/85/7974 4.4  3.5 - 5.0 g/dL Final   AST 90/85/7974 22  15 - 41 U/L Final   ALT 09/07/2024  14  0 - 44 U/L Final   Alkaline Phosphatase 09/07/2024 67  38 - 126 U/L Final   Total Bilirubin 09/07/2024 0.7  0.0 - 1.2 mg/dL Final   GFR, Estimated 09/07/2024 >60  >60 mL/min Final   Comment: (NOTE) Calculated using the CKD-EPI Creatinine Equation (2021)    Anion gap 09/07/2024 14  5 - 15 Final   Performed at [facility], [address] [place], [place] [address]   Alcohol, Ethyl (B) 09/07/2024 <15  <15 mg/dL Final   Comment: (NOTE) For medical purposes only. Performed at [facility], [address] [place], [place] [address]    WBC 09/07/2024 6.4  4.0 - 10.5 K/uL Final   RBC 09/07/2024 4.78  4.22 - 5.81 MIL/uL Final   Hemoglobin 09/07/2024 15.5  13.0 - 17.0 g/dL Final   HCT 90/85/7974 46.5  39.0 - 52.0 % Final   MCV 09/07/2024 97.3  80.0 - 100.0 fL Final   MCH 09/07/2024 32.4  26.0 - 34.0 pg Final   MCHC 09/07/2024 33.3  30.0 - 36.0 g/dL Final   RDW 90/85/7974 13.2  11.5 - 15.5 % Final   Platelets 09/07/2024 266  150 - 400 K/uL Final   nRBC 09/07/2024 0.0  0.0 - 0.2 % Final   Performed at [facility], [address] [place], [place] [address]   Opiates 09/07/2024 NONE DETECTED  NONE DETECTED Final   Cocaine 09/07/2024 POSITIVE (A)  NONE DETECTED Final   Benzodiazepines 09/07/2024 NONE DETECTED  NONE DETECTED Final   Amphetamines 09/07/2024 POSITIVE (A)  NONE DETECTED Final   Comment: (NOTE) Trazodone  is metabolized in vivo to several metabolites, including pharmacologically active m-CPP, which is excreted in the urine. Immunoassay screens for amphetamines and MDMA have potential cross-reactivity with these compounds and may provide false positive  results.     Tetrahydrocannabinol 09/07/2024 POSITIVE (A)  NONE DETECTED Final   Barbiturates 09/07/2024 NONE DETECTED   NONE DETECTED Final   Comment: (NOTE) DRUG SCREEN FOR MEDICAL PURPOSES ONLY.  IF CONFIRMATION IS NEEDED FOR ANY PURPOSE, NOTIFY LAB WITHIN 5 DAYS.  LOWEST DETECTABLE LIMITS FOR URINE DRUG SCREEN Drug Class                     Cutoff (ng/mL) Amphetamine and metabolites    1000 Barbiturate and metabolites    200 Benzodiazepine                 200 Opiates and metabolites        300 Cocaine and metabolites        300 THC                            50 Performed at [facility], [address] [place], [place] [address]     Blood Alcohol level:  Lab Results  Component Value Date   ETH <15 12/07/2024   ETH 94 (H) 11/24/2024    Metabolic Disorder Labs: Lab Results  Component Value Date   HGBA1C 5.1 11/24/2024   MPG 100 11/24/2024   Lab Results  Component Value Date   PROLACTIN 32.3 (H) 02/10/2016   Lab Results  Component Value Date   CHOL 183 11/24/2024   TRIG 91 11/24/2024   HDL 74 11/24/2024   CHOLHDL 2.5 11/24/2024   VLDL 18 11/24/2024   LDLCALC 91 11/24/2024   LDLCALC 99 02/10/2016    Therapeutic  Lab Levels: No results found for: LITHIUM  No results found for: VALPROATE No results found for: CBMZ  Physical Findings   AIMS    Flowsheet Row [facility]) [facility] Ad[facility] (Discharged) [facility] Admission (Discharged) from [facility] Admission (Discharged) from 09[facility]15[facility] Admission (Discharged) from [facility]  AIMS Total Score 0 0 0 0 0   AUDIT    Flowsheet Row Admission (Discharged) [facility] Admission (Discharged) from [facility]12/[facility] Admission (Discharged) from 09[facility]29[facility] Admission (Discharged) from 09[facility]15/[facility] Admission (Discharged) from 11[facility]03[facility]2017 [facility]  Alcohol Use Disorder Identification Test Final Score (AUDIT) 2 35 34 35 40   Flowsheet Row ED from 12/07/2024 in [facility] Admission (Discharged) [facility] ED from 09/07/2024 in [facility]  C-SSRS RISK CATEGORY No Risk High Risk High Risk     Musculoskeletal  Strength & Muscle Tone: within normal limits Gait & Station: normal Patient leans: N/A  Psychiatric Specialty Exam  Presentation  General Appearance:  Fairly Groomed  Eye Contact: Fair  Speech: Clear and Coherent  Speech Volume: Normal  Handedness: Right   Mood and Affect  Mood: Anxious; Depressed  Affect: Congruent   Thought Process  Thought Processes: Disorganized  Descriptions of Associations:Intact  Orientation:Partial  Thought Content:Logical  Diagnosis of Schizophrenia or Schizoaffective disorder in past: No  Duration of Psychotic Symptoms: Less than six months   Hallucinations:Hallucinations: None  Ideas of Reference:Paranoia; Percusatory  Suicidal Thoughts:Suicidal Thoughts: No SI Active Intent and/or Plan: Without Intent; Without Plan  Homicidal Thoughts:Homicidal Thoughts: No   Sensorium  Memory: Immediate Poor  Judgment: Poor  Insight: Poor   Executive Functions  Concentration: Poor  Attention Span: Poor  Recall: Poor  Fund of Knowledge: Poor  Language: Poor   Psychomotor Activity  Psychomotor Activity: Psychomotor Activity: Normal   Assets  Assets: Desire for Improvement   Sleep  Sleep: Sleep: Poor  Estimated Sleeping Duration (Last 24 Hours): 0.00 hours  Nutritional Assessment (For OBS and [facility] admissions only) Has the  patient had a weight loss or gain of 10 pounds or more in the last 3 months?: No Has the patient had a decrease in food intake/or appetite?: No Does the patient have dental problems?: No Does the patient have eating habits or behaviors that may be indicators of an eating disorder including binging or inducing vomiting?: No Has the patient recently lost weight without trying?: 0 Has the patient been eating poorly because of a decreased appetite?: 0 Malnutrition Screening Tool Score: 0    Physical Exam  Physical Exam Vitals and nursing note reviewed.  HENT:     Head: Normocephalic.  Eyes:     Pupils: Pupils are equal, round, and reactive to light.  Musculoskeletal:        General: Normal range of motion.  Neurological:     General: No focal deficit present.     Mental Status: He is oriented to person, place, and time.    Review of Systems  Psychiatric/Behavioral:  Positive for depression, hallucinations and substance abuse. Negative for memory loss  and suicidal ideas. The patient is nervous/anxious and has insomnia.   All other systems reviewed and are negative.  Blood pressure 120/80, pulse (!) 103, temperature 98.5 F (36.9 C), temperature source Temporal, resp. rate 18, SpO2 100%. There is no height or weight on file to calculate BMI.  Treatment Plan Summary: Daily contact with patient to assess and evaluate symptoms and progress in treatment and Medication management  - Upholding involuntary commitment, referring patient for hospitalization in an inpatient behavioral health unit for treatment and stabilization of mental status.  Patient has been accepted by the [facility], and awaiting transfer to that unit, which is a higher level of care.  -Discontinued Zyprexa  due to poor metabolic profile - Started Invega 3 mg twice daily for psychosis and mood stabilization. - Other medications as per MAR-please see the MAR for details -Patient is positive for  Benzos-denies use, but prn Ativan  1 mg for CIWAs>10 placed.  Donia Snell, NP 12/07/2024 3:09 PM

## 2024-12-07 NOTE — ED Notes (Signed)
 Patient continuously refusing labwork, skin check, and medications. Patient states he doesn't need to be here, was asking for his scheduled 9 o'clock blood pressure medication. Writer asked to get BP so I can give BP med as scheduled. Patient initially refused, writer explained that he had asked for his BP medication and had also voiced concern about a stroke so we needed to check his BP. Patient agitated but allowed for BP to be taken. 120/8-0, patient then refused all scheduled medications including BP medication stating he doesn't need them. Provider Donia Snell, NP made aware. Environment secured, safety checks in place per facility policy.

## 2024-12-07 NOTE — Group Note (Signed)
 Date:  12/07/2024 Time:  7:32 PM  Group Topic/Focus:  Self Care:   The focus of this group is to help patients understand the importance of self-care in order to improve or restore emotional, physical, spiritual, interpersonal, and financial health. Time management/priority setting.      Participation Level:  Did Not Attend   Carlos French 12/07/2024, 7:32 PM
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
## 2024-12-07 NOTE — ED Notes (Signed)
 Patient admitted to observation unit d/t AMS. Irritable and required lots of persuasion during admission process. Skin assessment completed. Oriented to unit. Meal and drink offered. Patient alert & oriented x4. Denies intent to harm self or others when asked. Denies A/VH. Patient denies any physical complaints when asked. No acute distress noted. Support and encouragement provided. Routine safety checks conducted per facility protocol. Encouraged patient to notify staff if any thoughts of harm towards self or others arise. Patient verbalizes understanding and agreement.

## 2024-12-07 NOTE — ED Notes (Signed)
 Patient resting in lounger. Calm, collected at this time. Patient in no apparent acute distress. No inappropriate behaviors observed or reported at this time. Environment secured. Safety checks in place per facility protocol.

## 2024-12-07 NOTE — ED Notes (Signed)
 Patient transferred to [facility] under IVC status. Patient denied SI/HI/AVH upon discharge, no acute distress noted at this time. Patient belongings returned complete and intact. Patient escorted to back [place] via security for transport to their destination. Safety maintained.

## 2024-12-07 NOTE — ED Notes (Signed)
 Patient reported off to [facility] that he felt as if he was having a stroke. Writer assessed patient face to face. No facial droop, slurred speech or limb drift noted. Patient able to follow movement with eyes appropriately. Patient seen sitting slumped in chair, responding appropriately. Patient reporting tingling and numbness along left side of face, in LUE and LLE. When assessing for push resistance in upper extremities there was a notable difference in L side. Providers Sherrell Culver, NP and Donia Snell, NP made aware. Patient visualized ambulating in room with no difficulties. Environment secured, safety checks in place per facility policy.

## 2024-12-07 NOTE — ED Notes (Signed)
 Skin check completed with NP and MH; patient dressed out in blue scrubs. Bloodwork obtained and sent off via DASH (order #[id number]). Patient brought onto unit.

## 2024-12-07 NOTE — Plan of Care (Signed)
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
## 2024-12-07 NOTE — ED Provider Notes (Signed)
 [facility] Continuous Assessment Admission H&P  Date: 12/07/2024 Patient Name: Carlos French MRN: [id number] Chief Complaint: the cartel and the fbi are after him   Diagnoses:  Final diagnoses:  MDD (major depressive disorder), single episode, severe with psychotic features [facility])    HPI: Carlos French is a 33 y/o male with a psychiatric history of MDD; GAD; PTSD; Alcohol use disorder; Polysubstance abuse presented to [facility] via [facility] after calling the police because he was feeling scared that the cartel was after him and the FBI has him under surveillance.   Patient presents very disheveled with bizarre affect tonight to [facility] with pressured, tangential speech and delusional thought process and paranoia. Patient reports that his phone is being bugged and he is under surveillance by the FBI and the cartel is after him and his ex girlfriend is stalking him. Patient reports that his ex girlfriend and her current boyfriend are involved with the cartel and that is why the cartel is after him. Patient states that his ex-girlfriend and his mother are in a cult. Patient reports someone has bugged his mother's home through her wifi. Patient report the cartel is sending him pictures of dismembered bodies to his phone. Patient reports that he has been arguing with his mother all day and left home to go stay in a motel. Patient reports that he drank 24 ounces of beer four before arriving to [facility]. Patient reports that last week he attempted to hang himself.  Patient called the police to bring him to [facility] for help. Patient reports that he has not been sleeping or taken his psychiatric medication. Patient reports that he attempted to hang himself last week.  Patient denies any SI/HI or AVH at this time. Patient denies he has used any substances, but states he draknk 24 ounces of alcohol few hours before her arrive to this facility.  Patient has a chronic and severe mental illness that can impair  judgment, perception, and behavior, especially when untreated or destabilized.   Patient has been IVCed and first exam completed and patient recommended for inpatient treatment and will be admitted to [facility] for crisis management, safety and stabilization.  Total Time spent with patient: 30 minutes  Musculoskeletal  Strength & Muscle Tone: within normal limits Gait & Station: normal Patient leans: N/A  Psychiatric Specialty Exam  Presentation General Appearance:  Disheveled; Bizarre  Eye Contact: Fair  Speech: Pressured  Speech Volume: Increased  Handedness: Right   Mood and Affect  Mood: Anxious; Labile  Affect: Congruent   Thought Process  Thought Processes: Disorganized  Descriptions of Associations:Loose  Orientation:Full (Time, Place and Person)  Thought Content:Delusions  Diagnosis of Schizophrenia or Schizoaffective disorder in past: No  Duration of Psychotic Symptoms: Greater than six months  Hallucinations:Hallucinations: None  Ideas of Reference:Paranoia  Suicidal Thoughts:Suicidal Thoughts: No SI Active Intent and/or Plan: Without Intent; Without Plan  Homicidal Thoughts:No data recorded  Sensorium  Memory: Immediate Fair; Recent Fair; Remote Fair  Judgment: Poor  Insight: Poor   Executive Functions  Concentration: Poor  Attention Span: Fair  Recall: [employer] of Knowledge: Fair  Language: Fair   Psychomotor Activity  Psychomotor Activity: Psychomotor Activity: Normal   Assets  Assets: Physical Health; Housing; Resilience   Sleep  Sleep: Sleep: Poor Number of Hours of Sleep: 0   Nutritional Assessment (For OBS and FBC admissions only) Has the patient had a weight loss or gain of 10 pounds or more in  the last 3 months?: No Has the patient had a decrease in food intake/or appetite?: No Does the patient have dental problems?: No Does the patient have eating habits or behaviors that may be indicators of  an eating disorder including binging or inducing vomiting?: No Has the patient recently lost weight without trying?: 0 Has the patient been eating poorly because of a decreased appetite?: 0 Malnutrition Screening Tool Score: 0    Physical Exam HENT:     Head: Normocephalic.     Nose: Nose normal.  Eyes:     Pupils: Pupils are equal, round, and reactive to light.  Cardiovascular:     Rate and Rhythm: Normal rate.     Pulses: Normal pulses.  Abdominal:     General: Abdomen is flat.  Musculoskeletal:        General: Normal range of motion.     Cervical back: Normal range of motion.  Skin:    General: Skin is warm.  Neurological:     Mental Status: He is alert and oriented to person, place, and time.  Psychiatric:        Attention and Perception: He does not perceive auditory or visual hallucinations.        Mood and Affect: Mood is anxious. Affect is labile.        Speech: Speech is rapid and pressured and tangential.        Behavior: Behavior is agitated.        Thought Content: Thought content is paranoid and delusional. Thought content does not include homicidal or suicidal ideation. Thought content does not include homicidal or suicidal plan.        Cognition and Memory: Cognition normal.        Judgment: Judgment is impulsive.    Review of Systems  Constitutional: Negative.   HENT: Negative.    Eyes: Negative.   Respiratory: Negative.    Cardiovascular: Negative.   Gastrointestinal: Negative.   Genitourinary: Negative.   Musculoskeletal: Negative.   Skin: Negative.   Neurological: Negative.   Psychiatric/Behavioral:  Positive for depression. The patient is nervous/anxious.     Blood pressure (!) 146/94, pulse (!) 103, temperature 98.5 F (36.9 C), temperature source Temporal, resp. rate 18, SpO2 100%. There is no height or weight on file to calculate BMI.  Past Psychiatric History: [facility] 12/01/025 to 11/29/24  Is the patient at risk to self? Yes  Has the patient  been a risk to self in the past 6 months? Yes .    Has the patient been a risk to self within the distant past? No   Is the patient a risk to others? No   Has the patient been a risk to others in the past 6 months? Yes   Has the patient been a risk to others within the distant past? Yes   Past Medical History:  Past Medical History:  Diagnosis Date   Allergy    Anxiety    Bipolar 1 disorder (HCC)    Depression    GERD (gastroesophageal reflux disease)    GI (gastrointestinal bleed)    GI bleeding    PTSD (post-traumatic stress disorder)    Suicidal ideation      Family History:  Family History  Problem Relation Age of Onset   Stroke Mother      Social History:  Social History   Socioeconomic History   Marital status: Single    Spouse name: Not on file   Number of children: Not  on file   Years of education: Not on file   Highest education level: Not on file  Occupational History   Not on file  Tobacco Use   Smoking status: Every Day    Current packs/day: 1.00    Types: Cigarettes   Smokeless tobacco: Former  Advertising Account Planner   Vaping status: Never Used  Substance and Sexual Activity   Alcohol use: Yes    Alcohol/week: 84.0 standard drinks of alcohol    Types: 84 Cans of beer per week   Drug use: Yes    Types: Marijuana, Cocaine    Comment: marijuana daily and crack cocain a few times a week   Sexual activity: Yes    Birth control/protection: None  Other Topics Concern   Not on file  Social History Narrative   Not on file   Social Drivers of Health   Tobacco Use: High Risk (11/24/2024)   Patient History    Smoking Tobacco Use: Every Day    Smokeless Tobacco Use: Former    Passive Exposure: Not on Actuary Strain: Not on file  Food Insecurity: Patient Declined (11/24/2024)   Epic    Worried About Programme Researcher, Broadcasting/film/video in the Last Year: Patient declined    Barista in the Last Year: Patient declined  Transportation Needs: Patient Declined  (11/24/2024)   Epic    Lack of Transportation (Medical): Patient declined    Lack of Transportation (Non-Medical): Patient declined  Physical Activity: Not on file  Stress: Not on file  Social Connections: Unknown (05/09/2022)   Received from [facility]   Social Network    Social Network: Not on file  Intimate Partner Violence: Patient Declined (11/24/2024)   Epic    Fear of Current or Ex-Partner: Patient declined    Emotionally Abused: Patient declined    Physically Abused: Patient declined    Sexually Abused: Patient declined  Depression (PHQ2-9): Not on file  Alcohol Screen: Low Risk (11/24/2024)   Alcohol Screen    Last Alcohol Screening Score (AUDIT): 2  Housing: Unknown (11/24/2024)   Epic    Unable to Pay for Housing in the Last Year: Not on file    Number of Times Moved in the Last Year: 0    Homeless in the Last Year: Not on file  Utilities: Patient Declined (11/24/2024)   Epic    Threatened with loss of utilities: Patient declined  Health Literacy: Not on file     Last Labs:  Admission on 11/24/2024, Discharged on 11/29/2024  Component Date Value Ref Range Status   Sodium 11/26/2024 139  135 - 145 mmol/L Final   Potassium 11/26/2024 4.3  3.5 - 5.1 mmol/L Final   Chloride 11/26/2024 104  98 - 111 mmol/L Final   CO2 11/26/2024 28  22 - 32 mmol/L Final   Glucose, Bld 11/26/2024 108 (H)  70 - 99 mg/dL Final   Glucose reference range applies only to samples taken after fasting for at least 8 hours.   BUN 11/26/2024 10  6 - 20 mg/dL Final   Creatinine, Ser 11/26/2024 0.80  0.61 - 1.24 mg/dL Final   Calcium  11/26/2024 9.2  8.9 - 10.3 mg/dL Final   GFR, Estimated 11/26/2024 >60  >60 mL/min Final   Comment: (NOTE) Calculated using the CKD-EPI Creatinine Equation (2021)    Anion gap 11/26/2024 7  5 - 15 Final   Performed at [facility], [address], [place], [place] [address]  Admission on 11/24/2024, Discharged on 11/24/2024  Component Date Value  Ref Range Status   WBC 11/24/2024 7.8  4.0 - 10.5 K/uL Final   RBC 11/24/2024 4.78  4.22 - 5.81 MIL/uL Final   Hemoglobin 11/24/2024 15.8  13.0 - 17.0 g/dL Final   HCT 87/98/7974 45.4  39.0 - 52.0 % Final   MCV 11/24/2024 95.0  80.0 - 100.0 fL Final   MCH 11/24/2024 33.1  26.0 - 34.0 pg Final   MCHC 11/24/2024 34.8  30.0 - 36.0 g/dL Final   RDW 87/98/7974 12.3  11.5 - 15.5 % Final   Platelets 11/24/2024 299  150 - 400 K/uL Final   nRBC 11/24/2024 0.0  0.0 - 0.2 % Final   Neutrophils Relative % 11/24/2024 59  % Final   Neutro Abs 11/24/2024 4.6  1.7 - 7.7 K/uL Final   Lymphocytes Relative 11/24/2024 27  % Final   Lymphs Abs 11/24/2024 2.1  0.7 - 4.0 K/uL Final   Monocytes Relative 11/24/2024 10  % Final   Monocytes Absolute 11/24/2024 0.8  0.1 - 1.0 K/uL Final   Eosinophils Relative 11/24/2024 3  % Final   Eosinophils Absolute 11/24/2024 0.2  0.0 - 0.5 K/uL Final   Basophils Relative 11/24/2024 1  % Final   Basophils Absolute 11/24/2024 0.1  0.0 - 0.1 K/uL Final   Immature Granulocytes 11/24/2024 0  % Final   Abs Immature Granulocytes 11/24/2024 0.03  0.00 - 0.07 K/uL Final   Performed at [facility], [address], [place], [place] [address]   Sodium 11/24/2024 135  135 - 145 mmol/L Final   Potassium 11/24/2024 3.2 (L)  3.5 - 5.1 mmol/L Final   Chloride 11/24/2024 96 (L)  98 - 111 mmol/L Final   CO2 11/24/2024 26  22 - 32 mmol/L Final   Glucose, Bld 11/24/2024 67 (L)  70 - 99 mg/dL Final   Glucose reference range applies only to samples taken after fasting for at least 8 hours.   BUN 11/24/2024 9  6 - 20 mg/dL Final   Creatinine, Ser 11/24/2024 0.82  0.61 - 1.24 mg/dL Final   Calcium  11/24/2024 9.2  8.9 - 10.3 mg/dL Final   Total Protein 87/98/7974 7.8  6.5 - 8.1 g/dL Final   Albumin 87/98/7974 4.5  3.5 - 5.0 g/dL Final   AST 87/98/7974 21  15 - 41 U/L Final   ALT 11/24/2024 13  0 - 44 U/L Final   Alkaline Phosphatase 11/24/2024 68  38 - 126 U/L Final   Total Bilirubin  11/24/2024 0.6  0.0 - 1.2 mg/dL Final   GFR, Estimated 11/24/2024 >60  >60 mL/min Final   Comment: (NOTE) Calculated using the CKD-EPI Creatinine Equation (2021)    Anion gap 11/24/2024 13  5 - 15 Final   Performed at [facility], [address] [place], [place] [address]   Hgb A1c MFr Bld 11/24/2024 5.1  4.8 - 5.6 % Final   Comment: (NOTE)         Prediabetes: 5.7 - 6.4         Diabetes: >6.4         Glycemic control for adults with diabetes: <7.0    Mean Plasma Glucose 11/24/2024 100  mg/dL Final   Comment: (NOTE) Performed At: [facility] [address] [place], [place] [id number] Jennette Shorter MD Ey:[id number]    Alcohol, Ethyl (B) 11/24/2024 94 (H)  <15 mg/dL Final   Comment: (NOTE) For medical purposes  only. Performed at [facility], [address] [place], [place] [address]    Cholesterol 11/24/2024 183  0 - 200 mg/dL Final   Triglycerides 87/98/7974 91  <150 mg/dL Final   HDL 87/98/7974 74  >40 mg/dL Final   Total CHOL/HDL Ratio 11/24/2024 2.5  RATIO Final   VLDL 11/24/2024 18  0 - 40 mg/dL Final   LDL Cholesterol 11/24/2024 91  0 - 99 mg/dL Final   Comment:        Total Cholesterol/HDL:CHD Risk Coronary Heart Disease Risk Table                     Men   Women  1/2 Average Risk   3.4   3.3  Average Risk       5.0   4.4  2 X Average Risk   9.6   7.1  3 X Average Risk  23.4   11.0        Use the calculated Patient Ratio above and the CHD Risk Table to determine the patient's CHD Risk.        ATP III CLASSIFICATION (LDL):  <100     mg/dL   Optimal  899-870  mg/dL   Near or Above                    Optimal  130-159  mg/dL   Borderline  839-810  mg/dL   High  >809     mg/dL   Very High Performed at [facility], [address], [place], [place] [address]    TSH 11/24/2024 1.304  0.350 - 4.500 uIU/mL Final   Comment: Performed by a 3rd Generation assay with a functional sensitivity of <=0.01 uIU/mL. Performed at [facility], [address], [place], [place] [address]    POC Amphetamine UR 11/24/2024 None Detected  NONE DETECTED (Cut Off Level 1000 ng/mL) Final   POC Secobarbital (BAR) 11/24/2024 None Detected  NONE DETECTED (Cut Off Level 300 ng/mL) Final   POC Buprenorphine (BUP) 11/24/2024 None Detected  NONE DETECTED (Cut Off Level 10 ng/mL) Final   POC Oxazepam (BZO) 11/24/2024 None Detected  NONE DETECTED (Cut Off Level 300 ng/mL) Final   POC Cocaine UR 11/24/2024 Positive (A)  NONE DETECTED (Cut Off Level 300 ng/mL) Final   POC Methamphetamine UR 11/24/2024 None Detected  NONE DETECTED (Cut Off Level 1000 ng/mL) Final   POC Morphine 11/24/2024 None Detected  NONE DETECTED (Cut Off Level 300 ng/mL) Final   POC Methadone UR 11/24/2024 None Detected  NONE DETECTED (Cut Off Level 300 ng/mL) Final   POC Oxycodone UR 11/24/2024 None Detected  NONE DETECTED (Cut Off Level 100 ng/mL) Final   POC Marijuana UR 11/24/2024 Positive (A)  NONE DETECTED (Cut Off Level 50 ng/mL) Final  Admission on 09/07/2024, Discharged on 09/11/2024  Component Date Value Ref Range Status   Sodium 09/08/2024 132 (L)  135 - 145 mmol/L Final   Potassium 09/08/2024 4.4  3.5 - 5.1 mmol/L Final   Chloride 09/08/2024 91 (L)  98 - 111 mmol/L Final   CO2 09/08/2024 28  22 - 32 mmol/L Final   Glucose, Bld 09/08/2024 158 (H)  70 - 99 mg/dL Final   Glucose reference range applies only to samples taken after fasting for at least 8 hours.   BUN 09/08/2024 16  6 - 20 mg/dL Final   Creatinine, Ser 09/08/2024 0.98  0.61 - 1.24 mg/dL Final  Calcium  09/08/2024 10.3  8.9 - 10.3 mg/dL Final   Total Protein 90/84/7974 8.2 (H)  6.5 - 8.1 g/dL Final   Albumin 90/84/7974 4.7  3.5 - 5.0 g/dL Final   AST 90/84/7974 25  15 - 41 U/L Final   ALT 09/08/2024 17  0 - 44 U/L Final   Alkaline Phosphatase 09/08/2024 74  38 - 126 U/L Final   Total Bilirubin 09/08/2024 1.0  0.0 - 1.2 mg/dL Final   GFR, Estimated 09/08/2024 >60  >60 mL/min Final   Comment:  (NOTE) Calculated using the CKD-EPI Creatinine Equation (2021)    Anion gap 09/08/2024 13  5 - 15 Final   Performed at [facility], [address] [place], [place] [address]   RPR Ser Ql 09/08/2024 NON REACTIVE  NON REACTIVE Final   Performed at [facility], [address], [place], [place] [address]   Hepatitis B Surface Ag 09/08/2024 NON REACTIVE  NON REACTIVE Final   HCV Ab 09/08/2024 NON REACTIVE  NON REACTIVE Final   Comment: (NOTE) Nonreactive HCV antibody screen is consistent with no HCV infections,  unless recent infection is suspected or other evidence exists to indicate HCV infection.     Hep A IgM 09/08/2024 NON REACTIVE  NON REACTIVE Final   Hep B C IgM 09/08/2024 NON REACTIVE  NON REACTIVE Final   Performed at [facility], [address] [place], [place] [address]   TSH 09/08/2024 5.382 (H)  0.350 - 4.500 uIU/mL Final   Comment: Performed by a 3rd Generation assay with a functional sensitivity of <=0.01 uIU/mL. Performed at [facility], [address], [place], [place] [address]   Admission on 09/07/2024, Discharged on 09/07/2024  Component Date Value Ref Range Status   Sodium 09/07/2024 134 (L)  135 - 145 mmol/L Final   Potassium 09/07/2024 4.2  3.5 - 5.1 mmol/L Final   Chloride 09/07/2024 100  98 - 111 mmol/L Final   CO2 09/07/2024 20 (L)  22 - 32 mmol/L Final   Glucose, Bld 09/07/2024 87  70 - 99 mg/dL Final   Glucose reference range applies only to samples taken after fasting for at least 8 hours.   BUN 09/07/2024 6  6 - 20 mg/dL Final   Creatinine, Ser 09/07/2024 0.72  0.61 - 1.24 mg/dL Final   Calcium  09/07/2024 9.1  8.9 - 10.3 mg/dL Final   Total Protein 90/85/7974 7.8  6.5 - 8.1 g/dL Final   Albumin 90/85/7974 4.4  3.5 - 5.0 g/dL Final   AST 90/85/7974 22  15 - 41 U/L Final   ALT 09/07/2024 14  0 - 44 U/L Final   Alkaline Phosphatase 09/07/2024 67  38 - 126 U/L Final   Total Bilirubin 09/07/2024 0.7  0.0 - 1.2 mg/dL Final    GFR, Estimated 09/07/2024 >60  >60 mL/min Final   Comment: (NOTE) Calculated using the CKD-EPI Creatinine Equation (2021)    Anion gap 09/07/2024 14  5 - 15 Final   Performed at [facility], [address] [place], [place] [address]   Alcohol, Ethyl (B) 09/07/2024 <15  <15 mg/dL Final   Comment: (NOTE) For medical purposes only. Performed at [facility], [address] [place], [place] [address]    WBC 09/07/2024 6.4  4.0 - 10.5 K/uL Final   RBC 09/07/2024 4.78  4.22 - 5.81 MIL/uL Final   Hemoglobin 09/07/2024 15.5  13.0 - 17.0 g/dL Final  HCT 09/07/2024 46.5  39.0 - 52.0 % Final   MCV 09/07/2024 97.3  80.0 - 100.0 fL Final   MCH 09/07/2024 32.4  26.0 - 34.0 pg Final   MCHC 09/07/2024 33.3  30.0 - 36.0 g/dL Final   RDW 90/85/7974 13.2  11.5 - 15.5 % Final   Platelets 09/07/2024 266  150 - 400 K/uL Final   nRBC 09/07/2024 0.0  0.0 - 0.2 % Final   Performed at [facility], [address], [place], [place] [address]   Opiates 09/07/2024 NONE DETECTED  NONE DETECTED Final   Cocaine 09/07/2024 POSITIVE (A)  NONE DETECTED Final   Benzodiazepines 09/07/2024 NONE DETECTED  NONE DETECTED Final   Amphetamines 09/07/2024 POSITIVE (A)  NONE DETECTED Final   Comment: (NOTE) Trazodone  is metabolized in vivo to several metabolites, including pharmacologically active m-CPP, which is excreted in the urine. Immunoassay screens for amphetamines and MDMA have potential cross-reactivity with these compounds and may provide false positive  results.     Tetrahydrocannabinol 09/07/2024 POSITIVE (A)  NONE DETECTED Final   Barbiturates 09/07/2024 NONE DETECTED  NONE DETECTED Final   Comment: (NOTE) DRUG SCREEN FOR MEDICAL PURPOSES ONLY.  IF CONFIRMATION IS NEEDED FOR ANY PURPOSE, NOTIFY LAB WITHIN 5 DAYS.  LOWEST DETECTABLE LIMITS FOR URINE DRUG SCREEN Drug Class                     Cutoff (ng/mL) Amphetamine and metabolites    1000 Barbiturate and metabolites     200 Benzodiazepine                 200 Opiates and metabolites        300 Cocaine and metabolites        300 THC                            50 Performed at [facility], [address] [place], [place] [address]     Allergies: Tylenol  [acetaminophen ], Motrin  [ibuprofen ], and Nsaids  Medications:  Facility Ordered Medications  Medication   alum & mag hydroxide-simeth (MAALOX/MYLANTA) 200-200-20 MG/5ML suspension 30 mL   magnesium  hydroxide (MILK OF MAGNESIA) suspension 30 mL   haloperidol  (HALDOL ) tablet 5 mg   And   diphenhydrAMINE  (BENADRYL ) capsule 50 mg   haloperidol  lactate (HALDOL ) injection 5 mg   And   diphenhydrAMINE  (BENADRYL ) injection 50 mg   And   LORazepam  (ATIVAN ) injection 2 mg   haloperidol  lactate (HALDOL ) injection 10 mg   And   diphenhydrAMINE  (BENADRYL ) injection 50 mg   And   LORazepam  (ATIVAN ) injection 2 mg   hydrOXYzine  (ATARAX ) tablet 25 mg   traZODone  (DESYREL ) tablet 50 mg   OLANZapine  (ZYPREXA ) tablet 5 mg   thiamine  (VITAMIN B1) tablet 100 mg   nicotine  (NICODERM CQ  - dosed in mg/24 hours) patch 14 mg   lisinopril  (ZESTRIL ) tablet 5 mg   OLANZapine  (ZYPREXA ) tablet 5 mg   PTA Medications  Medication Sig   lisinopril  (ZESTRIL ) 5 MG tablet Take 1 tablet (5 mg total) by mouth daily.   nicotine  (NICODERM CQ  - DOSED IN MG/24 HOURS) 14 mg/24hr patch Place 1 patch (14 mg total) onto the skin daily.   OLANZapine  (ZYPREXA ) 5 MG tablet Take 1 tablet (5 mg total) by mouth at bedtime.   thiamine  (VITAMIN B-1) 100 MG tablet Take 1 tablet (100 mg total) by mouth daily.  Medical Decision Making  Carlos French is a 33 y/o male with a psychiatric history of MDD; GAD; PTSD; Alcohol use disorder; Polysubstance abuse presented to [facility] via [facility] after calling the police because he was feeling scared that the cartel was after him and the FBI has him under surveillance.     Recommendations  Based on my evaluation the patient does not appear to  have an emergency medical condition. Patient recommended for inpatient treatment and will be admitted to [facility] for crisis management, safety and stabilization.  Lavel Rieman E Mont Jagoda, NP 12/07/2024  6:42 AM

## 2024-12-08 ENCOUNTER — Telehealth (HOSPITAL_COMMUNITY): Payer: Self-pay | Admitting: Pharmacy Technician

## 2024-12-08 ENCOUNTER — Other Ambulatory Visit (HOSPITAL_COMMUNITY): Payer: Self-pay

## 2024-12-08 ENCOUNTER — Encounter (HOSPITAL_COMMUNITY): Payer: Self-pay

## 2024-12-08 DIAGNOSIS — F1090 Alcohol use, unspecified, uncomplicated: Secondary | ICD-10-CM | POA: Insufficient documentation

## 2024-12-08 MED ORDER — NICOTINE POLACRILEX 2 MG MT GUM
2.0000 mg | CHEWING_GUM | OROMUCOSAL | Status: DC | PRN
  Administered 2024-12-08 – 2024-12-10 (×6): 2 mg via ORAL
  Filled 2024-12-08 (×2): qty 1

## 2024-12-08 MED ORDER — CHLORDIAZEPOXIDE HCL 25 MG PO CAPS
25.0000 mg | ORAL_CAPSULE | Freq: Once | ORAL | Status: AC
  Administered 2024-12-10: 16:00:00 25 mg via ORAL
  Filled 2024-12-08: qty 1

## 2024-12-08 MED ORDER — CHLORDIAZEPOXIDE HCL 25 MG PO CAPS: 25.0000 mg | ORAL_CAPSULE | Freq: Four times a day (QID) | ORAL | Status: DC | PRN

## 2024-12-08 MED ORDER — CHLORDIAZEPOXIDE HCL 25 MG PO CAPS
25.0000 mg | ORAL_CAPSULE | Freq: Two times a day (BID) | ORAL | Status: AC
  Administered 2024-12-09 – 2024-12-10 (×2): 25 mg via ORAL
  Filled 2024-12-08 (×2): qty 1

## 2024-12-08 MED ORDER — CHLORDIAZEPOXIDE HCL 25 MG PO CAPS
25.0000 mg | ORAL_CAPSULE | Freq: Three times a day (TID) | ORAL | Status: AC
  Administered 2024-12-08 – 2024-12-09 (×3): 25 mg via ORAL
  Filled 2024-12-08 (×3): qty 1

## 2024-12-08 MED ORDER — QUETIAPINE FUMARATE 50 MG PO TABS
50.0000 mg | ORAL_TABLET | Freq: Two times a day (BID) | ORAL | Status: DC
  Administered 2024-12-08 – 2024-12-09 (×3): 50 mg via ORAL
  Filled 2024-12-08 (×3): qty 1

## 2024-12-08 NOTE — Group Note (Signed)
 Date:  12/08/2024 Time:  10:13 AM  Group Topic/Focus: Recreational Therapy    Pt did not attend recreational therapy group  Carlos French 12/08/2024, 10:13 AM

## 2024-12-08 NOTE — Plan of Care (Signed)
  Problem: Education: Goal: Emotional status will improve Outcome: Progressing   Problem: Education: Goal: Verbalization of understanding the information provided will improve Outcome: Progressing   Problem: Education: Goal: Mental status will improve Outcome: Progressing

## 2024-12-08 NOTE — Group Note (Signed)
 Date:  12/08/2024 Time:  12:50 PM  Group Topic/Focus:  Emotional Wellness: explore and understand the deeper layers of anger by using the anger iceberg model. This metaphor helps us  recognize that anger, like an iceberg, often only shows a small portion of what's truly beneath the surface. By delving into this concept, participants will develop a more nuanced view of their emotional experiences, enhancing self-awareness and promoting healthier emotional expression. Physical Wellness: educate participants on the vital role that sleep plays in overall physical wellness by watching a TED Talk on the science of sleep and engaging in a thoughtful discussion afterward. The session will focus on how sleep impacts various aspects of health, including cognitive function, physical recovery, immune health, and emotional well-being, and how participants can incorporate better sleep practices into their lives to improve their physical wellness.  Participation Level:  Did Not Attend  Murphy Bundick R Jahid Weida 12/08/2024, 12:50 PM

## 2024-12-08 NOTE — Telephone Encounter (Signed)
 Patient Product/process Development Scientist completed.    The patient is insured through [employer] [place].     Ran test claim for Abilify Maintena 400 mg and the current 30 day co-pay is $4.00.  Ran test claim for Invega Sustenna 156 mg and the current 30 day co-pay is $4.00.  Ran test claim for Uzedy 75 mg and the current 30 day co-pay is $4.00.  This test claim was processed through [facility]- copay amounts may vary at other pharmacies due to pharmacy/plan contracts, or as the patient moves through the different stages of their insurance plan.     Reyes Sharps, CPHT Pharmacy Technician Patient Advocate Specialist Lead [facility] Patient Advocate Team Direct Number: [phone]  Fax: [phone]

## 2024-12-08 NOTE — Group Note (Signed)
 Date:  12/08/2024 Time:  9:39 AM  Group Topic/Focus:  Goals Group:   The focus of this group is to help patients establish daily goals to achieve during treatment and discuss how the patient can incorporate goal setting into their daily lives to aide in recovery. Orientation:   The focus of this group is to educate the patient on the purpose and policies of crisis stabilization and provide a format to answer questions about their admission.  The group details unit policies and expectations of patients while admitted.    Participation Level:  Did Not Attend   Lauris JONELLE Morales 12/08/2024, 9:39 AM

## 2024-12-08 NOTE — Progress Notes (Signed)
°   12/07/24 2110  Psych Admission Type (Psych Patients Only)  Admission Status Involuntary  Psychosocial Assessment  Patient Complaints Anxiety;Irritability;Substance abuse  Eye Contact Brief  Facial Expression Grimacing;Pained  Affect Anxious  Speech Logical/coherent  Interaction Assertive  Motor Activity Tremors  Appearance/Hygiene Disheveled  Behavior Characteristics Cooperative  Mood Anxious;Irritable  Thought Process  Coherency WDL  Content WDL  Delusions None reported or observed  Perception WDL  Hallucination None reported or observed  Judgment Poor  Confusion None  Danger to Self  Current suicidal ideation? Denies  Agreement Not to Harm Self Yes  Description of Agreement Verbal  Danger to Others  Danger to Others None reported or observed

## 2024-12-08 NOTE — BH IP Treatment Plan (Signed)
 Interdisciplinary Treatment and Diagnostic Plan Update  12/08/2024 Time of Session: 11:00 AM Carlos French MRN: [id number]  Principal Diagnosis: <principal problem not specified>  Secondary Diagnoses: Active Problems:   PTSD (post-traumatic stress disorder)   Alcohol use disorder   Current Medications:  Current Facility-Administered Medications  Medication Dose Route Frequency Provider Last Rate Last Admin   alum & mag hydroxide-simeth (MAALOX/MYLANTA) 200-200-20 MG/5ML suspension 30 mL  30 mL Oral Q4H PRN Tex Drilling, NP       chlordiazePOXIDE  (LIBRIUM ) capsule 25 mg  25 mg Oral TID Faunce, Alina, DO   25 mg at 12/08/24 1329   Followed by   [name] ON 12/09/2024] chlordiazePOXIDE  (LIBRIUM ) capsule 25 mg  25 mg Oral BID Faunce, Alina, DO       Followed by   [name] ON 12/10/2024] chlordiazePOXIDE  (LIBRIUM ) capsule 25 mg  25 mg Oral Once Faunce, Alina, DO       chlordiazePOXIDE  (LIBRIUM ) capsule 25 mg  25 mg Oral Q6H PRN Faunce, Alina, DO       haloperidol  (HALDOL ) tablet 5 mg  5 mg Oral TID PRN Tex Drilling, NP       And   diphenhydrAMINE  (BENADRYL ) capsule 50 mg  50 mg Oral TID PRN Tex Drilling, NP       haloperidol  lactate (HALDOL ) injection 5 mg  5 mg Intramuscular TID PRN Tex Drilling, NP       And   diphenhydrAMINE  (BENADRYL ) injection 50 mg  50 mg Intramuscular TID PRN Tex Drilling, NP       And   LORazepam  (ATIVAN ) injection 2 mg  2 mg Intramuscular TID PRN Tex Drilling, NP       haloperidol  lactate (HALDOL ) injection 10 mg  10 mg Intramuscular TID PRN Tex Drilling, NP       And   diphenhydrAMINE  (BENADRYL ) injection 50 mg  50 mg Intramuscular TID PRN Tex Drilling, NP       And   LORazepam  (ATIVAN ) injection 2 mg  2 mg Intramuscular TID PRN Tex Drilling, NP       hydrOXYzine  (ATARAX ) tablet 25 mg  25 mg Oral TID PRN Tex Drilling, NP   25 mg at 12/07/24 2111   hydrOXYzine  (ATARAX ) tablet 25 mg  25 mg Oral Q6H PRN Tex Drilling, NP       lisinopril   (ZESTRIL ) tablet 5 mg  5 mg Oral Daily Nkwenti, Doris, NP   5 mg at 12/08/24 9066   loperamide  (IMODIUM ) capsule 2-4 mg  2-4 mg Oral PRN Tex Drilling, NP       magnesium  hydroxide (MILK OF MAGNESIA) suspension 30 mL  30 mL Oral Daily PRN Tex Drilling, NP       multivitamin with minerals tablet 1 tablet  1 tablet Oral Daily Tex Drilling, NP   1 tablet at 12/08/24 9066   nicotine  (NICODERM CQ  - dosed in mg/24 hours) patch 14 mg  14 mg Transdermal Daily Nkwenti, Doris, NP   14 mg at 12/08/24 9066   ondansetron  (ZOFRAN -ODT) disintegrating tablet 4 mg  4 mg Oral Q6H PRN Tex Drilling, NP   4 mg at 12/08/24 9047   QUEtiapine  (SEROQUEL ) tablet 50 mg  50 mg Oral BID Faunce, Alina, DO   50 mg at 12/08/24 1329   thiamine  (Vitamin B-1) tablet 100 mg  100 mg Oral Daily Nkwenti, Doris, NP   100 mg at 12/08/24 0933   traZODone  (DESYREL ) tablet 50 mg  50 mg Oral QHS PRN Tex Drilling, NP  50 mg at 12/07/24 2111   PTA Medications: Medications Prior to Admission  Medication Sig Dispense Refill Last Dose/Taking   lisinopril  (ZESTRIL ) 5 MG tablet Take 1 tablet (5 mg total) by mouth daily. 30 tablet 0    nicotine  (NICODERM CQ  - DOSED IN MG/24 HOURS) 14 mg/24hr patch Place 1 patch (14 mg total) onto the skin daily. 28 patch 0    OLANZapine  (ZYPREXA ) 5 MG tablet Take 1 tablet (5 mg total) by mouth at bedtime. 30 tablet 0    thiamine  (VITAMIN B-1) 100 MG tablet Take 1 tablet (100 mg total) by mouth daily.       Patient Stressors:    Patient Strengths:    Treatment Modalities: Medication Management, Group therapy, Case management,  1 to 1 session with clinician, Psychoeducation, Recreational therapy.   Physician Treatment Plan for Primary Diagnosis: <principal problem not specified> Long Term Goal(s):     Short Term Goals: Ability to disclose and discuss suicidal ideas Ability to identify and develop effective coping behaviors will improve  Medication Management: Evaluate patient's response, side  effects, and tolerance of medication regimen.  Therapeutic Interventions: 1 to 1 sessions, Unit Group sessions and Medication administration.  Evaluation of Outcomes: Not Progressing  Physician Treatment Plan for Secondary Diagnosis: Active Problems:   PTSD (post-traumatic stress disorder)   Alcohol use disorder  Long Term Goal(s):     Short Term Goals: Ability to disclose and discuss suicidal ideas Ability to identify and develop effective coping behaviors will improve     Medication Management: Evaluate patient's response, side effects, and tolerance of medication regimen.  Therapeutic Interventions: 1 to 1 sessions, Unit Group sessions and Medication administration.  Evaluation of Outcomes: Not Progressing   RN Treatment Plan for Primary Diagnosis: <principal problem not specified> Long Term Goal(s): Knowledge of disease and therapeutic regimen to maintain health will improve  Short Term Goals: Ability to remain free from injury will improve, Ability to verbalize frustration and anger appropriately will improve, Ability to demonstrate self-control, Ability to participate in decision making will improve, Ability to verbalize feelings will improve, Ability to disclose and discuss suicidal ideas, Ability to identify and develop effective coping behaviors will improve, and Compliance with prescribed medications will improve  Medication Management: RN will administer medications as ordered by provider, will assess and evaluate patient's response and provide education to patient for prescribed medication. RN will report any adverse and/or side effects to prescribing provider.  Therapeutic Interventions: 1 on 1 counseling sessions, Psychoeducation, Medication administration, Evaluate responses to treatment, Monitor vital signs and CBGs as ordered, Perform/monitor CIWA, COWS, AIMS and Fall Risk screenings as ordered, Perform wound care treatments as ordered.  Evaluation of Outcomes: Not  Progressing   LCSW Treatment Plan for Primary Diagnosis: <principal problem not specified> Long Term Goal(s): Safe transition to appropriate next level of care at discharge, Engage patient in therapeutic group addressing interpersonal concerns.  Short Term Goals: Engage patient in aftercare planning with referrals and resources, Increase social support, Increase ability to appropriately verbalize feelings, Increase emotional regulation, Facilitate acceptance of mental health diagnosis and concerns, Facilitate patient progression through stages of change regarding substance use diagnoses and concerns, Identify triggers associated with mental health/substance abuse issues, and Increase skills for wellness and recovery  Therapeutic Interventions: Assess for all discharge needs, 1 to 1 time with Social worker, Explore available resources and support systems, Assess for adequacy in community support network, Educate family and significant other(s) on suicide prevention, Complete Psychosocial Assessment, Interpersonal group therapy.  Evaluation of Outcomes: Not Progressing   Progress in Treatment: Attending groups: No. Participating in groups: No. Taking medication as prescribed: Yes. Toleration medication: Yes. Family/Significant other contact made: No, will contact:  consents pending.  Patient understands diagnosis: Yes. Discussing patient identified problems/goals with staff: Yes. Medical problems stabilized or resolved: Yes. Denies suicidal/homicidal ideation: Yes. Issues/concerns per patient self-inventory: No. None reported.  New problem(s) identified: No, Describe:  None identified.  New Short Term/Long Term Goal(s): detox, medication management for mood stabilization; elimination of SI thoughts; development of comprehensive mental wellness/sobriety plan   Patient Goals:  Medication stabilization, continue with outpatient treatment.  Discharge Plan or Barriers: Patient recently  admitted. CSW will continue to follow and assess for appropriate referrals and possible discharge planning.    Reason for Continuation of Hospitalization: Depression Medication stabilization Withdrawal symptoms  Estimated Length of Stay: 3-5 days.  Last 3 [place] Suicide Severity Risk Score: Flowsheet Row [facility] Most recent reading at 12/07/2024  5:28 PM ED from 12/07/2024 in [facility] Most recent reading at 12/07/2024 10:47 AM Admission (Discharged) [facility] Most recent reading at 11/24/2024 12:05 PM  C-SSRS RISK CATEGORY High Risk No Risk High Risk    Last PHQ 2/9 Scores:     No data to display          Scribe for Treatment Team: Shontelle Muska  Nunez-Uva, LCSWA 12/08/2024 2:53 PM

## 2024-12-08 NOTE — Group Note (Signed)
 Date:  12/08/2024 Time:  3:53 PM  Group Topic/Focus: Occupational Therapy    Pt did not attend occupational therapy group  Carlos French 12/08/2024, 3:53 PM

## 2024-12-08 NOTE — Progress Notes (Signed)
°   12/08/24 0900  Psych Admission Type (Psych Patients Only)  Admission Status Involuntary  Psychosocial Assessment  Patient Complaints Anxiety;Irritability  Eye Contact Brief  Facial Expression Grimacing;Angry;Animated;Anxious  Affect Anxious  Speech Logical/coherent  Interaction Assertive  Motor Activity Tremors  Appearance/Hygiene Disheveled  Behavior Characteristics Cooperative;Appropriate to situation  Mood Anxious;Irritable  Aggressive Behavior  Targets Other (Comment) (na)  Type of Behavior Other (Comment) (na)  Effect No apparent injury  Thought Process  Coherency WDL  Content WDL  Delusions None reported or observed  Perception WDL  Hallucination None reported or observed  Judgment Poor  Confusion None  Danger to Self  Current suicidal ideation? Denies  Self-Injurious Behavior No self-injurious ideation or behavior indicators observed or expressed   Agreement Not to Harm Self Yes  Description of Agreement verbal  Danger to Others  Danger to Others None reported or observed
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
## 2024-12-08 NOTE — BHH Group Notes (Signed)
 BHH Group Notes:  (Nursing/MHT/Case Management/Adjunct)  Date:  12/08/2024  Time:  8:56 PM  Type of Therapy:  Wrap up group  Participation Level:  Did Not Attend  Participation Quality:    Affect:    Cognitive:    Insight:    Engagement in Group:    Modes of Intervention:    Summary of Progress/Problems:  Carlos French 12/08/2024, 8:56 PM

## 2024-12-08 NOTE — Progress Notes (Signed)
(  Sleep Hours) - 9.75 (Any PRNs that were needed, meds refused, or side effects to meds)- PRN vistaril  25 mg and trazodone  50 mg given at pt request. Pt also received 1 mg ativan  PRN for CIWA score 11. Pt refused dose of 5 mg zyprexa , stating I only take it once a day and they already gave it to me this morning at the last place (Any disturbances and when (visitation, over night)- None  (Concerns raised by the patient)- None  (SI/HI/AVH)- Denies SI/HI/AVH

## 2024-12-08 NOTE — Group Note (Signed)
 Recreation Therapy Group Note   Group Topic:Team Building  Group Date: 12/08/2024 Start Time: 0932 End Time: 1007 Facilitators: Zuleica Seith-McCall, LRT,CTRS Location: [address] Dayroom   Group Topic: Communication, Team Building, Problem Solving  Goal Area(s) Addresses:  Patient will effectively work with peer towards shared goal.  Patient will identify skills used to make activity successful.  Patient will identify how skills used during activity can be used to reach post d/c goals.   Behavioral Response:   Intervention: STEM Activity  Activity: Landing Pad. In teams of 3-5, patients were given 12 plastic drinking straws and an equal length of masking tape. Using the materials provided, patients were asked to build a landing pad to catch a golf ball dropped from approximately 5 feet in the air. All materials were required to be used by the team in their design. LRT facilitated post-activity discussion.  Education: Pharmacist, Community, Scientist, Physiological, Discharge Planning   Education Outcome: Acknowledges education/In group clarification offered/Needs additional education.    Affect/Mood: N/A   Participation Level: Did not attend    Clinical Observations/Individualized Feedback:    Plan: Continue to engage patient in RT group sessions 2-3x/week.   Wynetta Seith-McCall, LRT,CTRS 12/08/2024 11:56 AM
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
## 2024-12-08 NOTE — Plan of Care (Signed)
   Problem: Education: Goal: Emotional status will improve Outcome: Progressing Goal: Mental status will improve Outcome: Progressing Goal: Verbalization of understanding the information provided will improve Outcome: Progressing   Problem: Activity: Goal: Interest or engagement in activities will improve Outcome: Progressing

## 2024-12-08 NOTE — Group Note (Signed)
 Date:  12/08/2024 Time:  3:07 PM  Group Topic/Focus: Grief and Loss Group    Pt did not attend grief and loss group with the chaplain  Codee Bloodworth R Mattison Stuckey 12/08/2024, 3:07 PM

## 2024-12-09 MED ORDER — QUETIAPINE FUMARATE 50 MG PO TABS
50.0000 mg | ORAL_TABLET | ORAL | Status: DC
  Administered 2024-12-10 – 2024-12-11 (×2): 50 mg via ORAL
  Filled 2024-12-09 (×2): qty 1

## 2024-12-09 MED ORDER — NALTREXONE HCL 50 MG PO TABS
25.0000 mg | ORAL_TABLET | Freq: Every day | ORAL | Status: DC
  Administered 2024-12-09 – 2024-12-10 (×2): 25 mg via ORAL
  Filled 2024-12-09 (×2): qty 1

## 2024-12-09 MED ORDER — QUETIAPINE FUMARATE 100 MG PO TABS
100.0000 mg | ORAL_TABLET | Freq: Every day | ORAL | Status: DC
  Administered 2024-12-09 – 2024-12-10 (×2): 100 mg via ORAL
  Filled 2024-12-09 (×2): qty 1

## 2024-12-09 NOTE — Group Note (Signed)
 Date:  12/09/2024 Time:  4:50 PM  Group Topic/Focus: Ice Breaker A simple and effective icebreaker for adult group therapy is the [employer], where each participant shares one word that describes how they are feeling at the start of the session. This activity creates a low-pressure opportunity for self-expression, helps normalize a range of emotions, and encourages emotional awareness without requiring participants to disclose more than they are comfortable sharing. It also allows the facilitator to quickly gauge the groups emotional state and sets a supportive, respectful tone for the session, making it especially suitable for mental health groups involving anxiety, depression, or grief.    Participation Level:  Did Not Attend   Dolores CHRISTELLA Fredericks 12/09/2024, 4:50 PM

## 2024-12-09 NOTE — Plan of Care (Signed)
   Problem: Education: Goal: Emotional status will improve Outcome: Progressing Goal: Mental status will improve Outcome: Progressing Goal: Verbalization of understanding the information provided will improve Outcome: Progressing   Problem: Activity: Goal: Interest or engagement in activities will improve Outcome: Progressing

## 2024-12-09 NOTE — Group Note (Signed)
 Date:  12/09/2024 Time:  3:51 PM  Group Topic/Focus: Sleep Hygiene Dimensions of Wellness:   The focus of this group is to introduce the topic of wellness and discuss the role each dimension of wellness plays in total health.    Participation Level:  Active  Participation Quality:  Appropriate  Affect:  Appropriate  Cognitive:  Appropriate  Insight: Appropriate  Engagement in Group:  Engaged  Modes of Intervention:  Discussion  Additional Comments:  Pt engaged appropriately during group  Shanda D Marquavius Scaife 12/09/2024, 3:51 PM
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
## 2024-12-09 NOTE — Group Note (Signed)
 LCSW Group Therapy Note   Group Date: 12/09/2024 Start Time: 1100 End Time: 1200   Participation:  did not attend  Type of Therapy:  Group Therapy  Topic:  Healing Hearts:  A Safe Space for Grief     Objective:   The objective of this class is to create a compassionate environment where participants can process their grief, explore different stages of grief, and discover ways to honor their loved ones through personal rituals.  3 Goals: Provide a safe and supportive space where participants feel comfortable sharing their feelings and experiences of grief without judgment. Educate participants about the stages of grief and emphasize that there is no right way to grieve or a fixed timeline for healing. Introduce the concept of rituals as a means to process grief, allowing individuals to honor their loved ones in a personal and meaningful way.  Summary:  In Healing Hearts: A Safe Space for Grief, we explored the unique and personal journey of grief, emphasizing that everyone experiences it differently.  We discussed the five stages of grief (denial, anger, bargaining, depression, and acceptance), with the understanding that grief is not linear.  Rituals were introduced as a way to help cope with loss, offering comfort and connection through meaningful actions such as lighting candles or taking memory walks. Participants were encouraged to express their emotions, focus on self-care, and reflect on moments of gratitude for their loved ones, recognizing that healing is a process and there is no timeline for grief.  Therapeutic Modalities: Elements of CBT: Challenge thoughts, reframe beliefs, self-compassion Elements of DBT: Mindfulness, distress tolerance, emotion regulation Supportive Therapy:  Provide validation, foster a safe and supportive group environment, normalize grief    Tranesha Lessner O Emelda Kohlbeck, LCSWA 12/09/2024  12:28 PM

## 2024-12-09 NOTE — Progress Notes (Signed)
°   12/09/24 1000  Psych Admission Type (Psych Patients Only)  Admission Status Involuntary  Psychosocial Assessment  Patient Complaints Substance abuse  Eye Contact Fair  Facial Expression Flat  Affect Anxious  Speech Logical/coherent  Interaction Assertive  Motor Activity Slow  Appearance/Hygiene Unremarkable  Behavior Characteristics Cooperative;Calm  Mood Pleasant  Thought Process  Coherency WDL  Content WDL  Delusions None reported or observed  Perception WDL  Hallucination None reported or observed  Judgment Poor  Confusion None  Danger to Self  Current suicidal ideation? Denies  Agreement Not to Harm Self Yes  Description of Agreement verbal  Danger to Others  Danger to Others None reported or observed

## 2024-12-09 NOTE — Progress Notes (Signed)
°   12/08/24 2110  Psych Admission Type (Psych Patients Only)  Admission Status Involuntary  Psychosocial Assessment  Patient Complaints Substance abuse  Eye Contact Fair  Facial Expression Flat  Affect Anxious  Speech Logical/coherent  Interaction Assertive  Motor Activity Tremors  Appearance/Hygiene Unremarkable  Behavior Characteristics Cooperative;Calm  Mood Pleasant  Thought Process  Coherency WDL  Content WDL  Delusions None reported or observed  Perception WDL  Hallucination None reported or observed  Judgment Poor  Confusion None  Danger to Self  Current suicidal ideation? Denies  Agreement Not to Harm Self Yes  Description of Agreement Verbal  Danger to Others  Danger to Others None reported or observed

## 2024-12-09 NOTE — [facility] Suicide Risk Assessment (Signed)
 [facility] INPATIENT:  Family/Significant Other Suicide Prevention Education  Suicide Prevention Education:  Patient Refusal for Family/Significant Other Suicide Prevention Education: The patient Carlos French has refused to provide written consent for family/significant other to be provided Family/Significant Other Suicide Prevention Education during admission and/or prior to discharge.   Jenkins LULLA Primer 12/09/2024, 11:23 AM

## 2024-12-09 NOTE — Group Note (Signed)
 Date:  12/09/2024 Time:  11:29 AM  Group Topic/Focus: Holiday Music Holiday music therapy supports adult mental health by reducing stress and anxiety, improving mood, fostering emotional expression, and easing loneliness during a season that can intensify both joy and distress. Through guided listening, gentle singing, lyric discussion, or simple instrument play, adults can process emotions, access positive memories, and experience grounding and connection in a nonverbal, low-pressure way. When used with choice and cultural sensitivity, holiday music therapy is especially helpful for adults coping with depression, anxiety, grief, trauma, or social isolation, making it a valuable, trauma-informed therapeutic approach during the holidays.    Participation Level:  Did Not Attend  Sharelle laws 12/09/2024, 11:29 AM

## 2024-12-09 NOTE — [facility] Group Notes (Signed)
 Adult Psychoeducational Group Note  Date:  12/09/2024 Time:  8:58 PM  Group Topic/Focus:  Wrap-Up Group:   The focus of this group is to help patients review their daily goal of treatment and discuss progress on daily workbooks.  Participation Level:  Active  Participation Quality:  Appropriate  Affect:  Appropriate  Cognitive:  Appropriate  Insight: Appropriate  Engagement in Group:  Engaged  Modes of Intervention:  Discussion  Additional Comments:  Pt told that today was a good day on the unit, the highlight of which was waking up this morning. On the subject of short term goals, Pt mentioned being focused on discharge, which he felt ready for. Pt rated his day a 7 out of 10.  Carlos French 12/09/2024, 8:58 PM

## 2024-12-09 NOTE — Plan of Care (Signed)
  Problem: Education: Goal: Knowledge of [facility] Education information/materials will improve Outcome: Progressing Goal: Mental status will improve Outcome: Progressing   Problem: Activity: Goal: Interest or engagement in activities will improve Outcome: Progressing Goal: Sleeping patterns will improve Outcome: Progressing   Problem: Coping: Goal: Ability to verbalize frustrations and anger appropriately will improve Outcome: Progressing

## 2024-12-09 NOTE — Progress Notes (Signed)
 [facility] Inpatient Psychiatry Progress Note  Date: 12/09/2024 Patient: Carlos French MRN: [id number]  ASSESSMENT: This patient's presentation of intrusive negative thoughts, reexperiencing chronic unstable mood and symptoms of hyperarousal are most consistent with diagnosis of PTSD with comorbid AUD, though differential includes an unspecified mood disorder like bipolar 2 or cluster B traits.  Patient has reported angry outburst since taking Zyprexa  so we will discontinue this medication and start patient on Seroquel  daily.  May consider prazosin in the future for PTSD related nightmares, though patient does not want to be on more than 1 BP medication at this time.   12/16: CIWA scores have remained low since initiation of Librium  taper and VSS so we will continue this scheduled, and initiate naltrexone  for MAT of AUD.  Will initiate small titration of Seroquel  at night for further mood stabilization, and begin safe dispo planning.  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 Active Medical Issues #Nicotine  withdrawal - Patient in need of nicotine  replacement; nicotine  patch 14 mg / 24 hours ordered. Smoking cessation encouraged   #HTN - Continue home lisinopril  5 mg daily   Other as needed medications  Tylenol  650 mg every 6 hours as needed for pain Mylanta 30 mL every 4 hours as needed for indigestion Milk of magnesia 30 mL daily as needed for constipation  Risk Assessment: Patient  continues to require inpatient hospitalization for safety and stabilization of chronically unstable mood.  Discharge Planning: Barriers to Discharge: Medication management Estimated Length of Stay: 2-4 days Predicted Discharge Location: TBD, currently living in a hotel  INTERVAL HISTORY: Chart reviewed. No significant events overnight. On assessment today, the patient reports he is feeling much better, denies somatic complaints of withdrawal and is just wanting to get out of the hospital.  Discussed with patient that while I understand he is wanting to leave the hospital, he has had multiple recent psychiatric hospitalizations, so it would be beneficial to make sure patient this hospitalization is as successful as possible.  Patient voiced understanding, though says he does not need to be here.  Patient initially declined naltrexone  treatment for AUD, citing I do not need it, I will quit on my own, though says he will do what ever he needs to to get out of the hospital.  Discussed risks and benefits of MAT with patient, explaining his medication will help reduce craving for alcohol and diminish feelings of euphoria when alcohol is consumed.  Patient is agreeable as long as this is not the medication that makes you sick when you drink alcohol.  PRN's in the last 24 hours include trazodone  and Vistaril   Physical Examination:  Vitals and nursing note reviewed MSK: Normal gait and station  MENTAL STATUS EXAM:  Appearance: Disheveled, hair pushed out of the way   Behavior: Fair eye contact  Attitude: Cooperative  Speech: Normal volume, tone and rate  Mood: Much better  Affect: Congruent, full range  Thought Process: Linear, GD  Thought Content: WNL, focused on discharge  SI/HI: Denies  Perceptions: Denies AVH, does not appear to responding to internal stimuli  Judgement: Poor  Insight: Poor  Fund of Knowledge: WNL   Lab Results:  No visits with results within 1 Day(s) from this visit.   Latest known visit with results is:  Admission on 12/07/2024, Discharged on 12/07/2024  Component Date Value Ref Range Status   WBC 12/07/2024 6.9  4.0 - 10.5 K/uL Final   RBC 12/07/2024 4.86  4.22 - 5.81 MIL/uL Final   Hemoglobin 12/07/2024 15.9  13.0 - 17.0 g/dL Final   HCT 87/85/7974 45.7  39.0 - 52.0 % Final   MCV 12/07/2024 94.0  80.0 - 100.0 fL Final   MCH 12/07/2024 32.7  26.0 - 34.0 pg Final   MCHC 12/07/2024 34.8  30.0 - 36.0 g/dL Final   RDW 87/85/7974 12.2  11.5 - 15.5 % Final   Platelets 12/07/2024 347  150 - 400 K/uL Final   nRBC 12/07/2024 0.0  0.0 - 0.2 % Final   Neutrophils Relative % 12/07/2024 61  % Final   Neutro Abs 12/07/2024 4.2  1.7 - 7.7 K/uL Final   Lymphocytes Relative 12/07/2024 23  % Final   Lymphs Abs 12/07/2024 1.6  0.7 - 4.0 K/uL Final   Monocytes Relative 12/07/2024 10  % Final   Monocytes Absolute 12/07/2024 0.7  0.1 - 1.0 K/uL Final   Eosinophils Relative 12/07/2024 5  % Final   Eosinophils Absolute 12/07/2024 0.3  0.0 - 0.5 K/uL Final   Basophils Relative 12/07/2024 1  % Final   Basophils Absolute 12/07/2024 0.1  0.0 - 0.1 K/uL Final   Immature Granulocytes 12/07/2024 0  % Final   Abs Immature Granulocytes 12/07/2024 0.03  0.00 - 0.07 K/uL Final   Sodium 12/07/2024 136  135 - 145 mmol/L Final   Potassium 12/07/2024 3.9  3.5 - 5.1 mmol/L Final   Chloride 12/07/2024 100  98 - 111 mmol/L Final   CO2 12/07/2024 29  22 - 32 mmol/L Final   Glucose, Bld 12/07/2024 73  70 - 99 mg/dL Final   BUN 87/85/7974 11  6 - 20 mg/dL Final   Creatinine, Ser 12/07/2024 0.68  0.61 - 1.24 mg/dL Final   Calcium  12/07/2024 9.5  8.9 - 10.3 mg/dL Final   Total Protein 87/85/7974 7.9  6.5 - 8.1 g/dL Final   Albumin 87/85/7974 4.7  3.5 - 5.0 g/dL Final   AST 87/85/7974 19  15 - 41 U/L Final   ALT 12/07/2024 18  0 - 44 U/L Final   Alkaline Phosphatase 12/07/2024 79  38 - 126 U/L Final   Total Bilirubin 12/07/2024 0.5  0.0 - 1.2 mg/dL Final   GFR, Estimated 12/07/2024 >60   >60 mL/min Final   Anion gap 12/07/2024 7  5 - 15 Final   Alcohol, Ethyl (B) 12/07/2024 <15  <15 mg/dL Final   POC Amphetamine UR 12/07/2024 None Detected  NONE DETECTED (Cut Off Level 1000 ng/mL) Final   POC Secobarbital (BAR) 12/07/2024 None Detected  NONE DETECTED (Cut Off Level 300 ng/mL) Final   POC Buprenorphine (BUP) 12/07/2024 None Detected  NONE DETECTED (Cut Off Level 10 ng/mL) Final   POC Oxazepam (BZO) 12/07/2024 Positive (A)  NONE DETECTED (Cut Off Level 300 ng/mL) Final   POC Cocaine UR 12/07/2024 Positive (A)  NONE DETECTED (Cut Off Level 300 ng/mL) Final   POC Methamphetamine UR 12/07/2024 None Detected  NONE DETECTED (Cut Off Level 1000 ng/mL)  Final   POC Morphine 12/07/2024 None Detected  NONE DETECTED (Cut Off Level 300 ng/mL) Final   POC Methadone UR 12/07/2024 None Detected  NONE DETECTED (Cut Off Level 300 ng/mL) Final   POC Oxycodone UR 12/07/2024 None Detected  NONE DETECTED (Cut Off Level 100 ng/mL) Final   POC Marijuana UR 12/07/2024 Positive (A)  NONE DETECTED (Cut Off Level 50 ng/mL) Final   Vit D, 25-Hydroxy 12/07/2024 21.36 (L)  30 - 100 ng/mL Final   Vitamin B-12 12/07/2024 281  180 - 914 pg/mL Final     Vitals: Blood pressure 112/82, pulse 95, temperature 98.5 F (36.9 C), temperature source Oral, resp. rate 17, height 5' 6 (1.676 m), weight 52.3 kg, SpO2 100%.   Alfornia Light, DO PGY-1, Psychiatry Residency  12/09/2024, 7:20 AM

## 2024-12-09 NOTE — Group Note (Signed)
 Recreation Therapy Group Note   Group Topic:Animal Assisted Therapy   Group Date: 12/09/2024 Start Time: 0945 End Time: 1030 Facilitators: Maykel Reitter-McCall, LRT,CTRS Location: [address] [employer]   AAA/T Program Assumption of Risk Form signed by Patient/ or Parent Legal Guardian Yes  Patient understands his/her participation is voluntary Yes  Behavioral Response:    Education: Charity Fundraiser, Appropriate Animal Interaction   Education Outcome: Acknowledges education.    Clinical Observations/Individualized Feedback: Pet therapy didn't take place due medical situation with handlers older dog.      Plan: Continue to engage patient in RT group sessions 2-3x/week.   Nysir Fergusson-McCall, LRT,CTRS 12/09/2024 11:55 AM

## 2024-12-09 NOTE — Group Note (Signed)
 Date:  12/09/2024 Time:  4:08 PM  Group Topic/Focus: social work group on grief Social work with grieving adults in mental health settings focuses on supporting individuals as they cope with loss while addressing the emotional, psychological, and social impacts of grief. Social workers assess the nature of the loss, the clients coping abilities, support systems, and any co-occurring mental health concerns such as depression or anxiety. Using a strengths-based and culturally sensitive approach, they provide emotional support, normalize grief reactions, offer psychoeducation, and apply therapeutic interventions to help clients process their feelings and adjust to life changes. The goal is not to eliminate grief, but to promote healthy adaptation, resilience, and improved mental well-being.    Participation Level:  Active   Giacomo Valone M Shade Rivenbark 12/09/2024, 4:08 PM

## 2024-12-09 NOTE — Group Note (Signed)
 Date:  12/09/2024 Time:  9:59 AM  Group Topic/Focus: Goals orientation Goals Group:   The focus of this group is to help patients establish daily goals to achieve during treatment and discuss how the patient can incorporate goal setting into their daily lives to aide in recovery. Orientation:   The focus of this group is to educate the patient on the purpose and policies of crisis stabilization and provide a format to answer questions about their admission.  The group details unit policies and expectations of patients while admitted.    Participation Level:  Active  Participation Quality:  Appropriate  Affect:  Appropriate  Cognitive:  Appropriate  Insight: Appropriate  Engagement in Group:  Engaged  Modes of Intervention:  Discussion and Orientation  Additional Comments:    Dolores CHRISTELLA Fredericks 12/09/2024, 9:59 AM

## 2024-12-09 NOTE — Progress Notes (Signed)
(  Sleep Hours) - 8.75 (Any PRNs that were needed, meds refused, or side effects to meds)- PRN vistaril  25 mg and trazodone  50 mg given at pt request, no meds refused.  (Any disturbances and when (visitation, over night)- None  (Concerns raised by the patient)- None (SI/HI/AVH)- Denies SI/HI/AVH

## 2024-12-10 DIAGNOSIS — F840 Autistic disorder: Secondary | ICD-10-CM

## 2024-12-10 DIAGNOSIS — F3181 Bipolar II disorder: Secondary | ICD-10-CM

## 2024-12-10 MED ORDER — NALTREXONE HCL 50 MG PO TABS
50.0000 mg | ORAL_TABLET | Freq: Every day | ORAL | Status: DC
  Administered 2024-12-11: 08:00:00 50 mg via ORAL
  Filled 2024-12-10: qty 1

## 2024-12-10 NOTE — Plan of Care (Signed)

## 2024-12-10 NOTE — Group Note (Signed)
 Recreation Therapy Group Note   Group Topic:Communication  Group Date: 12/10/2024 Start Time: 0935 End Time: 0956 Facilitators: Parrie Rasco-McCall, LRT,CTRS Location: [address] Dayroom   Group Topic: Communication, Team Building, Problem Solving  Goal Area(s) Addresses:  Patient will effectively work with peer towards shared goal.  Patient will identify skills used to make activity successful.  Patient will identify how skills used during activity can be applied to reach post d/c goals.   Behavioral Response:   Intervention: STEM Activity- Glass Blower/designer  Activity: Tallest [employer]. In teams of 5-6, patients were given 11 craft pipe cleaners. Using the materials provided, patients were instructed to compete again the opposing team(s) to build the tallest free-standing structure from floor level. The activity was timed; difficulty increased by clinical research associate as production designer, theatre/television/film continued.  Systematically resources were removed with additional directions for example, placing one arm behind their back, working in silence, and shape stipulations. LRT facilitated post-activity discussion reviewing team processes and necessary communication skills involved in completion. Patients were encouraged to reflect how the skills utilized, or not utilized, in this activity can be incorporated to positively impact support systems post discharge.  Education: Pharmacist, Community, Scientist, Physiological, Discharge Planning   Education Outcome: Acknowledges education/In group clarification offered/Needs additional education.    Affect/Mood: N/A   Participation Level: Did not attend    Clinical Observations/Individualized Feedback:      Plan: Continue to engage patient in RT group sessions 2-3x/week.   Maryuri Warnke-McCall, LRT,CTRS 12/10/2024 12:37 PM

## 2024-12-10 NOTE — Progress Notes (Signed)
(  Sleep Hours) -8.25 (Any PRNs that were needed, meds refused, or side effects to meds)- Atarax ,nicotine  gum, and Trazodone . (Any disturbances and when (visitation, over night)-none (Concerns raised by the patient)- none (SI/HI/AVH)-denied

## 2024-12-10 NOTE — Plan of Care (Signed)
   Problem: Education: Goal: Emotional status will improve Outcome: Progressing

## 2024-12-10 NOTE — Group Note (Signed)
 Date:  12/10/2024 Time:  9:59 AM  Group Topic/Focus:  Goals Group:   The focus of this group is to help patients establish daily goals to achieve during treatment and discuss how the patient can incorporate goal setting into their daily lives to aide in recovery. Orientation:   The focus of this group is to educate the patient on the purpose and policies of crisis stabilization and provide a format to answer questions about their admission.  The group details unit policies and expectations of patients while admitted.    Participation Level:  Did Not Attend   Carlos French 12/10/2024, 9:59 AM

## 2024-12-10 NOTE — Progress Notes (Signed)
(  Sleep Hours) -8.75  (Any PRNs that were needed, meds refused, or side effects to meds)- hydroxyzine  25mg ; Trazodone  50mg   (Any disturbances and when (visitation, over night)-none  (Concerns raised by the patient)- none  (SI/HI/AVH)-denies

## 2024-12-10 NOTE — Progress Notes (Signed)
 Patient presents: appropriate and cooperative. Patient states he is ready to discharge.   SI/HI/AVH: Denies   Plan: Denies   Groups attended: 2/6   Appetite: Adequate. Attended meals.   Sleep: No sleep disturbances reported.   PRNS: Maalox due to indigestion which provided relief. Nicotine  gum.   Disturbances: No disturbances. Patient remains cooperative in milieu.    Questions/concerns: No further questions or concerns.  CIWA: 1   VS: BP 118/80 (BP Location: Right Arm)   Pulse 94   Temp 97.6 F (36.4 C) (Oral)   Resp 18   Ht 5' 6 (1.676 m)   Wt 52.3 kg   SpO2 100%   BMI 18.59 kg/m

## 2024-12-10 NOTE — Discharge Summary (Signed)
 Physician Discharge Summary Note  Patient:  Carlos French is an 33 y.o., male MRN:  [id number] DOB:  November 07, 1991 Patient phone:  [phone] (home)  Patient address:   [address] [place] [address],   Date of Admission:  12/07/2024 Date of Discharge: 12/11/2024  Reason for Admission: Carlos French is a 33 y.o. male  with a past psychiatric history of PTSD, MDD, Bipolar, GAD, polysubstance use disorder. Patient initially arrived to [facility] on 12/14 for SI and was admitted to [facility] under IVC on 12/14 for acute suicidal or self-harming behaviors. PMHx is significant for HTN.   Discharge Diagnoses:  Principal Problem:   Bipolar disorder, unspecified (HCC) Active Problems:   Cocaine use disorder, severe, dependence (HCC)   Borderline personality disorder (HCC)   PTSD (post-traumatic stress disorder)   Alcohol use disorder  Hospital Course:   Upon admission, the patient presented with dysthymia and alcohol withdrawal and was continued on/started on the following medications: Seroquel , Librium . Initial medication adjustments included titration of Seroquel , and baseline labs and imaging were reviewed, with the following abnormalities noted for follow-up: n/a.  The treatment plan was reviewed daily in interdisciplinary meetings. Ongoing medication management resulted in further adjustments: Initiation of naltrexone , with final dosing optimized prior to discharge. The patient denies any side effects to prescribed psychiatric medication. The patient engaged in group programming focusing on coping skills, problem-solving, relaxation techniques, and also received supportive psychotherapy.  Over the course of hospitalization, the patient acclimated to the unit milieu and showed steady improvement in mood, affect, sleep, appetite, and participation in programming. Daily self-inventories reflected ongoing symptom reduction and increased treatment engagement.  Leading up to  discharge, the patient reported stable mood, denied suicidal or homicidal ideation for more than 48 hours, and denied hallucinations or other psychotic symptoms. On day of discharge, patient reported overall stabilization of his mood. Patient will be returning home to stay with his mother for the evening before flying to [place]  for work tomorrow. The patient expressed motivation to continue prescribed medications and follow-up care, noting good response of target symptoms of mood destabilization and overall benefit from hospitalization. The patient was able to verbalize an individualized safety plan prior to discharge.  Mental Status Exam: Appearance: Appropriate for environment Behavior: Good eye contact, no psychomotor agitation noted Attitude: Polite, calm Speech: Normal volume, tone, rate Mood: Great Affect: Congruent Thought Process: LLGD Thought Content: WNL SI/HI: Denies Perceptions: Denies AVH, not responding to internal stimuli Judgement: Fair Insight: Fair Fund of Knowledge: WNL  Physical Exam  General: Pleasant, well-appearing young white male. No acute distress. Pulmonary: Normal effort on room air.  Skin: No obvious rash or lesions. Neuro: A&Ox3.No focal deficits. MSK: Normal gait and station.  Review of Systems  No reported symptoms  Blood pressure 111/68, pulse (!) 107, temperature 97.6 F (36.4 C), temperature source Oral, resp. rate 14, height 5' 6 (1.676 m), weight 52.3 kg, SpO2 98%. Body mass index is 18.59 kg/m.  Assets  Assets: Resilience, social support, physical health, housing  Tobacco Use History[1] Tobacco Cessation:  A prescription for an FDA-approved tobacco cessation medication provided at discharge  Metabolic Disorder Labs:  Lab Results  Component Value Date   HGBA1C 5.1 11/24/2024   MPG 100 11/24/2024   Lab Results  Component Value Date   PROLACTIN 32.3 (H) 02/10/2016   Lab Results  Component Value Date   CHOL 183 11/24/2024   TRIG 91  11/24/2024   HDL 74 11/24/2024  CHOLHDL 2.5 11/24/2024   VLDL 18 11/24/2024   LDLCALC 91 11/24/2024   LDLCALC 99 02/10/2016    Is patient on multiple antipsychotic therapies at discharge:  No   Has Patient had three or more failed trials of antipsychotic monotherapy by history:  No  Recommended Plan for Multiple Antipsychotic Therapies: NA   Allergies as of 12/11/2024       Reactions   Tylenol  [acetaminophen ] Other (See Comments)   Elevated liver enzymes   Motrin  [ibuprofen ] Other (See Comments)   Hx GI bleed   Nsaids Other (See Comments)   Hx GI bleed        Medication List     STOP taking these medications    nicotine  14 mg/24hr patch Commonly known as: NICODERM CQ  - dosed in mg/24 hours   OLANZapine  5 MG tablet Commonly known as: ZYPREXA        TAKE these medications      Indication  hydrOXYzine  25 MG tablet Commonly known as: ATARAX  Take 1 tablet (25 mg total) by mouth 3 (three) times daily as needed for anxiety.  Indication: Feeling Anxious   lisinopril  5 MG tablet Commonly known as: ZESTRIL  Take 1 tablet (5 mg total) by mouth daily.  Indication: High Blood Pressure   naltrexone  50 MG tablet Commonly known as: DEPADE Take 1 tablet (50 mg total) by mouth daily.  Indication: Abuse or Misuse of Alcohol   nicotine  polacrilex 2 MG gum Commonly known as: NICORETTE  Take 1 each (2 mg total) by mouth as needed for smoking cessation.  Indication: Nicotine  Addiction   QUEtiapine  Fumarate 150 MG Tabs Take 100 mg by mouth at bedtime.  Indication: Major Depressive Disorder   thiamine  100 MG tablet Commonly known as: Vitamin B-1 Take 1 tablet (100 mg total) by mouth daily.  Indication: Deficiency of Vitamin B1        Follow-up Information     [facility] Follow up on 12/15/2024.   Why: You have a hospital follow up appointment for therapy and medication management services on 12/15/24 at 2 pm and it will  be virtual.  You also have a Psych Evaluation  for medication scheduled for 12/22/24 at 8:30 am and it will be virtual also. Contact information: [address]  [address] [place] [address] [phone]                Discharge recommendations:  Continue psychiatric medications as prescribed. Follow up with outpatient psychiatric provider and primary care physician as scheduled. Abstain from or limit alcohol, illicit drugs, and tobacco due to their negative impact on psychiatric and medical health. In the event of worsening symptoms, the patient is instructed to call the national crisis hotline (988), 911, or go the the closest ED for appropriate evaluation and treatment of symptoms.   Alfornia Light, DO, PGY-1 12/11/2024, 10:52 AM       [1]  Social History Tobacco Use  Smoking Status Every Day   Types: Cigarettes  Smokeless Tobacco Former

## 2024-12-10 NOTE — Group Note (Signed)
 Date:  12/10/2024 Time:  3:58 PM  Group Topic/Focus: Motivation/Inspiration/Stages of Change  Stages of Change:   The focus of this group is to explain the stages of change and help patients identify changes they want to make upon discharge.    Participation Level:  Active  Participation Quality:  Appropriate  Affect:  Appropriate  Cognitive:  Appropriate  Insight: Appropriate  Engagement in Group:  Engaged  Modes of Intervention:  Discussion  Additional Comments:  Pt engaged appropriately during group.  Derrius Furtick D Subhan Hoopes 12/10/2024, 3:58 PM

## 2024-12-10 NOTE — Group Note (Signed)
 Date:  12/10/2024 Time:  12:45 PM  Group Topic/Focus: Pharmacy    Pt did not attend pharmacy group  Carlos French R [name] [name] 12/10/2024, 12:45 PM

## 2024-12-10 NOTE — Progress Notes (Signed)
 [facility] Inpatient Psychiatry Progress Note  Date: 12/10/2024 Patient: Carlos French MRN: [id number]  ASSESSMENT: This patient's presentation of intrusive negative thoughts, reexperiencing chronic unstable mood and symptoms of hyperarousal are most consistent with diagnosis of PTSD with comorbid AUD, though differential includes an unspecified mood disorder like bipolar 2 or cluster B traits.  Patient has reported angry outburst since taking Zyprexa  so we will discontinue this medication and start patient on Seroquel  daily.  May consider prazosin in the future for PTSD related nightmares, though patient does not want to be on more than 1 BP medication at this time.   12/17: CIWA scores continue to remain low, today is last day of Librium  taper.  Will continue Seroquel  at current dose given patient's reported mood regulation, and increase naltrexone  to 50 mg daily for AUD.  Plan to discharge patient tomorrow to friend's home before he goes to [place]  for work on Friday, and already has medication management and therapy appointments in place from previous admission.  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 Active Medical Issues #Nicotine  withdrawal - Patient in need of nicotine  replacement; nicotine  patch 14 mg / 24 hours ordered. Smoking cessation encouraged   #HTN - Continue home lisinopril  5 mg daily   Other as needed medications  Tylenol  650 mg every 6 hours as needed for  pain Mylanta 30 mL every 4 hours as needed for indigestion Milk of magnesia 30 mL daily as needed for constipation  Risk Assessment: Patient continues to require inpatient hospitalization for safety and stabilization of chronically unstable mood.  Discharge Planning: Barriers to Discharge: Medication management Estimated Length of Stay: 2-4 days Predicted Discharge Location: TBD, currently living in a hotel  INTERVAL HISTORY: Chart reviewed. No significant events overnight. On assessment today, the patient reports he is feeling much better, denies somatic complaints of withdrawal and endorses good sleep and appetite.  Discussed with patient where he would be going upon discharge, and he says that he would be staying at a buddies place until Friday morning, when he will leave for [place]  for work reasons.  Patient continues to deny SI/HI/AVH, and denies side effects from medications, and notes much benefit with mood regulation after initiation of Seroquel .  PRN's in the last 24 hours include trazodone , nicotine  gum and Vistaril   Physical Examination:  Vitals and nursing note reviewed MSK: Normal gait and station  MENTAL STATUS EXAM:  Appearance: Appropriate grooming and hygiene, long hair  Behavior: Good eye contact  Attitude: Cooperative  Speech: Normal volume, tone and rate  Mood: Really good  Affect: Congruent, full range  Thought Process: Linear, GD  Thought Content: WNL  SI/HI: Denies  Perceptions: Denies AVH, does not appear to responding to internal stimuli  Judgement: Poor  Insight: Poor  Fund of Knowledge: WNL   Lab Results:  No visits with results within 1 Day(s) from this visit.  Latest known visit with results is:  Admission on 12/07/2024, Discharged  on 12/07/2024  Component Date Value Ref Range Status   WBC 12/07/2024 6.9  4.0 - 10.5 K/uL Final   RBC 12/07/2024 4.86  4.22 - 5.81 MIL/uL Final   Hemoglobin 12/07/2024 15.9  13.0 - 17.0 g/dL Final   HCT 87/85/7974  45.7  39.0 - 52.0 % Final   MCV 12/07/2024 94.0  80.0 - 100.0 fL Final   MCH 12/07/2024 32.7  26.0 - 34.0 pg Final   MCHC 12/07/2024 34.8  30.0 - 36.0 g/dL Final   RDW 87/85/7974 12.2  11.5 - 15.5 % Final   Platelets 12/07/2024 347  150 - 400 K/uL Final   nRBC 12/07/2024 0.0  0.0 - 0.2 % Final   Neutrophils Relative % 12/07/2024 61  % Final   Neutro Abs 12/07/2024 4.2  1.7 - 7.7 K/uL Final   Lymphocytes Relative 12/07/2024 23  % Final   Lymphs Abs 12/07/2024 1.6  0.7 - 4.0 K/uL Final   Monocytes Relative 12/07/2024 10  % Final   Monocytes Absolute 12/07/2024 0.7  0.1 - 1.0 K/uL Final   Eosinophils Relative 12/07/2024 5  % Final   Eosinophils Absolute 12/07/2024 0.3  0.0 - 0.5 K/uL Final   Basophils Relative 12/07/2024 1  % Final   Basophils Absolute 12/07/2024 0.1  0.0 - 0.1 K/uL Final   Immature Granulocytes 12/07/2024 0  % Final   Abs Immature Granulocytes 12/07/2024 0.03  0.00 - 0.07 K/uL Final   Sodium 12/07/2024 136  135 - 145 mmol/L Final   Potassium 12/07/2024 3.9  3.5 - 5.1 mmol/L Final   Chloride 12/07/2024 100  98 - 111 mmol/L Final   CO2 12/07/2024 29  22 - 32 mmol/L Final   Glucose, Bld 12/07/2024 73  70 - 99 mg/dL Final   BUN 87/85/7974 11  6 - 20 mg/dL Final   Creatinine, Ser 12/07/2024 0.68  0.61 - 1.24 mg/dL Final   Calcium  12/07/2024 9.5  8.9 - 10.3 mg/dL Final   Total Protein 87/85/7974 7.9  6.5 - 8.1 g/dL Final   Albumin 87/85/7974 4.7  3.5 - 5.0 g/dL Final   AST 87/85/7974 19  15 - 41 U/L Final   ALT 12/07/2024 18  0 - 44 U/L Final   Alkaline Phosphatase 12/07/2024 79  38 - 126 U/L Final   Total Bilirubin 12/07/2024 0.5  0.0 - 1.2 mg/dL Final   GFR, Estimated 12/07/2024 >60  >60 mL/min Final   Anion gap 12/07/2024 7  5 - 15 Final   Alcohol, Ethyl (B) 12/07/2024 <15  <15 mg/dL Final   POC Amphetamine UR 12/07/2024 None Detected  NONE DETECTED (Cut Off Level 1000 ng/mL) Final   POC Secobarbital (BAR) 12/07/2024 None Detected  NONE DETECTED (Cut Off Level 300  ng/mL) Final   POC Buprenorphine (BUP) 12/07/2024 None Detected  NONE DETECTED (Cut Off Level 10 ng/mL) Final   POC Oxazepam (BZO) 12/07/2024 Positive (A)  NONE DETECTED (Cut Off Level 300 ng/mL) Final   POC Cocaine UR 12/07/2024 Positive (A)  NONE DETECTED (Cut Off Level 300 ng/mL) Final   POC Methamphetamine UR 12/07/2024 None Detected  NONE DETECTED (Cut Off Level 1000 ng/mL) Final   POC Morphine 12/07/2024 None Detected  NONE DETECTED (Cut Off Level 300 ng/mL) Final   POC Methadone UR 12/07/2024 None Detected  NONE DETECTED (Cut Off Level 300 ng/mL) Final   POC Oxycodone UR 12/07/2024 None Detected  NONE DETECTED (Cut Off Level 100 ng/mL) Final   POC Marijuana UR 12/07/2024 Positive (A)  NONE DETECTED (Cut Off Level 50 ng/mL) Final   Vit D, 25-Hydroxy 12/07/2024 21.36 (L)  30 - 100 ng/mL Final   Vitamin B-12 12/07/2024 281  180 - 914 pg/mL Final     Vitals: Blood pressure 123/82, pulse 80, temperature 97.7 F (36.5 C), temperature source Oral, resp. rate 18, height 5' 6 (1.676 m), weight 52.3 kg, SpO2 100%.   Alfornia Light, DO PGY-1, Psychiatry Residency  12/10/2024, 7:30 AM
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
## 2024-12-10 NOTE — Plan of Care (Signed)
   Problem: Education: Goal: Knowledge of [facility] [facility] Education information/materials will improve Outcome: Progressing Goal: Emotional status will improve Outcome: Progressing Goal: Mental status will improve Outcome: Progressing

## 2024-12-10 NOTE — [facility] Group Notes (Signed)
 [facility] Group Notes:  (Nursing/MHT/Case Management/Adjunct)  Date:  12/10/2024  Time:  9:43 PM  Type of Therapy:  NA group  Participation Level:  Active  Participation Quality:  Appropriate  Affect:  Appropriate  Cognitive:  Appropriate  Insight:  Appropriate  Engagement in Group:  Engaged  Modes of Intervention:  Education  Summary of Progress/Problems:Attended NA group  Carlos French 12/10/2024, 9:43 PM

## 2024-12-10 NOTE — Group Note (Signed)
 Date:  12/10/2024 Time:  10:13 AM  Group Topic/Focus: Recreational Therapy    Pt did not attend recreational therapy group  Samy Ryner R Raiyah Speakman 12/10/2024, 10:13 AM

## 2024-12-10 NOTE — Group Note (Signed)
 Date:  12/10/2024 Time:  3:21 PM  Group Topic/Focus: Spiritual Wellness with Chaplain    Pt did attend spiritual wellness group with the chaplain  Myrene Bougher R Sumner Boesch 12/10/2024, 3:21 PM

## 2024-12-11 DIAGNOSIS — F603 Borderline personality disorder: Secondary | ICD-10-CM

## 2024-12-11 DIAGNOSIS — F1090 Alcohol use, unspecified, uncomplicated: Secondary | ICD-10-CM

## 2024-12-11 DIAGNOSIS — F1420 Cocaine dependence, uncomplicated: Secondary | ICD-10-CM

## 2024-12-11 DIAGNOSIS — F4310 Post-traumatic stress disorder, unspecified: Secondary | ICD-10-CM

## 2024-12-11 DIAGNOSIS — F319 Bipolar disorder, unspecified: Principal | ICD-10-CM

## 2024-12-11 MED ORDER — NALTREXONE HCL 50 MG PO TABS: 50.0000 mg | ORAL_TABLET | Freq: Every day | ORAL | 0 refills | Status: AC

## 2024-12-11 MED ORDER — QUETIAPINE FUMARATE 150 MG PO TABS: 100.0000 mg | ORAL_TABLET | Freq: Every day | ORAL | 0 refills | Status: AC

## 2024-12-11 MED ORDER — HYDROXYZINE HCL 25 MG PO TABS: 25.0000 mg | ORAL_TABLET | Freq: Three times a day (TID) | ORAL | 0 refills | Status: AC | PRN

## 2024-12-11 MED ORDER — NICOTINE POLACRILEX 2 MG MT GUM: 2.0000 mg | CHEWING_GUM | OROMUCOSAL | 0 refills | Status: AC | PRN

## 2024-12-11 NOTE — Group Note (Signed)
 Date:  12/11/2024 Time:  10:04 AM  Group Topic/Focus: Goals group  Patients were given two worksheets: a goals worksheet and a list of 50 positive traits. Patients participated in an icebreaker by sharing their name, a desired Christmas gift, and identifying positive traits they align with. They were encouraged to share their responses and goals with the group. The group focused on promoting expanded thinking, social interaction, and positivity.    Participation Level:  Active  Participation Quality:  Appropriate, Attentive, and Sharing  Affect:  Appropriate  Cognitive:  Alert and Appropriate  Insight: Good and Improving  Engagement in Group:  Engaged and Improving  Modes of Intervention:  Discussion, Exploration, Socialization, and Support  Additional Comments:  Pt attended and actively participated in goals group.  Kristi HERO Sylwia Cuervo 12/11/2024, 10:04 AM

## 2024-12-11 NOTE — Progress Notes (Signed)
 Pt discharged to lobby with taxi voucher. Pt was stable and appreciative at that time. All papers and prescriptions were given and valuables returned. Suicide safety plan completed.  Verbal understanding expressed. Denies SI/HI and A/VH. Pt given opportunity to express concerns and ask questions.

## 2024-12-11 NOTE — Progress Notes (Signed)
°   12/11/24 0800  Psych Admission Type (Psych Patients Only)  Admission Status Involuntary  Psychosocial Assessment  Patient Complaints None  Eye Contact Fair  Facial Expression Animated  Affect Appropriate to circumstance  Speech Logical/coherent  Interaction Assertive  Motor Activity Slow  Appearance/Hygiene Unremarkable  Behavior Characteristics Cooperative;Appropriate to situation  Mood Anxious;Pleasant  Thought Process  Coherency WDL  Content WDL  Delusions None reported or observed  Perception WDL  Hallucination None reported or observed  Judgment Impaired  Confusion None  Danger to Self  Current suicidal ideation? Denies  Danger to Others  Danger to Others None reported or observed

## 2024-12-11 NOTE — Progress Notes (Signed)
°  [facility] Adult Case Management Discharge Plan :  Will you be returning to the same living situation after discharge:  Yes,  Patient will be discharged to [address] [place]  At discharge, do you have transportation home?: No. Do you have the ability to pay for your medications: Yes,  Medicaid  Release of information consent forms completed and in the chart;  Patient's signature needed at discharge.  Patient to Follow up at:  Follow-up Information     [facility] Follow up on 12/15/2024.   Why: You have a hospital follow up appointment for therapy and medication management services on 12/15/24 at 2 pm and it will  be virtual.  You also have a Psych Evaluation for medication scheduled for 12/22/24 at 8:30 am and it will be virtual also. Contact information: [address]  [address] [place] [address] [phone]                 Next level of care provider has access to [facility] Hospital Link:yes  Safety Planning and Suicide Prevention discussed: Yes,  Completed with the patient due to his refusal to sign consent form.     Has patient been referred to the Quitline?: Patient refused referral for treatment  Patient has been referred for addiction treatment: Yes, the patient will follow up with an outpatient provider for substance use disorder. Psychiatrist/APP: appointment made and Therapist: appointment made  Roselyn GORMAN Lento, LCSW 12/11/2024, 8:41 AM

## 2024-12-17 NOTE — Progress Notes (Signed)
 Spiritual care group facilitated by Chaplain Rockie Sofia, [facility]  Group focused on topic of strength. Group members reflected on what thoughts and feelings emerge when they hear this topic. They then engaged in facilitated dialog around how strength is present in their lives. This dialog focused on representing what strength had been to them in their lives (images and patterns given) and what they saw as helpful in their life now (what they needed / wanted).  Activity drew on narrative framework.  Patient Progress: Carlos French attended group and actively engaged and participated in group conversation and activities.
